# Patient Record
Sex: Male | Born: 1955 | Race: White | Hispanic: No | Marital: Married | Smoking: Never smoker
Health system: Southern US, Community
[De-identification: ages and names within clinical notes are randomized; demographics above are authoritative.]

## PROBLEM LIST (undated history)

## (undated) DIAGNOSIS — E785 Hyperlipidemia, unspecified: Secondary | ICD-10-CM

## (undated) DIAGNOSIS — M542 Cervicalgia: Secondary | ICD-10-CM

## (undated) DIAGNOSIS — K219 Gastro-esophageal reflux disease without esophagitis: Secondary | ICD-10-CM

## (undated) DIAGNOSIS — I200 Unstable angina: Secondary | ICD-10-CM

## (undated) DIAGNOSIS — M5136 Other intervertebral disc degeneration, lumbar region: Secondary | ICD-10-CM

## (undated) DIAGNOSIS — I2510 Atherosclerotic heart disease of native coronary artery without angina pectoris: Secondary | ICD-10-CM

## (undated) DIAGNOSIS — R079 Chest pain, unspecified: Secondary | ICD-10-CM

## (undated) DIAGNOSIS — R519 Headache, unspecified: Secondary | ICD-10-CM

## (undated) DIAGNOSIS — N182 Chronic kidney disease, stage 2 (mild): Secondary | ICD-10-CM

## (undated) DIAGNOSIS — I5031 Acute diastolic (congestive) heart failure: Secondary | ICD-10-CM

## (undated) DIAGNOSIS — I509 Heart failure, unspecified: Secondary | ICD-10-CM

## (undated) DIAGNOSIS — I5032 Chronic diastolic (congestive) heart failure: Secondary | ICD-10-CM

## (undated) DIAGNOSIS — K2970 Gastritis, unspecified, without bleeding: Secondary | ICD-10-CM

## (undated) DIAGNOSIS — M545 Low back pain, unspecified: Secondary | ICD-10-CM

## (undated) DIAGNOSIS — D510 Vitamin B12 deficiency anemia due to intrinsic factor deficiency: Secondary | ICD-10-CM

## (undated) DIAGNOSIS — M51369 Other intervertebral disc degeneration, lumbar region without mention of lumbar back pain or lower extremity pain: Secondary | ICD-10-CM

## (undated) DIAGNOSIS — R58 Hemorrhage, not elsewhere classified: Secondary | ICD-10-CM

## (undated) DIAGNOSIS — I480 Paroxysmal atrial fibrillation: Secondary | ICD-10-CM

## (undated) DIAGNOSIS — I10 Essential (primary) hypertension: Secondary | ICD-10-CM

## (undated) DIAGNOSIS — F411 Generalized anxiety disorder: Secondary | ICD-10-CM

## (undated) DIAGNOSIS — N189 Chronic kidney disease, unspecified: Secondary | ICD-10-CM

## (undated) DIAGNOSIS — E119 Type 2 diabetes mellitus without complications: Secondary | ICD-10-CM

## (undated) DIAGNOSIS — I219 Acute myocardial infarction, unspecified: Secondary | ICD-10-CM

## (undated) DIAGNOSIS — N200 Calculus of kidney: Secondary | ICD-10-CM

## (undated) DIAGNOSIS — Z6826 Body mass index (BMI) 26.0-26.9, adult: Secondary | ICD-10-CM

## (undated) DIAGNOSIS — Z87442 Personal history of urinary calculi: Secondary | ICD-10-CM

## (undated) HISTORY — DX: Hyperlipidemia, unspecified: E78.5

## (undated) HISTORY — DX: Acute diastolic (congestive) heart failure: I50.31

## (undated) HISTORY — PX: LUMBAR FUSION: SHX111

## (undated) HISTORY — DX: Atherosclerotic heart disease of native coronary artery without angina pectoris: I25.10

## (undated) HISTORY — DX: Body mass index (BMI) 26.0-26.9, adult: Z68.26

## (undated) HISTORY — PX: KNEE ARTHROSCOPY: SUR90

## (undated) HISTORY — DX: Calculus of kidney: N20.0

## (undated) HISTORY — DX: Paroxysmal atrial fibrillation: I48.0

## (undated) HISTORY — PX: CARDIAC CATHETERIZATION: SHX172

## (undated) HISTORY — DX: Vitamin B12 deficiency anemia due to intrinsic factor deficiency: D51.0

## (undated) HISTORY — DX: Chest pain, unspecified: R07.9

## (undated) HISTORY — DX: Gastritis, unspecified, without bleeding: K29.70

## (undated) HISTORY — DX: Type 2 diabetes mellitus without complications: E11.9

## (undated) HISTORY — DX: Chronic kidney disease, unspecified: N18.9

## (undated) HISTORY — DX: Unstable angina: I20.0

## (undated) HISTORY — DX: Other intervertebral disc degeneration, lumbar region: M51.36

## (undated) HISTORY — DX: Low back pain, unspecified: M54.50

## (undated) HISTORY — PX: CORONARY ARTERY BYPASS GRAFT: SHX141

## (undated) HISTORY — DX: Heart failure, unspecified: I50.9

## (undated) HISTORY — DX: Hemorrhage, not elsewhere classified: R58

## (undated) HISTORY — DX: Headache, unspecified: R51.9

## (undated) HISTORY — DX: Other intervertebral disc degeneration, lumbar region without mention of lumbar back pain or lower extremity pain: M51.369

## (undated) HISTORY — DX: Generalized anxiety disorder: F41.1

## (undated) HISTORY — PX: LUMBAR PERITONEAL SHUNT: SHX1984

## (undated) HISTORY — PX: SHOULDER ARTHROSCOPY: SHX128

## (undated) HISTORY — DX: Essential (primary) hypertension: I10

## (undated) HISTORY — DX: Cervicalgia: M54.2

---
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
## 1998-03-09 ENCOUNTER — Encounter: Payer: Self-pay | Admitting: Family Medicine

## 1998-03-09 ENCOUNTER — Ambulatory Visit (HOSPITAL_COMMUNITY): Admission: RE | Admit: 1998-03-09 | Discharge: 1998-03-09 | Payer: Self-pay | Admitting: Family Medicine

## 1998-06-07 ENCOUNTER — Encounter: Payer: Self-pay | Admitting: Emergency Medicine

## 1998-06-07 ENCOUNTER — Emergency Department (HOSPITAL_COMMUNITY): Admission: EM | Admit: 1998-06-07 | Discharge: 1998-06-07 | Payer: Self-pay | Admitting: Emergency Medicine

## 1998-06-12 ENCOUNTER — Emergency Department (HOSPITAL_COMMUNITY): Admission: EM | Admit: 1998-06-12 | Discharge: 1998-06-12 | Payer: Self-pay | Admitting: Emergency Medicine

## 1998-08-10 ENCOUNTER — Ambulatory Visit (HOSPITAL_COMMUNITY): Admission: RE | Admit: 1998-08-10 | Discharge: 1998-08-10 | Payer: Self-pay | Admitting: Neurosurgery

## 1998-08-10 ENCOUNTER — Encounter: Payer: Self-pay | Admitting: Neurosurgery

## 1998-08-19 ENCOUNTER — Encounter: Payer: Self-pay | Admitting: Emergency Medicine

## 1998-08-19 ENCOUNTER — Emergency Department (HOSPITAL_COMMUNITY): Admission: EM | Admit: 1998-08-19 | Discharge: 1998-08-20 | Payer: Self-pay | Admitting: Emergency Medicine

## 2000-03-20 ENCOUNTER — Encounter: Payer: Self-pay | Admitting: Emergency Medicine

## 2000-03-20 ENCOUNTER — Inpatient Hospital Stay (HOSPITAL_COMMUNITY): Admission: EM | Admit: 2000-03-20 | Discharge: 2000-03-22 | Payer: Self-pay | Admitting: Emergency Medicine

## 2000-03-28 ENCOUNTER — Encounter: Payer: Self-pay | Admitting: Emergency Medicine

## 2000-03-28 ENCOUNTER — Inpatient Hospital Stay (HOSPITAL_COMMUNITY): Admission: EM | Admit: 2000-03-28 | Discharge: 2000-03-30 | Payer: Self-pay | Admitting: Emergency Medicine

## 2000-04-10 ENCOUNTER — Encounter: Payer: Self-pay | Admitting: Emergency Medicine

## 2000-04-10 ENCOUNTER — Inpatient Hospital Stay (HOSPITAL_COMMUNITY): Admission: EM | Admit: 2000-04-10 | Discharge: 2000-04-13 | Payer: Self-pay | Admitting: Emergency Medicine

## 2000-04-11 ENCOUNTER — Encounter: Payer: Self-pay | Admitting: *Deleted

## 2000-04-12 ENCOUNTER — Encounter: Payer: Self-pay | Admitting: *Deleted

## 2000-05-24 ENCOUNTER — Inpatient Hospital Stay (HOSPITAL_COMMUNITY): Admission: EM | Admit: 2000-05-24 | Discharge: 2000-05-29 | Payer: Self-pay | Admitting: Emergency Medicine

## 2000-05-25 ENCOUNTER — Encounter: Payer: Self-pay | Admitting: Emergency Medicine

## 2000-05-26 ENCOUNTER — Encounter: Payer: Self-pay | Admitting: Cardiology

## 2000-05-29 ENCOUNTER — Encounter: Payer: Self-pay | Admitting: Interventional Cardiology

## 2000-07-07 ENCOUNTER — Encounter: Payer: Self-pay | Admitting: Emergency Medicine

## 2000-07-07 ENCOUNTER — Inpatient Hospital Stay (HOSPITAL_COMMUNITY): Admission: EM | Admit: 2000-07-07 | Discharge: 2000-07-14 | Payer: Self-pay | Admitting: Emergency Medicine

## 2000-07-08 ENCOUNTER — Encounter: Payer: Self-pay | Admitting: Thoracic Surgery (Cardiothoracic Vascular Surgery)

## 2000-07-09 ENCOUNTER — Encounter: Payer: Self-pay | Admitting: Thoracic Surgery (Cardiothoracic Vascular Surgery)

## 2000-07-10 ENCOUNTER — Encounter: Payer: Self-pay | Admitting: Thoracic Surgery (Cardiothoracic Vascular Surgery)

## 2000-07-11 ENCOUNTER — Encounter: Payer: Self-pay | Admitting: Thoracic Surgery (Cardiothoracic Vascular Surgery)

## 2000-11-28 ENCOUNTER — Encounter: Payer: Self-pay | Admitting: Emergency Medicine

## 2000-11-28 ENCOUNTER — Inpatient Hospital Stay (HOSPITAL_COMMUNITY): Admission: EM | Admit: 2000-11-28 | Discharge: 2000-12-04 | Payer: Self-pay | Admitting: Emergency Medicine

## 2001-01-12 ENCOUNTER — Encounter: Payer: Self-pay | Admitting: Emergency Medicine

## 2001-01-12 ENCOUNTER — Inpatient Hospital Stay (HOSPITAL_COMMUNITY): Admission: EM | Admit: 2001-01-12 | Discharge: 2001-01-14 | Payer: Self-pay | Admitting: Emergency Medicine

## 2001-03-05 ENCOUNTER — Emergency Department (HOSPITAL_COMMUNITY): Admission: EM | Admit: 2001-03-05 | Discharge: 2001-03-05 | Payer: Self-pay | Admitting: Emergency Medicine

## 2001-03-26 ENCOUNTER — Ambulatory Visit (HOSPITAL_COMMUNITY): Admission: RE | Admit: 2001-03-26 | Discharge: 2001-03-26 | Payer: Self-pay | Admitting: Family Medicine

## 2001-03-26 ENCOUNTER — Encounter: Payer: Self-pay | Admitting: Family Medicine

## 2001-04-25 ENCOUNTER — Encounter: Payer: Self-pay | Admitting: Emergency Medicine

## 2001-04-25 ENCOUNTER — Inpatient Hospital Stay (HOSPITAL_COMMUNITY): Admission: EM | Admit: 2001-04-25 | Discharge: 2001-04-29 | Payer: Self-pay

## 2001-04-27 ENCOUNTER — Encounter: Payer: Self-pay | Admitting: Cardiovascular Disease

## 2001-06-08 ENCOUNTER — Encounter: Payer: Self-pay | Admitting: Emergency Medicine

## 2001-06-09 ENCOUNTER — Inpatient Hospital Stay (HOSPITAL_COMMUNITY): Admission: EM | Admit: 2001-06-09 | Discharge: 2001-06-11 | Payer: Self-pay | Admitting: Emergency Medicine

## 2002-01-21 ENCOUNTER — Inpatient Hospital Stay (HOSPITAL_COMMUNITY): Admission: EM | Admit: 2002-01-21 | Discharge: 2002-01-23 | Payer: Self-pay | Admitting: Emergency Medicine

## 2002-01-21 ENCOUNTER — Encounter: Payer: Self-pay | Admitting: Interventional Cardiology

## 2002-02-10 ENCOUNTER — Encounter: Payer: Self-pay | Admitting: Interventional Cardiology

## 2002-02-10 ENCOUNTER — Inpatient Hospital Stay (HOSPITAL_COMMUNITY): Admission: EM | Admit: 2002-02-10 | Discharge: 2002-02-12 | Payer: Self-pay | Admitting: Emergency Medicine

## 2002-02-11 ENCOUNTER — Encounter: Payer: Self-pay | Admitting: Interventional Cardiology

## 2002-10-01 ENCOUNTER — Emergency Department (HOSPITAL_COMMUNITY): Admission: AD | Admit: 2002-10-01 | Discharge: 2002-10-01 | Payer: Self-pay | Admitting: Emergency Medicine

## 2002-10-01 ENCOUNTER — Encounter: Payer: Self-pay | Admitting: Emergency Medicine

## 2003-01-24 ENCOUNTER — Emergency Department (HOSPITAL_COMMUNITY): Admission: EM | Admit: 2003-01-24 | Discharge: 2003-01-24 | Payer: Self-pay | Admitting: Emergency Medicine

## 2003-01-27 ENCOUNTER — Emergency Department (HOSPITAL_COMMUNITY): Admission: EM | Admit: 2003-01-27 | Discharge: 2003-01-27 | Payer: Self-pay | Admitting: Emergency Medicine

## 2003-02-14 ENCOUNTER — Inpatient Hospital Stay (HOSPITAL_COMMUNITY): Admission: EM | Admit: 2003-02-14 | Discharge: 2003-02-15 | Payer: Self-pay | Admitting: Emergency Medicine

## 2003-04-09 ENCOUNTER — Inpatient Hospital Stay (HOSPITAL_COMMUNITY): Admission: EM | Admit: 2003-04-09 | Discharge: 2003-04-13 | Payer: Self-pay | Admitting: Emergency Medicine

## 2003-04-16 ENCOUNTER — Inpatient Hospital Stay (HOSPITAL_COMMUNITY): Admission: EM | Admit: 2003-04-16 | Discharge: 2003-04-20 | Payer: Self-pay | Admitting: *Deleted

## 2003-05-31 ENCOUNTER — Ambulatory Visit (HOSPITAL_COMMUNITY): Admission: RE | Admit: 2003-05-31 | Discharge: 2003-05-31 | Payer: Self-pay | Admitting: Endocrinology

## 2003-07-04 ENCOUNTER — Emergency Department (HOSPITAL_COMMUNITY): Admission: EM | Admit: 2003-07-04 | Discharge: 2003-07-04 | Payer: Self-pay | Admitting: Emergency Medicine

## 2005-01-21 IMAGING — CR DG CHEST 1V PORT
1 series · 1 of 1 positions shown · non-contrast
Comparison: 02/10/2002.

CLINICAL DATA: Chest pain.  
 CHEST, PORTABLE, ONE VIEW ? 02/14/2003

[view not recorded]
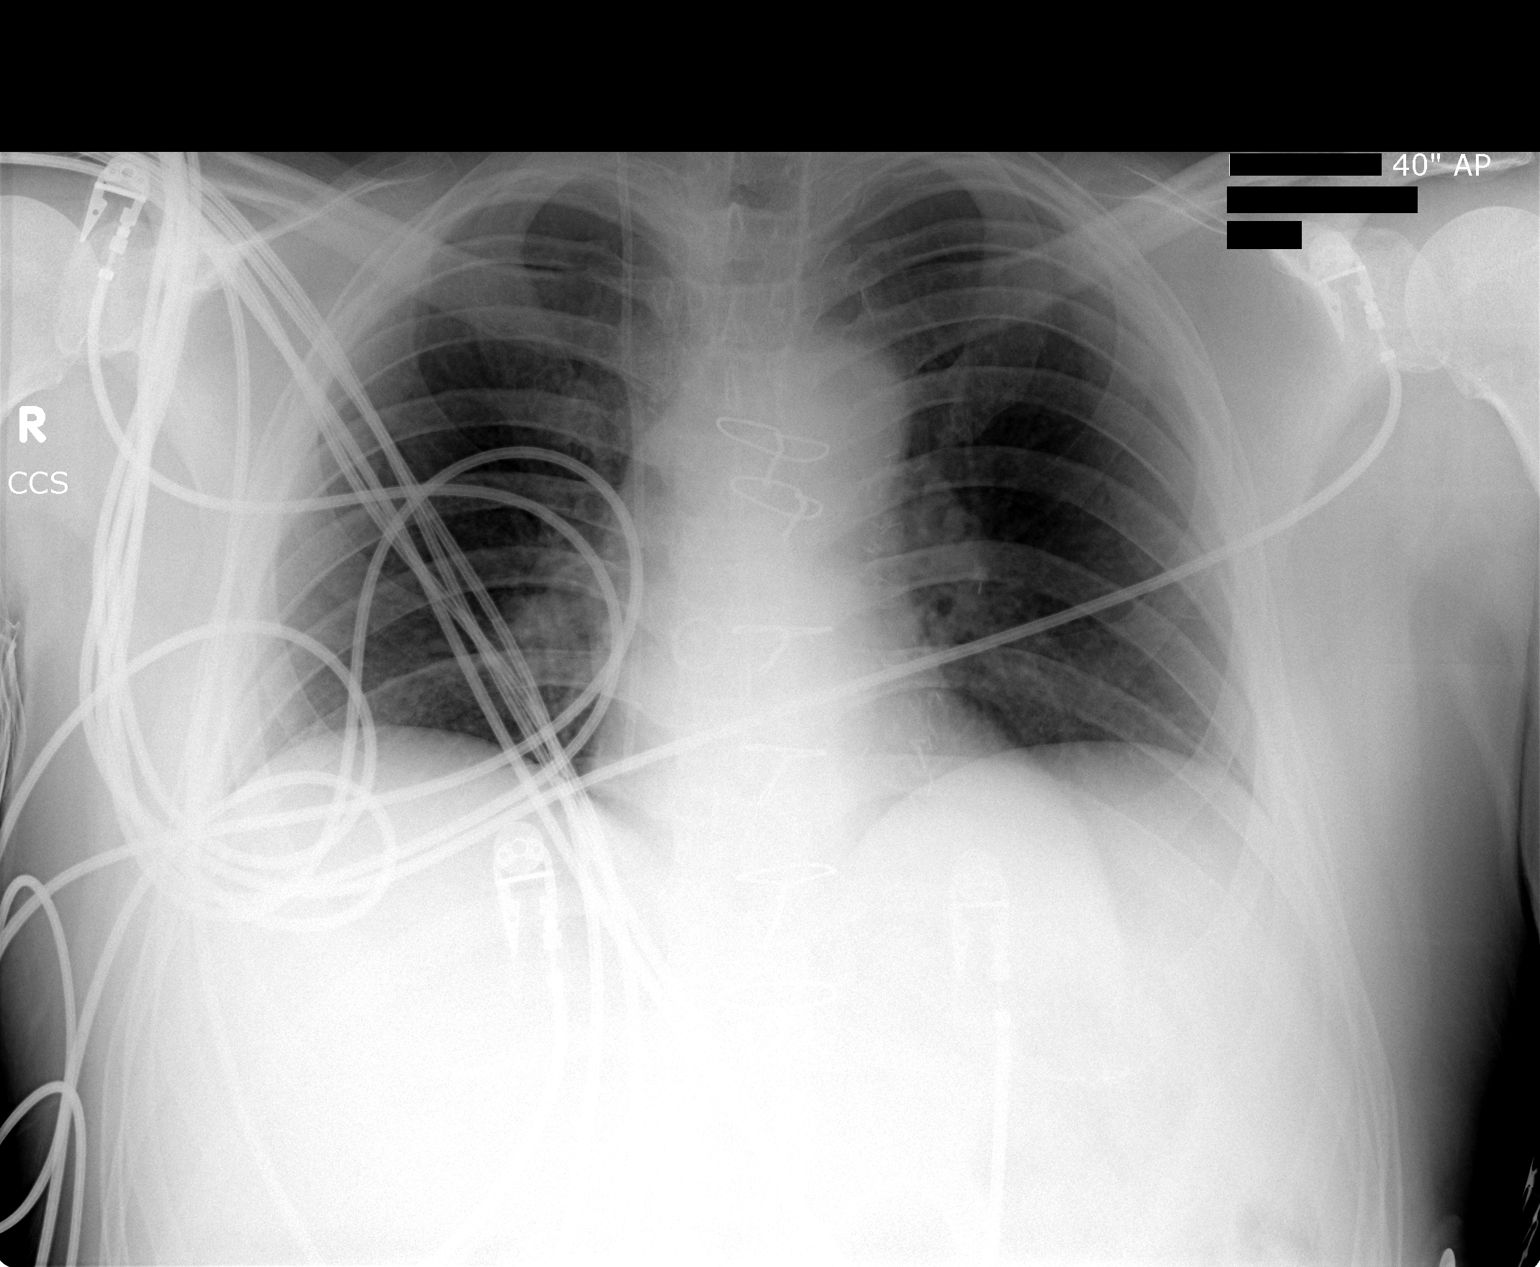

[1 of 1 positions shown; findings below may reference images not displayed]

Heart and mediastinal contours are within normal limits.  The patient is status post CABG.  Central line tip is in the upper right atrium.  Low lung volumes are noted without focal airspace opacity.
 IMPRESSION
 No acute disease.

## 2010-01-29 ENCOUNTER — Encounter: Payer: Self-pay | Admitting: Endocrinology

## 2012-03-12 DIAGNOSIS — I160 Hypertensive urgency: Secondary | ICD-10-CM

## 2012-03-12 HISTORY — DX: Hypertensive urgency: I16.0

## 2013-10-21 DIAGNOSIS — N201 Calculus of ureter: Secondary | ICD-10-CM | POA: Insufficient documentation

## 2015-01-09 HISTORY — PX: ABLATION: SHX5711

## 2015-01-22 DIAGNOSIS — Z79899 Other long term (current) drug therapy: Secondary | ICD-10-CM | POA: Insufficient documentation

## 2015-04-13 DIAGNOSIS — E861 Hypovolemia: Secondary | ICD-10-CM | POA: Insufficient documentation

## 2016-08-04 DIAGNOSIS — R509 Fever, unspecified: Secondary | ICD-10-CM | POA: Insufficient documentation

## 2016-08-04 DIAGNOSIS — Z8669 Personal history of other diseases of the nervous system and sense organs: Secondary | ICD-10-CM | POA: Insufficient documentation

## 2017-07-10 DIAGNOSIS — Z79899 Other long term (current) drug therapy: Secondary | ICD-10-CM | POA: Insufficient documentation

## 2017-07-10 DIAGNOSIS — M5126 Other intervertebral disc displacement, lumbar region: Secondary | ICD-10-CM | POA: Insufficient documentation

## 2018-01-14 DIAGNOSIS — M5116 Intervertebral disc disorders with radiculopathy, lumbar region: Secondary | ICD-10-CM | POA: Insufficient documentation

## 2018-03-31 ENCOUNTER — Telehealth: Payer: Self-pay

## 2018-03-31 NOTE — Telephone Encounter (Signed)
Notes on file.  

## 2018-06-24 ENCOUNTER — Telehealth: Payer: Self-pay | Admitting: *Deleted

## 2018-06-24 NOTE — Telephone Encounter (Signed)
REFERRAL SENT TO SCHEDULING AND NOTES ON FILE FROM [place] DR. MATTHEW JANIK [phone].

## 2018-09-09 NOTE — Progress Notes (Signed)
Cardiology Office Note:    Date:  09/10/2018   ID:  Terry Nelson, DOB 20-Jul-1955, MRN [id number]  PCP:  Jiles Harold, PA-C  Cardiologist:  No primary care provider on file.   Referring MD: Valentino Saxon, MD   Chief Complaint  Patient presents with  . Coronary Artery Disease    History of Present Illness:    Terry Nelson is a 63 y.o. male with a hx of CAD, CABG in 2002 by Dr. Dorris Fetch, PAF, Htn, HL, who is referred for cardiology evaluation and longitudinal management by Dr. Mosetta Anis.  Terry Nelson is here to establish with cardiology since he will be living both here and in [place].  I have seen him in the past prior to his move to [place].  He had coronary bypass surgery in [place] and subsequently has had multiple stents placed in [place].  His most recent heart catheterization performed in June did not lead to any intervention.  I do not have that data.  He is asymptomatic today.  He has not used nitroglycerin since June.  He denies orthopnea, PND, and claudication.  He has not had a stroke.  Terry Nelson had coronary bypass surgery in 2002 by Dr. Andrey Spearman and received a LIMA to the LAD, SVG to PDA, and SVG to OM.  Both grafts are occluded and the patient has had multiple stents to the LAD, most recently in 2017.  Other cardiac issues include paroxysmal atrial fibrillation (he is status post ablation in 2017), diastolic heart failure with most recent cardiac catheterization demonstrating LVEDP of 30, hyperlipidemia, and significant diabetes mellitus.  Past Medical History:  Diagnosis Date  . Anxiety state   . Arteriosclerosis of coronary artery   . Body mass index (bmi) 26.0-26.9, adult   . Chest pain   . CHF (congestive heart failure) (HCC)   . Chronic renal impairment   . DDD (degenerative disc disease), lumbar   . DM (diabetes mellitus) (HCC)   . Gastritis   . Headache   . Hemorrhage   . Hyperlipidemia   . Hypertension   . Kidney stones   . Lumbago   .  Neck pain   . Paroxysmal A-fib (HCC)   . Pernicious anemia   . Unstable angina [facility])     Past Surgical History:  Procedure Laterality Date  . ABLATION  2017  . CARDIAC CATHETERIZATION    . CORONARY ARTERY BYPASS GRAFT      Current Medications: Current Meds  Medication Sig  . AMLODIPINE BESYLATE PO Take 5 mg by mouth daily.  Marland Kitchen aspirin EC 81 MG tablet Take 81 mg by mouth daily.  Marland Kitchen atorvastatin (LIPITOR) 80 MG tablet Take 80 mg by mouth daily.  . furosemide (LASIX) 40 MG tablet 40 mg. 1/2 TABLET BY MOUTH DAILY  . gabapentin (NEURONTIN) 300 MG capsule Take 900 mg by mouth 2 (two) times daily.   Marland Kitchen glipiZIDE (GLUCOTROL XL) 10 MG 24 hr tablet Take 10 mg by mouth 2 (two) times daily.  . Insulin Glargine (BASAGLAR KWIKPEN [place]) Inject 24 Units/mL into the skin daily.   . isosorbide mononitrate (IMDUR) 60 MG 24 hr tablet Take 120 mg by mouth daily.  . magnesium oxide (MAG-OX) 400 MG tablet 400 mg. TAKE 2 TABLETS BY MOUTH THREE TIMES DAILY  . METOPROLOL TARTRATE PO Take 100 mg by mouth 3 (three) times daily.  . nitroGLYCERIN (NITROSTAT) 0.4 MG SL tablet Place 0.4 mg under the tongue every 5 (five) minutes as needed for chest pain.  Marland Kitchen  oxyCODONE-acetaminophen (PERCOCET) 10-325 MG tablet Take 1 tablet by mouth every 4 (four) hours as needed for pain.  . pantoprazole (PROTONIX) 40 MG tablet Take 40 mg by mouth daily.  . prasugrel (EFFIENT) 10 MG TABS tablet Take 10 mg by mouth daily.  . promethazine (PHENERGAN) 25 MG tablet Take 25 mg by mouth 2 (two) times daily as needed for nausea or vomiting.  Marland Kitchen spironolactone (ALDACTONE) 25 MG tablet Take 25 mg by mouth daily.  Marland Kitchen tiZANidine (ZANAFLEX) 4 MG tablet Take 4 mg by mouth every 6 (six) hours as needed for muscle spasms.     Allergies:   Metoclopramide and Penicillins   Social History   Socioeconomic History  . Marital status: Married    Spouse name: Not on file  . Number of children: Not on file  . Years of education: Not on file  . Highest  education level: Not on file  Occupational History  . Not on file  Social Needs  . Financial resource strain: Not on file  . Food insecurity    Worry: Not on file    Inability: Not on file  . Transportation needs    Medical: Not on file    Non-medical: Not on file  Tobacco Use  . Smoking status: Never Smoker  . Smokeless tobacco: Never Used  Substance and Sexual Activity  . Alcohol use: Not on file  . Drug use: Not on file  . Sexual activity: Not on file    Comment: MARRIED  Lifestyle  . Physical activity    Days per week: Not on file    Minutes per session: Not on file  . Stress: Not on file  Relationships  . Social Herbalist on phone: Not on file    Gets together: Not on file    Attends religious service: Not on file    Active member of club or organization: Not on file    Attends meetings of clubs or organizations: Not on file    Relationship status: Not on file  Other Topics Concern  . Not on file  Social History Narrative  . Not on file     Family History: The patient's family history includes Cancer - Other in his mother; Diabetes in his father and mother; Heart disease in an other family member.  ROS:   Please see the history of present illness.    Partially compliant with therapy.  Does not smoke.  Anxiety and depression.  All other systems reviewed and are negative.  EKGs/Labs/Other Studies Reviewed:    The following studies were reviewed today: Review of data from [facility] follows: We will review voluminous records and record pertinent data.  Internal loop recorder:  Coronary angiogram June 2020: Coronary Angiography: The left main coronary artery was large in caliber and trifurcated into the LAD and LCX and RI.<20% stenosis, diffuse plaque.  The LAD gave rise to 2 small diagonals across the anterolateral wall. Long stented segment (many stents) from proximal to apical LAD. Diffuse mild-moderate in-stent LAD re-stenosis. Apical segment  that underwent PCI with cutting balloon 04/2017 remains patent. Distal stent edge 70% focal stenosis, this is a <2.76mm vessel at the apex.UNCHANGED vs IMPROVED.  The LCX gave rise to 3 OM branches across the lateral wall. OM1 small vessel with diffuse up to 90% stenosis with TIMI 1 flow. OM2 and OM3 with proximal 70-80% lesions and competitive flow from SVG.  Very small caliber RI with diffuse moderate disease.  The RCA  was a dominant artery and supplied both the PDA and PLB. Diffuse up to 40-50% disease throughout with patent PDA stents.  SVG to OM1 to OM2: widely patent good runoff.   LIMA previously shown to be atretic    EKG:  EKG sinus rhythm, short PR interval, inferior Q waves.  Nonspecific T wave flattening.  Recent Labs: No results found for requested labs within last 8760 hours.  Recent Lipid Panel No results found for: CHOL, TRIG, HDL, CHOLHDL, VLDL, LDLCALC, LDLDIRECT  Physical Exam:    VS:  BP (!) 152/82   Pulse 68   Ht 5\' 7"  (1.702 m)   Wt 162 lb 12.8 oz (73.8 kg)   SpO2 96%   BMI 25.50 kg/m     Wt Readings from Last 3 Encounters:  09/10/18 162 lb 12.8 oz (73.8 kg)     GEN: Slender, mast.. No acute distress HEENT: Normal NECK: No JVD. LYMPHATICS: No lymphadenopathy CARDIAC:  RRR without murmur, gallop, or edema. VASCULAR:  Normal Pulses. No bruits. RESPIRATORY:  Clear to auscultation without rales, wheezing or rhonchi  ABDOMEN: Soft, non-tender, non-distended, No pulsatile mass, MUSCULOSKELETAL: No deformity  SKIN: Warm and dry NEUROLOGIC:  Alert and oriented x 3 PSYCHIATRIC:  Normal affect   ASSESSMENT:    1. Coronary artery disease involving native coronary artery of native heart without angina pectoris   2. PAF (paroxysmal atrial fibrillation) (HCC)   3. Essential hypertension   4. Hyperlipidemia LDL goal <70   5. Chest pain of uncertain etiology   6. Educated About Covid-19 Virus Infection    PLAN:    In order of problems listed above:   1. Complex cardiovascular history with multivessel bypass and subsequent multiple stent implantation.  Secondary risk prevention discussed in detail. 2. Prior history of A. fib.  No clinical recurrences 3. Blood pressure is elevated today.  He feels this is a combination of anxiety and stress related to coming into our office.  He does better than this at home.  Target blood pressure less than 130/80 mmHg. 4. Target LDL less than 70. 5. No recent chest pain episodes.  Most recent cath June 2020. 6. Social distancing, masking, and handwashing.   Overall education and awareness concerning primary/secondary risk prevention was discussed in detail: LDL less than 70, hemoglobin A1c less than 7, blood pressure target less than 130/80 mmHg, >150 minutes of moderate aerobic activity per week, avoidance of smoking, weight control (via diet and exercise), and continued surveillance/management of/for obstructive sleep apnea.    Medication Adjustments/Labs and Tests Ordered: Current medicines are reviewed at length with the patient today.  Concerns regarding medicines are outlined above.  Orders Placed This Encounter  Procedures  . EKG 12-Lead   No orders of the defined types were placed in this encounter.   There are no Patient Instructions on file for this visit.   Signed, Lesleigh NoeHenry W Emalie Mcwethy III, MD  09/10/2018 2:59 PM    [facility]

## 2018-09-10 ENCOUNTER — Encounter: Payer: Self-pay | Admitting: Interventional Cardiology

## 2018-09-10 ENCOUNTER — Ambulatory Visit (INDEPENDENT_AMBULATORY_CARE_PROVIDER_SITE_OTHER): Payer: 59 | Admitting: Interventional Cardiology

## 2018-09-10 ENCOUNTER — Encounter (INDEPENDENT_AMBULATORY_CARE_PROVIDER_SITE_OTHER): Payer: Self-pay

## 2018-09-10 ENCOUNTER — Other Ambulatory Visit: Payer: Self-pay

## 2018-09-10 VITALS — BP 152/82 | HR 68 | Ht 67.0 in | Wt 162.8 lb

## 2018-09-10 DIAGNOSIS — I480 Paroxysmal atrial fibrillation: Secondary | ICD-10-CM

## 2018-09-10 DIAGNOSIS — R079 Chest pain, unspecified: Secondary | ICD-10-CM

## 2018-09-10 DIAGNOSIS — Z7189 Other specified counseling: Secondary | ICD-10-CM

## 2018-09-10 DIAGNOSIS — R0789 Other chest pain: Secondary | ICD-10-CM

## 2018-09-10 DIAGNOSIS — I2510 Atherosclerotic heart disease of native coronary artery without angina pectoris: Secondary | ICD-10-CM

## 2018-09-10 DIAGNOSIS — I10 Essential (primary) hypertension: Secondary | ICD-10-CM

## 2018-09-10 DIAGNOSIS — E785 Hyperlipidemia, unspecified: Secondary | ICD-10-CM

## 2018-09-10 NOTE — Patient Instructions (Signed)
Medication Instructions:  Your physician recommends that you continue on your current medications as directed. Please refer to the Current Medication list given to you today.  If you need a refill on your cardiac medications before your next appointment, please call your pharmacy.   Lab work: None If you have labs (blood work) drawn today and your tests are completely normal, you will receive your results only by: . MyChart Message (if you have MyChart) OR . A paper copy in the mail If you have any lab test that is abnormal or we need to change your treatment, we will call you to review the results.  Testing/Procedures: None  Follow-Up: At [facility], you and your health needs are our priority.  As part of our continuing mission to provide you with exceptional heart care, we have created designated Provider Care Teams.  These Care Teams include your primary Cardiologist (physician) and Advanced Practice Providers (APPs -  Physician Assistants and Nurse Practitioners) who all work together to provide you with the care you need, when you need it. You will need a follow up appointment in 6 months.  Please call our office 2 months in advance to schedule this appointment.  You may see Dr. Smith or one of the following Advanced Practice Providers on your designated Care Team:   Lori Gerhardt, NP Laura Ingold, NP . Jill McDaniel, NP  Any Other Special Instructions Will Be Listed Below (If Applicable).    

## 2019-03-24 ENCOUNTER — Telehealth: Payer: Self-pay | Admitting: Interventional Cardiology

## 2019-03-24 NOTE — Telephone Encounter (Signed)
I spoke to the patient who called because he is experiencing CP, left arm pain and back pain.  He has apparently taken 6-8 Nitro pills with no relief.  I educated him on the proper protocol for Nitro and told him to go to the ED for further evaluation.  He verbalized understanding and has an appointment with Dr Katrinka Blazing on 03/30/19.

## 2019-03-24 NOTE — Telephone Encounter (Signed)
Pt c/o of Chest Pain: STAT if CP now or developed within 24 hours  1. Are you having CP right now? Yes but not terrible right this second.   2. Are you experiencing any other symptoms (ex. SOB, nausea, vomiting, sweating)? Nausea and states that BP a few nights ago was outrageous. Took an extra half of AMLODIPINE BESYLATE PO. States BP before daylight today was around 160-170/98. Pain in left arm and into his back.   3. How long have you been experiencing CP? Off and on for a couple of weeks.   4. Is your CP continuous or coming and going? Coming and going but when it comes it stays for a while.   5. Have you taken Nitroglycerin? Yes, states he has taken several. Between 6-8 ?

## 2019-03-30 ENCOUNTER — Ambulatory Visit (INDEPENDENT_AMBULATORY_CARE_PROVIDER_SITE_OTHER): Payer: PRIVATE HEALTH INSURANCE | Admitting: Interventional Cardiology

## 2019-03-30 ENCOUNTER — Encounter: Payer: Self-pay | Admitting: Interventional Cardiology

## 2019-03-30 ENCOUNTER — Other Ambulatory Visit: Payer: Self-pay

## 2019-03-30 VITALS — BP 102/64 | HR 53 | Ht 67.0 in | Wt 161.8 lb

## 2019-03-30 DIAGNOSIS — I480 Paroxysmal atrial fibrillation: Secondary | ICD-10-CM

## 2019-03-30 DIAGNOSIS — I10 Essential (primary) hypertension: Secondary | ICD-10-CM

## 2019-03-30 DIAGNOSIS — I25110 Atherosclerotic heart disease of native coronary artery with unstable angina pectoris: Secondary | ICD-10-CM

## 2019-03-30 DIAGNOSIS — E785 Hyperlipidemia, unspecified: Secondary | ICD-10-CM | POA: Diagnosis not present

## 2019-03-30 DIAGNOSIS — R937 Abnormal findings on diagnostic imaging of other parts of musculoskeletal system: Secondary | ICD-10-CM

## 2019-03-30 DIAGNOSIS — Z7189 Other specified counseling: Secondary | ICD-10-CM

## 2019-03-30 NOTE — H&P (View-Only) (Signed)
Cardiology Office Note:    Date:  03/30/2019   ID:  Terry Nelson, DOB [birth year], MRN [id number]  PCP:  Jiles Harold, PA-C  Cardiologist:  Lesleigh Noe, MD   Referring MD: Jiles Harold, PA-C   Chief Complaint  Patient presents with  . Chest Pain  . Coronary Artery Disease    History of Present Illness:    Terry Nelson is a 64 y.o. male with a hx of CAD, CABG in 2002 by Dr. Dorris Fetch, PAF, Htn, HL, who is referred for cardiology evaluation and here for 6 month f/u.  Michele is here to establish with cardiology since he will be living both here and in [place].  I have seen him in the past prior to his move to [place].  He had coronary bypass surgery in [place] and subsequently has had multiple stents placed in [place].  His most recent heart catheterization performed in June did not lead to any intervention.  I do not have that data.  He is asymptomatic today.  He has not used nitroglycerin since June.  He denies orthopnea, PND, and claudication.  He has not had a stroke.  Ollis had coronary bypass surgery in 2002 by Dr. Andrey Spearman and received a LIMA to the LAD, SVG to PDA, and SVG to OM.  Both grafts are occluded and the patient has had multiple stents to the LAD, most recently in 2017.  Other cardiac issues include paroxysmal atrial fibrillation (he is status post ablation in 2017), diastolic heart failure with most recent cardiac catheterization demonstrating LVEDP of 30, hyperlipidemia, and significant diabetes mellitus.  Since the last OV and particularly over past months having increasing episodes of pain at reswt. Requiring multiple SL NTG for relief.  Past Medical History:  Diagnosis Date  . Anxiety state   . Arteriosclerosis of coronary artery   . Body mass index (bmi) 26.0-26.9, adult   . Chest pain   . CHF (congestive heart failure) (HCC)   . Chronic renal impairment   . DDD (degenerative disc disease), lumbar   . DM (diabetes  mellitus) (HCC)   . Gastritis   . Headache   . Hemorrhage   . Hyperlipidemia   . Hypertension   . Kidney stones   . Lumbago   . Neck pain   . Paroxysmal A-fib (HCC)   . Pernicious anemia   . Unstable angina [facility])     Past Surgical History:  Procedure Laterality Date  . ABLATION  2017  . CARDIAC CATHETERIZATION    . CORONARY ARTERY BYPASS GRAFT      Current Medications: Current Meds  Medication Sig  . amLODipine (NORVASC) 5 MG tablet Take 5 mg by mouth daily.   Marland Kitchen aspirin EC 81 MG tablet Take 81 mg by mouth daily.  Marland Kitchen atorvastatin (LIPITOR) 80 MG tablet Take 80 mg by mouth daily.  . furosemide (LASIX) 40 MG tablet 40 mg. 1/2 TABLET BY MOUTH DAILY  . gabapentin (NEURONTIN) 300 MG capsule Take 900 mg by mouth 2 (two) times daily.   Marland Kitchen glipiZIDE (GLUCOTROL XL) 10 MG 24 hr tablet Take 10 mg by mouth 2 (two) times daily.  . isosorbide mononitrate (IMDUR) 60 MG 24 hr tablet Take 120 mg by mouth daily.  . magnesium oxide (MAG-OX) 400 MG tablet 400 mg. TAKE 2 TABLETS BY MOUTH THREE TIMES DAILY  . METOPROLOL TARTRATE PO Take 100 mg by mouth 3 (three) times daily.  . nitroGLYCERIN (NITROSTAT) 0.4 MG SL tablet Place  0.4 mg under the tongue every 5 (five) minutes as needed for chest pain.  Marland Kitchen oxyCODONE-acetaminophen (PERCOCET) 10-325 MG tablet Take 1 tablet by mouth every 4 (four) hours as needed for pain.  . pantoprazole (PROTONIX) 40 MG tablet Take 40 mg by mouth daily.  . prasugrel (EFFIENT) 10 MG TABS tablet Take 10 mg by mouth daily.  . promethazine (PHENERGAN) 25 MG tablet Take 25 mg by mouth 2 (two) times daily as needed for nausea or vomiting.  Marland Kitchen spironolactone (ALDACTONE) 25 MG tablet Take 25 mg by mouth daily.  Marland Kitchen tiZANidine (ZANAFLEX) 4 MG tablet Take 4 mg by mouth every 6 (six) hours as needed for muscle spasms.  Nelva Nay SOLOSTAR 300 UNIT/ML Solostar Pen Inject 26 Units into the skin daily.     Allergies:   Metoclopramide and Penicillins   Social History   Socioeconomic  History  . Marital status: Married    Spouse name: Not on file  . Number of children: Not on file  . Years of education: Not on file  . Highest education level: Not on file  Occupational History  . Not on file  Tobacco Use  . Smoking status: Never Smoker  . Smokeless tobacco: Never Used  Substance and Sexual Activity  . Alcohol use: Not on file  . Drug use: Not on file  . Sexual activity: Not on file    Comment: MARRIED  Other Topics Concern  . Not on file  Social History Narrative  . Not on file   Social Determinants of Health   Financial Resource Strain:   . Difficulty of Paying Living Expenses:   Food Insecurity:   . Worried About Charity fundraiser in the Last Year:   . Arboriculturist in the Last Year:   Transportation Needs:   . Film/video editor (Medical):   Marland Kitchen Lack of Transportation (Non-Medical):   Physical Activity:   . Days of Exercise per Week:   . Minutes of Exercise per Session:   Stress:   . Feeling of Stress :   Social Connections:   . Frequency of Communication with Friends and Family:   . Frequency of Social Gatherings with Friends and Family:   . Attends Religious Services:   . Active Member of Clubs or Organizations:   . Attends Archivist Meetings:   Marland Kitchen Marital Status:      Family History: The patient's family history includes Cancer - Other in his mother; Diabetes in his father and mother; Heart disease in an other family member.  ROS:   Please see the history of present illness.    Is able to ambulate without precipitating chest discomfort.  He denies orthopnea, PND, lower extremity swelling.  He has not had blood in his urine or stool.  He is not short of breath.  He has not had syncope or palpitations suggestive of atrial fibrillation.  No neurological complaints.  All other systems reviewed and are negative.  EKGs/Labs/Other Studies Reviewed:    The following studies were reviewed today: Cardiac catheter in 2000: Done in  [place] at [facility]:    Coronary angiogram June 2020: Coronary Angiography: The left main coronary artery was large in caliber and trifurcated into the LAD and LCX and RI.<20% stenosis, diffuse plaque.  The LAD gave rise to 2 small diagonals across the anterolateral wall. Long stented segment (many stents) from proximal to apical LAD. Diffuse mild-moderate in-stent LAD re-stenosis. Apical segment that underwent PCI with cutting  balloon 04/2017 remains patent. Distal stent edge 70% focal stenosis, this is a <2.0mm vessel at the apex.UNCHANGED vs IMPROVED.  The LCX gave rise to 3 OM branches across the lateral wall. OM1 small vessel with diffuse up to 90% stenosis with TIMI 1 flow. OM2 and OM3 with proximal 70-80% lesions and competitive flow from SVG.  Very small caliber RI with diffuse moderate disease.  The RCA was a dominant artery and supplied both the PDA and PLB. Diffuse up to 40-50% disease throughout with patent PDA stents.  SVG to OM1 to OM2: widely patent good runoff.   LIMA previously shown to be atretic  EKG:  EKG demonstrates sinus bradycardia, rate 54, nonspecific T wave abnormality, small inferior Q waves.  Lateral precordial T wave abnormality.  No acute change compared to prior tracing performed 09/11/2018.  Recent Labs: No results found for requested labs within last 8760 hours.  Recent Lipid Panel No results found for: CHOL, TRIG, HDL, CHOLHDL, VLDL, LDLCALC, LDLDIRECT  Physical Exam:    VS:  BP 102/64   Pulse (!) 53   Ht 5' 7" (1.702 m)   Wt 161 lb 12.8 oz (73.4 kg)   SpO2 98%   BMI 25.34 kg/m     Wt Readings from Last 3 Encounters:  03/30/19 161 lb 12.8 oz (73.4 kg)  09/10/18 162 lb 12.8 oz (73.8 kg)     GEN: Appears compensated and not having chest discomfort.. No acute distress HEENT: Normal NECK: No JVD. LYMPHATICS: No lymphadenopathy CARDIAC:  RRR without murmur, gallop, or edema. VASCULAR:  Normal Pulses. No bruits. RESPIRATORY:   Clear to auscultation without rales, wheezing or rhonchi  ABDOMEN: Soft, non-tender, non-distended, No pulsatile mass, MUSCULOSKELETAL: No deformity  SKIN: Warm and dry NEUROLOGIC:  Alert and oriented x 3 PSYCHIATRIC:  Normal affect   ASSESSMENT:    1. Coronary artery disease involving native coronary artery of native heart with unstable angina pectoris (HCC)   2. PAF (paroxysmal atrial fibrillation) (HCC)   3. Essential hypertension   4. Hyperlipidemia LDL goal <70   5. Educated about COVID-19 virus infection   6. Lipid accumulation in muscle determined by magnetic resonance imaging    PLAN:    In order of problems listed above:  1. With the patient now having episodes of discomfort at rest, needs repeat coronary angiography.  Last procedure was approximately 9 months ago.  He has had multiple stent implantations involving his grass and native vessel since his bypass surgery.  LV function is known to be normal.  We will try to obtain digital images from [place] from the 2020 catheterization.  We will have Jennifer attempt to get those by the time we do the heart catheterization. 2. He does not feel that he has had any episodes of atrial fibrillation. 3. Has noticed extremely high blood pressures during episodes of pain but states that the pain usually raises the blood pressure.  Today's blood pressure is excellent. 4. On maximal therapy for lipid lowering.  We have no recent laboratory data.  We will get laboratory data today. 5. COVID-19 vaccine is desired.  Has just not been able to get it scheduled.  Practicing social distancing.  The patient was counseled to undergo left heart catheterization, coronary angiography, and possible percutaneous coronary intervention with stent implantation. The procedural risks and benefits were discussed in detail. The risks discussed included death, stroke, myocardial infarction, life-threatening bleeding, limb ischemia, kidney injury, allergy, and  possible emergency cardiac surgery. The risk   of these significant complications were estimated to occur less than 1% of the time. After discussion, the patient has agreed to proceed.    Medication Adjustments/Labs and Tests Ordered: Current medicines are reviewed at length with the patient today.  Concerns regarding medicines are outlined above.  Orders Placed This Encounter  Procedures  . Basic metabolic panel  . CBC  . Lipid panel  . Hepatic function panel  . HgB A1c  . EKG 12-Lead   No orders of the defined types were placed in this encounter.   Patient Instructions  Medication Instructions:  Your physician recommends that you continue on your current medications as directed. Please refer to the Current Medication list given to you today.  *If you need a refill on your cardiac medications before your next appointment, please call your pharmacy*   Lab Work: BMET, Lipid, Liver, A1C and CBC today  If you have labs (blood work) drawn today and your tests are completely normal, you will receive your results only by: Marland Kitchen MyChart Message (if you have MyChart) OR . A paper copy in the mail If you have any lab test that is abnormal or we need to change your treatment, we will call you to review the results.   Testing/Procedures: Your physician has requested that you have a cardiac catheterization. Cardiac catheterization is used to diagnose and/or treat various heart conditions. Doctors may recommend this procedure for a number of different reasons. The most common reason is to evaluate chest pain. Chest pain can be a symptom of coronary artery disease (CAD), and cardiac catheterization can show whether plaque is narrowing or blocking your heart's arteries. This procedure is also used to evaluate the valves, as well as measure the blood flow and oxygen levels in different parts of your heart. For further information please visit [email or web address] Please follow instruction sheet, as  given.    Follow-Up: At [facility], you and your health needs are our priority.  As part of our continuing mission to provide you with exceptional heart care, we have created designated Provider Care Teams.  These Care Teams include your primary Cardiologist (physician) and Advanced Practice Providers (APPs -  Physician Assistants and Nurse Practitioners) who all work together to provide you with the care you need, when you need it.  We recommend signing up for the patient portal called "MyChart".  Sign up information is provided on this After Visit Summary.  MyChart is used to connect with patients for Virtual Visits (Telemedicine).  Patients are able to view lab/test results, encounter notes, upcoming appointments, etc.  Non-urgent messages can be sent to your provider as well.   To learn more about what you can do with MyChart, go to [email or web address].    Your next appointment:   2-3 week(s)  The format for your next appointment:   In Person  Provider:   You may see Lesleigh Noe, MD or one of the following Advanced Practice Providers on your designated Care Team:    Norma Fredrickson, NP  Nada Boozer, NP  Georgie Chard, NP    Other Instructions  COVID SCREENING INFORMATION: You are scheduled for your COVID screening on ______ at _______. [employer] (old [facility]) [address] Stay in the RIGHT lane and proceed under the brick awning (NOT the tent) and tell them you are there for pre-procedure testing Do NOT bring any pets with you to the testing site     [facility]  CARDIOVASCULAR DIVISION [facility] ST OFFICE [address] [place] [address] Dept: [phone] Loc: [phone]  LAJARVIS ITALIANO  03/30/2019  You are scheduled for a Cardiac Catheterization on Thursday, April 1 with Dr. Verdis Prime.  1. Please arrive at the [facility] (Main Entrance A) at [facility]: [address] [place], [place] [address] at 8:30 AM (This time is two hours before your procedure to ensure your preparation). Free valet parking service is available.   Special note: Every effort is made to have your procedure done on time. Please understand that emergencies sometimes delay scheduled procedures.  2. Diet: Do not eat solid foods after midnight.  The patient may have clear liquids until 5am upon the day of the procedure.  3. Labs: You will need have labs drawn today  4. Medication instructions in preparation for your procedure:   Contrast Allergy: No  You will need to hold your Furosemide, Glipizide, Spironolactone, and Toujeo the morning of your procedure.   On the morning of your procedure, take your Effient/Prasugrel and any morning medicines NOT listed above.  You may use sips of water.  5. Plan for one night stay--bring personal belongings. 6. Bring a current list of your medications and current insurance cards. 7. You MUST have a responsible person to drive you home. 8. Someone MUST be with you the first 24 hours after you arrive home or your discharge will be delayed. 9. Please wear clothes that are easy to get on and off and wear slip-on shoes.  Thank you for allowing [place] to care for you!   -- [facility]     Signed, Lesleigh Noe, MD  03/30/2019 1:00 PM    [facility]

## 2019-03-30 NOTE — Progress Notes (Addendum)
Cardiology Office Note:    Date:  03/30/2019   ID:  Terry Nelson, DOB [birth year], MRN [id number]  PCP:  Jiles Harold, PA-C  Cardiologist:  Lesleigh Noe, MD   Referring MD: Jiles Harold, PA-C   Chief Complaint  Patient presents with  . Chest Pain  . Coronary Artery Disease    History of Present Illness:    Terry Nelson is a 64 y.o. male with a hx of CAD, CABG in 2002 by Dr. Dorris Fetch, PAF, Htn, HL, who is referred for cardiology evaluation and here for 6 month f/u.  Michele is here to establish with cardiology since he will be living both here and in [place].  I have seen him in the past prior to his move to [place].  He had coronary bypass surgery in [place] and subsequently has had multiple stents placed in [place].  His most recent heart catheterization performed in June did not lead to any intervention.  I do not have that data.  He is asymptomatic today.  He has not used nitroglycerin since June.  He denies orthopnea, PND, and claudication.  He has not had a stroke.  Ollis had coronary bypass surgery in 2002 by Dr. Andrey Spearman and received a LIMA to the LAD, SVG to PDA, and SVG to OM.  Both grafts are occluded and the patient has had multiple stents to the LAD, most recently in 2017.  Other cardiac issues include paroxysmal atrial fibrillation (he is status post ablation in 2017), diastolic heart failure with most recent cardiac catheterization demonstrating LVEDP of 30, hyperlipidemia, and significant diabetes mellitus.  Since the last OV and particularly over past months having increasing episodes of pain at reswt. Requiring multiple SL NTG for relief.  Past Medical History:  Diagnosis Date  . Anxiety state   . Arteriosclerosis of coronary artery   . Body mass index (bmi) 26.0-26.9, adult   . Chest pain   . CHF (congestive heart failure) (HCC)   . Chronic renal impairment   . DDD (degenerative disc disease), lumbar   . DM (diabetes  mellitus) (HCC)   . Gastritis   . Headache   . Hemorrhage   . Hyperlipidemia   . Hypertension   . Kidney stones   . Lumbago   . Neck pain   . Paroxysmal A-fib (HCC)   . Pernicious anemia   . Unstable angina [facility])     Past Surgical History:  Procedure Laterality Date  . ABLATION  2017  . CARDIAC CATHETERIZATION    . CORONARY ARTERY BYPASS GRAFT      Current Medications: Current Meds  Medication Sig  . amLODipine (NORVASC) 5 MG tablet Take 5 mg by mouth daily.   Marland Kitchen aspirin EC 81 MG tablet Take 81 mg by mouth daily.  Marland Kitchen atorvastatin (LIPITOR) 80 MG tablet Take 80 mg by mouth daily.  . furosemide (LASIX) 40 MG tablet 40 mg. 1/2 TABLET BY MOUTH DAILY  . gabapentin (NEURONTIN) 300 MG capsule Take 900 mg by mouth 2 (two) times daily.   Marland Kitchen glipiZIDE (GLUCOTROL XL) 10 MG 24 hr tablet Take 10 mg by mouth 2 (two) times daily.  . isosorbide mononitrate (IMDUR) 60 MG 24 hr tablet Take 120 mg by mouth daily.  . magnesium oxide (MAG-OX) 400 MG tablet 400 mg. TAKE 2 TABLETS BY MOUTH THREE TIMES DAILY  . METOPROLOL TARTRATE PO Take 100 mg by mouth 3 (three) times daily.  . nitroGLYCERIN (NITROSTAT) 0.4 MG SL tablet Place  0.4 mg under the tongue every 5 (five) minutes as needed for chest pain.  Marland Kitchen oxyCODONE-acetaminophen (PERCOCET) 10-325 MG tablet Take 1 tablet by mouth every 4 (four) hours as needed for pain.  . pantoprazole (PROTONIX) 40 MG tablet Take 40 mg by mouth daily.  . prasugrel (EFFIENT) 10 MG TABS tablet Take 10 mg by mouth daily.  . promethazine (PHENERGAN) 25 MG tablet Take 25 mg by mouth 2 (two) times daily as needed for nausea or vomiting.  Marland Kitchen spironolactone (ALDACTONE) 25 MG tablet Take 25 mg by mouth daily.  Marland Kitchen tiZANidine (ZANAFLEX) 4 MG tablet Take 4 mg by mouth every 6 (six) hours as needed for muscle spasms.  Nelva Nay SOLOSTAR 300 UNIT/ML Solostar Pen Inject 26 Units into the skin daily.     Allergies:   Metoclopramide and Penicillins   Social History   Socioeconomic  History  . Marital status: Married    Spouse name: Not on file  . Number of children: Not on file  . Years of education: Not on file  . Highest education level: Not on file  Occupational History  . Not on file  Tobacco Use  . Smoking status: Never Smoker  . Smokeless tobacco: Never Used  Substance and Sexual Activity  . Alcohol use: Not on file  . Drug use: Not on file  . Sexual activity: Not on file    Comment: MARRIED  Other Topics Concern  . Not on file  Social History Narrative  . Not on file   Social Determinants of Health   Financial Resource Strain:   . Difficulty of Paying Living Expenses:   Food Insecurity:   . Worried About Charity fundraiser in the Last Year:   . Arboriculturist in the Last Year:   Transportation Needs:   . Film/video editor (Medical):   Marland Kitchen Lack of Transportation (Non-Medical):   Physical Activity:   . Days of Exercise per Week:   . Minutes of Exercise per Session:   Stress:   . Feeling of Stress :   Social Connections:   . Frequency of Communication with Friends and Family:   . Frequency of Social Gatherings with Friends and Family:   . Attends Religious Services:   . Active Member of Clubs or Organizations:   . Attends Archivist Meetings:   Marland Kitchen Marital Status:      Family History: The patient's family history includes Cancer - Other in his mother; Diabetes in his father and mother; Heart disease in an other family member.  ROS:   Please see the history of present illness.    Is able to ambulate without precipitating chest discomfort.  He denies orthopnea, PND, lower extremity swelling.  He has not had blood in his urine or stool.  He is not short of breath.  He has not had syncope or palpitations suggestive of atrial fibrillation.  No neurological complaints.  All other systems reviewed and are negative.  EKGs/Labs/Other Studies Reviewed:    The following studies were reviewed today: Cardiac catheter in 2000: Done in  [place] at [facility]:    Coronary angiogram June 2020: Coronary Angiography: The left main coronary artery was large in caliber and trifurcated into the LAD and LCX and RI.<20% stenosis, diffuse plaque.  The LAD gave rise to 2 small diagonals across the anterolateral wall. Long stented segment (many stents) from proximal to apical LAD. Diffuse mild-moderate in-stent LAD re-stenosis. Apical segment that underwent PCI with cutting  balloon 04/2017 remains patent. Distal stent edge 70% focal stenosis, this is a <2.150mm vessel at the apex.UNCHANGED vs IMPROVED.  The LCX gave rise to 3 OM branches across the lateral wall. OM1 small vessel with diffuse up to 90% stenosis with TIMI 1 flow. OM2 and OM3 with proximal 70-80% lesions and competitive flow from SVG.  Very small caliber RI with diffuse moderate disease.  The RCA was a dominant artery and supplied both the PDA and PLB. Diffuse up to 40-50% disease throughout with patent PDA stents.  SVG to OM1 to OM2: widely patent good runoff.   LIMA previously shown to be atretic  EKG:  EKG demonstrates sinus bradycardia, rate 54, nonspecific T wave abnormality, small inferior Q waves.  Lateral precordial T wave abnormality.  No acute change compared to prior tracing performed 09/11/2018.  Recent Labs: No results found for requested labs within last 8760 hours.  Recent Lipid Panel No results found for: CHOL, TRIG, HDL, CHOLHDL, VLDL, LDLCALC, LDLDIRECT  Physical Exam:    VS:  BP 102/64   Pulse (!) 53   Ht 5\' 7"  (1.702 m)   Wt 161 lb 12.8 oz (73.4 kg)   SpO2 98%   BMI 25.34 kg/m     Wt Readings from Last 3 Encounters:  03/30/19 161 lb 12.8 oz (73.4 kg)  09/10/18 162 lb 12.8 oz (73.8 kg)     GEN: Appears compensated and not having chest discomfort.. No acute distress HEENT: Normal NECK: No JVD. LYMPHATICS: No lymphadenopathy CARDIAC:  RRR without murmur, gallop, or edema. VASCULAR:  Normal Pulses. No bruits. RESPIRATORY:   Clear to auscultation without rales, wheezing or rhonchi  ABDOMEN: Soft, non-tender, non-distended, No pulsatile mass, MUSCULOSKELETAL: No deformity  SKIN: Warm and dry NEUROLOGIC:  Alert and oriented x 3 PSYCHIATRIC:  Normal affect   ASSESSMENT:    1. Coronary artery disease involving native coronary artery of native heart with unstable angina pectoris (HCC)   2. PAF (paroxysmal atrial fibrillation) (HCC)   3. Essential hypertension   4. Hyperlipidemia LDL goal <70   5. Educated about COVID-19 virus infection   6. Lipid accumulation in muscle determined by magnetic resonance imaging    PLAN:    In order of problems listed above:  1. With the patient now having episodes of discomfort at rest, needs repeat coronary angiography.  Last procedure was approximately 9 months ago.  He has had multiple stent implantations involving his grass and native vessel since his bypass surgery.  LV function is known to be normal.  We [facility].  We will have Victorino DikeJennifer attempt to get those by the time we do the heart catheterization. 2. He does not feel that he has had any episodes of atrial fibrillation. 3. Has noticed extremely high blood pressures during episodes of pain but states that the pain usually raises the blood pressure.  Today's blood pressure is excellent. 4. On maximal therapy for lipid lowering.  We have no recent laboratory data.  We will get laboratory data today. 5. COVID-19 vaccine is desired.  Has just not been able to get it scheduled.  Practicing social distancing.  The patient was counseled to undergo left heart catheterization, coronary angiography, and possible percutaneous coronary intervention with stent implantation. The procedural risks and benefits were discussed in detail. The risks discussed included death, stroke, myocardial infarction, life-threatening bleeding, limb ischemia, kidney injury, allergy, and  possible emergency cardiac surgery. The risk  of these significant complications were estimated to occur less than 1% of the time. After discussion, the patient has agreed to proceed.    Medication Adjustments/Labs and Tests Ordered: Current medicines are reviewed at length with the patient today.  Concerns regarding medicines are outlined above.  Orders Placed This Encounter  Procedures  . Basic metabolic panel  . CBC  . Lipid panel  . Hepatic function panel  . HgB A1c  . EKG 12-Lead   No orders of the defined types were placed in this encounter.   Patient Instructions  Medication Instructions:  Your physician recommends that you continue on your current medications as directed. Please refer to the Current Medication list given to you today.  *If you need a refill on your cardiac medications before your next appointment, please call your pharmacy*   Lab Work: BMET, Lipid, Liver, A1C and CBC today  If you have labs (blood work) drawn today and your tests are completely normal, you will receive your results only by: Marland Kitchen MyChart Message (if you have MyChart) OR . A paper copy in the mail If you have any lab test that is abnormal or we need to change your treatment, we will call you to review the results.   Testing/Procedures: Your physician has requested that you have a cardiac catheterization. Cardiac catheterization is used to diagnose and/or treat various heart conditions. Doctors may recommend this procedure for a number of different reasons. The most common reason is to evaluate chest pain. Chest pain can be a symptom of coronary artery disease (CAD), and cardiac catheterization can show whether plaque is narrowing or blocking your heart's arteries. This procedure is also used to evaluate the valves, as well as measure the blood flow and oxygen levels in different parts of your heart. For further information please visit [email or web address] Please follow instruction sheet, as  given.    Follow-Up: At [facility], you and your health needs are our priority.  As part of our continuing mission to provide you with exceptional heart care, we have created designated Provider Care Teams.  These Care Teams include your primary Cardiologist (physician) and Advanced Practice Providers (APPs -  Physician Assistants and Nurse Practitioners) who all work together to provide you with the care you need, when you need it.  We recommend signing up for the patient portal called "MyChart".  Sign up information is provided on this After Visit Summary.  MyChart is used to connect with patients for Virtual Visits (Telemedicine).  Patients are able to view lab/test results, encounter notes, upcoming appointments, etc.  Non-urgent messages can be sent to your provider as well.   To learn more about what you can do with MyChart, go to [email or web address].    Your next appointment:   2-3 week(s)  The format for your next appointment:   In Person  Provider:   You may see Lesleigh Noe, MD or one of the following Advanced Practice Providers on your designated Care Team:    Norma Fredrickson, NP  Nada Boozer, NP  Georgie Chard, NP    Other Instructions  COVID SCREENING INFORMATION: You are scheduled for your COVID screening on ______ at _______. [employer] (old [facility]) [address] Stay in the RIGHT lane and proceed under the brick awning (NOT the tent) and tell them you are there for pre-procedure testing Do NOT bring any pets with you to the testing site     [facility]  CARDIOVASCULAR DIVISION [facility] ST OFFICE [address] [place] [address] Dept: [phone] Loc: [phone]  LAJARVIS ITALIANO  03/30/2019  You are scheduled for a Cardiac Catheterization on Thursday, April 1 with Dr. Verdis Prime.  1. Please arrive at the [facility] (Main Entrance A) at [facility]: [address] [place], [place] [address] at 8:30 AM (This time is two hours before your procedure to ensure your preparation). Free valet parking service is available.   Special note: Every effort is made to have your procedure done on time. Please understand that emergencies sometimes delay scheduled procedures.  2. Diet: Do not eat solid foods after midnight.  The patient may have clear liquids until 5am upon the day of the procedure.  3. Labs: You will need have labs drawn today  4. Medication instructions in preparation for your procedure:   Contrast Allergy: No  You will need to hold your Furosemide, Glipizide, Spironolactone, and Toujeo the morning of your procedure.   On the morning of your procedure, take your Effient/Prasugrel and any morning medicines NOT listed above.  You may use sips of water.  5. Plan for one night stay--bring personal belongings. 6. Bring a current list of your medications and current insurance cards. 7. You MUST have a responsible person to drive you home. 8. Someone MUST be with you the first 24 hours after you arrive home or your discharge will be delayed. 9. Please wear clothes that are easy to get on and off and wear slip-on shoes.  Thank you for allowing [place] to care for you!   -- [place] [facility]     Signed, Lesleigh Noe, MD  03/30/2019 1:00 PM    [facility]

## 2019-03-30 NOTE — Patient Instructions (Addendum)
Medication Instructions:  Your physician recommends that you continue on your current medications as directed. Please refer to the Current Medication list given to you today.  *If you need a refill on your cardiac medications before your next appointment, please call your pharmacy*   Lab Work: BMET, Lipid, Liver, A1C and CBC today  If you have labs (blood work) drawn today and your tests are completely normal, you will receive your results only by: Marland Kitchen MyChart Message (if you have MyChart) OR . A paper copy in the mail If you have any lab test that is abnormal or we need to change your treatment, we will call you to review the results.   Testing/Procedures: Your physician has requested that you have a cardiac catheterization. Cardiac catheterization is used to diagnose and/or treat various heart conditions. Doctors may recommend this procedure for a number of different reasons. The most common reason is to evaluate chest pain. Chest pain can be a symptom of coronary artery disease (CAD), and cardiac catheterization can show whether plaque is narrowing or blocking your heart's arteries. This procedure is also used to evaluate the valves, as well as measure the blood flow and oxygen levels in different parts of your heart. For further information please visit [email or web address] Please follow instruction sheet, as given.    Follow-Up: At [facility], you and your health needs are our priority.  As part of our continuing mission to provide you with exceptional heart care, we have created designated Provider Care Teams.  These Care Teams include your primary Cardiologist (physician) and Advanced Practice Providers (APPs -  Physician Assistants and Nurse Practitioners) who all work together to provide you with the care you need, when you need it.  We recommend signing up for the patient portal called "MyChart".  Sign up information is provided on this After Visit Summary.  MyChart is used to  connect with patients for Virtual Visits (Telemedicine).  Patients are able to view lab/test results, encounter notes, upcoming appointments, etc.  Non-urgent messages can be sent to your provider as well.   To learn more about what you can do with MyChart, go to [email or web address].    Your next appointment:   2-3 week(s)  The format for your next appointment:   In Person  Provider:   You may see Lesleigh Noe, MD or one of the following Advanced Practice Providers on your designated Care Team:    Norma Fredrickson, NP  Nada Boozer, NP  Georgie Chard, NP    Other Instructions  COVID SCREENING INFORMATION: You are scheduled for your COVID screening on ______ at _______. [employer] (old [facility]) [address] Stay in the RIGHT lane and proceed under the brick awning (NOT the tent) and tell them you are there for pre-procedure testing Do NOT bring any pets with you to the testing site     [facility] CARDIOVASCULAR DIVISION [facility] ST OFFICE [address] [place] [address] Dept: [phone] Loc: [phone]  ERION WEIGHTMAN  03/30/2019  You are scheduled for a Cardiac Catheterization on Thursday, April 1 with Dr. Verdis Prime.  1. Please arrive at the [facility] (Main Entrance A) at [facility]: [address] [place], [place] [address] at 8:30 AM (This time is two hours before your procedure to ensure your preparation). Free valet parking service is available.   Special note: Every effort is made to have your procedure  done on time. Please understand that emergencies sometimes delay scheduled procedures.  2. Diet: Do not eat solid foods after midnight.  The patient may have clear liquids until 5am upon the day of the procedure.  3. Labs: You will need have labs drawn today  4. Medication instructions in preparation for your procedure:   Contrast Allergy: No  You  will need to hold your Furosemide, Glipizide, Spironolactone, and Toujeo the morning of your procedure.   On the morning of your procedure, take your Effient/Prasugrel and any morning medicines NOT listed above.  You may use sips of water.  5. Plan for one night stay--bring personal belongings. 6. Bring a current list of your medications and current insurance cards. 7. You MUST have a responsible person to drive you home. 8. Someone MUST be with you the first 24 hours after you arrive home or your discharge will be delayed. 9. Please wear clothes that are easy to get on and off and wear slip-on shoes.  Thank you for allowing [place] to care for you!   -- [place] [facility]

## 2019-03-31 LAB — BASIC METABOLIC PANEL
BUN/Creatinine Ratio: 16 (ref 10–24)
BUN: 21 mg/dL (ref 8–27)
CO2: 25 mmol/L (ref 20–29)
Calcium: 9.7 mg/dL (ref 8.6–10.2)
Chloride: 100 mmol/L (ref 96–106)
Creatinine, Ser: 1.28 mg/dL — ABNORMAL HIGH (ref 0.76–1.27)
GFR calc Af Amer: 69 mL/min/{1.73_m2} (ref >59)
GFR calc non Af Amer: 60 mL/min/{1.73_m2} (ref >59)
Glucose: 139 mg/dL — ABNORMAL HIGH (ref 65–99)
Potassium: 5 mmol/L (ref 3.5–5.2)
Sodium: 139 mmol/L (ref 134–144)

## 2019-03-31 LAB — CBC
Hematocrit: 43.5 % (ref 37.5–51.0)
Hemoglobin: 14.7 g/dL (ref 13.0–17.7)
MCH: 32.4 pg (ref 26.6–33.0)
MCHC: 33.8 g/dL (ref 31.5–35.7)
MCV: 96 fL (ref 79–97)
Platelets: 180 10*3/uL (ref 150–450)
RBC: 4.54 x10E6/uL (ref 4.14–5.80)
RDW: 12.7 % (ref 11.6–15.4)
WBC: 9.3 10*3/uL (ref 3.4–10.8)

## 2019-03-31 LAB — LIPID PANEL
Chol/HDL Ratio: 3.8 ratio (ref 0.0–5.0)
Cholesterol, Total: 136 mg/dL (ref 100–199)
HDL: 36 mg/dL — ABNORMAL LOW (ref >39)
LDL Chol Calc (NIH): 70 mg/dL (ref 0–99)
Triglycerides: 178 mg/dL — ABNORMAL HIGH (ref 0–149)
VLDL Cholesterol Cal: 30 mg/dL (ref 5–40)

## 2019-03-31 LAB — HEPATIC FUNCTION PANEL
ALT: 16 IU/L (ref 0–44)
AST: 18 IU/L (ref 0–40)
Albumin: 4.9 g/dL — ABNORMAL HIGH (ref 3.8–4.8)
Alkaline Phosphatase: 115 IU/L (ref 39–117)
Bilirubin Total: 0.4 mg/dL (ref 0.0–1.2)
Bilirubin, Direct: 0.14 mg/dL (ref 0.00–0.40)
Total Protein: 7.4 g/dL (ref 6.0–8.5)

## 2019-03-31 LAB — HEMOGLOBIN A1C
Est. average glucose Bld gHb Est-mCnc: 160 mg/dL
Hgb A1c MFr Bld: 7.2 % — ABNORMAL HIGH (ref 4.8–5.6)

## 2019-04-07 ENCOUNTER — Other Ambulatory Visit (HOSPITAL_COMMUNITY)
Admission: RE | Admit: 2019-04-07 | Discharge: 2019-04-07 | Disposition: A | Discharge status: Home / Self Care | Payer: PRIVATE HEALTH INSURANCE | Source: Ambulatory Visit | Attending: Interventional Cardiology | Admitting: Interventional Cardiology

## 2019-04-07 DIAGNOSIS — Z20822 Contact with and (suspected) exposure to covid-19: Secondary | ICD-10-CM | POA: Insufficient documentation

## 2019-04-07 DIAGNOSIS — Z01812 Encounter for preprocedural laboratory examination: Secondary | ICD-10-CM | POA: Insufficient documentation

## 2019-04-07 LAB — SARS CORONAVIRUS 2 (TAT 6-24 HRS): SARS Coronavirus 2: NEGATIVE

## 2019-04-08 ENCOUNTER — Telehealth: Payer: Self-pay | Admitting: *Deleted

## 2019-04-08 NOTE — Telephone Encounter (Signed)
Pt contacted pre-catheterization scheduled at [facility] for: Thursday April 09, 2019 10:30 AM Verified arrival time and place: [facility] Main Entrance A [facility]) at: 8:30 AM   No solid food after midnight prior to cath, clear liquids until 5 AM day of procedure. Contrast allergy: no  Hold: Lasix-AM of procedure. Spironolactone-AM of procedure. Glipizide-AM of procedure. Toujeo-AM of procedure.  Except hold medications AM meds can be  taken pre-cath with sip of water including: ASA 81 mg Effient 10 mg  Confirmed patient has responsible adult to drive home post procedure and observe 24 hours after arriving home: yes  Currently, due to Covid-19 pandemic, only one person will be allowed with patient. Must be the same person for patient's entire stay and will be required to wear a mask. They will be asked to wait in the waiting room for the duration of the patient's stay.  Patients are required to wear a mask when they enter the hospital.      COVID-19 Pre-Screening Questions:  . In the past 7 to 10 days have you had a cough,  shortness of breath, headache, congestion, fever (100 or greater) body aches, chills, sore throat, or sudden loss of taste or sense of smell? no . Have you been around anyone with known Covid 19 in the past 7-10 days? no . Have you been around anyone who is awaiting Covid 19 test results in the past 7 to 10 days? no . Have you been around anyone who has been exposed to Covid 19, or has mentioned symptoms of Covid 19 within the past 7 to 10 days? no   I reviewed procedure/mask/visitor instructions, COVID-19 screening questions with patient.

## 2019-04-09 ENCOUNTER — Ambulatory Visit (HOSPITAL_COMMUNITY)
Admission: RE | Admit: 2019-04-09 | Discharge: 2019-04-09 | Disposition: A | Discharge status: Home / Self Care | Payer: PRIVATE HEALTH INSURANCE | Attending: Interventional Cardiology | Admitting: Interventional Cardiology

## 2019-04-09 ENCOUNTER — Other Ambulatory Visit: Payer: Self-pay

## 2019-04-09 ENCOUNTER — Encounter (HOSPITAL_COMMUNITY)
Admission: RE | Disposition: A | Discharge status: Home / Self Care | Payer: Self-pay | Source: Home / Self Care | Attending: Interventional Cardiology

## 2019-04-09 DIAGNOSIS — Z880 Allergy status to penicillin: Secondary | ICD-10-CM | POA: Diagnosis not present

## 2019-04-09 DIAGNOSIS — I2510 Atherosclerotic heart disease of native coronary artery without angina pectoris: Secondary | ICD-10-CM | POA: Diagnosis not present

## 2019-04-09 DIAGNOSIS — Z79899 Other long term (current) drug therapy: Secondary | ICD-10-CM | POA: Diagnosis not present

## 2019-04-09 DIAGNOSIS — Z951 Presence of aortocoronary bypass graft: Secondary | ICD-10-CM | POA: Diagnosis not present

## 2019-04-09 DIAGNOSIS — I130 Hypertensive heart and chronic kidney disease with heart failure and stage 1 through stage 4 chronic kidney disease, or unspecified chronic kidney disease: Secondary | ICD-10-CM | POA: Diagnosis not present

## 2019-04-09 DIAGNOSIS — Z955 Presence of coronary angioplasty implant and graft: Secondary | ICD-10-CM | POA: Insufficient documentation

## 2019-04-09 DIAGNOSIS — I5032 Chronic diastolic (congestive) heart failure: Secondary | ICD-10-CM | POA: Insufficient documentation

## 2019-04-09 DIAGNOSIS — E1122 Type 2 diabetes mellitus with diabetic chronic kidney disease: Secondary | ICD-10-CM | POA: Insufficient documentation

## 2019-04-09 DIAGNOSIS — Z888 Allergy status to other drugs, medicaments and biological substances status: Secondary | ICD-10-CM | POA: Diagnosis not present

## 2019-04-09 DIAGNOSIS — I209 Angina pectoris, unspecified: Secondary | ICD-10-CM

## 2019-04-09 DIAGNOSIS — I480 Paroxysmal atrial fibrillation: Secondary | ICD-10-CM | POA: Diagnosis not present

## 2019-04-09 DIAGNOSIS — I25810 Atherosclerosis of coronary artery bypass graft(s) without angina pectoris: Secondary | ICD-10-CM

## 2019-04-09 DIAGNOSIS — Z7982 Long term (current) use of aspirin: Secondary | ICD-10-CM | POA: Diagnosis not present

## 2019-04-09 DIAGNOSIS — Z8249 Family history of ischemic heart disease and other diseases of the circulatory system: Secondary | ICD-10-CM | POA: Insufficient documentation

## 2019-04-09 DIAGNOSIS — E785 Hyperlipidemia, unspecified: Secondary | ICD-10-CM

## 2019-04-09 DIAGNOSIS — N189 Chronic kidney disease, unspecified: Secondary | ICD-10-CM | POA: Diagnosis not present

## 2019-04-09 DIAGNOSIS — Z833 Family history of diabetes mellitus: Secondary | ICD-10-CM | POA: Diagnosis not present

## 2019-04-09 DIAGNOSIS — I25119 Atherosclerotic heart disease of native coronary artery with unspecified angina pectoris: Secondary | ICD-10-CM | POA: Insufficient documentation

## 2019-04-09 DIAGNOSIS — Z794 Long term (current) use of insulin: Secondary | ICD-10-CM | POA: Insufficient documentation

## 2019-04-09 HISTORY — PX: LEFT HEART CATH AND CORS/GRAFTS ANGIOGRAPHY: CATH118250

## 2019-04-09 HISTORY — DX: Hyperlipidemia, unspecified: E78.5

## 2019-04-09 HISTORY — DX: Atherosclerosis of coronary artery bypass graft(s) without angina pectoris: I25.810

## 2019-04-09 HISTORY — DX: Angina pectoris, unspecified: I20.9

## 2019-04-09 LAB — GLUCOSE, CAPILLARY: Glucose-Capillary: 134 mg/dL — ABNORMAL HIGH (ref 70–99)

## 2019-04-09 SURGERY — LEFT HEART CATH AND CORS/GRAFTS ANGIOGRAPHY
Anesthesia: LOCAL

## 2019-04-09 MED ORDER — ACETAMINOPHEN 325 MG PO TABS: 650.0000 mg | ORAL_TABLET | ORAL | Status: DC | PRN

## 2019-04-09 MED ORDER — IOHEXOL 350 MG/ML SOLN
INTRAVENOUS | Status: AC
  Filled 2019-04-09: qty 1

## 2019-04-09 MED ORDER — ONDANSETRON HCL 4 MG/2ML IJ SOLN: 4.0000 mg | Freq: Four times a day (QID) | INTRAMUSCULAR | Status: DC | PRN

## 2019-04-09 MED ORDER — HEPARIN (PORCINE) IN NACL 1000-0.9 UT/500ML-% IV SOLN
INTRAVENOUS | Status: AC
  Filled 2019-04-09: qty 1000

## 2019-04-09 MED ORDER — OXYCODONE HCL 5 MG PO TABS: 5.0000 mg | ORAL_TABLET | ORAL | Status: DC | PRN

## 2019-04-09 MED ORDER — SODIUM CHLORIDE 0.9 % WEIGHT BASED INFUSION
3.0000 mL/kg/h | INTRAVENOUS | Status: AC
  Administered 2019-04-09: 3 mL/kg/h via INTRAVENOUS

## 2019-04-09 MED ORDER — IOHEXOL 350 MG/ML SOLN
INTRAVENOUS | Status: DC | PRN
  Administered 2019-04-09: 115 mL

## 2019-04-09 MED ORDER — VERAPAMIL HCL 2.5 MG/ML IV SOLN
INTRAVENOUS | Status: AC
  Filled 2019-04-09: qty 2

## 2019-04-09 MED ORDER — LABETALOL HCL 5 MG/ML IV SOLN: 10.0000 mg | INTRAVENOUS | Status: DC | PRN

## 2019-04-09 MED ORDER — MIDAZOLAM HCL 2 MG/2ML IJ SOLN
INTRAMUSCULAR | Status: AC
  Filled 2019-04-09: qty 2

## 2019-04-09 MED ORDER — SODIUM CHLORIDE 0.9% FLUSH: 3.0000 mL | INTRAVENOUS | Status: DC | PRN

## 2019-04-09 MED ORDER — LIDOCAINE HCL (PF) 1 % IJ SOLN
INTRAMUSCULAR | Status: AC
  Filled 2019-04-09: qty 30

## 2019-04-09 MED ORDER — SODIUM CHLORIDE 0.9 % WEIGHT BASED INFUSION
1.0000 mL/kg/h | INTRAVENOUS | Status: DC
  Administered 2019-04-09: 08:00:00 1 mL/kg/h via INTRAVENOUS

## 2019-04-09 MED ORDER — HEPARIN SODIUM (PORCINE) 1000 UNIT/ML IJ SOLN
INTRAMUSCULAR | Status: DC | PRN
  Administered 2019-04-09: 3500 [IU] via INTRAVENOUS

## 2019-04-09 MED ORDER — FENTANYL CITRATE (PF) 100 MCG/2ML IJ SOLN
INTRAMUSCULAR | Status: DC | PRN
  Administered 2019-04-09 (×2): 25 ug via INTRAVENOUS

## 2019-04-09 MED ORDER — VERAPAMIL HCL 2.5 MG/ML IV SOLN
INTRAVENOUS | Status: DC | PRN
  Administered 2019-04-09: 10:00:00 10 mL via INTRA_ARTERIAL

## 2019-04-09 MED ORDER — ASPIRIN 81 MG PO CHEW: 81.0000 mg | CHEWABLE_TABLET | Freq: Every day | ORAL | Status: DC

## 2019-04-09 MED ORDER — SODIUM CHLORIDE 0.9 % IV SOLN: INTRAVENOUS | Status: DC

## 2019-04-09 MED ORDER — ASPIRIN 81 MG PO CHEW: 81.0000 mg | CHEWABLE_TABLET | ORAL | Status: DC

## 2019-04-09 MED ORDER — FENTANYL CITRATE (PF) 100 MCG/2ML IJ SOLN
INTRAMUSCULAR | Status: AC
  Filled 2019-04-09: qty 2

## 2019-04-09 MED ORDER — MIDAZOLAM HCL 2 MG/2ML IJ SOLN
INTRAMUSCULAR | Status: DC | PRN
  Administered 2019-04-09 (×3): 1 mg via INTRAVENOUS

## 2019-04-09 MED ORDER — SODIUM CHLORIDE 0.9% FLUSH: 3.0000 mL | Freq: Two times a day (BID) | INTRAVENOUS | Status: DC

## 2019-04-09 MED ORDER — PRASUGREL HCL 10 MG PO TABS: 10.0000 mg | ORAL_TABLET | ORAL | Status: DC

## 2019-04-09 MED ORDER — HEPARIN SODIUM (PORCINE) 1000 UNIT/ML IJ SOLN
INTRAMUSCULAR | Status: AC
  Filled 2019-04-09: qty 1

## 2019-04-09 MED ORDER — SODIUM CHLORIDE 0.9 % IV SOLN: 250.0000 mL | INTRAVENOUS | Status: DC | PRN

## 2019-04-09 MED ORDER — HYDRALAZINE HCL 20 MG/ML IJ SOLN: 10.0000 mg | INTRAMUSCULAR | Status: DC | PRN

## 2019-04-09 MED ORDER — PRASUGREL HCL 10 MG PO TABS: 10.0000 mg | ORAL_TABLET | Freq: Every day | ORAL | Status: DC

## 2019-04-09 MED ORDER — HEPARIN (PORCINE) IN NACL 1000-0.9 UT/500ML-% IV SOLN
INTRAVENOUS | Status: DC | PRN
  Administered 2019-04-09 (×2): 500 mL

## 2019-04-09 MED ORDER — LIDOCAINE HCL (PF) 1 % IJ SOLN
INTRAMUSCULAR | Status: DC | PRN
  Administered 2019-04-09 (×2): 2 mL

## 2019-04-09 SURGICAL SUPPLY — 11 items
CATH INFINITI 5FR MPB2 (CATHETERS) ×1 IMPLANT
CATH INFINITI 5FR MULTPACK ANG (CATHETERS) ×1 IMPLANT
DEVICE RAD COMP TR BAND LRG (VASCULAR PRODUCTS) ×1 IMPLANT
GLIDESHEATH SLEND A-KIT 6F 22G (SHEATH) ×1 IMPLANT
GUIDEWIRE INQWIRE 1.5J.035X260 (WIRE) IMPLANT
INQWIRE 1.5J .035X260CM (WIRE) ×2
KIT HEART LEFT (KITS) ×2 IMPLANT
PACK CARDIAC CATHETERIZATION (CUSTOM PROCEDURE TRAY) ×2 IMPLANT
SHEATH PROBE COVER 6X72 (BAG) ×1 IMPLANT
TRANSDUCER W/STOPCOCK (MISCELLANEOUS) ×2 IMPLANT
TUBING CIL FLEX 10 FLL-RA (TUBING) ×2 IMPLANT

## 2019-04-09 NOTE — Discharge Instructions (Signed)
Drink plenty of fluids Keep left arm at or above heart level  Radial Site Care  This sheet gives you information about how to care for yourself after your procedure. Your health care provider may also give you more specific instructions. If you have problems or questions, contact your health care provider. What can I expect after the procedure? After the procedure, it is common to have:  Bruising and tenderness at the catheter insertion area. Follow these instructions at home: Medicines  Take over-the-counter and prescription medicines only as told by your health care provider. Insertion site care  Follow instructions from your health care provider about how to take care of your insertion site. Make sure you: ? Wash your hands with soap and water before you change your bandage (dressing). If soap and water are not available, use hand sanitizer. ? Change your dressing as told by your health care provider. ? Leave stitches (sutures), skin glue, or adhesive strips in place. These skin closures may need to stay in place for 2 weeks or longer. If adhesive strip edges start to loosen and curl up, you may trim the loose edges. Do not remove adhesive strips completely unless your health care provider tells you to do that.  Check your insertion site every day for signs of infection. Check for: ? Redness, swelling, or pain. ? Fluid or blood. ? Pus or a bad smell. ? Warmth.  Do not take baths, swim, or use a hot tub until your health care provider approves.  You may shower 24-48 hours after the procedure, or as directed by your health care provider. ? Remove the dressing and gently wash the site with plain soap and water. ? Pat the area dry with a clean towel. ? Do not rub the site. That could cause bleeding.  Do not apply powder or lotion to the site. Activity   For 24 hours after the procedure, or as directed by your health care provider: ? Do not flex or bend the affected arm. ? Do  not push or pull heavy objects with the affected arm. ? Do not drive yourself home from the hospital or clinic. You may drive 24 hours after the procedure unless your health care provider tells you not to. ? Do not operate machinery or power tools.  Do not lift anything that is heavier than 10 lb (4.5 kg), or the limit that you are told, until your health care provider says that it is safe.  Ask your health care provider when it is okay to: ? Return to work or school. ? Resume usual physical activities or sports. ? Resume sexual activity. General instructions  If the catheter site starts to bleed, raise your arm and put firm pressure on the site. If the bleeding does not stop, get help right away. This is a medical emergency.  If you went home on the same day as your procedure, a responsible adult should be with you for the first 24 hours after you arrive home.  Keep all follow-up visits as told by your health care provider. This is important. Contact a health care provider if:  You have a fever.  You have redness, swelling, or yellow drainage around your insertion site. Get help right away if:  You have unusual pain at the radial site.  The catheter insertion area swells very fast.  The insertion area is bleeding, and the bleeding does not stop when you hold steady pressure on the area.  Your arm or hand  becomes pale, cool, tingly, or numb. These symptoms may represent a serious problem that is an emergency. Do not wait to see if the symptoms will go away. Get medical help right away. Call your local emergency services (911 in the [place] Do not drive yourself to the hospital. Summary  After the procedure, it is common to have bruising and tenderness at the site.  Follow instructions from your health care provider about how to take care of your radial site wound. Check the wound every day for signs of infection.  Do not lift anything that is heavier than 10 lb (4.5 kg), or the  limit that you are told, until your health care provider says that it is safe. This information is not intended to replace advice given to you by your health care provider. Make sure you discuss any questions you have with your health care provider. Document Revised: 01/30/2017 Document Reviewed: 01/30/2017 Elsevier Patient Education  2020 [employer]  

## 2019-04-09 NOTE — Progress Notes (Signed)
Discharge instructions reviewed with pt and his wife (via telephone) both voice understanding.  

## 2019-04-09 NOTE — Interval H&P Note (Signed)
Cath Lab Visit (complete for each Cath Lab visit)  Clinical Evaluation Leading to the Procedure:   ACS: No.  Non-ACS:    Anginal Classification: CCS III  Anti-ischemic medical therapy: Maximal Therapy (2 or more classes of medications)  Non-Invasive Test Results: No non-invasive testing performed  Prior CABG: Previous CABG      History and Physical Interval Note:  04/09/2019 10:04 AM  Terry Nelson  has presented today for surgery, with the diagnosis of angina.  The various methods of treatment have been discussed with the patient and family. After consideration of risks, benefits and other options for treatment, the patient has consented to  Procedure(s): LEFT HEART CATH AND CORS/GRAFTS ANGIOGRAPHY (N/A) as a surgical intervention.  The patient's history has been reviewed, patient examined, no change in status, stable for surgery.  I have reviewed the patient's chart and labs.  Questions were answered to the patient's satisfaction.     Lyn Records III

## 2019-04-09 NOTE — CV Procedure (Signed)
   Left heart cath, coronary angio, bypass graft angiography, and left ventriculography via left radial.  Real-time vascular ultrasound used for guidance.  Despite 2-3 times more lidocaine then is easily applied, he complained of discomfort at the insertion site throughout the entire case.  This was somewhat odd.  Atretic LIMA.  Patent left main.  LAD contains overlapping stents from the proximal segment to the apex.  Multiple areas of moderate stenosis depending upon review.  There is diffuse disease in the mid to distal stented segment.  I am unable to convincingly see high-grade obstruction in orthogonal views at any site.  There is a clear area of stent under expansion and a region of stent overlap that can be seen without contrast injection.  Circumflex is patent.  There is total occlusion of the first obtuse marginal.  Right coronary is diffusely diseased.  The distal overlapping stent is widely patent.  Hypokinesis mid anterior wall possibly in the distribution of the diagonal.  EF approximately 50%.  EDP is normal.  Plan continue Effient and aspirin.  Despite having obtained digital images from the hospital in [place], the images would not play.  We will attempt to get an additional DVR but also determine if we can power share.  I will compare current to prior and if significant differences are noted, perhaps further angioplasty within the stent artery can occur.  Did not feel comfortable redilating without clear endpoint.

## 2019-04-21 ENCOUNTER — Emergency Department (HOSPITAL_COMMUNITY): Payer: PRIVATE HEALTH INSURANCE

## 2019-04-21 ENCOUNTER — Emergency Department (HOSPITAL_COMMUNITY)
Admission: EM | Admit: 2019-04-21 | Discharge: 2019-04-21 | Disposition: A | Discharge status: Home / Self Care | Payer: PRIVATE HEALTH INSURANCE | Attending: Emergency Medicine | Admitting: Emergency Medicine

## 2019-04-21 ENCOUNTER — Encounter (HOSPITAL_COMMUNITY): Payer: Self-pay | Admitting: Emergency Medicine

## 2019-04-21 DIAGNOSIS — R079 Chest pain, unspecified: Secondary | ICD-10-CM | POA: Diagnosis not present

## 2019-04-21 DIAGNOSIS — Z5321 Procedure and treatment not carried out due to patient leaving prior to being seen by health care provider: Secondary | ICD-10-CM | POA: Insufficient documentation

## 2019-04-21 LAB — BASIC METABOLIC PANEL
Anion gap: 10 (ref 5–15)
BUN: 18 mg/dL (ref 8–23)
CO2: 23 mmol/L (ref 22–32)
Calcium: 9.4 mg/dL (ref 8.9–10.3)
Chloride: 102 mmol/L (ref 98–111)
Creatinine, Ser: 1.28 mg/dL — ABNORMAL HIGH (ref 0.61–1.24)
GFR calc Af Amer: >60 mL/min (ref >60)
GFR calc non Af Amer: 60 mL/min — ABNORMAL LOW (ref >60)
Glucose, Bld: 109 mg/dL — ABNORMAL HIGH (ref 70–99)
Potassium: 4.7 mmol/L (ref 3.5–5.1)
Sodium: 135 mmol/L (ref 135–145)

## 2019-04-21 LAB — CBC
HCT: 40.5 % (ref 39.0–52.0)
Hemoglobin: 13.6 g/dL (ref 13.0–17.0)
MCH: 32.6 pg (ref 26.0–34.0)
MCHC: 33.6 g/dL (ref 30.0–36.0)
MCV: 97.1 fL (ref 80.0–100.0)
Platelets: 165 10*3/uL (ref 150–400)
RBC: 4.17 MIL/uL — ABNORMAL LOW (ref 4.22–5.81)
RDW: 12.8 % (ref 11.5–15.5)
WBC: 8.2 10*3/uL (ref 4.0–10.5)
nRBC: 0 % (ref 0.0–0.2)

## 2019-04-21 LAB — TROPONIN I (HIGH SENSITIVITY): Troponin I (High Sensitivity): 11 ng/L (ref <18)

## 2019-04-21 MED ORDER — SODIUM CHLORIDE 0.9% FLUSH: 3.0000 mL | Freq: Once | INTRAVENOUS | Status: DC

## 2019-04-21 NOTE — ED Triage Notes (Signed)
Pt arrives via [facility] from home with cp that started suddenly at 4pm. Pt had recent bypass surgery as well as stents. Ems gave 200 mcg fentanyl 1 nitro and 324 mg asa.

## 2019-04-23 NOTE — H&P (View-Only) (Signed)
Cardiology Office Note:    Date:  04/23/2019   ID:  Terry Nelson, DOB 09/25/1955, MRN [id number]  PCP:  Finnegan, Jill, PA-C  Cardiologist:  Terry Kalb Nelson Aubry Tucholski III, MD   Referring MD: Finnegan, Jill, PA-C   No chief complaint on file.   History of Present Illness:    Terry Nelson is a 64 y.o. male with a hx of CAD,CABG in 2002 by Dr. Hendrickson,PAF, Htn, HL, and CKD.  He has bypass graft failure.  Heavily stented native LAD.  Recurring episodes of angina.  Poorly responsive to nitroglycerin.  Gradual worsening since coronary angiography was performed on 04/09/2019.  Intervention was not done because it was not an obvious site to intervene upon unless it was within moderate in-stent restenosis in LAD.  Since the above-noted cath, anginal symptoms have gotten worse.  He feels his physicians in [place] what have been able to do some intervention to relieve his current easily provokable angina.  We had received a CD-ROM/TAVR of prior images from the previous hospital in [place], however the images were not loaded on the disc.  We have received the new disc, I have not yet had a chance to review it, and this will be done later today compared with angiogram from 2 weeks ago.  Have decided to go ahead and tentatively schedule him for repeat cath and PCI early this coming week.  He is on the schedule for Dr. Chris Nelson.  Perhaps the LAD needs to be diffusely ballooned because there was at least moderate in-stent restenosis noted.  Past Medical History:  Diagnosis Date  . Anxiety state   . Arteriosclerosis of coronary artery   . Body mass index (bmi) 26.0-26.9, adult   . Chest pain   . CHF (congestive heart failure) (HCC)   . Chronic renal impairment   . DDD (degenerative disc disease), lumbar   . DM (diabetes mellitus) (HCC)   . Gastritis   . Headache   . Hemorrhage   . Hyperlipidemia   . Hypertension   . Kidney stones   . Lumbago   . Neck pain   . Paroxysmal A-fib (HCC)   .  Pernicious anemia   . Unstable angina (HCC)     Past Surgical History:  Procedure Laterality Date  . ABLATION  2017  . CARDIAC CATHETERIZATION    . CORONARY ARTERY BYPASS GRAFT    . LEFT HEART CATH AND CORS/GRAFTS ANGIOGRAPHY N/A 04/09/2019   Procedure: LEFT HEART CATH AND CORS/GRAFTS ANGIOGRAPHY;  Surgeon: Terry Lehtinen W, MD;  Location: [facility];  Service: Cardiovascular;  Laterality: N/A;    Current Medications: No outpatient medications have been marked as taking for the 04/24/19 encounter (Appointment) with Terry Hitzeman W, MD.     Allergies:   Metoclopramide and Penicillins   Social History   Socioeconomic History  . Marital status: Married    Spouse name: Not on file  . Number of children: Not on file  . Years of education: Not on file  . Highest education level: Not on file  Occupational History  . Not on file  Tobacco Use  . Smoking status: Never Smoker  . Smokeless tobacco: Never Used  Substance and Sexual Activity  . Alcohol use: Not on file  . Drug use: Not on file  . Sexual activity: Not on file    Comment: MARRIED  Other Topics Concern  . Not on file  Social History Narrative  . Not on file     Social Determinants of Health   Financial Resource Strain:   . Difficulty of Paying Living Expenses:   Food Insecurity:   . Worried About Charity fundraiser in the Last Year:   . Arboriculturist in the Last Year:   Transportation Needs:   . Film/video editor (Medical):   Marland Kitchen Lack of Transportation (Non-Medical):   Physical Activity:   . Days of Exercise per Week:   . Minutes of Exercise per Session:   Stress:   . Feeling of Stress :   Social Connections:   . Frequency of Communication with Friends and Family:   . Frequency of Social Gatherings with Friends and Family:   . Attends Religious Services:   . Active Member of Clubs or Organizations:   . Attends Archivist Meetings:   Marland Kitchen Marital Status:      Family History: The patient's  family history includes Cancer - Other in his mother; Diabetes in his father and mother; Heart disease in an other family member.  ROS:   Please see the history of present illness.    He has some level of anxiety.  We did a heart catheterization from his left radial.  He refuses to ever have another cath down from the radial approach.  He prefers femoral catheterization.  He has had several emergency room visits since his cath in April.  All other systems reviewed and are negative.  EKGs/Labs/Other Studies Reviewed:    The following studies were reviewed today: Coronary angiography 04/09/2019: Diagnostic Dominance: Right    EKG:  EKG not repeated.  Recent EKGs have been reviewed.  Recent Labs: 03/30/2019: ALT 16 04/21/2019: BUN 18; Creatinine, Ser 1.28; Hemoglobin 13.6; Platelets 165; Potassium 4.7; Sodium 135  Recent Lipid Panel    Component Value Date/Time   CHOL 136 03/30/2019 1234   TRIG 178 (H) 03/30/2019 1234   HDL 36 (L) 03/30/2019 1234   CHOLHDL 3.8 03/30/2019 1234   LDLCALC 70 03/30/2019 1234    Physical Exam:    VS:  There were no vitals taken for this visit.    Wt Readings from Last 3 Encounters:  04/09/19 162 lb 12.8 oz (73.8 kg)  03/30/19 161 lb 12.8 oz (73.4 kg)  09/10/18 162 lb 12.8 oz (73.8 kg)     GEN: Appears miserable and depressed.. No acute distress HEENT: Normal NECK: No JVD. LYMPHATICS: No lymphadenopathy CARDIAC:  RRR without murmur, gallop, or edema. VASCULAR:  Normal Pulses. No bruits. RESPIRATORY:  Clear to auscultation without rales, wheezing or rhonchi  ABDOMEN: Soft, non-tender, non-distended, No pulsatile mass, MUSCULOSKELETAL: No deformity  SKIN: Warm and dry NEUROLOGIC:  Alert and oriented x 3 PSYCHIATRIC:  Normal affect   ASSESSMENT:    1. Coronary artery disease involving native coronary artery of native heart with unstable angina pectoris ([place])   2. PAF (paroxysmal atrial fibrillation) ([place])   3. Essential hypertension   4.  Hyperlipidemia LDL goal <70   5. Educated about COVID-19 virus infection    PLAN:    In order of problems listed above:  1. Diffuse coronary disease, "full metal jacket" in the LAD, atretic LIMA to LAD.  Plan is to review last angiogram from [facility] and compared to our April 1 images.  No real option to further increase antianginal therapy. 2. Not addressed 3. Blood pressure is under good control 4. LDL target less than 70 5. COVID-19 vaccine and social distancing have been complied with.  The patient  was counseled to undergo left heart catheterization and probable LAD percutaneous coronary intervention +/- stent implantation. The procedural risks and benefits were discussed in detail. The risks discussed included death, stroke, myocardial infarction, life-threatening bleeding, limb ischemia, kidney injury, allergy, and possible emergency cardiac surgery. The risk of these significant complications were estimated to occur less than 1% of the time. After discussion, the patient has agreed to proceed.  We will discuss with Dr. Okey Dupre   Medication Adjustments/Labs and Tests Ordered: Current medicines are reviewed at length with the patient today.  Concerns regarding medicines are outlined above.  No orders of the defined types were placed in this encounter.  No orders of the defined types were placed in this encounter.   There are no Patient Instructions on file for this visit.   Signed, Lesleigh Noe, MD  04/23/2019 9:19 AM    [facility]

## 2019-04-23 NOTE — Progress Notes (Signed)
Cardiology Office Note:    Date:  04/23/2019   ID:  ADRIN Nelson, DOB 15-Apr-1955, MRN [id number]  PCP:  Jiles Harold, PA-C  Cardiologist:  Lesleigh Noe, MD   Referring MD: Jiles Harold, PA-C   No chief complaint on file.   History of Present Illness:    Terry Nelson is a 64 y.o. male with a hx of CAD,CABG in 2002 by Dr. Raiford Noble, Htn, HL, and CKD.  He has bypass graft failure.  Heavily stented native LAD.  Recurring episodes of angina.  Poorly responsive to nitroglycerin.  Gradual worsening since coronary angiography was performed on 04/09/2019.  Intervention was not done because it was not an obvious site to intervene upon unless it was within moderate in-stent restenosis in LAD.  Since the above-noted cath, anginal symptoms have gotten worse.  He feels his physicians in [place] what have been able to do some intervention to relieve his current easily provokable angina.  We had received a CD-ROM/TAVR of prior images from the previous hospital in [place], however the images were not loaded on the disc.  We have received the new disc, I have not yet had a chance to review it, and this will be done later today compared with angiogram from 2 weeks ago.  Have decided to go ahead and tentatively schedule him for repeat cath and PCI early this coming week.  He is on the schedule for Dr. Thayer Oh[place] End.  Perhaps the LAD needs to be diffusely ballooned because there was at least moderate in-stent restenosis noted.  Past Medical History:  Diagnosis Date  . Anxiety state   . Arteriosclerosis of coronary artery   . Body mass index (bmi) 26.0-26.9, adult   . Chest pain   . CHF (congestive heart failure) (HCC)   . Chronic renal impairment   . DDD (degenerative disc disease), lumbar   . DM (diabetes mellitus) (HCC)   . Gastritis   . Headache   . Hemorrhage   . Hyperlipidemia   . Hypertension   . Kidney stones   . Lumbago   . Neck pain   . Paroxysmal A-fib (HCC)   .  Pernicious anemia   . Unstable angina [facility])     Past Surgical History:  Procedure Laterality Date  . ABLATION  2017  . CARDIAC CATHETERIZATION    . CORONARY ARTERY BYPASS GRAFT    . LEFT HEART CATH AND CORS/GRAFTS ANGIOGRAPHY N/A 04/09/2019   Procedure: LEFT HEART CATH AND CORS/GRAFTS ANGIOGRAPHY;  Surgeon: Lyn Records, MD;  Location: [facility];  Service: Cardiovascular;  Laterality: N/A;    Current Medications: No outpatient medications have been marked as taking for the 04/24/19 encounter (Appointment) with Lyn Records, MD.     Allergies:   Metoclopramide and Penicillins   Social History   Socioeconomic History  . Marital status: Married    Spouse name: Not on file  . Number of children: Not on file  . Years of education: Not on file  . Highest education level: Not on file  Occupational History  . Not on file  Tobacco Use  . Smoking status: Never Smoker  . Smokeless tobacco: Never Used  Substance and Sexual Activity  . Alcohol use: Not on file  . Drug use: Not on file  . Sexual activity: Not on file    Comment: MARRIED  Other Topics Concern  . Not on file  Social History Narrative  . Not on file  Social Determinants of Health   Financial Resource Strain:   . Difficulty of Paying Living Expenses:   Food Insecurity:   . Worried About Charity fundraiser in the Last Year:   . Arboriculturist in the Last Year:   Transportation Needs:   . Film/video editor (Medical):   Marland Kitchen Lack of Transportation (Non-Medical):   Physical Activity:   . Days of Exercise per Week:   . Minutes of Exercise per Session:   Stress:   . Feeling of Stress :   Social Connections:   . Frequency of Communication with Friends and Family:   . Frequency of Social Gatherings with Friends and Family:   . Attends Religious Services:   . Active Member of Clubs or Organizations:   . Attends Archivist Meetings:   Marland Kitchen Marital Status:      Family History: The patient's  family history includes Cancer - Other in his mother; Diabetes in his father and mother; Heart disease in an other family member.  ROS:   Please see the history of present illness.    He has some level of anxiety.  We did a heart catheterization from his left radial.  He refuses to ever have another cath down from the radial approach.  He prefers femoral catheterization.  He has had several emergency room visits since his cath in April.  All other systems reviewed and are negative.  EKGs/Labs/Other Studies Reviewed:    The following studies were reviewed today: Coronary angiography 04/09/2019: Diagnostic Dominance: Right    EKG:  EKG not repeated.  Recent EKGs have been reviewed.  Recent Labs: 03/30/2019: ALT 16 04/21/2019: BUN 18; Creatinine, Ser 1.28; Hemoglobin 13.6; Platelets 165; Potassium 4.7; Sodium 135  Recent Lipid Panel    Component Value Date/Time   CHOL 136 03/30/2019 1234   TRIG 178 (H) 03/30/2019 1234   HDL 36 (L) 03/30/2019 1234   CHOLHDL 3.8 03/30/2019 1234   LDLCALC 70 03/30/2019 1234    Physical Exam:    VS:  There were no vitals taken for this visit.    Wt Readings from Last 3 Encounters:  04/09/19 162 lb 12.8 oz (73.8 kg)  03/30/19 161 lb 12.8 oz (73.4 kg)  09/10/18 162 lb 12.8 oz (73.8 kg)     GEN: Appears miserable and depressed.. No acute distress HEENT: Normal NECK: No JVD. LYMPHATICS: No lymphadenopathy CARDIAC:  RRR without murmur, gallop, or edema. VASCULAR:  Normal Pulses. No bruits. RESPIRATORY:  Clear to auscultation without rales, wheezing or rhonchi  ABDOMEN: Soft, non-tender, non-distended, No pulsatile mass, MUSCULOSKELETAL: No deformity  SKIN: Warm and dry NEUROLOGIC:  Alert and oriented x 3 PSYCHIATRIC:  Normal affect   ASSESSMENT:    1. Coronary artery disease involving native coronary artery of native heart with unstable angina pectoris ([place])   2. PAF (paroxysmal atrial fibrillation) ([place])   3. Essential hypertension   4.  Hyperlipidemia LDL goal <70   5. Educated about COVID-19 virus infection    PLAN:    In order of problems listed above:  1. Diffuse coronary disease, "full metal jacket" in the LAD, atretic LIMA to LAD.  Plan is to review last angiogram from [facility] and compared to our April 1 images.  No real option to further increase antianginal therapy. 2. Not addressed 3. Blood pressure is under good control 4. LDL target less than 70 5. COVID-19 vaccine and social distancing have been complied with.  The patient  was counseled to undergo left heart catheterization and probable LAD percutaneous coronary intervention +/- stent implantation. The procedural risks and benefits were discussed in detail. The risks discussed included death, stroke, myocardial infarction, life-threatening bleeding, limb ischemia, kidney injury, allergy, and possible emergency cardiac surgery. The risk of these significant complications were estimated to occur less than 1% of the time. After discussion, the patient has agreed to proceed.  We will discuss with Dr. Okey Dupre   Medication Adjustments/Labs and Tests Ordered: Current medicines are reviewed at length with the patient today.  Concerns regarding medicines are outlined above.  No orders of the defined types were placed in this encounter.  No orders of the defined types were placed in this encounter.   There are no Patient Instructions on file for this visit.   Signed, Lesleigh Noe, MD  04/23/2019 9:19 AM    [facility]

## 2019-04-24 ENCOUNTER — Ambulatory Visit (INDEPENDENT_AMBULATORY_CARE_PROVIDER_SITE_OTHER): Payer: PRIVATE HEALTH INSURANCE | Admitting: Interventional Cardiology

## 2019-04-24 ENCOUNTER — Encounter: Payer: Self-pay | Admitting: Interventional Cardiology

## 2019-04-24 ENCOUNTER — Other Ambulatory Visit (HOSPITAL_COMMUNITY)
Admission: RE | Admit: 2019-04-24 | Discharge: 2019-04-24 | Disposition: A | Discharge status: Home / Self Care | Payer: PRIVATE HEALTH INSURANCE | Source: Ambulatory Visit | Attending: Internal Medicine | Admitting: Internal Medicine

## 2019-04-24 ENCOUNTER — Other Ambulatory Visit: Payer: Self-pay

## 2019-04-24 VITALS — BP 162/88 | HR 62 | Ht 67.0 in | Wt 161.2 lb

## 2019-04-24 DIAGNOSIS — I480 Paroxysmal atrial fibrillation: Secondary | ICD-10-CM | POA: Diagnosis not present

## 2019-04-24 DIAGNOSIS — E785 Hyperlipidemia, unspecified: Secondary | ICD-10-CM

## 2019-04-24 DIAGNOSIS — I10 Essential (primary) hypertension: Secondary | ICD-10-CM | POA: Diagnosis not present

## 2019-04-24 DIAGNOSIS — Z01812 Encounter for preprocedural laboratory examination: Secondary | ICD-10-CM | POA: Diagnosis not present

## 2019-04-24 DIAGNOSIS — I25110 Atherosclerotic heart disease of native coronary artery with unstable angina pectoris: Secondary | ICD-10-CM | POA: Diagnosis not present

## 2019-04-24 DIAGNOSIS — Z20822 Contact with and (suspected) exposure to covid-19: Secondary | ICD-10-CM | POA: Diagnosis not present

## 2019-04-24 DIAGNOSIS — Z7189 Other specified counseling: Secondary | ICD-10-CM

## 2019-04-24 LAB — BASIC METABOLIC PANEL
BUN/Creatinine Ratio: 16 (ref 10–24)
BUN: 18 mg/dL (ref 8–27)
CO2: 26 mmol/L (ref 20–29)
Calcium: 10 mg/dL (ref 8.6–10.2)
Chloride: 99 mmol/L (ref 96–106)
Creatinine, Ser: 1.1 mg/dL (ref 0.76–1.27)
GFR calc Af Amer: 83 mL/min/{1.73_m2} (ref >59)
GFR calc non Af Amer: 72 mL/min/{1.73_m2} (ref >59)
Glucose: 119 mg/dL — ABNORMAL HIGH (ref 65–99)
Potassium: 4.7 mmol/L (ref 3.5–5.2)
Sodium: 141 mmol/L (ref 134–144)

## 2019-04-24 LAB — SARS CORONAVIRUS 2 (TAT 6-24 HRS): SARS Coronavirus 2: NEGATIVE

## 2019-04-24 NOTE — Patient Instructions (Addendum)
Medication Instructions:  Your physician recommends that you continue on your current medications as directed. Please refer to the Current Medication list given to you today.  *If you need a refill on your cardiac medications before your next appointment, please call your pharmacy*   Lab Work: BMET today  If you have labs (blood work) drawn today and your tests are completely normal, you will receive your results only by: Marland Kitchen MyChart Message (if you have MyChart) OR . A paper copy in the mail If you have any lab test that is abnormal or we need to change your treatment, we will call you to review the results.   Testing/Procedures: Your physician has requested that you have a cardiac catheterization. Cardiac catheterization is used to diagnose and/or treat various heart conditions. Doctors may recommend this procedure for a number of different reasons. The most common reason is to evaluate chest pain. Chest pain can be a symptom of coronary artery disease (CAD), and cardiac catheterization can show whether plaque is narrowing or blocking your heart's arteries. This procedure is also used to evaluate the valves, as well as measure the blood flow and oxygen levels in different parts of your heart. For further information please visit [email or web address]. Please follow instruction sheet, as given.    Follow-Up: At [facility], you and your health needs are our priority.  As part of our continuing mission to provide you with exceptional heart care, we have created designated Provider Care Teams.  These Care Teams include your primary Cardiologist (physician) and Advanced Practice Providers (APPs -  Physician Assistants and Nurse Practitioners) who all work together to provide you with the care you need, when you need it.  We recommend signing up for the patient portal called "MyChart".  Sign up information is provided on this After Visit Summary.  MyChart is used to connect with patients for  Virtual Visits (Telemedicine).  Patients are able to view lab/test results, encounter notes, upcoming appointments, etc.  Non-urgent messages can be sent to your provider as well.   To learn more about what you can do with MyChart, go to [email or web address].    Your next appointment:   2-3 week(s)  The format for your next appointment:   In Person  Provider:   You may see Sinclair Grooms, MD or one of the following Advanced Practice Providers on your designated Care Team:    Truitt Merle, NP  Cecilie Kicks, NP  Kathyrn Drown, NP    Other Instructions     [place] OFFICE [place], [place] [address] Dept: [place]: [place]  04/24/2019  You are scheduled for a Cardiac Catheterization on Monday, April 19 with Dr. Harrell Gave End.  1. Please arrive at the [facility] (Main Entrance A) at [place]: [place], [place] [address] at 5:30 AM (This time is two hours before your procedure to ensure your preparation). Free valet parking service is available.   Special note: Every effort is made to have your procedure done on time. Please understand that emergencies sometimes delay scheduled procedures.  2. Diet: Do not eat solid foods after midnight.  The patient may have clear liquids until 5am upon the day of the procedure.  3. Labs: You will have labs drawn today  4. Medication instructions in preparation for your procedure:   Contrast Allergy: No  DO NOT take your Glipizide,  Furosemide, Spironolactone or Toujeo the morning of your procedure.  On the morning of your procedure, take your Aspirin and Effient/Prasugrel and any morning medicines NOT listed above.  You may use sips of water.  5. Plan for one night stay--bring personal belongings. 6. Bring a current list of your medications and current insurance cards. 7.  You MUST have a responsible person to drive you home. 8. Someone MUST be with you the first 24 hours after you arrive home or your discharge will be delayed. 9. Please wear clothes that are easy to get on and off and wear slip-on shoes.  Thank you for allowing [place] to care for you!   -- [facility]

## 2019-04-27 ENCOUNTER — Ambulatory Visit (HOSPITAL_COMMUNITY)
Admission: RE | Admit: 2019-04-27 | Discharge: 2019-04-28 | Disposition: A | Discharge status: Home / Self Care | Payer: PRIVATE HEALTH INSURANCE | Attending: Internal Medicine | Admitting: Internal Medicine

## 2019-04-27 ENCOUNTER — Encounter (HOSPITAL_COMMUNITY)
Admission: RE | Disposition: A | Discharge status: Home / Self Care | Payer: PRIVATE HEALTH INSURANCE | Source: Home / Self Care | Attending: Internal Medicine

## 2019-04-27 ENCOUNTER — Other Ambulatory Visit: Payer: Self-pay

## 2019-04-27 DIAGNOSIS — E785 Hyperlipidemia, unspecified: Secondary | ICD-10-CM | POA: Diagnosis not present

## 2019-04-27 DIAGNOSIS — Z79899 Other long term (current) drug therapy: Secondary | ICD-10-CM | POA: Diagnosis not present

## 2019-04-27 DIAGNOSIS — F419 Anxiety disorder, unspecified: Secondary | ICD-10-CM | POA: Diagnosis not present

## 2019-04-27 DIAGNOSIS — Z951 Presence of aortocoronary bypass graft: Secondary | ICD-10-CM | POA: Diagnosis not present

## 2019-04-27 DIAGNOSIS — Z794 Long term (current) use of insulin: Secondary | ICD-10-CM | POA: Insufficient documentation

## 2019-04-27 DIAGNOSIS — Z880 Allergy status to penicillin: Secondary | ICD-10-CM | POA: Diagnosis not present

## 2019-04-27 DIAGNOSIS — Z7982 Long term (current) use of aspirin: Secondary | ICD-10-CM | POA: Insufficient documentation

## 2019-04-27 DIAGNOSIS — I130 Hypertensive heart and chronic kidney disease with heart failure and stage 1 through stage 4 chronic kidney disease, or unspecified chronic kidney disease: Secondary | ICD-10-CM | POA: Diagnosis not present

## 2019-04-27 DIAGNOSIS — Z9861 Coronary angioplasty status: Secondary | ICD-10-CM

## 2019-04-27 DIAGNOSIS — I10 Essential (primary) hypertension: Secondary | ICD-10-CM

## 2019-04-27 DIAGNOSIS — N189 Chronic kidney disease, unspecified: Secondary | ICD-10-CM | POA: Insufficient documentation

## 2019-04-27 DIAGNOSIS — E1122 Type 2 diabetes mellitus with diabetic chronic kidney disease: Secondary | ICD-10-CM | POA: Insufficient documentation

## 2019-04-27 DIAGNOSIS — I25110 Atherosclerotic heart disease of native coronary artery with unstable angina pectoris: Secondary | ICD-10-CM

## 2019-04-27 DIAGNOSIS — I480 Paroxysmal atrial fibrillation: Secondary | ICD-10-CM | POA: Diagnosis not present

## 2019-04-27 HISTORY — PX: INTRAVASCULAR ULTRASOUND/IVUS: CATH118244

## 2019-04-27 HISTORY — PX: CORONARY BALLOON ANGIOPLASTY: CATH118233

## 2019-04-27 HISTORY — DX: Atherosclerotic heart disease of native coronary artery with unstable angina pectoris: I25.110

## 2019-04-27 LAB — BASIC METABOLIC PANEL
Anion gap: 12 (ref 5–15)
BUN: 14 mg/dL (ref 8–23)
CO2: 25 mmol/L (ref 22–32)
Calcium: 9.6 mg/dL (ref 8.9–10.3)
Chloride: 103 mmol/L (ref 98–111)
Creatinine, Ser: 0.91 mg/dL (ref 0.61–1.24)
GFR calc Af Amer: >60 mL/min (ref >60)
GFR calc non Af Amer: >60 mL/min (ref >60)
Glucose, Bld: 123 mg/dL — ABNORMAL HIGH (ref 70–99)
Potassium: 4.2 mmol/L (ref 3.5–5.1)
Sodium: 140 mmol/L (ref 135–145)

## 2019-04-27 LAB — GLUCOSE, CAPILLARY
Glucose-Capillary: 140 mg/dL — ABNORMAL HIGH (ref 70–99)
Glucose-Capillary: 74 mg/dL (ref 70–99)
Glucose-Capillary: 75 mg/dL (ref 70–99)
Glucose-Capillary: 110 mg/dL — ABNORMAL HIGH (ref 70–99)

## 2019-04-27 LAB — POCT ACTIVATED CLOTTING TIME
Activated Clotting Time: 356 seconds
Activated Clotting Time: 131 seconds

## 2019-04-27 LAB — TROPONIN I (HIGH SENSITIVITY): Troponin I (High Sensitivity): 175 ng/L (ref <18)

## 2019-04-27 LAB — CBC
HCT: 43.6 % (ref 39.0–52.0)
Hemoglobin: 15.2 g/dL (ref 13.0–17.0)
MCH: 33 pg (ref 26.0–34.0)
MCHC: 34.9 g/dL (ref 30.0–36.0)
MCV: 94.8 fL (ref 80.0–100.0)
Platelets: 143 10*3/uL — ABNORMAL LOW (ref 150–400)
RBC: 4.6 MIL/uL (ref 4.22–5.81)
RDW: 12.7 % (ref 11.5–15.5)
WBC: 10.5 10*3/uL (ref 4.0–10.5)
nRBC: 0 % (ref 0.0–0.2)

## 2019-04-27 SURGERY — INTRAVASCULAR ULTRASOUND/IVUS
Anesthesia: LOCAL

## 2019-04-27 MED ORDER — SODIUM CHLORIDE 0.9 % WEIGHT BASED INFUSION
3.0000 mL/kg/h | INTRAVENOUS | Status: DC
  Administered 2019-04-27: 3 mL/kg/h via INTRAVENOUS

## 2019-04-27 MED ORDER — METOPROLOL TARTRATE 100 MG PO TABS
100.0000 mg | ORAL_TABLET | Freq: Three times a day (TID) | ORAL | Status: DC
  Administered 2019-04-27 – 2019-04-28 (×2): 100 mg via ORAL
  Filled 2019-04-27 (×3): qty 1

## 2019-04-27 MED ORDER — ASPIRIN 81 MG PO CHEW: 81.0000 mg | CHEWABLE_TABLET | ORAL | Status: DC

## 2019-04-27 MED ORDER — BIVALIRUDIN TRIFLUOROACETATE 250 MG IV SOLR
INTRAVENOUS | Status: AC
  Filled 2019-04-27: qty 250

## 2019-04-27 MED ORDER — MAGNESIUM OXIDE 400 (241.3 MG) MG PO TABS
800.0000 mg | ORAL_TABLET | Freq: Three times a day (TID) | ORAL | Status: DC
  Administered 2019-04-27 – 2019-04-28 (×2): 800 mg via ORAL
  Filled 2019-04-27 (×2): qty 2

## 2019-04-27 MED ORDER — SODIUM CHLORIDE 0.9 % IV SOLN
INTRAVENOUS | Status: DC | PRN
  Administered 2019-04-27 (×2): 1.75 mg/kg/h via INTRAVENOUS

## 2019-04-27 MED ORDER — SODIUM CHLORIDE 0.9% FLUSH: 3.0000 mL | Freq: Two times a day (BID) | INTRAVENOUS | Status: DC

## 2019-04-27 MED ORDER — MORPHINE SULFATE (PF) 2 MG/ML IV SOLN
INTRAVENOUS | Status: AC
  Filled 2019-04-27: qty 1

## 2019-04-27 MED ORDER — NITROGLYCERIN 1 MG/10 ML FOR IR/CATH LAB
INTRA_ARTERIAL | Status: DC | PRN
  Administered 2019-04-27 (×6): 200 ug via INTRACORONARY

## 2019-04-27 MED ORDER — BIVALIRUDIN BOLUS VIA INFUSION - CUPID
INTRAVENOUS | Status: DC | PRN
  Administered 2019-04-27: 55.5 mg via INTRAVENOUS

## 2019-04-27 MED ORDER — MORPHINE SULFATE (PF) 2 MG/ML IV SOLN
2.0000 mg | INTRAVENOUS | Status: DC | PRN
  Administered 2019-04-27: 2 mg via INTRAVENOUS

## 2019-04-27 MED ORDER — OXYCODONE HCL 5 MG PO TABS: 5.0000 mg | ORAL_TABLET | ORAL | Status: DC | PRN

## 2019-04-27 MED ORDER — ATORVASTATIN CALCIUM 80 MG PO TABS
80.0000 mg | ORAL_TABLET | Freq: Every day | ORAL | Status: DC
  Administered 2019-04-27: 80 mg via ORAL
  Filled 2019-04-27: qty 1

## 2019-04-27 MED ORDER — SODIUM CHLORIDE 0.9 % IV SOLN: INTRAVENOUS | Status: AC

## 2019-04-27 MED ORDER — INSULIN ASPART 100 UNIT/ML ~~LOC~~ SOLN
0.0000 [IU] | Freq: Three times a day (TID) | SUBCUTANEOUS | Status: DC
  Administered 2019-04-28: 3 [IU] via SUBCUTANEOUS

## 2019-04-27 MED ORDER — SODIUM CHLORIDE 0.9 % WEIGHT BASED INFUSION: 1.0000 mL/kg/h | INTRAVENOUS | Status: DC

## 2019-04-27 MED ORDER — ONDANSETRON HCL 4 MG/2ML IJ SOLN: 4.0000 mg | Freq: Four times a day (QID) | INTRAMUSCULAR | Status: DC | PRN

## 2019-04-27 MED ORDER — MORPHINE SULFATE (PF) 2 MG/ML IV SOLN
2.0000 mg | Freq: Once | INTRAVENOUS | Status: AC
  Administered 2019-04-27: 2 mg via INTRAVENOUS

## 2019-04-27 MED ORDER — MIDAZOLAM HCL 2 MG/2ML IJ SOLN
INTRAMUSCULAR | Status: AC
  Filled 2019-04-27: qty 2

## 2019-04-27 MED ORDER — OXYCODONE-ACETAMINOPHEN 10-325 MG PO TABS: 1.0000 | ORAL_TABLET | ORAL | Status: DC | PRN

## 2019-04-27 MED ORDER — SODIUM CHLORIDE 0.9 % IV SOLN: 250.0000 mL | INTRAVENOUS | Status: DC | PRN

## 2019-04-27 MED ORDER — PRASUGREL HCL 10 MG PO TABS
10.0000 mg | ORAL_TABLET | Freq: Every day | ORAL | Status: DC
  Administered 2019-04-28: 10 mg via ORAL
  Filled 2019-04-27: qty 1

## 2019-04-27 MED ORDER — INSULIN GLARGINE 100 UNIT/ML ~~LOC~~ SOLN
26.0000 [IU] | Freq: Every day | SUBCUTANEOUS | Status: DC
  Administered 2019-04-28: 26 [IU] via SUBCUTANEOUS
  Filled 2019-04-27: qty 0.26

## 2019-04-27 MED ORDER — NITROGLYCERIN 0.4 MG SL SUBL
SUBLINGUAL_TABLET | SUBLINGUAL | Status: AC
  Administered 2019-04-27: 0.4 mg via SUBLINGUAL
  Filled 2019-04-27: qty 1

## 2019-04-27 MED ORDER — SODIUM CHLORIDE 0.9% FLUSH: 3.0000 mL | INTRAVENOUS | Status: DC | PRN

## 2019-04-27 MED ORDER — PROMETHAZINE HCL 25 MG PO TABS: 25.0000 mg | ORAL_TABLET | Freq: Two times a day (BID) | ORAL | Status: DC | PRN

## 2019-04-27 MED ORDER — MIDAZOLAM HCL 2 MG/2ML IJ SOLN
INTRAMUSCULAR | Status: DC | PRN
  Administered 2019-04-27 (×3): 1 mg via INTRAVENOUS

## 2019-04-27 MED ORDER — HEPARIN (PORCINE) IN NACL 1000-0.9 UT/500ML-% IV SOLN
INTRAVENOUS | Status: DC | PRN
  Administered 2019-04-27 (×3): 500 mL

## 2019-04-27 MED ORDER — IOHEXOL 350 MG/ML SOLN
INTRAVENOUS | Status: AC
  Filled 2019-04-27: qty 1

## 2019-04-27 MED ORDER — IOHEXOL 350 MG/ML SOLN
INTRAVENOUS | Status: DC | PRN
  Administered 2019-04-27: 120 mL via INTRA_ARTERIAL

## 2019-04-27 MED ORDER — FENTANYL CITRATE (PF) 100 MCG/2ML IJ SOLN
INTRAMUSCULAR | Status: DC | PRN
  Administered 2019-04-27: 25 ug via INTRAVENOUS
  Administered 2019-04-27: 50 ug via INTRAVENOUS
  Administered 2019-04-27 (×2): 25 ug via INTRAVENOUS

## 2019-04-27 MED ORDER — NITROGLYCERIN IN D5W 200-5 MCG/ML-% IV SOLN
0.0000 ug/min | INTRAVENOUS | Status: DC
  Administered 2019-04-27: 5 ug/min via INTRAVENOUS
  Filled 2019-04-27: qty 250

## 2019-04-27 MED ORDER — FENTANYL CITRATE (PF) 100 MCG/2ML IJ SOLN
INTRAMUSCULAR | Status: AC
  Filled 2019-04-27: qty 2

## 2019-04-27 MED ORDER — ASPIRIN EC 81 MG PO TBEC
81.0000 mg | DELAYED_RELEASE_TABLET | Freq: Every day | ORAL | Status: DC
  Administered 2019-04-28: 81 mg via ORAL
  Filled 2019-04-27: qty 1

## 2019-04-27 MED ORDER — LIDOCAINE HCL (PF) 1 % IJ SOLN
INTRAMUSCULAR | Status: AC
  Filled 2019-04-27: qty 30

## 2019-04-27 MED ORDER — TIZANIDINE HCL 4 MG PO TABS
8.0000 mg | ORAL_TABLET | Freq: Every day | ORAL | Status: DC
  Administered 2019-04-27: 8 mg via ORAL
  Filled 2019-04-27 (×2): qty 2

## 2019-04-27 MED ORDER — AMLODIPINE BESYLATE 5 MG PO TABS
5.0000 mg | ORAL_TABLET | Freq: Every day | ORAL | Status: DC
  Administered 2019-04-28: 5 mg via ORAL
  Filled 2019-04-27: qty 1

## 2019-04-27 MED ORDER — NITROGLYCERIN 1 MG/10 ML FOR IR/CATH LAB
INTRA_ARTERIAL | Status: AC
  Filled 2019-04-27: qty 10

## 2019-04-27 MED ORDER — LIDOCAINE HCL (PF) 1 % IJ SOLN
INTRAMUSCULAR | Status: DC | PRN
  Administered 2019-04-27: 5 mL via INTRADERMAL

## 2019-04-27 MED ORDER — SPIRONOLACTONE 25 MG PO TABS
25.0000 mg | ORAL_TABLET | Freq: Every day | ORAL | Status: DC
  Administered 2019-04-28: 25 mg via ORAL
  Filled 2019-04-27: qty 1

## 2019-04-27 MED ORDER — ENOXAPARIN SODIUM 40 MG/0.4ML ~~LOC~~ SOLN: 40.0000 mg | SUBCUTANEOUS | Status: DC

## 2019-04-27 MED ORDER — HEPARIN (PORCINE) IN NACL 1000-0.9 UT/500ML-% IV SOLN
INTRAVENOUS | Status: AC
  Filled 2019-04-27: qty 1000

## 2019-04-27 MED ORDER — ACETAMINOPHEN 325 MG PO TABS: 650.0000 mg | ORAL_TABLET | ORAL | Status: DC | PRN

## 2019-04-27 MED ORDER — GABAPENTIN 300 MG PO CAPS
900.0000 mg | ORAL_CAPSULE | Freq: Two times a day (BID) | ORAL | Status: DC
  Administered 2019-04-27 – 2019-04-28 (×2): 900 mg via ORAL
  Filled 2019-04-27 (×2): qty 3

## 2019-04-27 MED ORDER — SODIUM CHLORIDE 0.9% FLUSH
3.0000 mL | Freq: Two times a day (BID) | INTRAVENOUS | Status: DC
  Administered 2019-04-27: 3 mL via INTRAVENOUS

## 2019-04-27 MED ORDER — NITROGLYCERIN 0.4 MG SL SUBL
0.4000 mg | SUBLINGUAL_TABLET | SUBLINGUAL | Status: DC | PRN
  Administered 2019-04-27: 0.4 mg via SUBLINGUAL
  Filled 2019-04-27: qty 1

## 2019-04-27 MED ORDER — PANTOPRAZOLE SODIUM 40 MG PO TBEC
40.0000 mg | DELAYED_RELEASE_TABLET | Freq: Every day | ORAL | Status: DC
  Administered 2019-04-28: 40 mg via ORAL
  Filled 2019-04-27: qty 1

## 2019-04-27 MED ORDER — OXYCODONE-ACETAMINOPHEN 5-325 MG PO TABS: 1.0000 | ORAL_TABLET | ORAL | Status: DC | PRN

## 2019-04-27 MED ORDER — LABETALOL HCL 5 MG/ML IV SOLN: 10.0000 mg | INTRAVENOUS | Status: AC | PRN

## 2019-04-27 MED ORDER — NITROGLYCERIN 0.4 MG/SPRAY TL SOLN
Status: AC
  Filled 2019-04-27: qty 4.9

## 2019-04-27 MED ORDER — HYDRALAZINE HCL 20 MG/ML IJ SOLN: 10.0000 mg | INTRAMUSCULAR | Status: AC | PRN

## 2019-04-27 MED ORDER — TIZANIDINE HCL 4 MG PO TABS
4.0000 mg | ORAL_TABLET | Freq: Two times a day (BID) | ORAL | Status: DC
  Administered 2019-04-28: 4 mg via ORAL
  Filled 2019-04-27 (×2): qty 1

## 2019-04-27 MED ORDER — ISOSORBIDE MONONITRATE ER 60 MG PO TB24: 120.0000 mg | ORAL_TABLET | Freq: Every day | ORAL | Status: DC

## 2019-04-27 SURGICAL SUPPLY — 19 items
BALLN SAPPHIRE ~~LOC~~ 2.25X15 (BALLOONS) ×1 IMPLANT
BALLN SAPPHIRE ~~LOC~~ 2.75X15 (BALLOONS) ×1 IMPLANT
BALLN SAPPHIRE ~~LOC~~ 3.25X12 (BALLOONS) ×1 IMPLANT
CATH LAUNCHER 6FR EBU 3.75 (CATHETERS) ×1 IMPLANT
CATH OPTICROSS HD (CATHETERS) ×1 IMPLANT
CATH SHOCKWAVE 3.0X12 (CATHETERS) IMPLANT
CATHETER SHOCKWAVE 3.0X12 (CATHETERS) ×2
COVER SWIFTLINK CONNECTOR (BAG) ×1 IMPLANT
KIT ENCORE 26 ADVANTAGE (KITS) ×1 IMPLANT
KIT HEART LEFT (KITS) ×2 IMPLANT
KIT MICROPUNCTURE NIT STIFF (SHEATH) ×1 IMPLANT
PACK CARDIAC CATHETERIZATION (CUSTOM PROCEDURE TRAY) ×2 IMPLANT
SHEATH PINNACLE 6F 10CM (SHEATH) ×1 IMPLANT
SHEATH PROBE COVER 6X72 (BAG) ×1 IMPLANT
SLED PULL BACK IVUS (MISCELLANEOUS) ×1 IMPLANT
TRANSDUCER W/STOPCOCK (MISCELLANEOUS) ×2 IMPLANT
TUBING CIL FLEX 10 FLL-RA (TUBING) ×2 IMPLANT
WIRE EMERALD 3MM-J .035X150CM (WIRE) ×1 IMPLANT
WIRE RUNTHROUGH .014X180CM (WIRE) ×1 IMPLANT

## 2019-04-27 NOTE — Progress Notes (Addendum)
Site area: Right groin a 6 french arterial sheath was removed Blane Ohara RN-2H Site Prior to Removal:  Level 0  Pressure Applied For 45 MINUTES    Bedrest Beginning at 1350pm  Manual:   Yes.    Patient Status During Pull:  stable  Post Pull Groin Site:  Level 0  Post Pull Instructions Given:  Yes.    Post Pull Pulses Present:  Yes.    Dressing Applied:  Yes.    Comments:  Pt complained of chest pain a 8 out of 10. Pt begin to screen and was advised try not too because it makes holding his groin harder to hold, and we did not want to upset the other patient.  Pt became upset.  EKG done and Dr End was called.  A order was given for Morphine 2 mg IV for chest pain and groin discomfort.   Medication effective.

## 2019-04-27 NOTE — Interval H&P Note (Signed)
History and Physical Interval Note:  04/27/2019 7:19 AM  Terry Nelson  has presented today for surgery, with the diagnosis of coronary artery disease with unstable angina.  The various methods of treatment have been discussed with the patient and family. After consideration of risks, benefits and other options for treatment, the patient has consented to  Procedure(s): CORONARY STENT INTERVENTION (N/A) as a surgical intervention.  The patient's history has been reviewed, patient examined, no change in status, stable for surgery.  I have reviewed the patient's chart and labs.  Questions were answered to the patient's satisfaction.    Cath Lab Visit (complete for each Cath Lab visit)  Clinical Evaluation Leading to the Procedure:   ACS: No.  Non-ACS:    Anginal Classification: CCS IV  Anti-ischemic medical therapy: Maximal Therapy (2 or more classes of medications)  Non-Invasive Test Results: No non-invasive testing performed  Prior CABG: Previous CABG  Rosabelle Jupin

## 2019-04-27 NOTE — Progress Notes (Addendum)
Paged by nurse regarding 10/10 chest pain, will give morphine, stop Imdur 120 mg daily, start IV nitro. Obtain EKG to make sure no acute reocclusion.  First EKG showed TWI in anterior leads.  Repeat EKG 30 min later continue to show the same TWI in anterior leads, there are no evolutionary changes. Chest pain improved on IV nitro.   If still doing ok on IV nitro in AM, recommend stop IV nitro and switch back to Imdur 120mg  daily.

## 2019-04-27 NOTE — Progress Notes (Signed)
Patient reports that he has taken 2 tabs of NTG prior to admission this morning. Complaining of chest pressure this morning. 3rd NTG given in SSC. EKG done. 4L O2 via [place] strated per standing orders.

## 2019-04-28 DIAGNOSIS — E785 Hyperlipidemia, unspecified: Secondary | ICD-10-CM

## 2019-04-28 DIAGNOSIS — I25110 Atherosclerotic heart disease of native coronary artery with unstable angina pectoris: Secondary | ICD-10-CM | POA: Diagnosis not present

## 2019-04-28 DIAGNOSIS — I10 Essential (primary) hypertension: Secondary | ICD-10-CM

## 2019-04-28 LAB — GLUCOSE, CAPILLARY
Glucose-Capillary: 151 mg/dL — ABNORMAL HIGH (ref 70–99)
Glucose-Capillary: 242 mg/dL — ABNORMAL HIGH (ref 70–99)

## 2019-04-28 LAB — TROPONIN I (HIGH SENSITIVITY): Troponin I (High Sensitivity): 205 ng/L (ref <18)

## 2019-04-28 NOTE — Discharge Summary (Addendum)
The patient has been seen in conjunction with Laverda Page, NP. All aspects of care have been considered and discussed. The patient has been personally interviewed, examined, and all clinical data has been reviewed.   Patient had one episode of angina after extensive angioplasty and shockwave PCI on the restenosis lesions in the LAD "full metal jacket".  He has ambulated this a.m. without angina, the first time in greater than a month.  The right femoral access site is unremarkable.  Plan discharge today.  Aggressive risk factor modification.  Continue aspirin and Effient indefinitely.   Discharge Summary    Patient ID: Terry Nelson,  MRN: [id number], DOB/AGE: [birth year] 11 y.o.  Admit date: 04/27/2019 Discharge date: 04/28/2019  Primary Care Provider: Jiles Harold Primary Cardiologist: Lesleigh Noe, MD  Discharge Diagnoses    Principal Problem:   Coronary artery disease involving native coronary artery of native heart with unstable angina pectoris [facility]) Active Problems:   Hyperlipidemia LDL goal <70   Hypertension   Allergies Allergies  Allergen Reactions  . Metoclopramide Other (See Comments)    DYSKINESIAS  . Penicillins Swelling and Rash    Did it involve swelling of the face/tongue/throat, SOB, or low BP? Yes Did it involve sudden or severe rash/hives, skin peeling, or any reaction on the inside of your mouth or nose? Unknown Did you need to seek medical attention at a hospital or doctor's office? Yes When did it last happen?More than 40 years ago If all above answers are "NO", may proceed with cephalosporin use.     Diagnostic Studies/Procedures    Cath: 04/27/19  Conclusions: 1. Multifocal in-stent restenosis of long segment of previously treated LAD (proximal through apical vessel) with up to 70-80% narrowing, as detailed on diagnostic catheterization by Dr. Katrinka Blazing on 04/09/2019. 2. Successful IVUS guided PTCA of in-stent restenosis involving  the long segment of LAD stent using standard balloons and lithotripsy.  Final angiogram shows improvement in stenosis from 70-80% to 20-30% with TIMI 2 flow (improved from pre-PTCA angiogram).  Recommendations: 1. Remove right femoral artery sheath with manual compression 2 hours after discontinuation of bivalirudin at end of PTCA. 2. Indefinite dual antiplatelet therapy with aspirin and prasugrel. 3. Aggressive secondary prevention. 4. Overnight extended recovery.  Yvonne Kendall, MD [facility]  Diagnostic Dominance: Right  Intervention    _____________   History of Present Illness     Terry Nelson is a 64 y.o. male with a hx of CAD,CABG in 2002 by Dr. Raiford Noble, HTN, HL, and CKD. 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  Heavily stented native LAD.  Underwent cardiac cath 04/09/19 with known atretic LIMA- LAD, patent SVG-to 1st/2nd OM. Recurring episodes of angina.  Poorly responsive to nitroglycerin. Gradual worsening since coronary angiography was performed on 04/09/2019.  Intervention was not done because it was not an obvious site to intervene upon unless it was within moderate in-stent restenosis in LAD.  He was seen back in the office for follow up with Dr. Katrinka Blazing. Since the above-noted cath, anginal symptoms had gotten worse.  He felt his physicians in [place] what have been able to do some intervention to relieve his current easily provokable angina.  We had received a CD-ROM/TAVR of prior images from the previous hospital in [place], however the images were not loaded on the disc. We have received the new disc, and this was reviewed by Dr. Katrinka Blazing. Upon reviewing images he decided to go ahead and schedule  him for repeat cath and PCI the following week.   Hospital Course     He presented for outpatient cardiac cath noted above with Dr. Okey Dupre on 04/27/19 with known multifocal ISR of long previously treated LAD (proximal through the apical vessel). Had  successful IVUS guided PTCA of ISR of the LAD with balloon angioplasty and lithotripsy. Improvement in stenosis from 70-80% to 20-30% with TIMI 2 flow. Plan to continue on DAPT with ASA/Effient indefinitely give degree of stenting. Continue aggressive secondary prevention. Morning labs were stable. Worked with cardiac rehab without recurrent chest pain. He was continued on his home medications without significant change.   General: Well developed, well nourished, male appearing in no acute distress. Head: Normocephalic, atraumatic.  Neck: Supple without bruits, JVD. Lungs:  Resp regular and unlabored, CTA. Heart: RRR, S1, S2, no S3, S4, or murmur; no rub. Abdomen: Soft, non-tender, non-distended with normoactive bowel sounds. No hepatomegaly. No rebound/guarding. No obvious abdominal masses. Extremities: No clubbing, cyanosis, edema. Distal pedal pulses are 2+ bilaterally. Right femoral cath site stable without bruising or hematoma Neuro: Alert and oriented X 3. Moves all extremities spontaneously. Psych: Normal affect.  Terry Nelson was seen by Dr. Katrinka Blazing and determined stable for discharge home. Follow up in the office has been arranged. Medications are listed below.   _____________  Discharge Vitals Blood pressure 111/60, pulse 64, temperature 99.5 F (37.5 C), temperature source Oral, resp. rate 13, height 5\' 7"  (1.702 m), weight 69.2 kg, SpO2 98 %.  Filed Weights   04/27/19 0600 04/28/19 0506  Weight: 74 kg 69.2 kg    Labs & Radiologic Studies    CBC Recent Labs    04/27/19 2318  WBC 10.5  HGB 15.2  HCT 43.6  MCV 94.8  PLT 143*   Basic Metabolic Panel Recent Labs    04/29/19 2318  NA 140  K 4.2  CL 103  CO2 25  GLUCOSE 123*  BUN 14  CREATININE 0.91  CALCIUM 9.6   Liver Function Tests No results for input(s): AST, ALT, ALKPHOS, BILITOT, PROT, ALBUMIN in the last 72 hours. No results for input(s): LIPASE, AMYLASE in the last 72 hours. Cardiac Enzymes No results  for input(s): CKTOTAL, CKMB, CKMBINDEX, TROPONINI in the last 72 hours. BNP Invalid input(s): POCBNP D-Dimer No results for input(s): DDIMER in the last 72 hours. Hemoglobin A1C No results for input(s): HGBA1C in the last 72 hours. Fasting Lipid Panel No results for input(s): CHOL, HDL, LDLCALC, TRIG, CHOLHDL, LDLDIRECT in the last 72 hours. Thyroid Function Tests No results for input(s): TSH, T4TOTAL, T3FREE, THYROIDAB in the last 72 hours.  Invalid input(s): FREET3 _____________  DG Chest 2 View  Result Date: 04/21/2019 CLINICAL DATA:  Chest pain EXAM: CHEST - 2 VIEW COMPARISON:  11/07/2003 FINDINGS: Post sternotomy changes. Right-sided central venous catheter with tip projecting over proximal right atrium. Probable calcified coronary graft. Normal heart size. No pneumothorax. Small electronic device over the left lower chest. IMPRESSION: 1. Right IJ central venous catheter tip projects over the right atrium 2. No acute infiltrate or edema. Electronically Signed   By: 11/09/2003 M.D.   On: 04/21/2019 19:46   CARDIAC CATHETERIZATION  Result Date: 04/27/2019 Conclusions: 1. Multifocal in-stent restenosis of long segment of previously treated LAD (proximal through apical vessel) with up to 70-80% narrowing, as detailed on diagnostic catheterization by Dr. 04/29/2019 on 04/09/2019. 2. Successful IVUS guided PTCA of in-stent restenosis involving the long segment of LAD stent using standard balloons and  lithotripsy.  Final angiogram shows improvement in stenosis from 70-80% to 20-30% with TIMI 2 flow (improved from pre-PTCA angiogram). Recommendations: 1. Remove right femoral artery sheath with manual compression 2 hours after discontinuation of bivalirudin at end of PTCA. 2. Indefinite dual antiplatelet therapy with aspirin and prasugrel. 3. Aggressive secondary prevention. 4. Overnight extended recovery. Yvonne Kendall, MD [facility]   CARDIAC CATHETERIZATION  Result Date: 04/09/2019  Atretic  left internal mammary graft to LAD  Patent sequential saphenous vein graft to the first and second obtuse marginal.  Patent left main.  LAD has overlapping stents from the proximal vessel to the apex.  Cinefluoroscopy indicates regions of stent underexpansion and overlap.  Angiography demonstrates multiple areas of diffuse bulky plaque with obstruction 50 to 75% in different locations.  The native circumflex is patent.  The first obtuse marginal contains diffuse 90% stenosis proximal to the side-to-side anastomosis with the vein graft.  The right coronary is codominant, contains moderate to severe diffuse disease, and has an overlapping patent stent from distal RCA into the PDA.  Left ventriculography reveals mid anterior wall moderate to severe hypokinesis.  Estimated EF 50% with normal LVEDP. RECOMMENDATIONS:  We obtained a DVR from [facility] in [place].  We were unable to get the disc to play.  It is impossible to know specifically what to do until old images of the LAD can be reviewed.  LAD is clearly not a vessel that can be bypassed at this point.  It sounds like long-term patency has been achieved by repeat angioplasty.  Decided against diving in and doing angioplasty of the entire stented segment.  We will give another attempt at getting images from [facility], compared to these images, and have a more targeted approach.  He clearly has areas of stent underexpansion based upon fluoroscopy.  Perhaps there will be a role for shockwave lithotripsy to help modify plaque and allow stent expansion in these areas.  Continue lifelong aspirin and Effient.  Patient has significant pain and intolerance of procedures.  Injecting the LIMA led to excruciating pain.  The catheter and the left radial hurt the entire procedure despite having used more than 3 times the usual amount of lidocaine.  Disposition   Pt is being discharged home today in good condition.  Follow-up Plans & Appointments     Follow-up Information    Lyn Records, MD Follow up on 05/11/2019.   Specialty: Cardiology Why: at 11:40am for your follow up appt. Contact information: [address] [place] [address] [phone]          Discharge Instructions    AMB Referral to Cardiac Rehabilitation - Phase II   Complete by: As directed    Diagnosis: PTCA   After initial evaluation and assessments completed: Virtual Based Care may be provided alone or in conjunction with Phase 2 Cardiac Rehab based on patient barriers.: Yes     Discharge Medications     Medication List    TAKE these medications   amLODipine 5 MG tablet Commonly known as: NORVASC Take 5 mg by mouth daily.   aspirin EC 81 MG tablet Take 81 mg by mouth daily.   atorvastatin 80 MG tablet Commonly known as: LIPITOR Take 80 mg by mouth at bedtime.   Blink Tears 0.25 % Soln Generic drug: Polyethylene Glycol 400 Place 1 drop into both eyes 3 (three) times daily as needed (DRY/IRRITATED EYES).   furosemide 40 MG tablet Commonly known as: LASIX  Take 20 mg by mouth every other day.   gabapentin 300 MG capsule Commonly known as: NEURONTIN Take 900 mg by mouth 2 (two) times daily.   glipiZIDE 10 MG 24 hr tablet Commonly known as: GLUCOTROL XL Take 10 mg by mouth 2 (two) times daily.   isosorbide mononitrate 60 MG 24 hr tablet Commonly known as: IMDUR Take 120 mg by mouth daily.   magnesium oxide 400 MG tablet Commonly known as: MAG-OX Take 800 mg by mouth in the morning, at noon, and at bedtime. TAKE 2 TABLETS BY MOUTH THREE TIMES DAILY   metoprolol tartrate 100 MG tablet Commonly known as: LOPRESSOR Take 100 mg by mouth in the morning, at noon, and at bedtime.   nitroGLYCERIN 0.4 MG SL tablet Commonly known as: NITROSTAT Place 0.4 mg under the tongue every 5 (five) minutes as needed for chest pain.   oxyCODONE-acetaminophen 10-325 MG tablet Commonly known as: PERCOCET Take 1 tablet by mouth  every 4 (four) hours as needed for pain.   pantoprazole 40 MG tablet Commonly known as: PROTONIX Take 40 mg by mouth daily.   prasugrel 10 MG Tabs tablet Commonly known as: EFFIENT Take 10 mg by mouth daily.   promethazine 25 MG tablet Commonly known as: PHENERGAN Take 25 mg by mouth 2 (two) times daily as needed for nausea or vomiting.   spironolactone 25 MG tablet Commonly known as: ALDACTONE Take 25 mg by mouth daily.   tiZANidine 4 MG tablet Commonly known as: ZANAFLEX Take 4-8 mg by mouth See admin instructions. Take 1 tablet (4mg ) by mouth twice daily & Take 2 tablets (8 mg) by mouth at bedtime   Toujeo SoloStar 300 UNIT/ML Solostar Pen Generic drug: insulin glargine (1 Unit Dial) Inject 26 Units into the skin daily.        No                               Did the patient have a percutaneous coronary intervention (stent / angioplasty)?:  Yes.     Cath/PCI Registry Performance & Quality Measures: 5. Aspirin prescribed? - Yes 6. ADP Receptor Inhibitor (Plavix/Clopidogrel, Brilinta/Ticagrelor or Effient/Prasugrel) prescribed (includes medically managed patients)? - Yes 7. High Intensity Statin (Lipitor 40-80mg  or Crestor 20-40mg ) prescribed? - Yes 8. For EF <40%, was ACEI/ARB prescribed? - No 9. For EF <40%, Aldosterone Antagonist (Spironolactone or Eplerenone) prescribed? - Not Applicable (EF >/= 16%) 10. Cardiac Rehab Phase II ordered (Included Medically managed Patients)? - Yes    Outstanding Labs/Studies   N/a   Duration of Discharge Encounter   Greater than 30 minutes including physician time.  Signed, Reino Bellis NP-C 04/28/2019, 8:51 AM

## 2019-04-28 NOTE — Progress Notes (Signed)
4/19 @ 2110, pt requested IV nitroglycerin be turned off.  He stated he had no chest pain and the only thing nitroglycerin does is cause him to have a headache.  IV nitroglycerin discontinued.  Will continue to monitor pt closely.

## 2019-04-28 NOTE — Progress Notes (Signed)
CARDIAC REHAB PHASE I   PRE:  Rate/Rhythm: 66 SR  BP:  Sitting: 148/83      SaO2: 99 RA  MODE:  Ambulation: 370 ft   POST:  Rate/Rhythm: 76 SR  BP:  Sitting: 143/81    SaO2: 96 RA  Pt ambulated 356ft in hallway standby assist with front wheel walker. Pt took one short standing rest break 1/2 way through, denied any reason as to why. Pt states he feels improvement in the his breathing. Pt educated on importance of ASA and Effient. Pt given heart healthy and diabetic diets. Reviewed restrictions, site care, and exercise guidelines. Will refer to CRP II [place], pt not interested in attending.  [id number] Reynold Bowen, RN BSN 04/28/2019 8:55 AM

## 2019-04-29 ENCOUNTER — Telehealth (HOSPITAL_COMMUNITY): Payer: Self-pay

## 2019-04-29 NOTE — Telephone Encounter (Signed)
Faxed referral to [facility] for Cardiac Rehab.  

## 2019-05-10 NOTE — Progress Notes (Addendum)
Cardiology Office Note:    Date:  05/11/2019   ID:  PORTER MOES, DOB 07-Jun-1955, MRN [id number]  PCP:  Jiles Harold, PA-C  Cardiologist:  Lesleigh Noe, MD   Referring MD: Jiles Harold, PA-C   Chief Complaint  Patient presents with  . Coronary Artery Disease    History of Present Illness:    Terry Nelson is a 64 y.o. male with a hx of CAD,CABG in 2002 by Dr. Cherene Altes overlapping proximal to distal LAD stents with ISR, PAF, Htn, HL, and CKD.  Having less angina than prior to redilatation of full metal jacket in LAD.  Angina can occur at random.  Minimal nitroglycerin use.  No prolonged episodes of pain.  Worse episode of angina occurred on Tuesday April 27 after PTCA was done on Friday, April 23.  He has had no angina since April 27.  Discussed the possibility that drug-eluting balloon could be used in the future once it is FDA approved.  The most recent procedure was not associated with new stent implantation.  Past Medical History:  Diagnosis Date  . Anxiety state   . Arteriosclerosis of coronary artery   . Body mass index (bmi) 26.0-26.9, adult   . Chest pain   . CHF (congestive heart failure) (HCC)   . Chronic renal impairment   . DDD (degenerative disc disease), lumbar   . DM (diabetes mellitus) (HCC)   . Gastritis   . Headache   . Hemorrhage   . Hyperlipidemia   . Hypertension   . Kidney stones   . Lumbago   . Neck pain   . Paroxysmal A-fib (HCC)   . Pernicious anemia   . Unstable angina [facility])     Past Surgical History:  Procedure Laterality Date  . ABLATION  2017  . CARDIAC CATHETERIZATION    . CORONARY ARTERY BYPASS GRAFT    . CORONARY BALLOON ANGIOPLASTY N/A 04/27/2019   Procedure: CORONARY BALLOON ANGIOPLASTY;  Surgeon: Yvonne Kendall, MD;  [facility];  Service: Cardiovascular;  Laterality: N/A;  . INTRAVASCULAR ULTRASOUND/IVUS N/A 04/27/2019   Procedure: Intravascular Ultrasound/IVUS;  Surgeon: Yvonne Kendall,  MD;  Location: [facility] INV[facility] CV LAB;  Service: Cardiovascular;  Laterality: N/A;  . LEFT HEART CATH AND CORS/GRAFTS ANGIOGRAPHY N/A 04/09/2019   Procedure: LEFT HEART CATH AND CORS/GRAFTS ANGIOGRAPHY;  Surgeon: Lyn Records, MD;  Location: [facility];  Service: Cardiovascular;  Laterality: N/A;    Current Medications: Current Meds  Medication Sig  . amLODipine (NORVASC) 5 MG tablet Take 5 mg by mouth daily.   Marland Kitchen aspirin EC 81 MG tablet Take 81 mg by mouth daily.  Marland Kitchen atorvastatin (LIPITOR) 80 MG tablet Take 80 mg by mouth at bedtime.   . furosemide (LASIX) 40 MG tablet Take 20 mg by mouth every other day.   . gabapentin (NEURONTIN) 300 MG capsule Take 900 mg by mouth 2 (two) times daily.   Marland Kitchen glipiZIDE (GLUCOTROL XL) 10 MG 24 hr tablet Take 10 mg by mouth 2 (two) times daily.  . isosorbide mononitrate (IMDUR) 60 MG 24 hr tablet Take 120 mg by mouth daily.  . magnesium oxide (MAG-OX) 400 MG tablet Take 800 mg by mouth in the morning, at noon, and at bedtime. TAKE 2 TABLETS BY MOUTH THREE TIMES DAILY   . metoprolol tartrate (LOPRESSOR) 100 MG tablet Take 100 mg by mouth in the morning, at noon, and at bedtime.  . nitroGLYCERIN (NITROSTAT) 0.4 MG SL tablet Place  0.4 mg under the tongue every 5 (five) minutes as needed for chest pain.  Marland Kitchen oxyCODONE-acetaminophen (PERCOCET) 10-325 MG tablet Take 1 tablet by mouth every 4 (four) hours as needed for pain.  . pantoprazole (PROTONIX) 40 MG tablet Take 40 mg by mouth daily.  . Polyethylene Glycol 400 (BLINK TEARS) 0.25 % SOLN Place 1 drop into both eyes 3 (three) times daily as needed (DRY/IRRITATED EYES).  . prasugrel (EFFIENT) 10 MG TABS tablet Take 10 mg by mouth daily.  . promethazine (PHENERGAN) 25 MG tablet Take 25 mg by mouth 2 (two) times daily as needed for nausea or vomiting.  Marland Kitchen spironolactone (ALDACTONE) 25 MG tablet Take 25 mg by mouth daily.  Marland Kitchen tiZANidine (ZANAFLEX) 4 MG tablet Take 4-8 mg by mouth See admin instructions. Take 1 tablet  (4mg ) by mouth twice daily & Take 2 tablets (8 mg) by mouth at bedtime  . TOUJEO SOLOSTAR 300 UNIT/ML Solostar Pen Inject 26 Units into the skin daily.     Allergies:   Metoclopramide and Penicillins   Social History   Socioeconomic History  . Marital status: Married    Spouse name: Not on file  . Number of children: Not on file  . Years of education: Not on file  . Highest education level: Not on file  Occupational History  . Not on file  Tobacco Use  . Smoking status: Never Smoker  . Smokeless tobacco: Never Used  Substance and Sexual Activity  . Alcohol use: Never  . Drug use: Never  . Sexual activity: Not on file    Comment: MARRIED  Other Topics Concern  . Not on file  Social History Narrative  . Not on file   Social Determinants of Health   Financial Resource Strain:   . Difficulty of Paying Living Expenses:   Food Insecurity:   . Worried About in the Last Year:   . Programme researcher, broadcasting/film/video in the Last Year:   Transportation Needs:   . Barista (Medical):   Freight forwarder Lack of Transportation (Non-Medical):   Physical Activity:   . Days of Exercise per Week:   . Minutes of Exercise per Session:   Stress:   . Feeling of Stress :   Social Connections:   . Frequency of Communication with Friends and Family:   . Frequency of Social Gatherings with Friends and Family:   . Attends Religious Services:   . Active Member of Clubs or Organizations:   . Attends Marland Kitchen Meetings:   Banker Marital Status:      Family History: The patient's family history includes Cancer - Other in his mother; Diabetes in his father and mother; Heart disease in an other family member.  ROS:   Please see the history of present illness.    Terrible back discomfort.  Wonders if he can have general anesthesia for back surgery.  No reason to exclude the possibility of general anesthesia if it is doing a timeframe where angina is under control.  All other systems  reviewed and are negative.  EKGs/Labs/Other Studies Reviewed:    The following studies were reviewed today: Cardiac cath/PCI April 27 2019 Diagnostic Dominance: Right  Intervention    EKG:  EKG no new data  Recent Labs: 03/30/2019: ALT 16 04/27/2019: BUN 14; Creatinine, Ser 0.91; Hemoglobin 15.2; Platelets 143; Potassium 4.2; Sodium 140  Recent Lipid Panel    Component Value Date/Time   CHOL 136 03/30/2019 1234   TRIG  178 (H) 03/30/2019 1234   HDL 36 (L) 03/30/2019 1234   CHOLHDL 3.8 03/30/2019 1234   LDLCALC 70 03/30/2019 1234    Physical Exam:    VS:  BP (!) 142/80   Pulse 67   Ht 5\' 7"  (1.702 m)   Wt 162 lb 6.4 oz (73.7 kg)   SpO2 99%   BMI 25.44 kg/m     Wt Readings from Last 3 Encounters:  05/11/19 162 lb 6.4 oz (73.7 kg)  04/28/19 152 lb 9.6 oz (69.2 kg)  04/24/19 161 lb 3.2 oz (73.1 kg)     GEN: Sad appearing. No acute distress HEENT: Normal NECK: No JVD. LYMPHATICS: No lymphadenopathy CARDIAC:  RRR without murmur, gallop, or edema. VASCULAR: Right femoral artery access site is unremarkable normal Pulses. No bruits. RESPIRATORY:  Clear to auscultation without rales, wheezing or rhonchi  ABDOMEN: Soft, non-tender, non-distended, No pulsatile mass, MUSCULOSKELETAL: No deformity  SKIN: Warm and dry NEUROLOGIC:  Alert and oriented x 3 PSYCHIATRIC:  Normal affect   ASSESSMENT:    1. Coronary artery disease involving native coronary artery of native heart with unstable angina pectoris ([place])   2. PAF (paroxysmal atrial fibrillation) ([place])   3. Essential hypertension   4. Hyperlipidemia LDL goal <70   5. Educated about COVID-19 virus infection    PLAN:    In order of problems listed above:  1. Minimal angina since re-dilatation and adjunctive shockwave balloon angioplasty.  28-month follow-up.  Earlier if angina. 2. No recurrence clinically. 3. Blood pressure initially elevated at 142/80 but 130/80 today. 4. LDL target less than 70.  Most recent LDL  was less than 70. 5. COVID-19 vaccine and social distancing have been complied with and still being practiced.     Medication Adjustments/Labs and Tests Ordered: Current medicines are reviewed at length with the patient today.  Concerns regarding medicines are outlined above.  No orders of the defined types were placed in this encounter.  No orders of the defined types were placed in this encounter.   Patient Instructions  Medication Instructions:  The current medical regimen is effective;  continue present plan and medications.  *If you need a refill on your cardiac medications before your next appointment, please call your pharmacy*  Follow-Up: At [facility], you and your health needs are our priority.  As part of our continuing mission to provide you with exceptional heart care, we have created designated Provider Care Teams.  These Care Teams include your primary Cardiologist (physician) and Advanced Practice Providers (APPs -  Physician Assistants and Nurse Practitioners) who all work together to provide you with the care you need, when you need it.  We recommend signing up for the patient portal called "MyChart".  Sign up information is provided on this After Visit Summary.  MyChart is used to connect with patients for Virtual Visits (Telemedicine).  Patients are able to view lab/test results, encounter notes, upcoming appointments, etc.  Non-urgent messages can be sent to your provider as well.   To learn more about what you can do with MyChart, go to [email or web address].    Your next appointment:   4 month(s)  The format for your next appointment:   In Person  Provider:   Daneen Schick, MD   Thank you for choosing [facility]!!        Signed, Sinclair Grooms, MD  05/11/2019 12:07 PM    [place]

## 2019-05-11 ENCOUNTER — Ambulatory Visit (INDEPENDENT_AMBULATORY_CARE_PROVIDER_SITE_OTHER): Payer: PRIVATE HEALTH INSURANCE | Admitting: Interventional Cardiology

## 2019-05-11 ENCOUNTER — Encounter: Payer: Self-pay | Admitting: Interventional Cardiology

## 2019-05-11 ENCOUNTER — Other Ambulatory Visit: Payer: Self-pay

## 2019-05-11 VITALS — BP 142/80 | HR 67 | Ht 67.0 in | Wt 162.4 lb

## 2019-05-11 DIAGNOSIS — I25110 Atherosclerotic heart disease of native coronary artery with unstable angina pectoris: Secondary | ICD-10-CM | POA: Diagnosis not present

## 2019-05-11 DIAGNOSIS — I10 Essential (primary) hypertension: Secondary | ICD-10-CM

## 2019-05-11 DIAGNOSIS — E785 Hyperlipidemia, unspecified: Secondary | ICD-10-CM

## 2019-05-11 DIAGNOSIS — I480 Paroxysmal atrial fibrillation: Secondary | ICD-10-CM | POA: Diagnosis not present

## 2019-05-11 DIAGNOSIS — Z7189 Other specified counseling: Secondary | ICD-10-CM

## 2019-05-11 NOTE — Patient Instructions (Signed)
Medication Instructions:  The current medical regimen is effective;  continue present plan and medications.  *If you need a refill on your cardiac medications before your next appointment, please call your pharmacy*  Follow-Up: At [facility], you and your health needs are our priority.  As part of our continuing mission to provide you with exceptional heart care, we have created designated Provider Care Teams.  These Care Teams include your primary Cardiologist (physician) and Advanced Practice Providers (APPs -  Physician Assistants and Nurse Practitioners) who all work together to provide you with the care you need, when you need it.  We recommend signing up for the patient portal called "MyChart".  Sign up information is provided on this After Visit Summary.  MyChart is used to connect with patients for Virtual Visits (Telemedicine).  Patients are able to view lab/test results, encounter notes, upcoming appointments, etc.  Non-urgent messages can be sent to your provider as well.   To learn more about what you can do with MyChart, go to [email or web address].    Your next appointment:   4 month(s)  The format for your next appointment:   In Person  Provider:   Verdis Prime, MD   Thank you for choosing [facility]!!

## 2019-08-27 ENCOUNTER — Telehealth: Payer: Self-pay | Admitting: Interventional Cardiology

## 2019-08-27 NOTE — Telephone Encounter (Signed)
Pt states for awhile now, when he takes his noon dose of Metoprolol Tartrate 100mg , he gets very fatigued and can't hold his eyes open.  HR usually runs in the 70s but over the last week has been in the low 60s.  States he just has no energy.  BP usually right around 110/65.  Denies dizziness.  Advised I will send to Dr. for review.

## 2019-08-27 NOTE — Telephone Encounter (Signed)
Pt c/o medication issue:  1. Name of Medication: metoprolol tartrate (LOPRESSOR) 100 MG tablet  2. How are you currently taking this medication (dosage and times per day)? 100 mg by mouth in the morning, at noon, and at bedtime  3. Are you having a reaction (difficulty breathing--STAT)? No   4. What is your medication issue? Patient states when he takes second dose of medication in the afternoon he becomes extremely tired and drowsy. He states he has trouble staying awake for the remainder of the day. He is requesting to reduce intake of the medication. Please advise.

## 2019-08-28 MED ORDER — METOPROLOL TARTRATE 100 MG PO TABS: ORAL_TABLET | ORAL | 3 refills | Status: DC

## 2019-08-28 NOTE — Telephone Encounter (Signed)
Spoke with pt and made him aware of recommendations.  Pt verbalized understanding and was in agreement with this plan.  

## 2019-08-28 NOTE — Telephone Encounter (Signed)
Lyn Records, MD  Julio Sicks, RN  Recommend decreasing the Noon Metoprolol dose to 50 mg and keep the PM dose unchanged.

## 2019-09-16 ENCOUNTER — Ambulatory Visit: Payer: PRIVATE HEALTH INSURANCE | Admitting: Interventional Cardiology

## 2019-09-30 NOTE — Progress Notes (Signed)
Cardiology Office Note:    Date:  10/01/2019   ID:  TIVON LEMOINE, DOB March 08, 1955, MRN [id number]  PCP:  Jiles Harold, PA-C  Cardiologist:  Lesleigh Noe, MD   Referring MD: Jiles Harold, PA-C   Chief Complaint  Patient presents with  . Coronary Artery Disease    History of Present Illness:    Terry Nelson is a 64 y.o. male with a hx of CAD,CABG in 2002 by Dr. Cherene Altes overlapping proximal to distal LAD stents with ISR, PAF and has had prior ablation, Htn, HL,and CKD.  Doing well but having angina.  Angina occurs randomly but is very controllable and easily relieved by sublingual nitroglycerin.  He has had no prolonged episodes.  Angina is more exertional than it is rest pain.  He has no requests.  He is trying much harder to control risk factors.  He has had occasional palpitations and had ablation for paroxysmal atrial fibrillation.  Past Medical History:  Diagnosis Date  . Anxiety state   . Arteriosclerosis of coronary artery   . Body mass index (bmi) 26.0-26.9, adult   . Chest pain   . CHF (congestive heart failure) (HCC)   . Chronic renal impairment   . DDD (degenerative disc disease), lumbar   . DM (diabetes mellitus) (HCC)   . Gastritis   . Headache   . Hemorrhage   . Hyperlipidemia   . Hypertension   . Kidney stones   . Lumbago   . Neck pain   . Paroxysmal A-fib (HCC)   . Pernicious anemia   . Unstable angina [facility])     Past Surgical History:  Procedure Laterality Date  . ABLATION  2017  . CARDIAC CATHETERIZATION    . CORONARY ARTERY BYPASS GRAFT    . CORONARY BALLOON ANGIOPLASTY N/A 04/27/2019   Procedure: CORONARY BALLOON ANGIOPLASTY;  Surgeon: Yvonne Kendall, MD;  Location: [facility] INVASIVE CV LAB;  Service: Cardiovascular;  Laterality: N/A;  . INTRAVASCULAR ULTRASOUND/IVUS N/A 04/27/2019   Procedure: Intravascular Ultrasound/IVUS;  Surgeon: Yvonne Kendall, MD;  Location: [facility] INVASIVE CV LAB;  Service: Cardiovascular;  Laterality:  N/A;  . LEFT HEART CATH AND CORS/GRAFTS ANGIOGRAPHY N/A 04/09/2019   Procedure: LEFT HEART CATH AND CORS/GRAFTS ANGIOGRAPHY;  Surgeon: Lyn Records, MD;  Location: [facility] CV LAB;  Service: Cardiovascular;  Laterality: N/A;    Current Medications: Current Meds  Medication Sig  . amLODipine (NORVASC) 5 MG tablet Take 5 mg by mouth daily.   Marland Kitchen aspirin EC 81 MG tablet Take 81 mg by mouth daily.  Marland Kitchen atorvastatin (LIPITOR) 80 MG tablet Take 80 mg by mouth at bedtime.   . furosemide (LASIX) 40 MG tablet Take 20 mg by mouth every other day.   . gabapentin (NEURONTIN) 300 MG capsule Take 900 mg by mouth 2 (two) times daily.   Marland Kitchen glipiZIDE (GLUCOTROL XL) 10 MG 24 hr tablet Take 10 mg by mouth 2 (two) times daily.  . isosorbide mononitrate (IMDUR) 60 MG 24 hr tablet Take 120 mg by mouth daily.  . magnesium oxide (MAG-OX) 400 MG tablet Take 800 mg by mouth in the morning, at noon, and at bedtime. TAKE 2 TABLETS BY MOUTH THREE TIMES DAILY   . metoprolol tartrate (LOPRESSOR) 100 MG tablet Take 100 mg by mouth 2 (two) times daily.  . nitroGLYCERIN (NITROSTAT) 0.4 MG SL tablet Place 0.4 mg under the tongue every 5 (five) minutes as needed for chest pain.  Marland Kitchen oxyCODONE-acetaminophen (PERCOCET) 10-325 MG tablet Take  1 tablet by mouth every 4 (four) hours as needed for pain.  . pantoprazole (PROTONIX) 40 MG tablet Take 40 mg by mouth daily.  . Polyethylene Glycol 400 (BLINK TEARS) 0.25 % SOLN Place 1 drop into both eyes 3 (three) times daily as needed (DRY/IRRITATED EYES).  . prasugrel (EFFIENT) 10 MG TABS tablet Take 10 mg by mouth daily.  . promethazine (PHENERGAN) 25 MG tablet Take 25 mg by mouth 2 (two) times daily as needed for nausea or vomiting.  Marland Kitchen spironolactone (ALDACTONE) 25 MG tablet Take 25 mg by mouth daily.  Marland Kitchen tiZANidine (ZANAFLEX) 4 MG tablet Take 4-8 mg by mouth See admin instructions. Take 1 tablet (4mg ) by mouth twice daily & Take 2 tablets (8 mg) by mouth at bedtime  . TOUJEO SOLOSTAR 300  UNIT/ML Solostar Pen Inject 26 Units into the skin daily.     Allergies:   Metoclopramide and Penicillins   Social History   Socioeconomic History  . Marital status: Married    Spouse name: Not on file  . Number of children: Not on file  . Years of education: Not on file  . Highest education level: Not on file  Occupational History  . Not on file  Tobacco Use  . Smoking status: Never Smoker  . Smokeless tobacco: Never Used  Vaping Use  . Vaping Use: Never used  Substance and Sexual Activity  . Alcohol use: Never  . Drug use: Never  . Sexual activity: Not on file    Comment: MARRIED  Other Topics Concern  . Not on file  Social History Narrative  . Not on file   Social Determinants of Health   Financial Resource Strain:   . Difficulty of Paying Living Expenses: Not on file  Food Insecurity:   . Worried About in the Last Year: Not on file  . Ran Out of Food in the Last Year: Not on file  Transportation Needs:   . Lack of Transportation (Medical): Not on file  . Lack of Transportation (Non-Medical): Not on file  Physical Activity:   . Days of Exercise per Week: Not on file  . Minutes of Exercise per Session: Not on file  Stress:   . Feeling of Stress : Not on file  Social Connections:   . Frequency of Communication with Friends and Family: Not on file  . Frequency of Social Gatherings with Friends and Family: Not on file  . Attends Religious Services: Not on file  . Active Member of Clubs or Organizations: Not on file  . Attends Programme researcher, broadcasting/film/video Meetings: Not on file  . Marital Status: Not on file     Family History: The patient's family history includes Cancer - Other in his mother; Diabetes in his father and mother; Heart disease in an other family member.  ROS:   Please see the history of present illness.    Difficulty with ambulation is free of discomfort rest and physical.  Right leg pain is more controlled other symptoms.  All other  systems reviewed and are negative.  EKGs/Labs/Other Studies Reviewed:    The following studies were reviewed today: No new data all imaging or functional state  EKG:  EKG a new tracing is not repeated.  The EKG performed in April 2021 reveals nonspecific ST-T wave abnormality anterolateral.  Recent Labs: 03/30/2019: ALT 16 04/27/2019: BUN 14; Creatinine, Ser 0.91; Hemoglobin 15.2; Platelets 143; Potassium 4.2; Sodium 140  Recent Lipid Panel    Component  Value Date/Time   CHOL 136 03/30/2019 1234   TRIG 178 (H) 03/30/2019 1234   HDL 36 (L) 03/30/2019 1234   CHOLHDL 3.8 03/30/2019 1234   LDLCALC 70 03/30/2019 1234    Physical Exam:    VS:  BP 136/82   Pulse 67   Ht 5\' 7"  (1.702 m)   Wt 158 lb 12.8 oz (72 kg)   SpO2 98%   BMI 24.87 kg/m     Wt Readings from Last 3 Encounters:  10/01/19 158 lb 12.8 oz (72 kg)  05/11/19 162 lb 6.4 oz (73.7 kg)  04/28/19 152 lb 9.6 oz (69.2 kg)     GEN: Appears older than stated age. No acute distress HEENT: Normal NECK: No JVD. LYMPHATICS: No lymphadenopathy CARDIAC:  RRR without murmur, gallop, or edema. VASCULAR:  Normal Pulses. No bruits. RESPIRATORY:  Clear to auscultation without rales, wheezing or rhonchi  ABDOMEN: Soft, non-tender, non-distended, No pulsatile mass, MUSCULOSKELETAL: No deformity  SKIN: Warm and dry NEUROLOGIC:  Alert and oriented x 3 PSYCHIATRIC:  Normal affect   ASSESSMENT:    1. Coronary artery disease involving native coronary artery of native heart with unstable angina pectoris (HCC)   2. PAF (paroxysmal atrial fibrillation) (HCC)   3. Essential hypertension   4. Hyperlipidemia LDL goal <70   5. Educated about COVID-19 virus infection    PLAN:    In order of problems listed above:  1. Secondary prevention discussed.  Nitroglycerin use discussed.  Increase likelihood of angina in the winter discussed.  Encouraged aerobic activity.  Continue Imdur and sublingual nitroglycerin as needed for chest  pain. 2. Prior ablation for PAF.  Will monitor for worsening palpitations.  He has an old loop recorder that is no longer functional.  He has a [employer] greater than 2. 3. Good blood pressure control.  Continue Norvasc 5 mg/day, Lasix 40 mg/day, Imdur 60 mg/day, Lopressor 100 mg twice daily and Aldactone 25 mg/day. 4. LDL was 70 in March on current dose of Lipitor 80 mg/day. 5. He is vaccinated and practicing mitigation.  Overall education and awareness concerning primary/secondary risk prevention was discussed in detail: LDL less than 70, hemoglobin A1c less than 7, blood pressure target less than 130/80 mmHg, >150 minutes of moderate aerobic activity per week, avoidance of smoking, weight control (via diet and exercise), and continued surveillance/management of/for obstructive sleep apnea.    Medication Adjustments/Labs and Tests Ordered: Current medicines are reviewed at length with the patient today.  Concerns regarding medicines are outlined above.  No orders of the defined types were placed in this encounter.  No orders of the defined types were placed in this encounter.   There are no Patient Instructions on file for this visit.   Signed, April, MD  10/01/2019 10:45 AM    [facility]

## 2019-10-01 ENCOUNTER — Ambulatory Visit (INDEPENDENT_AMBULATORY_CARE_PROVIDER_SITE_OTHER): Payer: PRIVATE HEALTH INSURANCE | Admitting: Interventional Cardiology

## 2019-10-01 ENCOUNTER — Encounter: Payer: Self-pay | Admitting: Interventional Cardiology

## 2019-10-01 ENCOUNTER — Other Ambulatory Visit: Payer: Self-pay

## 2019-10-01 VITALS — BP 136/82 | HR 67 | Ht 67.0 in | Wt 158.8 lb

## 2019-10-01 DIAGNOSIS — I480 Paroxysmal atrial fibrillation: Secondary | ICD-10-CM

## 2019-10-01 DIAGNOSIS — E785 Hyperlipidemia, unspecified: Secondary | ICD-10-CM | POA: Diagnosis not present

## 2019-10-01 DIAGNOSIS — I25110 Atherosclerotic heart disease of native coronary artery with unstable angina pectoris: Secondary | ICD-10-CM

## 2019-10-01 DIAGNOSIS — I10 Essential (primary) hypertension: Secondary | ICD-10-CM

## 2019-10-01 DIAGNOSIS — Z7189 Other specified counseling: Secondary | ICD-10-CM

## 2019-10-01 NOTE — Patient Instructions (Signed)
Medication Instructions:  Your physician recommends that you continue on your current medications as directed. Please refer to the Current Medication list given to you today.  *If you need a refill on your cardiac medications before your next appointment, please call your pharmacy*   Lab Work: None If you have labs (blood work) drawn today and your tests are completely normal, you will receive your results only by: Marland Kitchen MyChart Message (if you have MyChart) OR . A paper copy in the mail If you have any lab test that is abnormal or we need to change your treatment, we will call you to review the results.   Testing/Procedures: None   Follow-Up: At [facility], you and your health needs are our priority.  As part of our continuing mission to provide you with exceptional heart care, we have created designated Provider Care Teams.  These Care Teams include your primary Cardiologist (physician) and Advanced Practice Providers (APPs -  Physician Assistants and Nurse Practitioners) who all work together to provide you with the care you need, when you need it.  We recommend signing up for the patient portal called "MyChart".  Sign up information is provided on this After Visit Summary.  MyChart is used to connect with patients for Virtual Visits (Telemedicine).  Patients are able to view lab/test results, encounter notes, upcoming appointments, etc.  Non-urgent messages can be sent to your provider as well.   To learn more about what you can do with MyChart, go to [email or web address].    Your next appointment:   6 month(s)  The format for your next appointment:   In Person  Provider:   You will see one of the following Advanced Practice Providers on your designated Care Team:    Norma Fredrickson, NP  Nada Boozer, NP  Georgie Chard, NP  Then, Lesleigh Noe, MD will plan to see you again in 12 month(s).   Other Instructions

## 2019-11-11 ENCOUNTER — Ambulatory Visit: Payer: PRIVATE HEALTH INSURANCE | Admitting: Interventional Cardiology

## 2019-12-13 ENCOUNTER — Telehealth: Payer: Self-pay | Admitting: Physician Assistant

## 2019-12-13 NOTE — Telephone Encounter (Signed)
   Mr Ridling has been having more angina than usual recently.   His chest pain is improved by nitro, but he has trouble getting it resolved.  The chest pain episodes have increased in frequency and severity recently.  He is concerned that he is getting ready to have a heart attack.   However, currently his symptoms are pretty well under control.   As the chest pain is not bad right now, he does not wish to come to the hospital.  I advised him that to come to the hospital is my recommendation.  I requested he call EMS.  If his symptoms are severe, I recommend that he come to the closest hospital, which is [place].  If his symptoms are not severe and EMS can bring him directly to [facility] hospital, that is okay.  He is reluctant to come to the hospital right now, but says he will come in if symptoms worsen.  I advised him that he can take an extra 60 mg of isosorbide daily, increasing his dose to 120 mg a.m. and 60 mg p.m.  I will route this to the schedulers and to Dr. Katrinka Blazing, and request an ASAP appointment for him, TOC and 72 hours slots okay.  [name], PA-C 12/13/2019 2:21 PM

## 2019-12-14 ENCOUNTER — Ambulatory Visit (INDEPENDENT_AMBULATORY_CARE_PROVIDER_SITE_OTHER): Payer: BC Managed Care – PPO | Admitting: Interventional Cardiology

## 2019-12-14 ENCOUNTER — Other Ambulatory Visit (HOSPITAL_COMMUNITY)
Admission: RE | Admit: 2019-12-14 | Discharge: 2019-12-14 | Disposition: A | Discharge status: Home / Self Care | Payer: BC Managed Care – PPO | Source: Ambulatory Visit | Attending: Interventional Cardiology | Admitting: Interventional Cardiology

## 2019-12-14 ENCOUNTER — Other Ambulatory Visit: Payer: Self-pay

## 2019-12-14 ENCOUNTER — Encounter: Payer: Self-pay | Admitting: Interventional Cardiology

## 2019-12-14 VITALS — BP 122/78 | HR 56 | Ht 67.0 in | Wt 160.8 lb

## 2019-12-14 DIAGNOSIS — E785 Hyperlipidemia, unspecified: Secondary | ICD-10-CM

## 2019-12-14 DIAGNOSIS — Z20822 Contact with and (suspected) exposure to covid-19: Secondary | ICD-10-CM | POA: Diagnosis not present

## 2019-12-14 DIAGNOSIS — I10 Essential (primary) hypertension: Secondary | ICD-10-CM | POA: Diagnosis not present

## 2019-12-14 DIAGNOSIS — R079 Chest pain, unspecified: Secondary | ICD-10-CM

## 2019-12-14 DIAGNOSIS — Z01812 Encounter for preprocedural laboratory examination: Secondary | ICD-10-CM | POA: Diagnosis not present

## 2019-12-14 DIAGNOSIS — I25110 Atherosclerotic heart disease of native coronary artery with unstable angina pectoris: Secondary | ICD-10-CM | POA: Diagnosis not present

## 2019-12-14 DIAGNOSIS — I480 Paroxysmal atrial fibrillation: Secondary | ICD-10-CM

## 2019-12-14 DIAGNOSIS — Z7189 Other specified counseling: Secondary | ICD-10-CM

## 2019-12-14 LAB — SARS CORONAVIRUS 2 (TAT 6-24 HRS): SARS Coronavirus 2: NEGATIVE

## 2019-12-14 MED ORDER — AMLODIPINE BESYLATE 10 MG PO TABS: 10.0000 mg | ORAL_TABLET | Freq: Every day | ORAL | 3 refills | Status: DC

## 2019-12-14 NOTE — Telephone Encounter (Signed)
Called pt and scheduled him to come in and see Dr. Katrinka Blazing today at 10:20am.

## 2019-12-14 NOTE — Progress Notes (Signed)
Cardiology Office Note:    Date:  12/14/2019   ID:  Terry BISCHOF, DOB [birth year], MRN [id number]  PCP:  Mila Palmer, PA-C  Cardiologist:  Sinclair Grooms, MD   Referring MD: Mila Palmer, PA-C   Chief Complaint  Patient presents with  . Coronary Artery Disease  . Chest Pain    History of Present Illness:    Terry Nelson is a 64 y.o. male with a hx of CAD,CABG in 2002 by Dr. Candy Sledge overlapping proximal to distal LAD stents with ISR,PAF and has had prior ablation, Htn, HL,and CKD.  This office visit is based upon a telephone encounter within the past 24 hours with Rosaria Ferries, PA-C:  "Mr Thibault has been having more angina than usual recently.   His chest pain is improved by nitro, but he has trouble getting it resolved.  The chest pain episodes have increased in frequency and severity recently.  He is concerned that he is getting ready to have a heart attack.   However, currently his symptoms are pretty well under control.   As the chest pain is not bad right now, he does not wish to come to the hospital.  I advised him that to come to the hospital is my recommendation.  I requested he call EMS.  If his symptoms are severe, I recommend that he come to the closest hospital, which is [place].  If his symptoms are not severe and EMS can bring him directly to [facility] hospital, that is okay.  He is reluctant to come to the hospital right now, but says he will come in if symptoms worsen.  I advised him that he can take an extra 60 mg of isosorbide daily, increasing his dose to 120 mg a.m. and 60 mg p.m."  Had recent intervention within the LAD which is a full metal jacket.  Required balloon assisted lithotripsy for in-stent restenosis.  There was still residual obstruction.  He is having increasingly frequent episodes of chest discomfort at rest and also with exertion.  Sublingual nitroglycerin as helping but there has been a clear acceleration in  angina intensity and occurrence.  There is not significant room to increase anti-ischemic therapy.  He is on max dose Imdur, on 200 mg of metoprolol tartrate daily, and amlodipine 5 mg/day.  Ranexa in the past has caused side effects-nausea, dizziness.  Past Medical History:  Diagnosis Date  . Anxiety state   . Arteriosclerosis of coronary artery   . Body mass index (bmi) 26.0-26.9, adult   . Chest pain   . CHF (congestive heart failure) ([place])   . Chronic renal impairment   . DDD (degenerative disc disease), lumbar   . DM (diabetes mellitus) ([place])   . Gastritis   . Headache   . Hemorrhage   . Hyperlipidemia   . Hypertension   . Kidney stones   . Lumbago   . Neck pain   . Paroxysmal A-fib ([place])   . Pernicious anemia   . Unstable angina [facility])     Past Surgical History:  Procedure Laterality Date  . ABLATION  2017  . CARDIAC CATHETERIZATION    . CORONARY ARTERY BYPASS GRAFT    . CORONARY BALLOON ANGIOPLASTY N/A 04/27/2019   Procedure: CORONARY BALLOON ANGIOPLASTY;  Surgeon: Nelva Bush, MD;  Location: [facility];  Service: Cardiovascular;  Laterality: N/A;  . INTRAVASCULAR ULTRASOUND/IVUS N/A 04/27/2019   Procedure: Intravascular Ultrasound/IVUS;  Surgeon: Nelva Bush, MD;  Location: [facility];  Service: Cardiovascular;  Laterality: N/A;  . LEFT HEART CATH AND CORS/GRAFTS ANGIOGRAPHY N/A 04/09/2019   Procedure: LEFT HEART CATH AND CORS/GRAFTS ANGIOGRAPHY;  Surgeon: Belva Crome, MD;  Location: [facility];  Service: Cardiovascular;  Laterality: N/A;    Current Medications: Current Meds  Medication Sig  . aspirin EC 81 MG tablet Take 81 mg by mouth daily.  Marland Kitchen atorvastatin (LIPITOR) 80 MG tablet Take 80 mg by mouth at bedtime.   . Blood Glucose Monitoring Suppl (CONTOUR NEXT MONITOR) w/Device KIT daily.  . furosemide (LASIX) 40 MG tablet Take 20 mg by mouth daily.   Marland Kitchen gabapentin (NEURONTIN) 300 MG capsule Take 1,200 mg by mouth 2 (two) times  daily.   Marland Kitchen glipiZIDE (GLUCOTROL XL) 10 MG 24 hr tablet Take 10 mg by mouth 2 (two) times daily.  Marland Kitchen glucose blood (CONTOUR NEXT TEST) test strip by Does not apply route.  . Insulin Pen Needle (BD PEN NEEDLE NANO U/F) 32G X 4 MM MISC USE ONCE AS DIRECTED  . isosorbide mononitrate (IMDUR) 60 MG 24 hr tablet Take 120 mg by mouth daily.  . magnesium oxide (MAG-OX) 400 MG tablet Take 800 mg by mouth in the morning, at noon, and at bedtime. TAKE 2 TABLETS BY MOUTH THREE TIMES DAILY   . metoprolol tartrate (LOPRESSOR) 100 MG tablet Take 100 mg by mouth 2 (two) times daily.  . nitroGLYCERIN (NITROSTAT) 0.4 MG SL tablet Place 0.4 mg under the tongue every 5 (five) minutes as needed for chest pain.  Marland Kitchen oxyCODONE-acetaminophen (PERCOCET) 10-325 MG tablet Take 1 tablet by mouth every 4 (four) hours as needed for pain.  . pantoprazole (PROTONIX) 40 MG tablet Take 40 mg by mouth daily.  . Polyethylene Glycol 400 (BLINK TEARS) 0.25 % SOLN Place 1 drop into both eyes 3 (three) times daily as needed (DRY/IRRITATED EYES).  . prasugrel (EFFIENT) 10 MG TABS tablet Take 10 mg by mouth daily.  . promethazine (PHENERGAN) 25 MG tablet Take 25 mg by mouth 2 (two) times daily as needed for nausea or vomiting.  Marland Kitchen spironolactone (ALDACTONE) 25 MG tablet Take 25 mg by mouth daily.  Marland Kitchen tiZANidine (ZANAFLEX) 4 MG tablet Take 4-8 mg by mouth See admin instructions. Take 1 tablet ($RemoveB'4mg'HNdVokOY$ ) by mouth twice daily & Take 2 tablets (8 mg) by mouth at bedtime  . TOUJEO SOLOSTAR 300 UNIT/ML Solostar Pen Inject 28 Units into the skin daily.   . [DISCONTINUED] amLODipine (NORVASC) 5 MG tablet Take 5 mg by mouth daily.      Allergies:   Metoclopramide and Penicillins   Social History   Socioeconomic History  . Marital status: Married    Spouse name: Not on file  . Number of children: Not on file  . Years of education: Not on file  . Highest education level: Not on file  Occupational History  . Not on file  Tobacco Use  . Smoking  status: Never Smoker  . Smokeless tobacco: Never Used  Vaping Use  . Vaping Use: Never used  Substance and Sexual Activity  . Alcohol use: Never  . Drug use: Never  . Sexual activity: Not on file    Comment: MARRIED  Other Topics Concern  . Not on file  Social History Narrative  . Not on file   Social Determinants of Health   Financial Resource Strain:   . Difficulty of Paying Living Expenses: Not on file  Food Insecurity:   . Worried About Charity fundraiser in  the Last Year: Not on file  . Ran Out of Food in the Last Year: Not on file  Transportation Needs:   . Lack of Transportation (Medical): Not on file  . Lack of Transportation (Non-Medical): Not on file  Physical Activity:   . Days of Exercise per Week: Not on file  . Minutes of Exercise per Session: Not on file  Stress:   . Feeling of Stress : Not on file  Social Connections:   . Frequency of Communication with Friends and Family: Not on file  . Frequency of Social Gatherings with Friends and Family: Not on file  . Attends Religious Services: Not on file  . Active Member of Clubs or Organizations: Not on file  . Attends Archivist Meetings: Not on file  . Marital Status: Not on file     Family History: The patient's family history includes Cancer - Other in his mother; Diabetes in his father and mother; Heart disease in an other family member.  ROS:   Please see the history of present illness.    Depressed.  Has again lost his quality of life because angina is so frequent.  All other systems reviewed and are negative.  EKGs/Labs/Other Studies Reviewed:    The following studies were reviewed today: Diagnostic catheterization April 2021: Diagnostic Dominance: Right  Intervention on LAD April 27, 2019: Diagnostic Dominance: Right  Intervention      EKG:  EKG sinus bradycardia with nonspecific ST-T abnormality lateral precordial and lateral limb leads.  When compared to prior tracing from  April 2021  Recent Labs: 03/30/2019: ALT 16 04/27/2019: BUN 14; Creatinine, Ser 0.91; Hemoglobin 15.2; Platelets 143; Potassium 4.2; Sodium 140  Recent Lipid Panel    Component Value Date/Time   CHOL 136 03/30/2019 1234   TRIG 178 (H) 03/30/2019 1234   HDL 36 (L) 03/30/2019 1234   CHOLHDL 3.8 03/30/2019 1234   LDLCALC 70 03/30/2019 1234    Physical Exam:    VS:  BP 122/78   Pulse (!) 56   Ht $R'5\' 7"'Po$  (1.702 m)   Wt 160 lb 12.8 oz (72.9 kg)   SpO2 97%   BMI 25.18 kg/m     Wt Readings from Last 3 Encounters:  12/14/19 160 lb 12.8 oz (72.9 kg)  10/01/19 158 lb 12.8 oz (72 kg)  05/11/19 162 lb 6.4 oz (73.7 kg)     GEN: Appears depressed.  Wearing a mask.. No acute distress HEENT: Normal NECK: No JVD. LYMPHATICS: No lymphadenopathy CARDIAC:  RRR without murmur, gallop, or edema. VASCULAR: Normal Pulses. No bruits. RESPIRATORY:  Clear to auscultation without rales, wheezing or rhonchi  ABDOMEN: Soft, non-tender, non-distended, No pulsatile mass, MUSCULOSKELETAL: No deformity  SKIN: Warm and dry NEUROLOGIC:  Alert and oriented x 3 PSYCHIATRIC:  Normal affect   ASSESSMENT:    1. Coronary artery disease involving native coronary artery of native heart with unstable angina pectoris ([place])   2. PAF (paroxysmal atrial fibrillation) ([place])   3. Essential hypertension   4. Hyperlipidemia LDL goal <70   5. Educated about COVID-19 virus infection   6. Chest pain of uncertain etiology    PLAN:    In order of problems listed above:  1. Pression no angina, likely related to recurrent restenosis in the LAD territory.  Symptoms are very similar to April when he underwent redilatation of a metal jacket and LAD territory guided by IVUS.  Under the circumstances and with near maximal medical therapy, we will restudy the  patient and perform PCI if indicated.  In the interval until the procedure in 48 hours, increase amlodipine to 10 mg/day. 2. Currently in sinus rhythm/sinus  bradycardia. 3. Continue high intensity statin therapy. 4. Continue atorvastatin 80 mg/day. 5. COVID-19 vaccinated and practicing mitigation. 6. Increase amlodipine to 10 mg/day.  The patient was counseled to undergo left heart catheterization, coronary angiography, and possible percutaneous coronary intervention with stent implantation. The procedural risks and benefits were discussed in detail. The risks discussed included death, stroke, myocardial infarction, life-threatening bleeding, limb ischemia, kidney injury, allergy, and possible emergency cardiac surgery. The risk of these significant complications were estimated to occur less than 1% of the time. After discussion, the patient has agreed to proceed.     Medication Adjustments/Labs and Tests Ordered: Current medicines are reviewed at length with the patient today.  Concerns regarding medicines are outlined above.  Orders Placed This Encounter  Procedures  . Basic metabolic panel  . CBC  . EKG 12-Lead   Meds ordered this encounter  Medications  . amLODipine (NORVASC) 10 MG tablet    Sig: Take 1 tablet (10 mg total) by mouth daily.    Dispense:  90 tablet    Refill:  3    Dose change    Patient Instructions  Medication Instructions:  1) INCREASE Amlodipine to $RemoveBefor'10mg'yiNNisFcssUq$  once daily  *If you need a refill on your cardiac medications before your next appointment, please call your pharmacy*   Lab Work: BMET and CBC today  If you have labs (blood work) drawn today and your tests are completely normal, you will receive your results only by: Marland Kitchen MyChart Message (if you have MyChart) OR . A paper copy in the mail If you have any lab test that is abnormal or we need to change your treatment, we will call you to review the results.   Testing/Procedures: Your physician has requested that you have a cardiac catheterization. Cardiac catheterization is used to diagnose and/or treat various heart conditions. Doctors may recommend this  procedure for a number of different reasons. The most common reason is to evaluate chest pain. Chest pain can be a symptom of coronary artery disease (CAD), and cardiac catheterization can show whether plaque is narrowing or blocking your heart's arteries. This procedure is also used to evaluate the valves, as well as measure the blood flow and oxygen levels in different parts of your heart. For further information please visit [email or web address]. Please follow instruction sheet, as given.   Follow-Up: At [facility], you and your health needs are our priority.  As part of our continuing mission to provide you with exceptional heart care, we have created designated Provider Care Teams.  These Care Teams include your primary Cardiologist (physician) and Advanced Practice Providers (APPs -  Physician Assistants and Nurse Practitioners) who all work together to provide you with the care you need, when you need it.  We recommend signing up for the patient portal called "MyChart".  Sign up information is provided on this After Visit Summary.  MyChart is used to connect with patients for Virtual Visits (Telemedicine).  Patients are able to view lab/test results, encounter notes, upcoming appointments, etc.  Non-urgent messages can be sent to your provider as well.   To learn more about what you can do with MyChart, go to [email or web address].    Your next appointment:   2-3 week(s) after cath  The format for your next appointment:   In Person  Provider:   You may see  Sinclair Grooms, MD or one of the following Advanced Practice Providers on your designated Care Team:    Truitt Merle, NP  Cecilie Kicks, NP  Kathyrn Drown, NP    Other Instructions  Due to recent COVID-19 restrictions implemented by our local and state authorities and in an effort to keep both patients and staff as safe as possible, our hospital system requires COVID-19 testing prior to certain scheduled hospital  procedures.  Please go to [place]. [place], [place] [address] on 12/14/19 at 12:50PM  .  This is a drive up testing site.  You will not need to exit your vehicle.  You must agree to self-quarantine from the time of your testing until the procedure date on 12/16/19.  This should included staying home with ONLY the people you live with.  Avoid take-out, grocery store shopping or leaving the house for any non-emergent reason.  Failure to have your COVID-19 test done on the date and time you have been scheduled will result in cancellation of your procedure.  Please call our office at [phone] if you have any questions.    [place] OFFICE [place], [place] [address] Dept: [phone] Loc: [place]  12/14/2019  You are scheduled for a Cardiac Catheterization on Wednesday, December 8 with Dr. Daneen Schick.  1. Please arrive at the [facility] (Main Entrance A) at [facility]: [address] [place], [place] [address] at 11:00 AM (This time is two hours before your procedure to ensure your preparation). Free valet parking service is available.   Special note: Every effort is made to have your procedure done on time. Please understand that emergencies sometimes delay scheduled procedures.  2. Diet: Do not eat solid foods after midnight.  The patient may have clear liquids until 5am upon the day of the procedure.  3. Labs: You will have labs drawn today.  4. Medication instructions in preparation for your procedure:   Contrast Allergy: No   Do not take your Furosemide, Glipizide, Toujeo, or Spironolactone the morning of the procedure.  On the morning of your procedure, take your Aspirin and any morning medicines NOT listed above.  You may use sips of water.  5. Plan for one night stay--bring personal belongings. 6. Bring a current list of your medications and  current insurance cards. 7. You MUST have a responsible person to drive you home. 8. Someone MUST be with you the first 24 hours after you arrive home or your discharge will be delayed. 9. Please wear clothes that are easy to get on and off and wear slip-on shoes.  Thank you for allowing [place] to care for you!   -- [facility]     Signed, Sinclair Grooms, MD  12/14/2019 11:13 AM    [place]

## 2019-12-14 NOTE — H&P (View-Only) (Signed)
Cardiology Office Note:    Date:  12/14/2019   ID:  Terry Nelson, DOB 17-May-1955, MRN [id number]  PCP:  Terry Harold, PA-C  Cardiologist:  Terry Noe, MD   Referring MD: Terry Harold, PA-C   Chief Complaint  Patient presents with  . Coronary Artery Disease  . Chest Pain    History of Present Illness:    Terry Nelson is a 64 y.o. male with a hx of CAD,CABG in 2002 by Dr. Cherene Nelson overlapping proximal to distal LAD stents with ISR,PAF and has had prior ablation, Htn, HL,and CKD.  This office visit is based upon a telephone encounter within the past 24 hours with Terry Demark, PA-C:  "Terry Nelson has been having more angina than usual recently.   His chest pain is improved by nitro, but he has trouble getting it resolved.  The chest pain episodes have increased in frequency and severity recently.  He is concerned that he is getting ready to have a heart attack.   However, currently his symptoms are pretty well under control.   As the chest pain is not bad right now, he does not wish to come to the hospital.  I advised him that to come to the hospital is my recommendation.  I requested he call EMS.  If his symptoms are severe, I recommend that he come to the closest hospital, which is [facility].  If his symptoms are not severe and EMS can bring him directly to [facility] hospital, that is okay.  He is reluctant to come to the hospital right now, but says he will come in if symptoms worsen.  I advised him that he can take an extra 60 mg of isosorbide daily, increasing his dose to 120 mg a.m. and 60 mg p.m."  Had recent intervention within the LAD which is a full metal jacket.  Required balloon assisted lithotripsy for in-stent restenosis.  There was still residual obstruction.  He is having increasingly frequent episodes of chest discomfort at rest and also with exertion.  Sublingual nitroglycerin as helping but there has been a clear acceleration in  angina intensity and occurrence.  There is not significant room to increase anti-ischemic therapy.  He is on max dose Imdur, on 200 mg of metoprolol tartrate daily, and amlodipine 5 mg/day.  Ranexa in the past has caused side effects-nausea, dizziness.  Past Medical History:  Diagnosis Date  . Anxiety state   . Arteriosclerosis of coronary artery   . Body mass index (bmi) 26.0-26.9, adult   . Chest pain   . CHF (congestive heart failure) (HCC)   . Chronic renal impairment   . DDD (degenerative disc disease), lumbar   . DM (diabetes mellitus) (HCC)   . Gastritis   . Headache   . Hemorrhage   . Hyperlipidemia   . Hypertension   . Kidney stones   . Lumbago   . Neck pain   . Paroxysmal A-fib (HCC)   . Pernicious anemia   . Unstable angina [facility])     Past Surgical History:  Procedure Laterality Date  . ABLATION  2017  . CARDIAC CATHETERIZATION    . CORONARY ARTERY BYPASS GRAFT    . CORONARY BALLOON ANGIOPLASTY N/A 04/27/2019   Procedure: CORONARY BALLOON ANGIOPLASTY;  Surgeon: Terry Kendall, MD;  Location: [facility] INVASIVE CV LAB;  Service: Cardiovascular;  Laterality: N/A;  . INTRAVASCULAR ULTRASOUND/IVUS N/A 04/27/2019   Procedure: Intravascular Ultrasound/IVUS;  Surgeon: Terry Kendall, MD;  Location: [facility] INVASIVE CV LAB;  Service: Cardiovascular;  Laterality: N/A;  . LEFT HEART CATH AND CORS/GRAFTS ANGIOGRAPHY N/A 04/09/2019   Procedure: LEFT HEART CATH AND CORS/GRAFTS ANGIOGRAPHY;  Surgeon: Terry Crome, MD;  Location: [place] CV LAB;  Service: Cardiovascular;  Laterality: N/A;    Current Medications: Current Meds  Medication Sig  . aspirin EC 81 MG tablet Take 81 mg by mouth daily.  Marland Kitchen atorvastatin (LIPITOR) 80 MG tablet Take 80 mg by mouth at bedtime.   . Blood Glucose Monitoring Suppl (CONTOUR NEXT MONITOR) w/Device KIT daily.  . furosemide (LASIX) 40 MG tablet Take 20 mg by mouth daily.   Marland Kitchen gabapentin (NEURONTIN) 300 MG capsule Take 1,200 mg by mouth 2 (two) times  daily.   Marland Kitchen glipiZIDE (GLUCOTROL XL) 10 MG 24 hr tablet Take 10 mg by mouth 2 (two) times daily.  Marland Kitchen glucose blood (CONTOUR NEXT TEST) test strip by Does not apply route.  . Insulin Pen Needle (BD PEN NEEDLE NANO U/F) 32G X 4 MM MISC USE ONCE AS DIRECTED  . isosorbide mononitrate (IMDUR) 60 MG 24 hr tablet Take 120 mg by mouth daily.  . magnesium oxide (MAG-OX) 400 MG tablet Take 800 mg by mouth in the morning, at noon, and at bedtime. TAKE 2 TABLETS BY MOUTH THREE TIMES DAILY   . metoprolol tartrate (LOPRESSOR) 100 MG tablet Take 100 mg by mouth 2 (two) times daily.  . nitroGLYCERIN (NITROSTAT) 0.4 MG SL tablet Place 0.4 mg under the tongue every 5 (five) minutes as needed for chest pain.  Marland Kitchen oxyCODONE-acetaminophen (PERCOCET) 10-325 MG tablet Take 1 tablet by mouth every 4 (four) hours as needed for pain.  . pantoprazole (PROTONIX) 40 MG tablet Take 40 mg by mouth daily.  . Polyethylene Glycol 400 (BLINK TEARS) 0.25 % SOLN Place 1 drop into both eyes 3 (three) times daily as needed (DRY/IRRITATED EYES).  . prasugrel (EFFIENT) 10 MG TABS tablet Take 10 mg by mouth daily.  . promethazine (PHENERGAN) 25 MG tablet Take 25 mg by mouth 2 (two) times daily as needed for nausea or vomiting.  Marland Kitchen spironolactone (ALDACTONE) 25 MG tablet Take 25 mg by mouth daily.  Marland Kitchen tiZANidine (ZANAFLEX) 4 MG tablet Take 4-8 mg by mouth See admin instructions. Take 1 tablet ($RemoveB'4mg'GOjJwXQi$ ) by mouth twice daily & Take 2 tablets (8 mg) by mouth at bedtime  . TOUJEO SOLOSTAR 300 UNIT/ML Solostar Pen Inject 28 Units into the skin daily.   . [DISCONTINUED] amLODipine (NORVASC) 5 MG tablet Take 5 mg by mouth daily.      Allergies:   Metoclopramide and Penicillins   Social History   Socioeconomic History  . Marital status: Married    Spouse name: Not on file  . Number of children: Not on file  . Years of education: Not on file  . Highest education level: Not on file  Occupational History  . Not on file  Tobacco Use  . Smoking  status: Never Smoker  . Smokeless tobacco: Never Used  Vaping Use  . Vaping Use: Never used  Substance and Sexual Activity  . Alcohol use: Never  . Drug use: Never  . Sexual activity: Not on file    Comment: MARRIED  Other Topics Concern  . Not on file  Social History Narrative  . Not on file   Social Determinants of Health   Financial Resource Strain:   . Difficulty of Paying Living Expenses: Not on file  Food Insecurity:   . Worried About Charity fundraiser in  the Last Year: Not on file  . Ran Out of Food in the Last Year: Not on file  Transportation Needs:   . Lack of Transportation (Medical): Not on file  . Lack of Transportation (Non-Medical): Not on file  Physical Activity:   . Days of Exercise per Week: Not on file  . Minutes of Exercise per Session: Not on file  Stress:   . Feeling of Stress : Not on file  Social Connections:   . Frequency of Communication with Friends and Family: Not on file  . Frequency of Social Gatherings with Friends and Family: Not on file  . Attends Religious Services: Not on file  . Active Member of Clubs or Organizations: Not on file  . Attends Archivist Meetings: Not on file  . Marital Status: Not on file     Family History: The patient's family history includes Cancer - Other in his mother; Diabetes in his father and mother; Heart disease in an other family member.  ROS:   Please see the history of present illness.    Depressed.  Has again lost his quality of life because angina is so frequent.  All other systems reviewed and are negative.  EKGs/Labs/Other Studies Reviewed:    The following studies were reviewed today: Diagnostic catheterization April 2021: Diagnostic Dominance: Right  Intervention on LAD April 27, 2019: Diagnostic Dominance: Right  Intervention      EKG:  EKG sinus bradycardia with nonspecific ST-T abnormality lateral precordial and lateral limb leads.  When compared to prior tracing from  April 2021  Recent Labs: 03/30/2019: ALT 16 04/27/2019: BUN 14; Creatinine, Ser 0.91; Hemoglobin 15.2; Platelets 143; Potassium 4.2; Sodium 140  Recent Lipid Panel    Component Value Date/Time   CHOL 136 03/30/2019 1234   TRIG 178 (H) 03/30/2019 1234   HDL 36 (L) 03/30/2019 1234   CHOLHDL 3.8 03/30/2019 1234   LDLCALC 70 03/30/2019 1234    Physical Exam:    VS:  BP 122/78   Pulse (!) 56   Ht $R'5\' 7"'rG$  (1.702 m)   Wt 160 lb 12.8 oz (72.9 kg)   SpO2 97%   BMI 25.18 kg/m     Wt Readings from Last 3 Encounters:  12/14/19 160 lb 12.8 oz (72.9 kg)  10/01/19 158 lb 12.8 oz (72 kg)  05/11/19 162 lb 6.4 oz (73.7 kg)     GEN: Appears depressed.  Wearing a mask.. No acute distress HEENT: Normal NECK: No JVD. LYMPHATICS: No lymphadenopathy CARDIAC:  RRR without murmur, gallop, or edema. VASCULAR: Normal Pulses. No bruits. RESPIRATORY:  Clear to auscultation without rales, wheezing or rhonchi  ABDOMEN: Soft, non-tender, non-distended, No pulsatile mass, MUSCULOSKELETAL: No deformity  SKIN: Warm and dry NEUROLOGIC:  Alert and oriented x 3 PSYCHIATRIC:  Normal affect   ASSESSMENT:    1. Coronary artery disease involving native coronary artery of native heart with unstable angina pectoris ([place])   2. PAF (paroxysmal atrial fibrillation) ([place])   3. Essential hypertension   4. Hyperlipidemia LDL goal <70   5. Educated about COVID-19 virus infection   6. Chest pain of uncertain etiology    PLAN:    In order of problems listed above:  1. Pression no angina, likely related to recurrent restenosis in the LAD territory.  Symptoms are very similar to April when he underwent redilatation of a metal jacket and LAD territory guided by IVUS.  Under the circumstances and with near maximal medical therapy, we will restudy the  patient and perform PCI if indicated.  In the interval until the procedure in 48 hours, increase amlodipine to 10 mg/day. 2. Currently in sinus rhythm/sinus  bradycardia. 3. Continue high intensity statin therapy. 4. Continue atorvastatin 80 mg/day. 5. COVID-19 vaccinated and practicing mitigation. 6. Increase amlodipine to 10 mg/day.  The patient was counseled to undergo left heart catheterization, coronary angiography, and possible percutaneous coronary intervention with stent implantation. The procedural risks and benefits were discussed in detail. The risks discussed included death, stroke, myocardial infarction, life-threatening bleeding, limb ischemia, kidney injury, allergy, and possible emergency cardiac surgery. The risk of these significant complications were estimated to occur less than 1% of the time. After discussion, the patient has agreed to proceed.     Medication Adjustments/Labs and Tests Ordered: Current medicines are reviewed at length with the patient today.  Concerns regarding medicines are outlined above.  Orders Placed This Encounter  Procedures  . Basic metabolic panel  . CBC  . EKG 12-Lead   Meds ordered this encounter  Medications  . amLODipine (NORVASC) 10 MG tablet    Sig: Take 1 tablet (10 mg total) by mouth daily.    Dispense:  90 tablet    Refill:  3    Dose change    Patient Instructions  Medication Instructions:  1) INCREASE Amlodipine to $RemoveBefor'10mg'hNNUPXYgRyoo$  once daily  *If you need a refill on your cardiac medications before your next appointment, please call your pharmacy*   Lab Work: BMET and CBC today  If you have labs (blood work) drawn today and your tests are completely normal, you will receive your results only by: Marland Kitchen MyChart Message (if you have MyChart) OR . A paper copy in the mail If you have any lab test that is abnormal or we need to change your treatment, we will call you to review the results.   Testing/Procedures: Your physician has requested that you have a cardiac catheterization. Cardiac catheterization is used to diagnose and/or treat various heart conditions. Doctors may recommend this  procedure for a number of different reasons. The most common reason is to evaluate chest pain. Chest pain can be a symptom of coronary artery disease (CAD), and cardiac catheterization can show whether plaque is narrowing or blocking your heart's arteries. This procedure is also used to evaluate the valves, as well as measure the blood flow and oxygen levels in different parts of your heart. For further information please visit [email or web address]. Please follow instruction sheet, as given.   Follow-Up: At [facility], you and your health needs are our priority.  As part of our continuing mission to provide you with exceptional heart care, we have created designated Provider Care Teams.  These Care Teams include your primary Cardiologist (physician) and Advanced Practice Providers (APPs -  Physician Assistants and Nurse Practitioners) who all work together to provide you with the care you need, when you need it.  We recommend signing up for the patient portal called "MyChart".  Sign up information is provided on this After Visit Summary.  MyChart is used to connect with patients for Virtual Visits (Telemedicine).  Patients are able to view lab/test results, encounter notes, upcoming appointments, etc.  Non-urgent messages can be sent to your provider as well.   To learn more about what you can do with MyChart, go to [email or web address].    Your next appointment:   2-3 week(s) after cath  The format for your next appointment:   In Person  Provider:   You may see  Sinclair Grooms, MD or one of the following Advanced Practice Providers on your designated Care Team:    Truitt Merle, NP  Cecilie Kicks, NP  Kathyrn Drown, NP    Other Instructions  Due to recent COVID-19 restrictions implemented by our local and state authorities and in an effort to keep both patients and staff as safe as possible, our hospital system requires COVID-19 testing prior to certain scheduled hospital  procedures.  Please go to [place]. [place], [place] [address] on 12/14/19 at 12:50PM  .  This is a drive up testing site.  You will not need to exit your vehicle.  You must agree to self-quarantine from the time of your testing until the procedure date on 12/16/19.  This should included staying home with ONLY the people you live with.  Avoid take-out, grocery store shopping or leaving the house for any non-emergent reason.  Failure to have your COVID-19 test done on the date and time you have been scheduled will result in cancellation of your procedure.  Please call our office at [phone] if you have any questions.    [place] OFFICE [place], [place] [address] Dept: [phone] Loc: [place]  12/14/2019  You are scheduled for a Cardiac Catheterization on Wednesday, December 8 with Dr. Daneen Schick.  1. Please arrive at the [facility] (Main Entrance A) at [facility]: [address] [place], [place] [address] at 11:00 AM (This time is two hours before your procedure to ensure your preparation). Free valet parking service is available.   Special note: Every effort is made to have your procedure done on time. Please understand that emergencies sometimes delay scheduled procedures.  2. Diet: Do not eat solid foods after midnight.  The patient may have clear liquids until 5am upon the day of the procedure.  3. Labs: You will have labs drawn today.  4. Medication instructions in preparation for your procedure:   Contrast Allergy: No   Do not take your Furosemide, Glipizide, Toujeo, or Spironolactone the morning of the procedure.  On the morning of your procedure, take your Aspirin and any morning medicines NOT listed above.  You may use sips of water.  5. Plan for one night stay--bring personal belongings. 6. Bring a current list of your medications and  current insurance cards. 7. You MUST have a responsible person to drive you home. 8. Someone MUST be with you the first 24 hours after you arrive home or your discharge will be delayed. 9. Please wear clothes that are easy to get on and off and wear slip-on shoes.  Thank you for allowing [place] to care for you!   -- [facility]     Signed, Sinclair Grooms, MD  12/14/2019 11:13 AM    [place]

## 2019-12-14 NOTE — Patient Instructions (Addendum)
Medication Instructions:  1) INCREASE Amlodipine to 10mg  once daily  *If you need a refill on your cardiac medications before your next appointment, please call your pharmacy*   Lab Work: BMET and CBC today  If you have labs (blood work) drawn today and your tests are completely normal, you will receive your results only by: MyChart Message (if you have MyChart) OR . A paper copy in the mail If you have any lab test that is abnormal or we need to change your treatment, we will call you to review the results.   Testing/Procedures: Your physician has requested that you have a cardiac catheterization. Cardiac catheterization is used to diagnose and/or treat various heart conditions. Doctors may recommend this procedure for a number of different reasons. The most common reason is to evaluate chest pain. Chest pain can be a symptom of coronary artery disease (CAD), and cardiac catheterization can show whether plaque is narrowing or blocking your heart's arteries. This procedure is also used to evaluate the valves, as well as measure the blood flow and oxygen levels in different parts of your heart. For further information please visit Marland Kitchen. Please follow instruction sheet, as given.   Follow-Up: At [facility], you and your health needs are our priority.  As part of our continuing mission to provide you with exceptional heart care, we have created designated Provider Care Teams.  These Care Teams include your primary Cardiologist (physician) and Advanced Practice Providers (APPs -  Physician Assistants and Nurse Practitioners) who all work together to provide you with the care you need, when you need it.  We recommend signing up for the patient portal called "MyChart".  Sign up information is provided on this After Visit Summary.  MyChart is used to connect with patients for Virtual Visits (Telemedicine).  Patients are able to view lab/test results, encounter notes, upcoming  appointments, etc.  Non-urgent messages can be sent to your provider as well.   To learn more about what you can do with MyChart, go to [facility].    Your next appointment:   2-3 week(s) after cath  The format for your next appointment:   In Person  Provider:   You may see [email or web address], MD or one of the following Advanced Practice Providers on your designated Care Team:    Lesleigh Noe, NP  Norma Fredrickson, NP  Nada Boozer, NP    Other Instructions  Due to recent COVID-19 restrictions implemented by our local and state authorities and in an effort to keep both patients and staff as safe as possible, our hospital system requires COVID-19 testing prior to certain scheduled hospital procedures.  Please go to [address] [place], [place] on 12/14/19 at 12:50PM  .  This is a drive up testing site.  You will not need to exit your vehicle.  You must agree to self-quarantine from the time of your testing until the procedure date on 12/16/19.  This should included staying home with ONLY the people you live with.  Avoid take-out, grocery store shopping or leaving the house for any non-emergent reason.  Failure to have your COVID-19 test done on the date and time you have been scheduled will result in cancellation of your procedure.  Please call our office at [phone] if you have any questions.    [facility] CARDIOVASCULAR DIVISION [facility] OFFICE [address] [place], [address] [place] Dept: [phone] Loc: [phone]  [phone]  [place]  12/14/2019  You are scheduled for a Cardiac Catheterization on Wednesday, December 8 with Dr. Verdis Prime.  1. Please arrive at the [facility] (Main Entrance A) at [facility]: [address] [place], [place] [address] at 11:00 AM (This time is two hours before your procedure to ensure your preparation). Free valet parking service is available.   Special  note: Every effort is made to have your procedure done on time. Please understand that emergencies sometimes delay scheduled procedures.  2. Diet: Do not eat solid foods after midnight.  The patient may have clear liquids until 5am upon the day of the procedure.  3. Labs: You will have labs drawn today.  4. Medication instructions in preparation for your procedure:   Contrast Allergy: No   Do not take your Furosemide, Glipizide, Toujeo, or Spironolactone the morning of the procedure.  On the morning of your procedure, take your Aspirin and any morning medicines NOT listed above.  You may use sips of water.  5. Plan for one night stay--bring personal belongings. 6. Bring a current list of your medications and current insurance cards. 7. You MUST have a responsible person to drive you home. 8. Someone MUST be with you the first 24 hours after you arrive home or your discharge will be delayed. 9. Please wear clothes that are easy to get on and off and wear slip-on shoes.  Thank you for allowing [place] to care for you!   --  Invasive Cardiovascular services

## 2019-12-15 ENCOUNTER — Telehealth: Payer: Self-pay | Admitting: *Deleted

## 2019-12-15 LAB — BASIC METABOLIC PANEL
BUN/Creatinine Ratio: 14 (ref 10–24)
BUN: 16 mg/dL (ref 8–27)
CO2: 23 mmol/L (ref 20–29)
Calcium: 9.7 mg/dL (ref 8.6–10.2)
Chloride: 103 mmol/L (ref 96–106)
Creatinine, Ser: 1.16 mg/dL (ref 0.76–1.27)
GFR calc Af Amer: 78 mL/min/{1.73_m2} (ref >59)
GFR calc non Af Amer: 67 mL/min/{1.73_m2} (ref >59)
Glucose: 129 mg/dL — ABNORMAL HIGH (ref 65–99)
Potassium: 5.2 mmol/L (ref 3.5–5.2)
Sodium: 141 mmol/L (ref 134–144)

## 2019-12-15 LAB — CBC
Hematocrit: 39.1 % (ref 37.5–51.0)
Hemoglobin: 13 g/dL (ref 13.0–17.7)
MCH: 32.2 pg (ref 26.6–33.0)
MCHC: 33.2 g/dL (ref 31.5–35.7)
MCV: 97 fL (ref 79–97)
Platelets: 162 10*3/uL (ref 150–450)
RBC: 4.04 x10E6/uL — ABNORMAL LOW (ref 4.14–5.80)
RDW: 12.5 % (ref 11.6–15.4)
WBC: 7.4 10*3/uL (ref 3.4–10.8)

## 2019-12-15 NOTE — Telephone Encounter (Signed)
Pt contacted pre-catheterization scheduled at [facility] for: Wednesday December 16, 2019 1 PM Verified arrival time and place: [facility] Main Entrance A [facility]) at: 11 AM   No solid food after midnight prior to cath, clear liquids until 5 AM day of procedure.  Hold: Toujeo-AM of procedure Lasix-AM of procedure Spironolactone-AM of procedure Glipizide-pt states he takes this HS not AM  Except hold medications AM meds can be  taken pre-cath with sips of water including: ASA 81 mg Effient 10 mg  Confirmed patient has responsible adult to drive home post procedure and be with patient first 24 hours after arriving home: yes  You are allowed ONE visitor in the waiting room during the time you are at the hospital for your procedure. Both you and your visitor must wear a mask once you enter the hospital.       COVID-19 Pre-Screening Questions:  . In the past 14 days have you had any symptoms concerning for COVID-19 infection (fever, chills, cough, or new shortness of breath)? no . In the past 14 days have you been around anyone with known Covid 19? No  Reviewed procedure/mask/visitor instructions, COVID-19 questions with patient.

## 2019-12-16 ENCOUNTER — Encounter (HOSPITAL_COMMUNITY): Payer: Self-pay | Admitting: Interventional Cardiology

## 2019-12-16 ENCOUNTER — Encounter (HOSPITAL_COMMUNITY)
Admission: RE | Disposition: A | Discharge status: Home / Self Care | Payer: Self-pay | Source: Home / Self Care | Attending: Interventional Cardiology

## 2019-12-16 ENCOUNTER — Ambulatory Visit (HOSPITAL_COMMUNITY)
Admission: RE | Admit: 2019-12-16 | Discharge: 2019-12-17 | Disposition: A | Discharge status: Home / Self Care | Payer: BC Managed Care – PPO | Attending: Interventional Cardiology | Admitting: Interventional Cardiology

## 2019-12-16 ENCOUNTER — Other Ambulatory Visit: Payer: Self-pay

## 2019-12-16 DIAGNOSIS — Z794 Long term (current) use of insulin: Secondary | ICD-10-CM | POA: Diagnosis not present

## 2019-12-16 DIAGNOSIS — I25810 Atherosclerosis of coronary artery bypass graft(s) without angina pectoris: Secondary | ICD-10-CM | POA: Diagnosis present

## 2019-12-16 DIAGNOSIS — Z9861 Coronary angioplasty status: Secondary | ICD-10-CM

## 2019-12-16 DIAGNOSIS — I209 Angina pectoris, unspecified: Secondary | ICD-10-CM | POA: Diagnosis not present

## 2019-12-16 DIAGNOSIS — Z7901 Long term (current) use of anticoagulants: Secondary | ICD-10-CM | POA: Diagnosis not present

## 2019-12-16 DIAGNOSIS — I25709 Atherosclerosis of coronary artery bypass graft(s), unspecified, with unspecified angina pectoris: Secondary | ICD-10-CM | POA: Diagnosis not present

## 2019-12-16 DIAGNOSIS — I10 Essential (primary) hypertension: Secondary | ICD-10-CM | POA: Insufficient documentation

## 2019-12-16 DIAGNOSIS — I480 Paroxysmal atrial fibrillation: Secondary | ICD-10-CM | POA: Diagnosis not present

## 2019-12-16 DIAGNOSIS — Z888 Allergy status to other drugs, medicaments and biological substances status: Secondary | ICD-10-CM | POA: Diagnosis not present

## 2019-12-16 DIAGNOSIS — E785 Hyperlipidemia, unspecified: Secondary | ICD-10-CM | POA: Insufficient documentation

## 2019-12-16 DIAGNOSIS — Z7982 Long term (current) use of aspirin: Secondary | ICD-10-CM | POA: Insufficient documentation

## 2019-12-16 DIAGNOSIS — I25110 Atherosclerotic heart disease of native coronary artery with unstable angina pectoris: Secondary | ICD-10-CM | POA: Diagnosis present

## 2019-12-16 DIAGNOSIS — Z880 Allergy status to penicillin: Secondary | ICD-10-CM | POA: Diagnosis not present

## 2019-12-16 DIAGNOSIS — I25119 Atherosclerotic heart disease of native coronary artery with unspecified angina pectoris: Secondary | ICD-10-CM

## 2019-12-16 DIAGNOSIS — Z951 Presence of aortocoronary bypass graft: Secondary | ICD-10-CM | POA: Insufficient documentation

## 2019-12-16 HISTORY — PX: CORONARY BALLOON ANGIOPLASTY: CATH118233

## 2019-12-16 HISTORY — PX: INTRAVASCULAR ULTRASOUND/IVUS: CATH118244

## 2019-12-16 HISTORY — PX: LEFT HEART CATH AND CORS/GRAFTS ANGIOGRAPHY: CATH118250

## 2019-12-16 LAB — GLUCOSE, CAPILLARY
Glucose-Capillary: 95 mg/dL (ref 70–99)
Glucose-Capillary: 78 mg/dL (ref 70–99)
Glucose-Capillary: 91 mg/dL (ref 70–99)

## 2019-12-16 LAB — CBC
HCT: 41.5 % (ref 39.0–52.0)
Hemoglobin: 14.4 g/dL (ref 13.0–17.0)
MCH: 32.9 pg (ref 26.0–34.0)
MCHC: 34.7 g/dL (ref 30.0–36.0)
MCV: 94.7 fL (ref 80.0–100.0)
Platelets: 176 10*3/uL (ref 150–400)
RBC: 4.38 MIL/uL (ref 4.22–5.81)
RDW: 12.7 % (ref 11.5–15.5)
WBC: 8.7 10*3/uL (ref 4.0–10.5)
nRBC: 0 % (ref 0.0–0.2)

## 2019-12-16 LAB — CREATININE, SERUM
Creatinine, Ser: 0.96 mg/dL (ref 0.61–1.24)
GFR, Estimated: >60 mL/min (ref >60)

## 2019-12-16 LAB — POCT ACTIVATED CLOTTING TIME: Activated Clotting Time: 297 seconds

## 2019-12-16 SURGERY — LEFT HEART CATH AND CORS/GRAFTS ANGIOGRAPHY
Anesthesia: LOCAL

## 2019-12-16 MED ORDER — NITROGLYCERIN 0.4 MG SL SUBL: 0.4000 mg | SUBLINGUAL_TABLET | SUBLINGUAL | Status: DC | PRN

## 2019-12-16 MED ORDER — VERAPAMIL HCL 2.5 MG/ML IV SOLN
INTRAVENOUS | Status: AC
  Filled 2019-12-16: qty 2

## 2019-12-16 MED ORDER — OXYCODONE HCL 5 MG PO TABS
5.0000 mg | ORAL_TABLET | ORAL | Status: DC | PRN
  Administered 2019-12-16 – 2019-12-17 (×2): 5 mg via ORAL

## 2019-12-16 MED ORDER — NITROGLYCERIN 1 MG/10 ML FOR IR/CATH LAB
INTRA_ARTERIAL | Status: AC
  Filled 2019-12-16: qty 10

## 2019-12-16 MED ORDER — SODIUM CHLORIDE 0.9% FLUSH: 3.0000 mL | INTRAVENOUS | Status: DC | PRN

## 2019-12-16 MED ORDER — SODIUM CHLORIDE 0.9 % IV SOLN: 250.0000 mL | INTRAVENOUS | Status: DC | PRN

## 2019-12-16 MED ORDER — ENSURE ENLIVE PO LIQD: 237.0000 mL | Freq: Two times a day (BID) | ORAL | Status: DC

## 2019-12-16 MED ORDER — HYDRALAZINE HCL 20 MG/ML IJ SOLN: 10.0000 mg | INTRAMUSCULAR | Status: AC | PRN

## 2019-12-16 MED ORDER — HEPARIN (PORCINE) IN NACL 1000-0.9 UT/500ML-% IV SOLN
INTRAVENOUS | Status: DC | PRN
  Administered 2019-12-16 (×2): 500 mL

## 2019-12-16 MED ORDER — OXYCODONE-ACETAMINOPHEN 5-325 MG PO TABS
ORAL_TABLET | ORAL | Status: AC
  Filled 2019-12-16: qty 1

## 2019-12-16 MED ORDER — SODIUM CHLORIDE 0.9 % IV SOLN: INTRAVENOUS | Status: AC

## 2019-12-16 MED ORDER — MIDAZOLAM HCL 2 MG/2ML IJ SOLN
INTRAMUSCULAR | Status: AC
  Filled 2019-12-16: qty 2

## 2019-12-16 MED ORDER — FENTANYL CITRATE (PF) 100 MCG/2ML IJ SOLN
INTRAMUSCULAR | Status: AC
  Filled 2019-12-16: qty 2

## 2019-12-16 MED ORDER — PANTOPRAZOLE SODIUM 40 MG PO TBEC
40.0000 mg | DELAYED_RELEASE_TABLET | Freq: Every day | ORAL | Status: DC
  Administered 2019-12-17: 40 mg via ORAL
  Filled 2019-12-16: qty 1

## 2019-12-16 MED ORDER — LIDOCAINE HCL (PF) 1 % IJ SOLN
INTRAMUSCULAR | Status: DC | PRN
  Administered 2019-12-16: 20 mL via INTRADERMAL

## 2019-12-16 MED ORDER — FENTANYL CITRATE (PF) 100 MCG/2ML IJ SOLN
INTRAMUSCULAR | Status: DC | PRN
  Administered 2019-12-16 (×4): 50 ug via INTRAVENOUS

## 2019-12-16 MED ORDER — ASPIRIN 81 MG PO CHEW: 81.0000 mg | CHEWABLE_TABLET | ORAL | Status: DC

## 2019-12-16 MED ORDER — PROMETHAZINE HCL 25 MG PO TABS: 25.0000 mg | ORAL_TABLET | Freq: Two times a day (BID) | ORAL | Status: DC | PRN

## 2019-12-16 MED ORDER — INSULIN GLARGINE 100 UNIT/ML ~~LOC~~ SOLN
28.0000 [IU] | Freq: Every day | SUBCUTANEOUS | Status: DC
  Administered 2019-12-16: 28 [IU] via SUBCUTANEOUS
  Filled 2019-12-16 (×2): qty 0.28

## 2019-12-16 MED ORDER — IOHEXOL 350 MG/ML SOLN
INTRAVENOUS | Status: DC | PRN
  Administered 2019-12-16: 185 mL via INTRA_ARTERIAL

## 2019-12-16 MED ORDER — SODIUM CHLORIDE 0.9% FLUSH
3.0000 mL | Freq: Two times a day (BID) | INTRAVENOUS | Status: DC
  Administered 2019-12-16 – 2019-12-17 (×2): 3 mL via INTRAVENOUS

## 2019-12-16 MED ORDER — PRASUGREL HCL 10 MG PO TABS: 10.0000 mg | ORAL_TABLET | ORAL | Status: DC

## 2019-12-16 MED ORDER — HEPARIN SODIUM (PORCINE) 5000 UNIT/ML IJ SOLN
5000.0000 [IU] | Freq: Three times a day (TID) | INTRAMUSCULAR | Status: DC
  Administered 2019-12-17: 5000 [IU] via SUBCUTANEOUS
  Filled 2019-12-16: qty 1

## 2019-12-16 MED ORDER — AMLODIPINE BESYLATE 5 MG PO TABS
5.0000 mg | ORAL_TABLET | Freq: Every day | ORAL | Status: DC
  Administered 2019-12-16 – 2019-12-17 (×2): 5 mg via ORAL
  Filled 2019-12-16: qty 1

## 2019-12-16 MED ORDER — NITROGLYCERIN 1 MG/10 ML FOR IR/CATH LAB
INTRA_ARTERIAL | Status: DC | PRN
  Administered 2019-12-16 (×4): 200 ug via INTRACORONARY

## 2019-12-16 MED ORDER — TIZANIDINE HCL 4 MG PO TABS
8.0000 mg | ORAL_TABLET | Freq: Every day | ORAL | Status: DC
  Administered 2019-12-16: 8 mg via ORAL
  Filled 2019-12-16: qty 2

## 2019-12-16 MED ORDER — PRASUGREL HCL 10 MG PO TABS
10.0000 mg | ORAL_TABLET | Freq: Every day | ORAL | Status: DC
  Administered 2019-12-17: 10 mg via ORAL
  Filled 2019-12-16: qty 1

## 2019-12-16 MED ORDER — SODIUM CHLORIDE 0.9 % WEIGHT BASED INFUSION: 1.0000 mL/kg/h | INTRAVENOUS | Status: DC

## 2019-12-16 MED ORDER — MIDAZOLAM HCL 2 MG/2ML IJ SOLN
INTRAMUSCULAR | Status: DC | PRN
  Administered 2019-12-16: 1 mg via INTRAVENOUS
  Administered 2019-12-16: 0.5 mg via INTRAVENOUS
  Administered 2019-12-16: 1 mg via INTRAVENOUS
  Administered 2019-12-16: 0.5 mg via INTRAVENOUS

## 2019-12-16 MED ORDER — LABETALOL HCL 5 MG/ML IV SOLN: 10.0000 mg | INTRAVENOUS | Status: AC | PRN

## 2019-12-16 MED ORDER — SPIRONOLACTONE 25 MG PO TABS
25.0000 mg | ORAL_TABLET | Freq: Every day | ORAL | Status: DC
  Administered 2019-12-16 – 2019-12-17 (×2): 25 mg via ORAL
  Filled 2019-12-16 (×2): qty 1

## 2019-12-16 MED ORDER — ASPIRIN 81 MG PO CHEW: 81.0000 mg | CHEWABLE_TABLET | Freq: Every day | ORAL | Status: DC

## 2019-12-16 MED ORDER — LIDOCAINE HCL (PF) 1 % IJ SOLN
INTRAMUSCULAR | Status: AC
  Filled 2019-12-16: qty 30

## 2019-12-16 MED ORDER — GLIPIZIDE ER 10 MG PO TB24
10.0000 mg | ORAL_TABLET | Freq: Every day | ORAL | Status: DC
  Administered 2019-12-17: 10 mg via ORAL
  Filled 2019-12-16: qty 1

## 2019-12-16 MED ORDER — ASPIRIN EC 81 MG PO TBEC
81.0000 mg | DELAYED_RELEASE_TABLET | Freq: Every day | ORAL | Status: DC
  Administered 2019-12-17: 81 mg via ORAL
  Filled 2019-12-16: qty 1

## 2019-12-16 MED ORDER — METOPROLOL TARTRATE 50 MG PO TABS
100.0000 mg | ORAL_TABLET | Freq: Two times a day (BID) | ORAL | Status: DC
  Administered 2019-12-16 – 2019-12-17 (×2): 100 mg via ORAL
  Filled 2019-12-16 (×3): qty 2

## 2019-12-16 MED ORDER — FUROSEMIDE 20 MG PO TABS
20.0000 mg | ORAL_TABLET | Freq: Every day | ORAL | Status: DC
  Administered 2019-12-16 – 2019-12-17 (×2): 20 mg via ORAL
  Filled 2019-12-16 (×2): qty 1

## 2019-12-16 MED ORDER — ONDANSETRON HCL 4 MG/2ML IJ SOLN: 4.0000 mg | Freq: Four times a day (QID) | INTRAMUSCULAR | Status: DC | PRN

## 2019-12-16 MED ORDER — SODIUM CHLORIDE 0.9 % IV SOLN
INTRAVENOUS | Status: DC | PRN
  Administered 2019-12-16: 1.75 mg/kg/h via INTRAVENOUS

## 2019-12-16 MED ORDER — OXYCODONE HCL 5 MG PO TABS
ORAL_TABLET | ORAL | Status: AC
  Filled 2019-12-16: qty 1

## 2019-12-16 MED ORDER — TIZANIDINE HCL 4 MG PO TABS
4.0000 mg | ORAL_TABLET | Freq: Two times a day (BID) | ORAL | Status: DC
  Filled 2019-12-16: qty 1

## 2019-12-16 MED ORDER — ISOSORBIDE MONONITRATE ER 60 MG PO TB24
120.0000 mg | ORAL_TABLET | Freq: Every day | ORAL | Status: DC
  Administered 2019-12-16 – 2019-12-17 (×2): 120 mg via ORAL
  Filled 2019-12-16 (×2): qty 2

## 2019-12-16 MED ORDER — OXYCODONE HCL 5 MG PO TABS
5.0000 mg | ORAL_TABLET | ORAL | Status: DC | PRN
  Filled 2019-12-16: qty 1

## 2019-12-16 MED ORDER — BIVALIRUDIN BOLUS VIA INFUSION - CUPID
INTRAVENOUS | Status: DC | PRN
  Administered 2019-12-16: 54 mg via INTRAVENOUS

## 2019-12-16 MED ORDER — BIVALIRUDIN TRIFLUOROACETATE 250 MG IV SOLR
INTRAVENOUS | Status: AC
  Filled 2019-12-16: qty 250

## 2019-12-16 MED ORDER — PRASUGREL HCL 10 MG PO TABS: 10.0000 mg | ORAL_TABLET | Freq: Every day | ORAL | Status: DC

## 2019-12-16 MED ORDER — SODIUM CHLORIDE 0.9 % WEIGHT BASED INFUSION
3.0000 mL/kg/h | INTRAVENOUS | Status: DC
  Administered 2019-12-16: 3 mL/kg/h via INTRAVENOUS

## 2019-12-16 MED ORDER — OXYCODONE-ACETAMINOPHEN 10-325 MG PO TABS
1.0000 | ORAL_TABLET | ORAL | Status: DC | PRN
  Administered 2019-12-16: 1 via ORAL

## 2019-12-16 MED ORDER — ACETAMINOPHEN 325 MG PO TABS: 650.0000 mg | ORAL_TABLET | ORAL | Status: DC | PRN

## 2019-12-16 MED ORDER — OXYCODONE-ACETAMINOPHEN 5-325 MG PO TABS
1.0000 | ORAL_TABLET | ORAL | Status: DC | PRN
  Administered 2019-12-17: 1 via ORAL
  Filled 2019-12-16: qty 1

## 2019-12-16 MED ORDER — AMLODIPINE BESYLATE 5 MG PO TABS
ORAL_TABLET | ORAL | Status: AC
  Filled 2019-12-16: qty 1

## 2019-12-16 MED ORDER — HEPARIN (PORCINE) IN NACL 1000-0.9 UT/500ML-% IV SOLN
INTRAVENOUS | Status: AC
  Filled 2019-12-16: qty 1000

## 2019-12-16 SURGICAL SUPPLY — 22 items
BALLN SAPPHIRE 2.25X15 (BALLOONS) ×2
BALLN SAPPHIRE 2.5X20 (BALLOONS) ×2
BALLN ~~LOC~~ EMERGE MR 2.25X20 (BALLOONS) ×2
BALLN ~~LOC~~ EUPHORA RX 2.75X15 (BALLOONS) ×2
BALLOON SAPPHIRE 2.25X15 (BALLOONS) IMPLANT
BALLOON SAPPHIRE 2.5X20 (BALLOONS) IMPLANT
BALLOON ~~LOC~~ EMERGE MR 2.25X20 (BALLOONS) IMPLANT
BALLOON ~~LOC~~ EUPHORA RX 2.75X15 (BALLOONS) IMPLANT
CATH INFINITI 5FR MPB2 (CATHETERS) ×1 IMPLANT
CATH INFINITI 5FR MULTPACK ANG (CATHETERS) ×1 IMPLANT
CATH LAUNCHER 6FR EBU 3.75 (CATHETERS) ×1 IMPLANT
CATH OPTICROSS HD (CATHETERS) ×1 IMPLANT
KIT ENCORE 26 ADVANTAGE (KITS) ×1 IMPLANT
KIT HEART LEFT (KITS) ×2 IMPLANT
PACK CARDIAC CATHETERIZATION (CUSTOM PROCEDURE TRAY) ×2 IMPLANT
SHEATH PINNACLE 5F 10CM (SHEATH) ×1 IMPLANT
SHEATH PINNACLE 6F 10CM (SHEATH) ×1 IMPLANT
SLED PULL BACK IVUS (MISCELLANEOUS) ×1 IMPLANT
TRANSDUCER W/STOPCOCK (MISCELLANEOUS) ×2 IMPLANT
TUBING CIL FLEX 10 FLL-RA (TUBING) ×2 IMPLANT
WIRE ASAHI PROWATER 180CM (WIRE) ×1 IMPLANT
WIRE EMERALD 3MM-J .035X150CM (WIRE) ×1 IMPLANT

## 2019-12-16 NOTE — Progress Notes (Signed)
CARDIOLOGY OFFICE NOTE  Date:  12/30/2019    [name] Date of Birth: 1955-01-18 Medical Record #[id number]  PCP:  Mila Palmer, PA-C  Cardiologist:  Tamala Julian   Chief Complaint  Patient presents with  . Follow-up    Seen for Dr. Tamala Julian    History of Present Illness: Terry Nelson is a 64 y.o. male who presents today for a follow up visit. Seen for Dr. Tamala Julian.   He has a history of known CAD with remote CABG in 2002 by Dr. Roxan Hockey. He has multiple overlapping proximal to distal LAD stents with ISR, PAF with prior ablation, HtN, HLD, and CKD.  Seen earlier this month by Dr. Tamala Julian after telephone call regarding chest pain and using more NTG. He refused to call EMS. Imdur was increased. He had had recent PCI within the LAD which is a full metal jacket - despite balloon PCI there was still residual obstruction. On max dose of Imdur and beta blocker. Does not tolerate Ranexa.    Comes in today. Here alone. He is feeling better since his cath and balloon PCI to the LAD in several spots. Reino does feel better. Says that it "took a little longer this time", but he is better. No problems with his groin.  He is intolerant to Plavix. He remains on Effient and aspirin. He sees his PCP in early January with labs and will send our way. He has no real concerns and feels like he is doing ok.   Past Medical History:  Diagnosis Date  . Anxiety state   . Arteriosclerosis of coronary artery   . Body mass index (bmi) 26.0-26.9, adult   . Chest pain   . CHF (congestive heart failure) ([place])   . Chronic renal impairment   . DDD (degenerative disc disease), lumbar   . DM (diabetes mellitus) ([place])   . Gastritis   . Headache   . Hemorrhage   . Hyperlipidemia   . Hypertension   . Kidney stones   . Lumbago   . Neck pain   . Paroxysmal A-fib ([place])   . Pernicious anemia   . Unstable angina [facility])     Past Surgical History:  Procedure Laterality Date  . ABLATION  2017  . CARDIAC  CATHETERIZATION    . CORONARY ARTERY BYPASS GRAFT    . CORONARY BALLOON ANGIOPLASTY N/A 04/27/2019   Procedure: CORONARY BALLOON ANGIOPLASTY;  Surgeon: Nelva Bush, MD;  Location: [place] [facility];  Service: Cardiovascular;  Laterality: N/A;  . CORONARY BALLOON ANGIOPLASTY N/A 12/16/2019   Procedure: CORONARY BALLOON ANGIOPLASTY;  Surgeon: Belva Crome, MD;  Location: [place] [facility];  Service: Cardiovascular;  Laterality: N/A;  . INTRAVASCULAR ULTRASOUND/IVUS N/A 04/27/2019   Procedure: Intravascular Ultrasound/IVUS;  Surgeon: Nelva Bush, MD;  Location: [place] [facility];  Service: Cardiovascular;  Laterality: N/A;  . INTRAVASCULAR ULTRASOUND/IVUS N/A 12/16/2019   Procedure: Intravascular Ultrasound/IVUS;  Surgeon: Belva Crome, MD;  Location: [place] [facility];  Service: Cardiovascular;  Laterality: N/A;  . LEFT HEART CATH AND CORS/GRAFTS ANGIOGRAPHY N/A 04/09/2019   Procedure: LEFT HEART CATH AND CORS/GRAFTS ANGIOGRAPHY;  Surgeon: Belva Crome, MD;  Location: [place] [facility];  Service: Cardiovascular;  Laterality: N/A;  . LEFT HEART CATH AND CORS/GRAFTS ANGIOGRAPHY N/A 12/16/2019   Procedure: LEFT HEART CATH AND CORS/GRAFTS ANGIOGRAPHY;  Surgeon: Belva Crome, MD;  Location: [place] [facility];  Service: Cardiovascular;  Laterality: N/A;     Medications:  Current Meds  Medication Sig  . amLODipine (NORVASC) 10 MG tablet Take 0.5 tablets (5 mg total) by mouth daily.  Marland Kitchen aspirin EC 81 MG tablet Take 81 mg by mouth daily.  Marland Kitchen atorvastatin (LIPITOR) 80 MG tablet Take 80 mg by mouth at bedtime.  . Blood Glucose Monitoring Suppl (CONTOUR NEXT MONITOR) w/Device KIT daily.  . furosemide (LASIX) 40 MG tablet Take 20 mg by mouth daily.   Marland Kitchen glipiZIDE (GLUCOTROL XL) 10 MG 24 hr tablet Take 10 mg by mouth daily with breakfast.   . glucose blood (CONTOUR NEXT TEST) test strip by Does not apply route.  . Insulin Pen Needle (BD PEN NEEDLE NANO U/F) 32G X 4 MM MISC USE ONCE AS  DIRECTED  . isosorbide mononitrate (IMDUR) 60 MG 24 hr tablet Take 120 mg by mouth daily.  . metoprolol tartrate (LOPRESSOR) 100 MG tablet Take 100 mg by mouth 2 (two) times daily.  . nitroGLYCERIN (NITROSTAT) 0.4 MG SL tablet Place 0.4 mg under the tongue every 5 (five) minutes as needed for chest pain.  Marland Kitchen oxyCODONE-acetaminophen (PERCOCET) 10-325 MG tablet Take 1 tablet by mouth every 4 (four) hours as needed for pain.  . pantoprazole (PROTONIX) 40 MG tablet Take 40 mg by mouth daily.  . prasugrel (EFFIENT) 10 MG TABS tablet Take 10 mg by mouth daily.  . promethazine (PHENERGAN) 25 MG tablet Take 25 mg by mouth 2 (two) times daily as needed for nausea or vomiting.  Marland Kitchen spironolactone (ALDACTONE) 25 MG tablet Take 25 mg by mouth daily.  Marland Kitchen tiZANidine (ZANAFLEX) 4 MG tablet Take 4-8 mg by mouth See admin instructions. Take 1 tablet (51m) by mouth twice daily & Take 2 tablets (8 mg) by mouth at bedtime  . TOUJEO SOLOSTAR 300 UNIT/ML Solostar Pen Inject 28 Units into the skin daily.      Allergies: Allergies  Allergen Reactions  . Metoclopramide Other (See Comments)    DYSKINESIAS  . Penicillins Swelling and Rash    Did it involve swelling of the face/tongue/throat, SOB, or low BP? Yes Did it involve sudden or severe rash/hives, skin peeling, or any reaction on the inside of your mouth or nose? Unknown Did you need to seek medical attention at a hospital or doctor's office? Yes When did it last happen?More than 40 years ago If all above answers are "NO", may proceed with cephalosporin use.     Social History: The patient  reports that he has never smoked. He has quit using smokeless tobacco.  His smokeless tobacco use included chew. He reports that he does not drink alcohol and does not use drugs.   Family History: The patient's family history includes Diabetes in his father; Heart disease in an other family member; High blood pressure in his mother; Stroke in his mother.   Review of  Systems: Please see the history of present illness.   All other systems are reviewed and negative.   Physical Exam: VS:  BP 132/78   Pulse 60   Ht 5' 7" (1.702 m)   Wt 156 lb 9.6 oz (71 kg)   SpO2 99%   BMI 24.53 kg/m  .  BMI Body mass index is 24.53 kg/m.  Wt Readings from Last 3 Encounters:  12/30/19 156 lb 9.6 oz (71 kg)  12/17/19 147 lb 11.3 oz (67 kg)  12/14/19 160 lb 12.8 oz (72.9 kg)    General: Pleasant. Alert and in no acute distress. Looks older than his stated age.   Cardiac:  Regular rate and rhythm. No murmurs, rubs, or gallops. No edema.  Respiratory:  Lungs are clear to auscultation bilaterally with normal work of breathing.  GI: Soft and nontender.  MS: No deformity or atrophy. Gait and ROM intact.  Skin: Warm and dry. Color is normal.  Neuro:  Strength and sensation are intact and no gross focal deficits noted.  Psych: Alert, appropriate and with normal affect.   LABORATORY DATA:  EKG:  EKG is not ordered today.    Lab Results  Component Value Date   WBC 10.9 (H) 12/17/2019   HGB 16.5 12/17/2019   HCT 46.6 12/17/2019   PLT 208 12/17/2019   GLUCOSE 102 (H) 12/17/2019   CHOL 136 03/30/2019   TRIG 178 (H) 03/30/2019   HDL 36 (L) 03/30/2019   LDLCALC 70 03/30/2019   ALT 16 03/30/2019   AST 18 03/30/2019   NA 142 12/17/2019   K 3.4 (L) 12/17/2019   CL 100 12/17/2019   CREATININE 1.12 12/17/2019   BUN 10 12/17/2019   CO2 28 12/17/2019   HGBA1C 7.2 (H) 03/30/2019     BNP (last 3 results) No results for input(s): BNP in the last 8760 hours.  ProBNP (last 3 results) No results for input(s): PROBNP in the last 8760 hours.   Other Studies Reviewed Today:  LEFT HEART CATH AND CORS/GRAFTS ANGIOGRAPHY 12/16/2019   Conclusion   Overall, anatomy is unchanged from April 2021 angiogram.  The sequential graft to the first and second obtuse marginal is patent.  The LIMA to the LAD was not selectively engaged because it has been proven  paretic.  Left main is widely patent  Overlapping LAD stents from proximal to apical revealed diffuse greater than 85 to 90% narrowing throughout the distal third of the stented segment.  Circumflex is widely patent and competes with flow from the sequential graft.  Right coronary has moderate diffuse disease and a patent distal RCA to PDA stent.  The mid stent is widely patent without evidence of restenosis.  Overlapping balloon angioplasty from the distal stent margin in the LAD to the mid LAD using various slightly oversized Troy balloons and high-pressure.  Balloon inflations reproduced index symptom.  RECOMMENDATIONS:   Continue aspirin and Brilinta  Continue risk factor modification  Consider balloon coated balloon angioplasty.  We will discuss futility of further procedures/intervention in this territory with the patient.     ASSESSMENT & PLAN:     1. CAD - remote CABG and prior multiple PCIs - now with recent cath and balloon PCI to the LAD in several spots - he has full metal jacket of the LAD. Options limited. Fortunately, symptoms have improved - he is doing well. No change with current regimen.   2. HTN - BP looks good - no changes made today.   3. HLD -  On therapy - having labs with PCP in just a few weeks.   Current medicines are reviewed with the patient today.  The patient does not have concerns regarding medicines other than what has been noted above.  The following changes have been made:  See above.  Labs/ tests ordered today include:   No orders of the defined types were placed in this encounter.    Disposition:   FU with Dr. Tamala Julian in about 4 months. No changes made today.     Patient is agreeable to this plan and will call if any problems develop in the interim.   SignedTruitt Merle, NP  12/30/2019  11:05 AM  [facility] [address] [place], [place]  [address] Phone: [phone] Fax: [phone]

## 2019-12-16 NOTE — Progress Notes (Signed)
Site area: rt groin 77F arterial sheath pulled by Karie Mainland Site Prior to Removal:  Level 0 Pressure Applied For: 30 minutes Manual:   yes Patient Status During Pull:  stable Post Pull Site:  Level 0 Post Pull Instructions Given:   yes Post Pull Pulses Present: rt dp palpable Dressing Applied:  Gauze and tegaderm Bedrest begins @ 1845 Comments:

## 2019-12-16 NOTE — CV Procedure (Signed)
·   Moderately diseased but stable right coronary with patent distal stent.  Widely patent left main  50% ostial LAD followed by full metal jacket beginning in the proximal vessel and extending to the apex.  Distal third of the stented segment is severely and diffusely restenosed.  Widely patent circumflex with patent sequential graft to the obtuse marginal #1 and #2.  Previously demonstrated atretic LIMA to LAD  Intravascular ultrasound demonstrated diffuse multifocal restenosis in the distal one third of LAD.  There is also a borderline stenosis in the ostium of the LAD.  Angioplasty only performed from the very distal margin of the LAD stents using a 2.25 x 20 balloon followed by a 2.5 x 15 mm Mildred balloon followed by a 2.75 x 15 mm Brewster balloon treating the entire distal third of the stented segment.  Balloon inflations reproduce pain that the patient has been having at home.

## 2019-12-16 NOTE — H&P (Signed)
Suspect repeat LAD restenosis. LIMA is atretic. Unable to use right/left radial due to small caliber Radial arteries. Femoral approach will be used.

## 2019-12-16 NOTE — Interval H&P Note (Signed)
Cath Lab Visit (complete for each Cath Lab visit)  Clinical Evaluation Leading to the Procedure:   ACS: Yes.    Non-ACS:    Anginal Classification: CCS IV  Anti-ischemic medical therapy: Maximal Therapy (2 or more classes of medications)  Non-Invasive Test Results: No non-invasive testing performed  Prior CABG: Previous CABG      History and Physical Interval Note:  12/16/2019 2:23 PM  Terry Nelson  has presented today for surgery, with the diagnosis of chest pain.  The various methods of treatment have been discussed with the patient and family. After consideration of risks, benefits and other options for treatment, the patient has consented to  Procedure(s): LEFT HEART CATH AND CORS/GRAFTS ANGIOGRAPHY (N/A) as a surgical intervention.  The patient's history has been reviewed, patient examined, no change in status, stable for surgery.  I have reviewed the patient's chart and labs.  Questions were answered to the patient's satisfaction.     Lyn Records III

## 2019-12-17 ENCOUNTER — Encounter (HOSPITAL_COMMUNITY): Payer: Self-pay | Admitting: Interventional Cardiology

## 2019-12-17 DIAGNOSIS — I25709 Atherosclerosis of coronary artery bypass graft(s), unspecified, with unspecified angina pectoris: Secondary | ICD-10-CM | POA: Diagnosis not present

## 2019-12-17 DIAGNOSIS — Z888 Allergy status to other drugs, medicaments and biological substances status: Secondary | ICD-10-CM | POA: Diagnosis not present

## 2019-12-17 DIAGNOSIS — I10 Essential (primary) hypertension: Secondary | ICD-10-CM | POA: Diagnosis not present

## 2019-12-17 DIAGNOSIS — Z7982 Long term (current) use of aspirin: Secondary | ICD-10-CM | POA: Diagnosis not present

## 2019-12-17 DIAGNOSIS — Z794 Long term (current) use of insulin: Secondary | ICD-10-CM | POA: Diagnosis not present

## 2019-12-17 DIAGNOSIS — Z7901 Long term (current) use of anticoagulants: Secondary | ICD-10-CM | POA: Diagnosis not present

## 2019-12-17 DIAGNOSIS — I480 Paroxysmal atrial fibrillation: Secondary | ICD-10-CM | POA: Diagnosis not present

## 2019-12-17 DIAGNOSIS — Z951 Presence of aortocoronary bypass graft: Secondary | ICD-10-CM | POA: Diagnosis not present

## 2019-12-17 DIAGNOSIS — I25110 Atherosclerotic heart disease of native coronary artery with unstable angina pectoris: Secondary | ICD-10-CM | POA: Diagnosis not present

## 2019-12-17 DIAGNOSIS — E785 Hyperlipidemia, unspecified: Secondary | ICD-10-CM | POA: Diagnosis not present

## 2019-12-17 DIAGNOSIS — I209 Angina pectoris, unspecified: Secondary | ICD-10-CM | POA: Diagnosis not present

## 2019-12-17 DIAGNOSIS — Z880 Allergy status to penicillin: Secondary | ICD-10-CM | POA: Diagnosis not present

## 2019-12-17 LAB — CBC
HCT: 46.6 % (ref 39.0–52.0)
Hemoglobin: 16.5 g/dL (ref 13.0–17.0)
MCH: 33.1 pg (ref 26.0–34.0)
MCHC: 35.4 g/dL (ref 30.0–36.0)
MCV: 93.4 fL (ref 80.0–100.0)
Platelets: 208 10*3/uL (ref 150–400)
RBC: 4.99 MIL/uL (ref 4.22–5.81)
RDW: 12.8 % (ref 11.5–15.5)
WBC: 10.9 10*3/uL — ABNORMAL HIGH (ref 4.0–10.5)
nRBC: 0 % (ref 0.0–0.2)

## 2019-12-17 LAB — BASIC METABOLIC PANEL
Anion gap: 14 (ref 5–15)
BUN: 10 mg/dL (ref 8–23)
CO2: 28 mmol/L (ref 22–32)
Calcium: 10.7 mg/dL — ABNORMAL HIGH (ref 8.9–10.3)
Chloride: 100 mmol/L (ref 98–111)
Creatinine, Ser: 1.12 mg/dL (ref 0.61–1.24)
GFR, Estimated: >60 mL/min (ref >60)
Glucose, Bld: 102 mg/dL — ABNORMAL HIGH (ref 70–99)
Potassium: 3.4 mmol/L — ABNORMAL LOW (ref 3.5–5.1)
Sodium: 142 mmol/L (ref 135–145)

## 2019-12-17 LAB — GLUCOSE, CAPILLARY: Glucose-Capillary: 202 mg/dL — ABNORMAL HIGH (ref 70–99)

## 2019-12-17 MED ORDER — POTASSIUM CHLORIDE CRYS ER 20 MEQ PO TBCR: 40.0000 meq | EXTENDED_RELEASE_TABLET | Freq: Once | ORAL | Status: DC

## 2019-12-17 MED ORDER — AMLODIPINE BESYLATE 10 MG PO TABS: 5.0000 mg | ORAL_TABLET | Freq: Every day | ORAL | Status: DC

## 2019-12-17 MED FILL — Oxycodone w/ Acetaminophen Tab 5-325 MG: ORAL | Qty: 1 | Status: AC

## 2019-12-17 NOTE — Plan of Care (Signed)
Problem: Education: Goal: Knowledge of General Education information will improve Description: Including pain rating scale, medication(s)/side effects and non-pharmacologic comfort measures 12/17/2019 1032 by Durward Fortes, RN Outcome: Adequate for Discharge 12/17/2019 0747 by Durward Fortes, RN Outcome: Progressing   Problem: Health Behavior/Discharge Planning: Goal: Ability to manage health-related needs will improve 12/17/2019 1032 by Durward Fortes, RN Outcome: Adequate for Discharge 12/17/2019 0747 by Durward Fortes, RN Outcome: Progressing   Problem: Clinical Measurements: Goal: Ability to maintain clinical measurements within normal limits will improve 12/17/2019 1032 by Durward Fortes, RN Outcome: Adequate for Discharge 12/17/2019 0747 by Durward Fortes, RN Outcome: Progressing Goal: Will remain free from infection 12/17/2019 1032 by Durward Fortes, RN Outcome: Adequate for Discharge 12/17/2019 0747 by Durward Fortes, RN Outcome: Progressing Goal: Diagnostic test results will improve 12/17/2019 1032 by Durward Fortes, RN Outcome: Adequate for Discharge 12/17/2019 0747 by Durward Fortes, RN Outcome: Progressing Goal: Respiratory complications will improve 12/17/2019 1032 by Durward Fortes, RN Outcome: Adequate for Discharge 12/17/2019 0747 by Durward Fortes, RN Outcome: Progressing Goal: Cardiovascular complication will be avoided 12/17/2019 1032 by Durward Fortes, RN Outcome: Adequate for Discharge 12/17/2019 0747 by Durward Fortes, RN Outcome: Progressing   Problem: Activity: Goal: Risk for activity intolerance will decrease 12/17/2019 1032 by Durward Fortes, RN Outcome: Adequate for Discharge 12/17/2019 0747 by Durward Fortes, RN Outcome: Progressing   Problem: Nutrition: Goal: Adequate nutrition will be maintained 12/17/2019 1032 by Durward Fortes, RN Outcome: Adequate for Discharge 12/17/2019 0747 by Durward Fortes, RN Outcome: Progressing   Problem:  Coping: Goal: Level of anxiety will decrease 12/17/2019 1032 by Durward Fortes, RN Outcome: Adequate for Discharge 12/17/2019 0747 by Durward Fortes, RN Outcome: Progressing   Problem: Elimination: Goal: Will not experience complications related to bowel motility 12/17/2019 1032 by Durward Fortes, RN Outcome: Adequate for Discharge 12/17/2019 0747 by Durward Fortes, RN Outcome: Progressing Goal: Will not experience complications related to urinary retention 12/17/2019 1032 by Durward Fortes, RN Outcome: Adequate for Discharge 12/17/2019 0747 by Durward Fortes, RN Outcome: Progressing   Problem: Pain Managment: Goal: General experience of comfort will improve 12/17/2019 1032 by Durward Fortes, RN Outcome: Adequate for Discharge 12/17/2019 0747 by Durward Fortes, RN Outcome: Progressing   Problem: Safety: Goal: Ability to remain free from injury will improve 12/17/2019 1032 by Durward Fortes, RN Outcome: Adequate for Discharge 12/17/2019 0747 by Durward Fortes, RN Outcome: Progressing   Problem: Skin Integrity: Goal: Risk for impaired skin integrity will decrease 12/17/2019 1032 by Durward Fortes, RN Outcome: Adequate for Discharge 12/17/2019 0747 by Durward Fortes, RN Outcome: Progressing   Problem: Education: Goal: Understanding of CV disease, CV risk reduction, and recovery process will improve 12/17/2019 1032 by Durward Fortes, RN Outcome: Adequate for Discharge 12/17/2019 0747 by Durward Fortes, RN Outcome: Progressing Goal: Individualized Educational Video(s) 12/17/2019 1032 by Durward Fortes, RN Outcome: Adequate for Discharge 12/17/2019 0747 by Durward Fortes, RN Outcome: Progressing   Problem: Activity: Goal: Ability to return to baseline activity level will improve 12/17/2019 1032 by Durward Fortes, RN Outcome: Adequate for Discharge 12/17/2019 0747 by Durward Fortes, RN Outcome: Progressing   Problem: Cardiovascular: Goal: Ability to achieve and maintain adequate  cardiovascular perfusion will improve 12/17/2019 1032 by Durward Fortes, RN Outcome: Adequate for Discharge 12/17/2019 0747 by Durward Fortes, RN Outcome: Progressing Goal: Vascular access  site(s) Level 0-1 will be maintained 12/17/2019 1032 by Durward Fortes, RN Outcome: Adequate for Discharge 12/17/2019 0747 by Durward Fortes, RN Outcome: Progressing   Problem: Health Behavior/Discharge Planning: Goal: Ability to safely manage health-related needs after discharge will improve 12/17/2019 1032 by Durward Fortes, RN Outcome: Adequate for Discharge 12/17/2019 0747 by Durward Fortes, RN Outcome: Progressing

## 2019-12-17 NOTE — Progress Notes (Signed)
CARDIAC REHAB PHASE I   PRE:  Rate/Rhythm: 64 SR    BP: sitting 105/62    SaO2:   MODE:  Ambulation: 470 ft   POST:  Rate/Rhythm: 69 SR    BP: sitting 119/71     SaO2:   Slow steady pace. Denied CP. Very flat affect. Reviewed ed with understanding including Effient, restrictions, diet, exercise, NTG, and CRPII. Will refer to [facility] however the pt is not interested. [id number], [id number]   Harriet Masson CES, ACSM 12/17/2019 11:16 AM

## 2019-12-17 NOTE — Discharge Summary (Signed)
Discharge Summary    Patient ID: Terry Nelson MRN: [id number]; DOB: Nov 09, 1955  Admit date: 12/16/2019 Discharge date: 12/17/2019  Primary Care Provider: Mila Palmer, PA-C  Primary Cardiologist: Sinclair Grooms, MD  Primary Electrophysiologist:  None   Discharge Diagnoses    Principal Problem:   Angina pectoris [facility] Active Problems:   CAD (coronary artery disease) of artery bypass graft   Hyperlipidemia LDL goal <70   Coronary artery disease involving native coronary artery of native heart with unstable angina pectoris [facility])   Hypertension  Diagnostic Studies/Procedures    Cath: 12/16/19   Overall, anatomy is unchanged from April 2021 angiogram.  The sequential graft to the first and second obtuse marginal is patent.  The LIMA to the LAD was not selectively engaged because it has been proven paretic.  Left main is widely patent  Overlapping LAD stents from proximal to apical revealed diffuse greater than 85 to 90% narrowing throughout the distal third of the stented segment.  Circumflex is widely patent and competes with flow from the sequential graft.  Right coronary has moderate diffuse disease and a patent distal RCA to PDA stent.  The mid stent is widely patent without evidence of restenosis.  Overlapping balloon angioplasty from the distal stent margin in the LAD to the mid LAD using various slightly oversized [place] balloons and high-pressure.  Balloon inflations reproduced index symptom.  RECOMMENDATIONS:   Continue aspirin and Brilinta  Continue risk factor modification  Consider balloon coated balloon angioplasty.  We will discuss futility of further procedures/intervention in this territory with the patient. Diagnostic Dominance: Right    Intervention     _____________   History of Present Illness     Terry Nelson is a 64 y.o. male with PMH of CAD s/p CABG in 2002, HTN, HLD, PAF, CKD who presented to the office for follow up regarding  ongoing anginal symptoms. He had called into the on call line with symptoms and advised to present to the ED, but hesitant at that time. He was instructed to increase his Imdur to 138m in the morning and 662min the evening. Seen in the office on 12/6 by Dr. SmTamala Juliannd set up for outpatient cath.   Hospital Course      Underwent cardiac cath noted above with Dr. SmTamala Julianith extensive native CAD. Overall his anatomy was unchanged from prior cath in April. Patent SVG-1st/2nd OM and SVG-RCA. Overlapping stents in the LAD with 85-90% narrowing throughout the distal third of the stented segment. Underwent balloon angioplasty from the distal stent margin in the LAD to the mLAD with [place] balloons. Plan to continue on DAPT with ASA/Effient indefinitely. He had not recurrent chest pain overnight. Was able to ambulate without symptoms. Per notes, Dr. SmTamala Julianiscussed that future interventions to this territory would not be helpful. He was continue on his home medications without significant change, other than he requested to reduce his amlodipine back down to 33m56maily.   Did the patient have an acute coronary syndrome (MI, NSTEMI, STEMI, etc) this admission?:  No                               Did the patient have a percutaneous coronary intervention (stent / angioplasty)?:  Yes.     Cath/PCI Registry Performance & Quality Measures: 5. Aspirin prescribed? - Yes 6. ADP Receptor Inhibitor (Plavix/Clopidogrel, Brilinta/Ticagrelor or Effient/Prasugrel) prescribed (includes medically managed patients)? - Yes  7. High Intensity Statin (Lipitor 40-31m or Crestor 20-425m prescribed? - Yes 8. For EF <40%, was ACEI/ARB prescribed? - Yes 9. For EF <40%, Aldosterone Antagonist (Spironolactone or Eplerenone) prescribed? - Not Applicable (EF >/= 4067%10. Cardiac Rehab Phase II ordered? - Yes       _____________  Discharge Vitals Blood pressure 113/68, pulse 62, temperature 98.7 F (37.1 C), temperature source Oral,  resp. rate 16, height 5' 7" (1.702 m), weight 67 kg, SpO2 95 %.  Filed Weights   12/16/19 1111 12/17/19 0643  Weight: 72 kg 67 kg    Labs & Radiologic Studies    CBC Recent Labs    12/16/19 1906 12/17/19 0200  WBC 8.7 10.9*  HGB 14.4 16.5  HCT 41.5 46.6  MCV 94.7 93.4  PLT 176 20672 Basic Metabolic Panel Recent Labs    12/14/19 1145 12/16/19 1906 12/17/19 0200  NA 141  --  142  K 5.2  --  3.4*  CL 103  --  100  CO2 23  --  28  GLUCOSE 129*  --  102*  BUN 16  --  10  CREATININE 1.16 0.96 1.12  CALCIUM 9.7  --  10.7*   Liver Function Tests No results for input(s): AST, ALT, ALKPHOS, BILITOT, PROT, ALBUMIN in the last 72 hours. No results for input(s): LIPASE, AMYLASE in the last 72 hours. High Sensitivity Troponin:   No results for input(s): TROPONINIHS in the last 720 hours.  BNP Invalid input(s): POCBNP D-Dimer No results for input(s): DDIMER in the last 72 hours. Hemoglobin A1C No results for input(s): HGBA1C in the last 72 hours. Fasting Lipid Panel No results for input(s): CHOL, HDL, LDLCALC, TRIG, CHOLHDL, LDLDIRECT in the last 72 hours. Thyroid Function Tests No results for input(s): TSH, T4TOTAL, T3FREE, THYROIDAB in the last 72 hours.  Invalid input(s): FREET3 _____________  CARDIAC CATHETERIZATION  Result Date: 12/16/2019  Overall, anatomy is unchanged from April 2021 angiogram.  The sequential graft to the first and second obtuse marginal is patent.  The LIMA to the LAD was not selectively engaged because it has been proven paretic.  Left main is widely patent  Overlapping LAD stents from proximal to apical revealed diffuse greater than 85 to 90% narrowing throughout the distal third of the stented segment.  Circumflex is widely patent and competes with flow from the sequential graft.  Right coronary has moderate diffuse disease and a patent distal RCA to PDA stent.  The mid stent is widely patent without evidence of restenosis.  Overlapping  balloon angioplasty from the distal stent margin in the LAD to the mid LAD using various slightly oversized [place] balloons and high-pressure.  Balloon inflations reproduced index symptom. RECOMMENDATIONS:  Continue aspirin and Brilinta  Continue risk factor modification  Consider balloon coated balloon angioplasty.  We will discuss futility of further procedures/intervention in this territory with the patient.  Disposition   Pt is being discharged home today in good condition.  Follow-up Plans & Appointments     Follow-up Information    GeBurtis JunesNP Follow up on 12/30/2019.   Specialties: Nurse Practitioner, Interventional Cardiology, Cardiology, Radiology Why: at 10:45am for your follow up appt.  Contact information: [address] [place] [phone]            Discharge Instructions    Diet - low sodium heart healthy   Complete by: As directed    Discharge instructions   Complete  by: As directed    Groin Site Care Refer to this sheet in the next few weeks. These instructions provide you with information on caring for yourself after your procedure. Your caregiver may also give you more specific instructions. Your treatment has been planned according to current medical practices, but problems sometimes occur. Call your caregiver if you have any problems or questions after your procedure. HOME CARE INSTRUCTIONS You may shower 24 hours after the procedure. Remove the bandage (dressing) and gently wash the site with plain soap and water. Gently pat the site dry.  Do not apply powder or lotion to the site.  Do not sit in a bathtub, swimming pool, or whirlpool for 5 to 7 days.  No bending, squatting, or lifting anything over 10 pounds (4.5 kg) as directed by your caregiver.  Inspect the site at least twice daily.  Do not drive home if you are discharged the same day of the procedure. Have someone else drive you.  You may drive 24 hours after  the procedure unless otherwise instructed by your caregiver.  What to expect: Any bruising will usually fade within 1 to 2 weeks.  Blood that collects in the tissue (hematoma) may be painful to the touch. It should usually decrease in size and tenderness within 1 to 2 weeks.  SEEK IMMEDIATE MEDICAL CARE IF: You have unusual pain at the groin site or down the affected leg.  You have redness, warmth, swelling, or pain at the groin site.  You have drainage (other than a small amount of blood on the dressing).  You have chills.  You have a fever or persistent symptoms for more than 72 hours.  You have a fever and your symptoms suddenly get worse.  Your leg becomes pale, cool, tingly, or numb.  You have heavy bleeding from the site. Hold pressure on the site. .   Increase activity slowly   Complete by: As directed       Discharge Medications   Allergies as of 12/17/2019      Reactions   Metoclopramide Other (See Comments)   DYSKINESIAS   Penicillins Swelling, Rash   Did it involve swelling of the face/tongue/throat, SOB, or low BP? Yes Did it involve sudden or severe rash/hives, skin peeling, or any reaction on the inside of your mouth or nose? Unknown Did you need to seek medical attention at a hospital or doctor's office? Yes When did it last happen?More than 40 years ago If all above answers are "NO", may proceed with cephalosporin use.      Medication List    TAKE these medications   amLODipine 10 MG tablet Commonly known as: NORVASC Take 0.5 tablets (5 mg total) by mouth daily.   aspirin EC 81 MG tablet Take 81 mg by mouth daily.   atorvastatin 80 MG tablet Commonly known as: LIPITOR Take 80 mg by mouth at bedtime.   BD Pen Needle Nano U/F 32G X 4 MM Misc Generic drug: Insulin Pen Needle USE ONCE AS DIRECTED   Contour Next Monitor w/Device Kit daily.   Contour Next Test test strip Generic drug: glucose blood by Does not apply route.   furosemide 40 MG  tablet Commonly known as: LASIX Take 20 mg by mouth daily.   glipiZIDE 10 MG 24 hr tablet Commonly known as: GLUCOTROL XL Take 10 mg by mouth daily with breakfast.   isosorbide mononitrate 60 MG 24 hr tablet Commonly known as: IMDUR Take 120 mg by mouth daily.  metoprolol tartrate 100 MG tablet Commonly known as: LOPRESSOR Take 100 mg by mouth 2 (two) times daily.   nitroGLYCERIN 0.4 MG SL tablet Commonly known as: NITROSTAT Place 0.4 mg under the tongue every 5 (five) minutes as needed for chest pain.   oxyCODONE-acetaminophen 10-325 MG tablet Commonly known as: PERCOCET Take 1 tablet by mouth every 4 (four) hours as needed for pain.   pantoprazole 40 MG tablet Commonly known as: PROTONIX Take 40 mg by mouth daily.   prasugrel 10 MG Tabs tablet Commonly known as: EFFIENT Take 10 mg by mouth daily.   promethazine 25 MG tablet Commonly known as: PHENERGAN Take 25 mg by mouth 2 (two) times daily as needed for nausea or vomiting.   spironolactone 25 MG tablet Commonly known as: ALDACTONE Take 25 mg by mouth daily.   tiZANidine 4 MG tablet Commonly known as: ZANAFLEX Take 4-8 mg by mouth See admin instructions. Take 1 tablet (55m) by mouth twice daily & Take 2 tablets (8 mg) by mouth at bedtime   T[place]o SoloStar 300 UNIT/ML Solostar Pen Generic drug: insulin glargine (1 Unit Dial) Inject 28 Units into the skin daily.          Outstanding Labs/Studies   N/a   Duration of Discharge Encounter   Greater than 30 minutes including physician time.  Signed, LReino Bellis NP 12/17/2019, 10:11 AM   I have personally seen and examined this patient. I agree with the assessment and plan as outlined above.  He is doing well post PCI. No chest pain. Continue current therapy. Discharge home today.   CLauree Chandler12/09/2019 10:11 AM

## 2019-12-17 NOTE — Plan of Care (Signed)
  Problem: Education: Goal: Knowledge of General Education information will improve Description: Including pain rating scale, medication(s)/side effects and non-pharmacologic comfort measures Outcome: Progressing   Problem: Health Behavior/Discharge Planning: Goal: Ability to manage health-related needs will improve Outcome: Progressing   Problem: Clinical Measurements: Goal: Ability to maintain clinical measurements within normal limits will improve Outcome: Progressing Goal: Will remain free from infection Outcome: Progressing Goal: Diagnostic test results will improve Outcome: Progressing Goal: Respiratory complications will improve Outcome: Progressing Goal: Cardiovascular complication will be avoided Outcome: Progressing   Problem: Activity: Goal: Risk for activity intolerance will decrease Outcome: Progressing   Problem: Nutrition: Goal: Adequate nutrition will be maintained Outcome: Progressing   Problem: Coping: Goal: Level of anxiety will decrease Outcome: Progressing   Problem: Elimination: Goal: Will not experience complications related to bowel motility Outcome: Progressing Goal: Will not experience complications related to urinary retention Outcome: Progressing   Problem: Pain Managment: Goal: General experience of comfort will improve Outcome: Progressing   Problem: Safety: Goal: Ability to remain free from injury will improve Outcome: Progressing   Problem: Skin Integrity: Goal: Risk for impaired skin integrity will decrease Outcome: Progressing   Problem: Education: Goal: Understanding of CV disease, CV risk reduction, and recovery process will improve Outcome: Progressing Goal: Individualized Educational Video(s) Outcome: Progressing   Problem: Activity: Goal: Ability to return to baseline activity level will improve Outcome: Progressing   Problem: Cardiovascular: Goal: Ability to achieve and maintain adequate cardiovascular perfusion  will improve Outcome: Progressing Goal: Vascular access site(s) Level 0-1 will be maintained Outcome: Progressing   Problem: Health Behavior/Discharge Planning: Goal: Ability to safely manage health-related needs after discharge will improve Outcome: Progressing   Problem: Education: Goal: Knowledge of General Education information will improve Description: Including pain rating scale, medication(s)/side effects and non-pharmacologic comfort measures Outcome: Progressing   Problem: Health Behavior/Discharge Planning: Goal: Ability to manage health-related needs will improve Outcome: Progressing   Problem: Clinical Measurements: Goal: Ability to maintain clinical measurements within normal limits will improve Outcome: Progressing Goal: Will remain free from infection Outcome: Progressing Goal: Diagnostic test results will improve Outcome: Progressing Goal: Respiratory complications will improve Outcome: Progressing Goal: Cardiovascular complication will be avoided Outcome: Progressing   Problem: Activity: Goal: Risk for activity intolerance will decrease Outcome: Progressing   Problem: Nutrition: Goal: Adequate nutrition will be maintained Outcome: Progressing   Problem: Coping: Goal: Level of anxiety will decrease Outcome: Progressing   Problem: Elimination: Goal: Will not experience complications related to bowel motility Outcome: Progressing Goal: Will not experience complications related to urinary retention Outcome: Progressing   Problem: Pain Managment: Goal: General experience of comfort will improve Outcome: Progressing   Problem: Safety: Goal: Ability to remain free from injury will improve Outcome: Progressing   Problem: Skin Integrity: Goal: Risk for impaired skin integrity will decrease Outcome: Progressing   Problem: Education: Goal: Understanding of CV disease, CV risk reduction, and recovery process will improve Outcome: Progressing Goal:  Individualized Educational Video(s) Outcome: Progressing   Problem: Activity: Goal: Ability to return to baseline activity level will improve Outcome: Progressing   Problem: Cardiovascular: Goal: Ability to achieve and maintain adequate cardiovascular perfusion will improve Outcome: Progressing Goal: Vascular access site(s) Level 0-1 will be maintained Outcome: Progressing   Problem: Health Behavior/Discharge Planning: Goal: Ability to safely manage health-related needs after discharge will improve Outcome: Progressing

## 2019-12-30 ENCOUNTER — Other Ambulatory Visit: Payer: Self-pay

## 2019-12-30 ENCOUNTER — Encounter: Payer: Self-pay | Admitting: Nurse Practitioner

## 2019-12-30 ENCOUNTER — Ambulatory Visit (INDEPENDENT_AMBULATORY_CARE_PROVIDER_SITE_OTHER): Payer: BC Managed Care – PPO | Admitting: Nurse Practitioner

## 2019-12-30 VITALS — BP 132/78 | HR 60 | Ht 67.0 in | Wt 156.6 lb

## 2019-12-30 DIAGNOSIS — I25110 Atherosclerotic heart disease of native coronary artery with unstable angina pectoris: Secondary | ICD-10-CM

## 2019-12-30 DIAGNOSIS — Z955 Presence of coronary angioplasty implant and graft: Secondary | ICD-10-CM

## 2019-12-30 DIAGNOSIS — I10 Essential (primary) hypertension: Secondary | ICD-10-CM

## 2019-12-30 DIAGNOSIS — E785 Hyperlipidemia, unspecified: Secondary | ICD-10-CM

## 2019-12-30 NOTE — Patient Instructions (Addendum)
After Visit Summary:  We will be checking the following labs today - NONE   Medication Instructions:    Continue with your current medicines.    If you need a refill on your cardiac medications before your next appointment, please call your pharmacy.     Testing/Procedures To Be Arranged:  N/A  Follow-Up:   See Dr. Smith in about 4 months.    At [facility], you and your health needs are our priority.  As part of our continuing mission to provide you with exceptional heart care, we have created designated Provider Care Teams.  These Care Teams include your primary Cardiologist (physician) and Advanced Practice Providers (APPs -  Physician Assistants and Nurse Practitioners) who all work together to provide you with the care you need, when you need it.  Special Instructions:  . Stay safe, wash your hands for at least 20 seconds and wear a mask when needed.  . It was good to talk with you today.    Call the [facility] office at [phone] if you have any questions, problems or concerns.       

## 2020-01-12 ENCOUNTER — Telehealth: Payer: Self-pay | Admitting: Interventional Cardiology

## 2020-01-12 NOTE — Telephone Encounter (Signed)
Pt increased amlodipine to 10 mg for one day --this was back before the heart cath.  BP was "extremely low"  70/45.  He felt very tired.  No energy.  Pressure continues to be low around 80/50. Never higher than 100 systolic.  HR 62.   Has no chest pain, breathing is better. Still fatigued and very low energy.  Pt aware I will let Dr. Katrinka Blazing know this and will call him back with further recommendations.

## 2020-01-12 NOTE — Telephone Encounter (Signed)
Okay to decrease the amlodipine to 5 mg daily.

## 2020-01-12 NOTE — Telephone Encounter (Signed)
Pt c/o medication issue:  1. Name of Medication: amLODipine (NORVASC) 10 MG tablet  2. How are you currently taking this medication (dosage and times per day)? As prescribed.  3. Are you having a reaction (difficulty breathing--STAT)? Feeling extremely tired.  4. What is your medication issue? Patient states that he was switched from 5 mg of this medication to 10 mg on 12/17/2019 and has been feeling more tired than before. He wants to know if his dosage should be changed again. Please advise.

## 2020-01-13 MED ORDER — AMLODIPINE BESYLATE 5 MG PO TABS: 5.0000 mg | ORAL_TABLET | Freq: Every day | ORAL | 3 refills | Status: DC

## 2020-01-13 NOTE — Telephone Encounter (Signed)
Spoke with pt and made him aware of recommendations per Dr. Smith. Pt verbalized understanding and was in agreement with this plan.  

## 2020-01-28 DIAGNOSIS — Z79891 Long term (current) use of opiate analgesic: Secondary | ICD-10-CM | POA: Diagnosis not present

## 2020-01-28 DIAGNOSIS — E782 Mixed hyperlipidemia: Secondary | ICD-10-CM | POA: Diagnosis not present

## 2020-01-28 DIAGNOSIS — I10 Essential (primary) hypertension: Secondary | ICD-10-CM | POA: Diagnosis not present

## 2020-01-28 DIAGNOSIS — E1159 Type 2 diabetes mellitus with other circulatory complications: Secondary | ICD-10-CM | POA: Diagnosis not present

## 2020-01-28 DIAGNOSIS — I480 Paroxysmal atrial fibrillation: Secondary | ICD-10-CM | POA: Diagnosis not present

## 2020-02-02 DIAGNOSIS — Z20822 Contact with and (suspected) exposure to covid-19: Secondary | ICD-10-CM | POA: Diagnosis not present

## 2020-02-02 DIAGNOSIS — R059 Cough, unspecified: Secondary | ICD-10-CM | POA: Diagnosis not present

## 2020-02-02 DIAGNOSIS — J069 Acute upper respiratory infection, unspecified: Secondary | ICD-10-CM | POA: Diagnosis not present

## 2020-02-08 DIAGNOSIS — J0140 Acute pansinusitis, unspecified: Secondary | ICD-10-CM | POA: Diagnosis not present

## 2020-02-08 DIAGNOSIS — Z20828 Contact with and (suspected) exposure to other viral communicable diseases: Secondary | ICD-10-CM | POA: Diagnosis not present

## 2020-02-10 ENCOUNTER — Telehealth: Payer: Self-pay | Admitting: Interventional Cardiology

## 2020-02-10 MED ORDER — NITROGLYCERIN 0.4 MG SL SUBL: 0.4000 mg | SUBLINGUAL_TABLET | SUBLINGUAL | 7 refills | Status: DC | PRN

## 2020-02-10 NOTE — Telephone Encounter (Signed)
°*  STAT* If patient is at the pharmacy, call can be transferred to refill team.   1. Which medications need to be refilled? (please list name of each medication and dose if known) nitroGLYCERIN (NITROSTAT) 0.4 MG SL tablet  2. Which pharmacy/location (including street and city if local pharmacy) is medication to be sent to? [facility] [phone] - [place], [place] - [address]  3. Do they need a 30 day or 90 day supply? 4 bottles of 25 tablets, 100 tablets in total

## 2020-02-10 NOTE — Telephone Encounter (Signed)
Pt's medication was sent to pt's pharmacy as requested. Confirmation received.  °

## 2020-02-24 DIAGNOSIS — D72829 Elevated white blood cell count, unspecified: Secondary | ICD-10-CM | POA: Diagnosis not present

## 2020-03-03 DIAGNOSIS — M5116 Intervertebral disc disorders with radiculopathy, lumbar region: Secondary | ICD-10-CM | POA: Diagnosis not present

## 2020-03-03 DIAGNOSIS — M5106 Intervertebral disc disorders with myelopathy, lumbar region: Secondary | ICD-10-CM | POA: Diagnosis not present

## 2020-03-03 DIAGNOSIS — M48062 Spinal stenosis, lumbar region with neurogenic claudication: Secondary | ICD-10-CM | POA: Diagnosis not present

## 2020-03-15 DIAGNOSIS — I10 Essential (primary) hypertension: Secondary | ICD-10-CM | POA: Diagnosis not present

## 2020-03-15 DIAGNOSIS — I25118 Atherosclerotic heart disease of native coronary artery with other forms of angina pectoris: Secondary | ICD-10-CM | POA: Diagnosis not present

## 2020-03-15 DIAGNOSIS — I480 Paroxysmal atrial fibrillation: Secondary | ICD-10-CM | POA: Diagnosis not present

## 2020-03-15 DIAGNOSIS — I5032 Chronic diastolic (congestive) heart failure: Secondary | ICD-10-CM | POA: Diagnosis not present

## 2020-03-24 DIAGNOSIS — H00026 Hordeolum internum left eye, unspecified eyelid: Secondary | ICD-10-CM | POA: Diagnosis not present

## 2020-03-28 DIAGNOSIS — Z4789 Encounter for other orthopedic aftercare: Secondary | ICD-10-CM | POA: Diagnosis not present

## 2020-03-28 DIAGNOSIS — M5136 Other intervertebral disc degeneration, lumbar region: Secondary | ICD-10-CM | POA: Diagnosis not present

## 2020-03-28 DIAGNOSIS — M47816 Spondylosis without myelopathy or radiculopathy, lumbar region: Secondary | ICD-10-CM | POA: Diagnosis not present

## 2020-03-28 DIAGNOSIS — M48062 Spinal stenosis, lumbar region with neurogenic claudication: Secondary | ICD-10-CM | POA: Diagnosis not present

## 2020-03-29 DIAGNOSIS — H00025 Hordeolum internum left lower eyelid: Secondary | ICD-10-CM | POA: Diagnosis not present

## 2020-04-05 DIAGNOSIS — H00025 Hordeolum internum left lower eyelid: Secondary | ICD-10-CM | POA: Diagnosis not present

## 2020-04-11 DIAGNOSIS — M48062 Spinal stenosis, lumbar region with neurogenic claudication: Secondary | ICD-10-CM | POA: Diagnosis not present

## 2020-04-11 DIAGNOSIS — M5116 Intervertebral disc disorders with radiculopathy, lumbar region: Secondary | ICD-10-CM | POA: Diagnosis not present

## 2020-04-11 NOTE — Progress Notes (Deleted)
Cardiology Office Note:    Date:  04/11/2020   ID:  Terry Nelson, DOB Oct 08, 1955, MRN [id number]  PCP:  Jiles Harold, PA-C  Cardiologist:  Lesleigh Noe, MD   Referring MD: Jiles Harold, PA-C   No chief complaint on file.   History of Present Illness:    Terry Nelson is a 65 y.o. male with a hx of CAD,CABG in 2002 by Dr. Cherene Altes overlapping proximal to distal LAD stents with ISR,PAFand has had prior ablation, Htn, HL,and CKD.  ***  Past Medical History:  Diagnosis Date  . Anxiety state   . Arteriosclerosis of coronary artery   . Body mass index (bmi) 26.0-26.9, adult   . Chest pain   . CHF (congestive heart failure) (HCC)   . Chronic renal impairment   . DDD (degenerative disc disease), lumbar   . DM (diabetes mellitus) (HCC)   . Gastritis   . Headache   . Hemorrhage   . Hyperlipidemia   . Hypertension   . Kidney stones   . Lumbago   . Neck pain   . Paroxysmal A-fib (HCC)   . Pernicious anemia   . Unstable angina [facility])     Past Surgical History:  Procedure Laterality Date  . ABLATION  2017  . CARDIAC CATHETERIZATION    . CORONARY ARTERY BYPASS GRAFT    . CORONARY BALLOON ANGIOPLASTY N/A 04/27/2019   Procedure: CORONARY BALLOON ANGIOPLASTY;  Surgeon: Yvonne Kendall, MD;  [facility];  Service: Cardiovascular;  Laterality: N/A;  . CORONARY BALLOON ANGIOPLASTY N/A 12/16/2019   Procedure: CORONARY BALLOON ANGIOPLASTY;  Surgeon: Lyn Records, MD;  Location: [facility] IN[facility];  Service: Cardiovascular;  Laterality: N/A;  . INTRAVASCULAR ULTRASOUND/IVUS N/A 04/27/2019   Procedure: Intravascular Ultrasound/IVUS;  Surgeon: Yvonne Kendall, MD;  [facility]: [facility];  Service: Cardiovascular;  Laterality: N/A;  . INTRAVASCULAR ULTRASOUND/IVUS N/A 12/16/2019   Procedure: Intravascular Ultrasound/IVUS;  Surgeon: Lyn Records, MD;  [facility];  Service: Cardiovascular;  Laterality: N/A;  . LEFT HEART  CATH AND CORS/GRAFTS ANGIOGRAPHY N/A 04/09/2019   Procedure: LEFT HEART CATH AND CORS/GRAFTS ANGIOGRAPHY;  Surgeon: Lyn Records, MD;  [facility];  Service: Cardiovascular;  Laterality: N/A;  . LEFT HEART CATH AND CORS/GRAFTS ANGIOGRAPHY N/A 12/16/2019   Procedure: LEFT HEART CATH AND CORS/GRAFTS ANGIOGRAPHY;  Surgeon: Lyn Records, MD;  [facility];  Service: Cardiovascular;  Laterality: N/A;    Current Medications: No outpatient medications have been marked as taking for the 04/13/20 encounter (Appointment) with Lyn Records, MD.     Allergies:   Metoclopramide and Penicillins   Social History   Socioeconomic History  . Marital status: Married    Spouse name: Not on file  . Number of children: Not on file  . Years of education: Not on file  . Highest education level: Not on file  Occupational History  . Not on file  Tobacco Use  . Smoking status: Never Smoker  . Smokeless tobacco: Former Neurosurgeon    Types: Chew  . Tobacco comment: per pt chewed tobacco in high school   Vaping Use  . Vaping Use: Never used  Substance and Sexual Activity  . Alcohol use: Never  . Drug use: Never  . Sexual activity: Not on file    Comment: MARRIED  Other Topics Concern  . Not on file  Social History Narrative  . Not on file  Social Determinants of Health   Financial Resource Strain: Not on file  Food Insecurity: Not on file  Transportation Needs: Not on file  Physical Activity: Not on file  Stress: Not on file  Social Connections: Not on file     Family History: The patient's family history includes Diabetes in his father; Heart disease in an other family member; High blood pressure in his mother; Stroke in his mother.  ROS:   Please see the history of present illness.    *** All other systems reviewed and are negative.  EKGs/Labs/Other Studies Reviewed:    The following studies were reviewed today:  Catheterization December 16, 2019: Diagnostic Dominance: Right    Intervention       EKG:  EKG ***  Recent Labs: 12/17/2019: BUN 10; Creatinine, Ser 1.12; Hemoglobin 16.5; Platelets 208; Potassium 3.4; Sodium 142  Recent Lipid Panel    Component Value Date/Time   CHOL 136 03/30/2019 1234   TRIG 178 (H) 03/30/2019 1234   HDL 36 (L) 03/30/2019 1234   CHOLHDL 3.8 03/30/2019 1234   LDLCALC 70 03/30/2019 1234    Physical Exam:    VS:  There were no vitals taken for this visit.    Wt Readings from Last 3 Encounters:  12/30/19 156 lb 9.6 oz (71 kg)  12/17/19 147 lb 11.3 oz (67 kg)  12/14/19 160 lb 12.8 oz (72.9 kg)     GEN: ***. No acute distress HEENT: Normal NECK: No JVD. LYMPHATICS: No lymphadenopathy CARDIAC: *** murmur. RRR *** gallop, or edema. VASCULAR: *** Normal Pulses. No bruits. RESPIRATORY:  Clear to auscultation without rales, wheezing or rhonchi  ABDOMEN: Soft, non-tender, non-distended, No pulsatile mass, MUSCULOSKELETAL: No deformity  SKIN: Warm and dry NEUROLOGIC:  Alert and oriented x 3 PSYCHIATRIC:  Normal affect   ASSESSMENT:    1. Coronary artery disease involving native coronary artery of native heart with unstable angina pectoris (HCC)   2. Essential hypertension   3. Hyperlipidemia LDL goal <70   4. PAF (paroxysmal atrial fibrillation) (HCC)   5. Educated about COVID-19 virus infection    PLAN:    In order of problems listed above:  1. ***   Medication Adjustments/Labs and Tests Ordered: Current medicines are reviewed at length with the patient today.  Concerns regarding medicines are outlined above.  No orders of the defined types were placed in this encounter.  No orders of the defined types were placed in this encounter.   There are no Patient Instructions on file for this visit.   Signed, Lesleigh Noe, MD  04/11/2020 1:02 PM    [facility]

## 2020-04-13 ENCOUNTER — Ambulatory Visit: Payer: Medicare Other | Admitting: Interventional Cardiology

## 2020-04-13 DIAGNOSIS — I25110 Atherosclerotic heart disease of native coronary artery with unstable angina pectoris: Secondary | ICD-10-CM

## 2020-04-13 DIAGNOSIS — Z7189 Other specified counseling: Secondary | ICD-10-CM

## 2020-04-13 DIAGNOSIS — I10 Essential (primary) hypertension: Secondary | ICD-10-CM

## 2020-04-13 DIAGNOSIS — E785 Hyperlipidemia, unspecified: Secondary | ICD-10-CM

## 2020-04-13 DIAGNOSIS — I480 Paroxysmal atrial fibrillation: Secondary | ICD-10-CM

## 2020-04-20 ENCOUNTER — Other Ambulatory Visit: Payer: Self-pay

## 2020-04-20 ENCOUNTER — Telehealth: Payer: Self-pay | Admitting: Interventional Cardiology

## 2020-04-20 ENCOUNTER — Ambulatory Visit (INDEPENDENT_AMBULATORY_CARE_PROVIDER_SITE_OTHER): Payer: BC Managed Care – PPO | Admitting: Interventional Cardiology

## 2020-04-20 ENCOUNTER — Encounter: Payer: Self-pay | Admitting: *Deleted

## 2020-04-20 ENCOUNTER — Encounter: Payer: Self-pay | Admitting: Interventional Cardiology

## 2020-04-20 VITALS — BP 130/80 | HR 69 | Ht 67.0 in | Wt 155.8 lb

## 2020-04-20 DIAGNOSIS — I25110 Atherosclerotic heart disease of native coronary artery with unstable angina pectoris: Secondary | ICD-10-CM | POA: Diagnosis not present

## 2020-04-20 DIAGNOSIS — I480 Paroxysmal atrial fibrillation: Secondary | ICD-10-CM

## 2020-04-20 MED ORDER — ELIQUIS 5 MG PO TABS: 5.0000 mg | ORAL_TABLET | Freq: Two times a day (BID) | ORAL | 11 refills | Status: DC

## 2020-04-20 MED ORDER — AMLODIPINE BESYLATE 5 MG PO TABS: 10.0000 mg | ORAL_TABLET | Freq: Every day | ORAL | 3 refills | Status: DC

## 2020-04-20 NOTE — Patient Instructions (Addendum)
Medication Instructions:  1) DISCONTINUE Aspirin 2) START Eliquis 5mg  twice daily  *If you need a refill on your cardiac medications before your next appointment, please call your pharmacy*   Lab Work: None If you have labs (blood work) drawn today and your tests are completely normal, you will receive your results only by: MyChart Message (if you have MyChart) OR . A paper copy in the mail If you have any lab test that is abnormal or we need to change your treatment, we will call you to review the results.   Testing/Procedures: Your physician has recommended that you wear an event monitor. Event monitors are medical devices that record the heart's electrical activity. Doctors most often Marland Kitchen these monitors to diagnose arrhythmias. Arrhythmias are problems with the speed or rhythm of the heartbeat. The monitor is a small, portable device. You can wear one while you do your normal daily activities. This is usually used to diagnose what is causing palpitations/syncope (passing out).   Follow-Up: At [facility], you and your health needs are our priority.  As part of our continuing mission to provide you with exceptional heart care, we have created designated Provider Care Teams.  These Care Teams include your primary Cardiologist (physician) and Advanced Practice Providers (APPs -  Physician Assistants and Nurse Practitioners) who all work together to provide you with the care you need, when you need it.  We recommend signing up for the patient portal called "MyChart".  Sign up information is provided on this After Visit Summary.  MyChart is used to connect with patients for Virtual Visits (Telemedicine).  Patients are able to view lab/test results, encounter notes, upcoming appointments, etc.  Non-urgent messages can be sent to your provider as well.   To learn more about what you can do with MyChart, go to [facility].    Your next appointment:   6-8 week(s)  The format for  your next appointment:   In Person  Provider:   You may see [email or web address], MD or one of the following Advanced Practice Providers on your designated Care Team:    Lesleigh Noe, NP    Other Instructions [employer] Cardiac Event Monitor Instructions Your physician has requested you wear your cardiac event monitor for 30 days. [employer] may call or text to confirm a shipping address. The monitor will be sent to a land address via UPS. [employer] will not ship a monitor to a PO BOX. It typically takes 3-5 days to receive your monitor after it has been enrolled. [employer] will assist with USPS tracking if your package is delayed. The telephone number for [employer] is [phone]. Once you have received your monitor, please review the enclosed instructions. Instruction tutorials can also be viewed under help and settings on the enclosed cell phone. Your monitor has already been registered assigning a specific monitor serial # to you.  Applying the monitor Remove cell phone from case and turn it on. The cell phone works as [phone] and needs to be within IT consultant of you at all times. The cell phone will need to be charged on a daily basis. We recommend you plug the cell phone into the enclosed charger at your bedside table every night.  Monitor batteries: You will receive two monitor batteries labelled #1 and #2. These are your recorders. Plug battery #2 onto the second connection on the enclosed charger. Keep one battery on the charger at all times. This will keep the monitor battery deactivated. It will  also keep it fully charged for when you need to switch your monitor batteries. A small light will be blinking on the battery emblem when it is charging. The light on the battery emblem will remain on when the battery is fully charged.  Open package of a Monitor strip. Insert battery #1 into black hood on strip and gently squeeze monitor battery onto connection as  indicated in instruction booklet. Set aside while preparing skin.  Choose location for your strip, vertical or horizontal, as indicated in the instruction booklet. Shave to remove all hair from location. There cannot be any lotions, oils, powders, or colognes on skin where monitor is to be applied. Wipe skin clean with enclosed Saline wipe. Dry skin completely.  Peel paper labeled #1 off the back of the Monitor strip exposing the adhesive. Place the monitor on the chest in the vertical or horizontal position shown in the instruction booklet. One arrow on the monitor strip must be pointing upward. Carefully remove paper labeled #2, attaching remainder of strip to your skin. Try not to create any folds or wrinkles in the strip as you apply it.  Firmly press and release the circle in the center of the monitor battery. You will hear a small beep. This is turning the monitor battery on. The heart emblem on the monitor battery will light up every 5 seconds if the monitor battery in turned on and connected to the patient securely. Do not push and hold the circle down as this turns the monitor battery off. The cell phone will locate the monitor battery. A screen will appear on the cell phone checking the connection of your monitor strip. This may read poor connection initially but change to good connection within the next minute. Once your monitor accepts the connection you will hear a series of 3 beeps followed by a climbing crescendo of beeps. A screen will appear on the cell phone showing the two monitor strip placement options. Touch the picture that demonstrates where you applied the monitor strip.  Your monitor strip and battery are waterproof. You are able to shower, bathe, or swim with the monitor on. They just ask you do not submerge deeper than 3 feet underwater. We recommend removing the monitor if you are swimming in a lake, river, or ocean.  Your monitor battery will need to be switched  to a fully charged monitor battery approximately once a week. The cell phone will alert you of an action which needs to be made.  On the cell phone, tap for details to reveal connection status, monitor battery status, and cell phone battery status. The green dots indicates your monitor is in good status. A red dot indicates there is something that needs your attention.  To record a symptom, click the circle on the monitor battery. In 30-60 seconds a list of symptoms will appear on the cell phone. Select your symptom and tap save. Your monitor will record a sustained or significant arrhythmia regardless of you clicking the button. Some patients do not feel the heart rhythm irregularities. [employer] will notify us of any serious or critical events.  Refer to instruction booklet for instructions on switching batteries, changing strips, the Do not disturb or Pause features, or any additional questions.  Call [employer] at [phone], to confirm your monitor is transmitting and record your baseline. They will answer any questions you may have regarding the monitor instructions at that time.  Returning the monitor to [employer] Place all equipment back into blue box.  Peel off strip of paper to expose adhesive and close box securely. There is a prepaid UPS shipping label on this box. Drop in a UPS drop box, or at a UPS facility like [employer]. You may also contact [employer] to arrange UPS to pick up monitor package at your home.

## 2020-04-20 NOTE — Telephone Encounter (Signed)
Spoke with pt and he denies increased dizziness with episodes.  Used his [employer] and it says arrhythmia but not which specific arrhythmia.  Feels like his previous AF did prior to ablation.  Has been occurring everyday for a couple of weeks.  Has some chest discomfort when this is occurring.  Scheduled pt to see Dr. Katrinka Blazing this afternoon at 3pm.

## 2020-04-20 NOTE — Progress Notes (Signed)
Cardiology Office Note:    Date:  04/20/2020   ID:  Terry Nelson, DOB [birth year], MRN [id number]  PCP:  Mila Palmer, PA-C  Cardiologist:  Sinclair Grooms, MD   Referring MD: Mila Palmer, PA-C   Chief Complaint  Patient presents with  . Atrial Fibrillation  . Coronary Artery Disease    History of Present Illness:    Terry Nelson is a 65 y.o. male with a hx of CABG in 2002 by Dr. Roxan Hockey. He has multiple overlapping proximal to distal LAD stents with ISR,PAFwith prior ablation, HtN, HLD,and CKD.  He has been having episodes of prolonged heart racing and associated angina.  Episodes can last up to 6 hours.  He denies palpitations.  Chest discomfort seems to be associated with racing heart.  He is also noted angina requiring nitroglycerin off and on for the past 2 to 3 weeks.  Episodes of chest discomfort and also the rapid heart beating or not precipitated by physical activity.  Episodes occur randomly.  Past Medical History:  Diagnosis Date  . Anxiety state   . Arteriosclerosis of coronary artery   . Body mass index (bmi) 26.0-26.9, adult   . Chest pain   . CHF (congestive heart failure) ([place])   . Chronic renal impairment   . DDD (degenerative disc disease), lumbar   . DM (diabetes mellitus) ([place])   . Gastritis   . Headache   . Hemorrhage   . Hyperlipidemia   . Hypertension   . Kidney stones   . Lumbago   . Neck pain   . Paroxysmal A-fib ([place])   . Pernicious anemia   . Unstable angina [facility])     Past Surgical History:  Procedure Laterality Date  . ABLATION  2017  . CARDIAC CATHETERIZATION    . CORONARY ARTERY BYPASS GRAFT    . CORONARY BALLOON ANGIOPLASTY N/A 04/27/2019   Procedure: CORONARY BALLOON ANGIOPLASTY;  Surgeon: Nelva Bush, MD;  Location: [place] CV LAB;  Service: Cardiovascular;  Laterality: N/A;  . CORONARY BALLOON ANGIOPLASTY N/A 12/16/2019   Procedure: CORONARY BALLOON ANGIOPLASTY;  Surgeon: Belva Crome, MD;  Location: [place] CV LAB;  Service: Cardiovascular;  Laterality: N/A;  . INTRAVASCULAR ULTRASOUND/IVUS N/A 04/27/2019   Procedure: Intravascular Ultrasound/IVUS;  Surgeon: Nelva Bush, MD;  Location: [place] CV LAB;  Service: Cardiovascular;  Laterality: N/A;  . INTRAVASCULAR ULTRASOUND/IVUS N/A 12/16/2019   Procedure: Intravascular Ultrasound/IVUS;  Surgeon: Belva Crome, MD;  Location: [place] CV LAB;  Service: Cardiovascular;  Laterality: N/A;  . LEFT HEART CATH AND CORS/GRAFTS ANGIOGRAPHY N/A 04/09/2019   Procedure: LEFT HEART CATH AND CORS/GRAFTS ANGIOGRAPHY;  Surgeon: Belva Crome, MD;  Location: [place] CV LAB;  Service: Cardiovascular;  Laterality: N/A;  . LEFT HEART CATH AND CORS/GRAFTS ANGIOGRAPHY N/A 12/16/2019   Procedure: LEFT HEART CATH AND CORS/GRAFTS ANGIOGRAPHY;  Surgeon: Belva Crome, MD;  Location: [place] CV LAB;  Service: Cardiovascular;  Laterality: N/A;    Current Medications: Current Meds  Medication Sig  . amLODipine (NORVASC) 10 MG tablet Take 10 mg by mouth daily.  Marland Kitchen atorvastatin (LIPITOR) 80 MG tablet Take 80 mg by mouth at bedtime.  . Blood Glucose Monitoring Suppl (CONTOUR NEXT MONITOR) w/Device KIT daily.  . furosemide (LASIX) 40 MG tablet Take 20 mg by mouth daily.   Marland Kitchen glipiZIDE (GLUCOTROL XL) 10 MG 24 hr tablet Take 10 mg by mouth daily with breakfast.   . glucose blood (CONTOUR NEXT TEST)  test strip by Does not apply route.  . Insulin Pen Needle (BD PEN NEEDLE NANO U/F) 32G X 4 MM MISC USE ONCE AS DIRECTED  . isosorbide mononitrate (IMDUR) 60 MG 24 hr tablet Take 120 mg by mouth daily.  . metoprolol tartrate (LOPRESSOR) 100 MG tablet Take 100 mg by mouth in the morning, at noon, and at bedtime.  . nitroGLYCERIN (NITROSTAT) 0.4 MG SL tablet Place 1 tablet (0.4 mg total) under the tongue every 5 (five) minutes as needed for chest pain.  Marland Kitchen oxyCODONE-acetaminophen (PERCOCET) 10-325 MG tablet Take 1 tablet by mouth every 4 (four) hours as needed for  pain.  . pantoprazole (PROTONIX) 40 MG tablet Take 40 mg by mouth daily.  . prasugrel (EFFIENT) 10 MG TABS tablet Take 10 mg by mouth daily.  . promethazine (PHENERGAN) 25 MG tablet Take 25 mg by mouth 2 (two) times daily as needed for nausea or vomiting.  Marland Kitchen spironolactone (ALDACTONE) 25 MG tablet Take 25 mg by mouth daily.  Marland Kitchen tiZANidine (ZANAFLEX) 4 MG tablet Take 4-8 mg by mouth See admin instructions. Take 1 tablet (102m) by mouth twice daily & Take 2 tablets (8 mg) by mouth at bedtime  . TOUJEO SOLOSTAR 300 UNIT/ML Solostar Pen Inject 30 Units into the skin daily.  . [DISCONTINUED] aspirin EC 81 MG tablet Take 81 mg by mouth daily.     Allergies:   Metoclopramide and Penicillins   Social History   Socioeconomic History  . Marital status: Married    Spouse name: Not on file  . Number of children: Not on file  . Years of education: Not on file  . Highest education level: Not on file  Occupational History  . Not on file  Tobacco Use  . Smoking status: Never Smoker  . Smokeless tobacco: Former USystems developer   Types: Chew  . Tobacco comment: per pt chewed tobacco in high school   Vaping Use  . Vaping Use: Never used  Substance and Sexual Activity  . Alcohol use: Never  . Drug use: Never  . Sexual activity: Not on file    Comment: MARRIED  Other Topics Concern  . Not on file  Social History Narrative  . Not on file   Social Determinants of Health   Financial Resource Strain: Not on file  Food Insecurity: Not on file  Transportation Needs: Not on file  Physical Activity: Not on file  Stress: Not on file  Social Connections: Not on file     Family History: The patient's family history includes Diabetes in his father; Heart disease in an other family member; High blood pressure in his mother; Stroke in his mother.  ROS:   Please see the history of present illness.    No new data all other systems reviewed and are negative.  EKGs/Labs/Other Studies Reviewed:    The  following studies were reviewed today: No new imaging  EKG:  EKG normal sinus rhythm, old inferior infarct, biatrial abnormality.  No acute change.  Recent Labs: 12/17/2019: BUN 10; Creatinine, Ser 1.12; Hemoglobin 16.5; Platelets 208; Potassium 3.4; Sodium 142  Recent Lipid Panel    Component Value Date/Time   CHOL 136 03/30/2019 1234   TRIG 178 (H) 03/30/2019 1234   HDL 36 (L) 03/30/2019 1234   CHOLHDL 3.8 03/30/2019 1234   LDLCALC 70 03/30/2019 1234    Physical Exam:    VS:  BP 130/80   Pulse 69   Ht 5' 7" (1.702 m)   Wt  155 lb 12.8 oz (70.7 kg)   SpO2 98%   BMI 24.40 kg/m     Wt Readings from Last 3 Encounters:  04/20/20 155 lb 12.8 oz (70.7 kg)  12/30/19 156 lb 9.6 oz (71 kg)  12/17/19 147 lb 11.3 oz (67 kg)     GEN: Slender and appears older than stated age.. No acute distress HEENT: Normal NECK: No JVD. LYMPHATICS: No lymphadenopathy CARDIAC: No murmur. RRR no gallop, or edema. VASCULAR:  Normal Pulses. No bruits. RESPIRATORY:  Clear to auscultation without rales, wheezing or rhonchi  ABDOMEN: Soft, non-tender, non-distended, No pulsatile mass, MUSCULOSKELETAL: No deformity  SKIN: Warm and dry NEUROLOGIC:  Alert and oriented x 3 PSYCHIATRIC:  Normal affect   ASSESSMENT:    1. Coronary artery disease involving native coronary artery of native heart with unstable angina pectoris ([place])   2. PAF (paroxysmal atrial fibrillation) (HCC)    PLAN:    In order of problems listed above:  1. Angina now associated with tachycardia.  Need to exclude DVT.  Need to exclude recurrent atrial fibrillation. 2. 30-day monitor.  He is status post ablation.  His chads vas score is greater than 4.  Plan to start Eliquis 5 mg twice per day.  Stop aspirin.  Continue Effient.  Plan clinical follow-up in 4 to 6 weeks.  Further action will be dependent upon findings from monitor.   Medication Adjustments/Labs and Tests Ordered: Current medicines are reviewed at length with the  patient today.  Concerns regarding medicines are outlined above.  Orders Placed This Encounter  Procedures  . EKG 12-Lead   No orders of the defined types were placed in this encounter.   There are no Patient Instructions on file for this visit.   Signed, Sinclair Grooms, MD  04/20/2020 3:50 PM    [place]

## 2020-04-20 NOTE — Progress Notes (Signed)
Patient ID: Terry Nelson, male   DOB: January 19, 1955, 65 y.o.   MRN: [id number] Patient enrolled for [employer] to ship a 30 day cardiac event monitor to his home.

## 2020-04-20 NOTE — Telephone Encounter (Signed)
STAT if HR is under 50 or over 120 (normal HR is 60-100 beats per minute)  1) What is your heart rate? 61  2) Do you have a log of your heart rate readings (document readings)?   3) Do you have any other symptoms? no   Pt c/o medication issue:  1. Name of Medication: metoprolol tartrate (LOPRESSOR) 100 MG tablet  2. How are you currently taking this medication (dosage and times per day)? As directed   3. Are you having a reaction (difficulty breathing--STAT)?   4. What is your medication issue? Patient wanted to know if his HR becomes elevated if he could take an additional 50 mg of metoprolol. He does not like to self medicate, but He also wants to know what to do if his HR gets elevated. He does not want to go to the ER

## 2020-04-20 NOTE — Telephone Encounter (Signed)
Spoke with pt and pt has noted elevated heart rate ranging 140-150 on a daily basis lasting anywhere from 1-5 hours also has chest pain as well Pt is currently taking Metoprolol 100 mg tid for the last 8-9 years Pt is wanting to know if can take an additional 50 mg of Metoprolol when episode occurs Per pt had ablation years ago . Will forward to Dr Katrinka Blazing for review and recommendations Also is due this month for a 4 month f/u Will forward to Constance Holster RN to get pt an appt./cy

## 2020-04-21 ENCOUNTER — Telehealth: Payer: Self-pay | Admitting: Interventional Cardiology

## 2020-04-21 NOTE — Telephone Encounter (Signed)
Pt c/o medication issue:  1. Name of Medication:amLODipine (NORVASC) 10 MG tablet  2. How are you currently taking this medication (dosage and times per day)? Patient states 1 10 MG tablet once daily  3. Are you having a reaction (difficulty breathing--STAT)? No   4. What is your medication issue?   Patient would like to clarify medication instructions to ensure he is taking it correctly.

## 2020-04-21 NOTE — Telephone Encounter (Signed)
Spoke with pt and he wanted to clarify that he is supposed to take Amlodipine 10mg  all at one time.  Picked up prescription from pharmacy and they had given him 5mg  tablets and said take two.  Advised pt to make sure it wasn't the Eliquis.  Pt checked and it truly was the Amlodipine.  Advised pt to take 10mg  all at once.  Pt agreeable to plan.

## 2020-04-26 ENCOUNTER — Telehealth: Payer: Self-pay | Admitting: Interventional Cardiology

## 2020-04-26 NOTE — Telephone Encounter (Signed)
Pt c/o medication issue:  1. Name of Medication: apixaban (ELIQUIS) 5 MG TABS tablet  2. How are you currently taking this medication (dosage and times per day)? 1 tablet twice a day  3. Are you having a reaction (difficulty breathing--STAT)? no  4. What is your medication issue? Patient states he has a kidney stone and would like to know if he can hold the eliquis until he passes it. He states he does have some blood in his urine. He states he would also like to know if he can hold off on the heart monitor. He states he received it yesterday, but has not put it on.

## 2020-04-26 NOTE — Telephone Encounter (Signed)
Pt currently has a kidney stone.  Having very minimal bleeding with urination. Inquired about holding Eliquis until it passes.  Advised pt not to stop Eliquis.  Also advised to go ahead and wear monitor.  Pt verbalized understanding.  He is going to reach out to a Urologist and see about getting an appt.  Advised to contact the office if bleeding increases.  Pt agreeable to plan. Pt did mention that he has not had anymore episodes like he was having when he was seen in the office and heart rate has been in 50s-60s.

## 2020-04-27 NOTE — Telephone Encounter (Signed)
Spoke with pt and made him aware of recommendations per Dr. Smith.  Pt agreeable to plan. 

## 2020-04-27 NOTE — Telephone Encounter (Signed)
If having hematuria, okay to hold Eliquis until hematuria stops.  If develops atrial fibrillation/rapid heart rate while off Eliquis, the medication should be resumed.  Off Eliquis, there will be some increased risk of stroke.  Hopefully the time off will be short.  Cannot stop Effient.

## 2020-04-28 ENCOUNTER — Ambulatory Visit (INDEPENDENT_AMBULATORY_CARE_PROVIDER_SITE_OTHER): Payer: BC Managed Care – PPO

## 2020-04-28 DIAGNOSIS — I480 Paroxysmal atrial fibrillation: Secondary | ICD-10-CM

## 2020-06-16 NOTE — Progress Notes (Signed)
Cardiology Office Note:    Date:  06/17/2020   ID:  Terry Nelson, DOB December 19, 1955, MRN [id number]  PCP:  Jiles Harold, PA-C  Cardiologist:  Lesleigh Noe, MD   Referring MD: Jiles Harold, PA-C   Chief Complaint  Patient presents with   Coronary Artery Disease   Atrial Fibrillation   Follow-up    Preoperative clearance    History of Present Illness:    Terry Nelson is a 65 y.o. male with a hx of  CABG in 2002 by Dr. Dorris Fetch. He has multiple overlapping proximal to distal LAD stents with ISR, PAF with prior ablation, HtN, HLD, and CKD.  Recent coronary interventions performed in December 2021, April 2021, resulted in multiple overlapping ballooning in-stent restenosis within the heavily stented for metal jacket in the LAD.  The most recent procedure resulted in improved angina since December 2021.  Minimal if any angina requiring sublingual nitroglycerin since last visit.  His problem now is left shoulder discomfort and marked reduction in mobility.  He has had previous surgery on the left shoulder.  He has essentially functioning with 1 arm currently.  He will be seeing Dr. Rennis Chris within the next 2 weeks for assessment.  He request preoperative clearance if surgery is needed.  The difficulty with [name] is that being off antiplatelet therapy places him at some risk of stent thrombosis.  His stents are very old and have in-stent restenosis, therefore the likelihood of acute thrombosis is relatively low.  Palpitations that he was having when last seen have resolved.  A continuous monitor did not demonstrate atrial fibrillation or any worrisome arrhythmias.  He did have salvos of self terminating SVT.  Past Medical History:  Diagnosis Date   Anxiety state    Arteriosclerosis of coronary artery    Body mass index (bmi) 26.0-26.9, adult    Chest pain    CHF (congestive heart failure) (HCC)    Chronic renal impairment    DDD (degenerative disc disease), lumbar    DM  (diabetes mellitus) (HCC)    Gastritis    Headache    Hemorrhage    Hyperlipidemia    Hypertension    Kidney stones    Lumbago    Neck pain    Paroxysmal A-fib (HCC)    Pernicious anemia    Unstable angina (HCC)     Past Surgical History:  Procedure Laterality Date   ABLATION  2017   CARDIAC CATHETERIZATION     CORONARY ARTERY BYPASS GRAFT     CORONARY BALLOON ANGIOPLASTY N/A 04/27/2019   Procedure: CORONARY BALLOON ANGIOPLASTY;  Surgeon: Yvonne Kendall, MD;  Location: [facility];  Service: Cardiovascular;  Laterality: N/A;   CORONARY BALLOON ANGIOPLASTY N/A 12/16/2019   Procedure: CORONARY BALLOON ANGIOPLASTY;  Surgeon: Lyn Records, MD;  Location: [facility];  Service: Cardiovascular;  Laterality: N/A;   INTRAVASCULAR ULTRASOUND/IVUS N/A 04/27/2019   Procedure: Intravascular Ultrasound/IVUS;  Surgeon: Yvonne Kendall, MD;  Location: [facility];  Service: Cardiovascular;  Laterality: N/A;   INTRAVASCULAR ULTRASOUND/IVUS N/A 12/16/2019   Procedure: Intravascular Ultrasound/IVUS;  Surgeon: Lyn Records, MD;  Location: [facility] INV[facility] LAB;  Service: Cardiovascular;  Laterality: N/A;   LEFT HEART CATH AND CORS/GRAFTS ANGIOGRAPHY N/A 04/09/2019   Procedure: LEFT HEART CATH AND CORS/GRAFTS ANGIOGRAPHY;  Surgeon: Lyn Records, MD;  Location: [facility];  Service: Cardiovascular;  Laterality: N/A;   LEFT HEART CATH AND CORS/GRAFTS ANGIOGRAPHY N/A 12/16/2019  Procedure: LEFT HEART CATH AND CORS/GRAFTS ANGIOGRAPHY;  Surgeon: Belva Crome, MD;  Location: [place] CV LAB;  Service: Cardiovascular;  Laterality: N/A;    Current Medications: Current Meds  Medication Sig   amLODipine (NORVASC) 10 MG tablet Take 10 mg by mouth daily.   apixaban (ELIQUIS) 5 MG TABS tablet Take 1 tablet (5 mg total) by mouth 2 (two) times daily.   atorvastatin (LIPITOR) 80 MG tablet Take 80 mg by mouth at bedtime.   Blood Glucose Monitoring Suppl (CONTOUR NEXT MONITOR)  w/Device KIT daily.   furosemide (LASIX) 40 MG tablet Take 20 mg by mouth daily.    glipiZIDE (GLUCOTROL XL) 10 MG 24 hr tablet Take 10 mg by mouth daily with breakfast.    glucose blood (CONTOUR NEXT TEST) test strip by Does not apply route.   Insulin Pen Needle (BD PEN NEEDLE NANO U/F) 32G X 4 MM MISC USE ONCE AS DIRECTED   isosorbide mononitrate (IMDUR) 60 MG 24 hr tablet Take 120 mg by mouth daily.   metoprolol tartrate (LOPRESSOR) 100 MG tablet Take 100 mg by mouth in the morning, at noon, and at bedtime.   nitroGLYCERIN (NITROSTAT) 0.4 MG SL tablet Place 1 tablet (0.4 mg total) under the tongue every 5 (five) minutes as needed for chest pain.   oxyCODONE-acetaminophen (PERCOCET) 10-325 MG tablet Take 1 tablet by mouth every 4 (four) hours as needed for pain.   pantoprazole (PROTONIX) 40 MG tablet Take 40 mg by mouth daily.   prasugrel (EFFIENT) 10 MG TABS tablet Take 10 mg by mouth daily.   promethazine (PHENERGAN) 25 MG tablet Take 25 mg by mouth 2 (two) times daily as needed for nausea or vomiting.   spironolactone (ALDACTONE) 25 MG tablet Take 25 mg by mouth daily.   tiZANidine (ZANAFLEX) 4 MG tablet Take 4-8 mg by mouth See admin instructions. Take 1 tablet ($RemoveB'4mg'yhOmDSeq$ ) by mouth twice daily & Take 2 tablets (8 mg) by mouth at bedtime   TOUJEO SOLOSTAR 300 UNIT/ML Solostar Pen Inject 30 Units into the skin daily.     Allergies:   Metoclopramide and Penicillins   Social History   Socioeconomic History   Marital status: Married    Spouse name: Not on file   Number of children: Not on file   Years of education: Not on file   Highest education level: Not on file  Occupational History   Not on file  Tobacco Use   Smoking status: Never   Smokeless tobacco: Former    Types: Chew   Tobacco comments:    per pt chewed tobacco in high school   Vaping Use   Vaping Use: Never used  Substance and Sexual Activity   Alcohol use: Never   Drug use: Never   Sexual activity: Not on file     Comment: MARRIED  Other Topics Concern   Not on file  Social History Narrative   Not on file   Social Determinants of Health   Financial Resource Strain: Not on file  Food Insecurity: Not on file  Transportation Needs: Not on file  Physical Activity: Not on file  Stress: Not on file  Social Connections: Not on file     Family History: The patient's family history includes Diabetes in his father; Heart disease in an other family member; High blood pressure in his mother; Stroke in his mother.  ROS:   Please see the history of present illness.    Other than shoulder discomfort he is  in good spirits.  He anticipates blood work being done in 2 weeks when he sees her.  This is Dr. Grayland Ormond, primary physician is in [place]. All other systems reviewed and are negative.  EKGs/Labs/Other Studies Reviewed:    The following studies were reviewed today:  CONTINUOUS MONITOR 04/28/2020: Study Highlights  Normal sinus rhythm Rare 4 beat VT salvo SVT salvos up to 20 beats No atrial fibrillation or sustained arrhythmia.    EKG:  EKG no new data  Recent Labs: 12/17/2019: BUN 10; Creatinine, Ser 1.12; Hemoglobin 16.5; Platelets 208; Potassium 3.4; Sodium 142  Recent Lipid Panel    Component Value Date/Time   CHOL 136 03/30/2019 1234   TRIG 178 (H) 03/30/2019 1234   HDL 36 (L) 03/30/2019 1234   CHOLHDL 3.8 03/30/2019 1234   LDLCALC 70 03/30/2019 1234    Physical Exam:    VS:  BP 122/64   Pulse 63   Ht $R'5\' 7"'gr$  (1.702 m)   Wt 156 lb (70.8 kg)   SpO2 99%   BMI 24.43 kg/m     Wt Readings from Last 3 Encounters:  06/17/20 156 lb (70.8 kg)  04/20/20 155 lb 12.8 oz (70.7 kg)  12/30/19 156 lb 9.6 oz (71 kg)     GEN: Slender. No acute distress HEENT: Normal NECK: No JVD. LYMPHATICS: No lymphadenopathy CARDIAC: No murmur. RRR no gallop, or edema. VASCULAR:  Normal Pulses. No bruits. RESPIRATORY:  Clear to auscultation without rales, wheezing or rhonchi  ABDOMEN: Soft,  non-tender, non-distended, No pulsatile mass, MUSCULOSKELETAL: No deformity  SKIN: Warm and dry NEUROLOGIC:  Alert and oriented x 3 PSYCHIATRIC:  Normal affect   ASSESSMENT:    1. 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 PAF (paroxysmal atrial fibrillation) ([place])   3. Pre-op evaluation   4. Chronic left shoulder pain   5. Essential hypertension   6. Hyperlipidemia LDL goal <70    PLAN:    In order of problems listed above:  Secondary prevention. No atrial fibrillation found on recent monitor If the patient needs left shoulder surgery, we will plan to clear him.  He will be at risk for stent thrombosis.  Stents are all in the likelihood of acute thrombosis as relatively low.  No new stents have been placed within the past year.  Therefore in preparation for surgery it would be reasonable to haul Effient for 5 days and apixaban for at least 48 hours prior to surgery.  These should be started as soon as possible after surgery. Will see Dr. Onnie Graham to determine if a surgical approach is needed. Excellent blood pressure control currently. Continue atorvastatin 80 mg/day.    Overall education and awareness concerning secondary risk prevention was discussed in detail: LDL less than 70, hemoglobin A1c less than 7, blood pressure target less than 130/80 mmHg, >150 minutes of moderate aerobic activity per week, avoidance of smoking, weight control (via diet and exercise), and continued surveillance/management of/for obstructive sleep apnea.    Medication Adjustments/Labs and Tests Ordered: Current medicines are reviewed at length with the patient today.  Concerns regarding medicines are outlined above.  No orders of the defined types were placed in this encounter.  No orders of the defined types were placed in this encounter.   Patient Instructions  Medication Instructions:  Your physician recommends that you  continue on your current medications as directed. Please refer to the Current Medication list given to you today.  *  If you need a refill on your cardiac medications before your next appointment, please call your pharmacy*   Lab Work: None If you have labs (blood work) drawn today and your tests are completely normal, you will receive your results only by: [place] (if you have MyChart) OR A paper copy in the mail If you have any lab test that is abnormal or we need to change your treatment, we will call you to review the results.   Testing/Procedures: None   Follow-Up: At [facility], you and your health needs are our priority.  As part of our continuing mission to provide you with exceptional heart care, we have created designated Provider Care Teams.  These Care Teams include your primary Cardiologist (physician) and Advanced Practice Providers (APPs -  Physician Assistants and Nurse Practitioners) who all work together to provide you with the care you need, when you need it.  We recommend signing up for the patient portal called "MyChart".  Sign up information is provided on this After Visit Summary.  MyChart is used to connect with patients for Virtual Visits (Telemedicine).  Patients are able to view lab/test results, encounter notes, upcoming appointments, etc.  Non-urgent messages can be sent to your provider as well.   To learn more about what you can do with MyChart, go to [email or web address].    Your next appointment:   5-6 month(s)  The format for your next appointment:   In Person  Provider:   You may see Sinclair Grooms, MD or one of the following Advanced Practice Providers on your designated Care Team:   Kathyrn Drown, NP   Other Instructions     Signed, Sinclair Grooms, MD  06/17/2020 2:07 PM    [place]

## 2020-06-17 ENCOUNTER — Ambulatory Visit (INDEPENDENT_AMBULATORY_CARE_PROVIDER_SITE_OTHER): Payer: BC Managed Care – PPO | Admitting: Interventional Cardiology

## 2020-06-17 ENCOUNTER — Encounter: Payer: Self-pay | Admitting: Interventional Cardiology

## 2020-06-17 ENCOUNTER — Other Ambulatory Visit: Payer: Self-pay

## 2020-06-17 VITALS — BP 122/64 | HR 63 | Ht 67.0 in | Wt 156.0 lb

## 2020-06-17 DIAGNOSIS — Z01810 Encounter for preprocedural cardiovascular examination: Secondary | ICD-10-CM

## 2020-06-17 DIAGNOSIS — I10 Essential (primary) hypertension: Secondary | ICD-10-CM

## 2020-06-17 DIAGNOSIS — M25512 Pain in left shoulder: Secondary | ICD-10-CM

## 2020-06-17 DIAGNOSIS — G8929 Other chronic pain: Secondary | ICD-10-CM

## 2020-06-17 DIAGNOSIS — I25110 Atherosclerotic heart disease of native coronary artery with unstable angina pectoris: Secondary | ICD-10-CM

## 2020-06-17 DIAGNOSIS — I480 Paroxysmal atrial fibrillation: Secondary | ICD-10-CM | POA: Diagnosis not present

## 2020-06-17 DIAGNOSIS — E785 Hyperlipidemia, unspecified: Secondary | ICD-10-CM

## 2020-06-17 DIAGNOSIS — Z01818 Encounter for other preprocedural examination: Secondary | ICD-10-CM

## 2020-06-17 NOTE — Patient Instructions (Signed)
Medication Instructions:  Your physician recommends that you continue on your current medications as directed. Please refer to the Current Medication list given to you today.  *If you need a refill on your cardiac medications before your next appointment, please call your pharmacy*   Lab Work: None If you have labs (blood work) drawn today and your tests are completely normal, you will receive your results only by: MyChart Message (if you have MyChart) OR A paper copy in the mail If you have any lab test that is abnormal or we need to change your treatment, we will call you to review the results.   Testing/Procedures: None   Follow-Up: At [facility], you and your health needs are our priority.  As part of our continuing mission to provide you with exceptional heart care, we have created designated Provider Care Teams.  These Care Teams include your primary Cardiologist (physician) and Advanced Practice Providers (APPs -  Physician Assistants and Nurse Practitioners) who all work together to provide you with the care you need, when you need it.  We recommend signing up for the patient portal called "MyChart".  Sign up information is provided on this After Visit Summary.  MyChart is used to connect with patients for Virtual Visits (Telemedicine).  Patients are able to view lab/test results, encounter notes, upcoming appointments, etc.  Non-urgent messages can be sent to your provider as well.   To learn more about what you can do with MyChart, go to [email or web address].    Your next appointment:   5-6 month(s)  The format for your next appointment:   In Person  Provider:   You may see Lesleigh Noe, MD or one of the following Advanced Practice Providers on your designated Care Team:   Georgie Chard, NP   Other Instructions

## 2020-06-29 DIAGNOSIS — M25512 Pain in left shoulder: Secondary | ICD-10-CM | POA: Diagnosis not present

## 2020-07-08 ENCOUNTER — Other Ambulatory Visit: Payer: Self-pay

## 2020-07-08 MED ORDER — NITROGLYCERIN 0.4 MG SL SUBL: 0.4000 mg | SUBLINGUAL_TABLET | SUBLINGUAL | 7 refills | Status: DC | PRN

## 2020-07-08 MED ORDER — APIXABAN 5 MG PO TABS: 5.0000 mg | ORAL_TABLET | Freq: Two times a day (BID) | ORAL | 3 refills | Status: DC

## 2020-07-18 DIAGNOSIS — Z5321 Procedure and treatment not carried out due to patient leaving prior to being seen by health care provider: Secondary | ICD-10-CM | POA: Diagnosis not present

## 2020-07-18 DIAGNOSIS — N200 Calculus of kidney: Secondary | ICD-10-CM | POA: Diagnosis not present

## 2020-07-26 DIAGNOSIS — M549 Dorsalgia, unspecified: Secondary | ICD-10-CM | POA: Diagnosis not present

## 2020-07-26 DIAGNOSIS — N50812 Left testicular pain: Secondary | ICD-10-CM | POA: Diagnosis not present

## 2020-07-29 DIAGNOSIS — E119 Type 2 diabetes mellitus without complications: Secondary | ICD-10-CM | POA: Diagnosis not present

## 2020-07-29 DIAGNOSIS — Z125 Encounter for screening for malignant neoplasm of prostate: Secondary | ICD-10-CM | POA: Diagnosis not present

## 2020-07-29 DIAGNOSIS — I10 Essential (primary) hypertension: Secondary | ICD-10-CM | POA: Diagnosis not present

## 2020-08-01 DIAGNOSIS — E1159 Type 2 diabetes mellitus with other circulatory complications: Secondary | ICD-10-CM | POA: Diagnosis not present

## 2020-08-01 DIAGNOSIS — I5032 Chronic diastolic (congestive) heart failure: Secondary | ICD-10-CM | POA: Diagnosis not present

## 2020-08-01 DIAGNOSIS — I10 Essential (primary) hypertension: Secondary | ICD-10-CM | POA: Diagnosis not present

## 2020-08-01 DIAGNOSIS — I25118 Atherosclerotic heart disease of native coronary artery with other forms of angina pectoris: Secondary | ICD-10-CM | POA: Diagnosis not present

## 2020-08-10 ENCOUNTER — Telehealth: Payer: Self-pay | Admitting: Interventional Cardiology

## 2020-08-10 NOTE — Telephone Encounter (Signed)
Patient c/o Palpitations:  High priority if patient c/o lightheadedness, shortness of breath, or chest pain  How long have you had palpitations/irregular HR/ Afib? Are you having the symptoms now? A few days now  Are you currently experiencing lightheadedness, SOB or CP? no  Do you have a history of afib (atrial fibrillation) or irregular heart rhythm? yes  Have you checked your BP or HR? (document readings if available): patient said it is stored in his Kardia mobile device. He hasn't downloaded   Are you experiencing any other symptoms? Tired all the time

## 2020-08-10 NOTE — Progress Notes (Signed)
Cardiology Office Note:    Date:  08/11/2020   ID:  Terry Nelson, DOB 12/01/1955, MRN [id number]  PCP:  Mila Palmer, PA-C   [place] [facility] Cardiologist:  Sinclair Grooms, MD     Referring MD: Mila Palmer, PA-C   CC:  Worsening Palpitations  History of Present Illness:    Terry Nelson is a 65 y.o. male with a hx of CAD s/p CABG and s/p dLAD, HTN, HLD, and PAF s/p Ablation (in [place]).  Seen 08/11/20.  Patient notes that he is doing the same things that he was in May (no change in activity, no alcohol, no interval changes from his heart monitor).  Since being seeing in May he has had worsening palpitations. Just sitting causes symptoms.    No clear trigger. Felt it with his ECG today.  Associated with a little bit of pain (different than his funny heart beats).  When he has more of these funny heart beats feels SOB.  With activity no change.   No near syncope or near syncope.    Notes that the reason his chart says CHF- many years ago he was dehydrated and had ED care and given that diagnosis.    Past Medical History:  Diagnosis Date   Anxiety state    Arteriosclerosis of coronary artery    Body mass index (bmi) 26.0-26.9, adult    Chest pain    CHF (congestive heart failure) (HCC)    Chronic renal impairment    DDD (degenerative disc disease), lumbar    DM (diabetes mellitus) ([place])    Gastritis    Headache    Hemorrhage    Hyperlipidemia    Hypertension    Kidney stones    Lumbago    Neck pain    Paroxysmal A-fib (HCC)    Pernicious anemia    Unstable angina ([place])     Past Surgical History:  Procedure Laterality Date   ABLATION  2017   CARDIAC CATHETERIZATION     CORONARY ARTERY BYPASS GRAFT     CORONARY BALLOON ANGIOPLASTY N/A 04/27/2019   Procedure: CORONARY BALLOON ANGIOPLASTY;  Surgeon: Nelva Bush, MD;  Location: [facility];  Service: Cardiovascular;  Laterality: N/A;   CORONARY BALLOON ANGIOPLASTY N/A 12/16/2019    Procedure: CORONARY BALLOON ANGIOPLASTY;  Surgeon: Belva Crome, MD;  Location: [facility];  Service: Cardiovascular;  Laterality: N/A;   INTRAVASCULAR ULTRASOUND/IVUS N/A 04/27/2019   Procedure: Intravascular Ultrasound/IVUS;  Surgeon: Nelva Bush, MD;  Location: [facility];  Service: Cardiovascular;  Laterality: N/A;   INTRAVASCULAR ULTRASOUND/IVUS N/A 12/16/2019   Procedure: Intravascular Ultrasound/IVUS;  Surgeon: Belva Crome, MD;  Location: [facility];  Service: Cardiovascular;  Laterality: N/A;   LEFT HEART CATH AND CORS/GRAFTS ANGIOGRAPHY N/A 04/09/2019   Procedure: LEFT HEART CATH AND CORS/GRAFTS ANGIOGRAPHY;  Surgeon: Belva Crome, MD;  Location: [facility];  Service: Cardiovascular;  Laterality: N/A;   LEFT HEART CATH AND CORS/GRAFTS ANGIOGRAPHY N/A 12/16/2019   Procedure: LEFT HEART CATH AND CORS/GRAFTS ANGIOGRAPHY;  Surgeon: Belva Crome, MD;  Location: [facility];  Service: Cardiovascular;  Laterality: N/A;    Current Medications: Current Meds  Medication Sig   amLODipine (NORVASC) 10 MG tablet Take 10 mg by mouth daily.   apixaban (ELIQUIS) 5 MG TABS tablet Take 1 tablet (5 mg total) by mouth 2 (two) times daily.   atorvastatin (LIPITOR) 80 MG tablet Take 80 mg  by mouth at bedtime.   Blood Glucose Monitoring Suppl (CONTOUR NEXT MONITOR) w/Device KIT daily.   furosemide (LASIX) 40 MG tablet Take 20 mg by mouth daily.    gabapentin (NEURONTIN) 300 MG capsule Take 2,100 mg by mouth in the morning and at bedtime. 3 tabs am, ($RemoveB'900mg'HKCZPyyY$ ),  4 tabs pm ($Remove'1200mg'RpzXuge$ )   glipiZIDE (GLUCOTROL XL) 10 MG 24 hr tablet Take 10 mg by mouth at bedtime.   glucose blood (CONTOUR NEXT TEST) test strip by Does not apply route.   Insulin Pen Needle (BD PEN NEEDLE NANO U/F) 32G X 4 MM MISC USE ONCE AS DIRECTED   isosorbide mononitrate (IMDUR) 60 MG 24 hr tablet Take 120 mg by mouth daily.   metoprolol (TOPROL XL) 200 MG 24 hr tablet Take 1 tablet (200 mg total) by  mouth daily.   nitroGLYCERIN (NITROSTAT) 0.4 MG SL tablet Place 1 tablet (0.4 mg total) under the tongue every 5 (five) minutes as needed for chest pain.   oxyCODONE-acetaminophen (PERCOCET) 10-325 MG tablet Take 1 tablet by mouth every 4 (four) hours as needed for pain.   pantoprazole (PROTONIX) 40 MG tablet Take 40 mg by mouth daily.   prasugrel (EFFIENT) 10 MG TABS tablet Take 10 mg by mouth daily.   promethazine (PHENERGAN) 25 MG tablet Take 25 mg by mouth 2 (two) times daily as needed for nausea or vomiting.   spironolactone (ALDACTONE) 25 MG tablet Take 25 mg by mouth daily.   tiZANidine (ZANAFLEX) 4 MG tablet Take 4-8 mg by mouth See admin instructions. Take 1 tablet ($RemoveB'4mg'oUaXgsaw$ ) by mouth twice daily & Take 2 tablets (8 mg) by mouth at bedtime   TOUJEO SOLOSTAR 300 UNIT/ML Solostar Pen Inject 30 Units into the skin daily.   [DISCONTINUED] metoprolol tartrate (LOPRESSOR) 100 MG tablet Take 100 mg by mouth in the morning, at noon, and at bedtime.     Allergies:   Metoclopramide and Penicillins   Social History   Socioeconomic History   Marital status: Married    Spouse name: Not on file   Number of children: Not on file   Years of education: Not on file   Highest education level: Not on file  Occupational History   Not on file  Tobacco Use   Smoking status: Never   Smokeless tobacco: Former    Types: Chew   Tobacco comments:    per pt chewed tobacco in high school   Vaping Use   Vaping Use: Never used  Substance and Sexual Activity   Alcohol use: Never   Drug use: Never   Sexual activity: Not on file    Comment: MARRIED  Other Topics Concern   Not on file  Social History Narrative   Not on file   Social Determinants of Health   Financial Resource Strain: Not on file  Food Insecurity: Not on file  Transportation Needs: Not on file  Physical Activity: Not on file  Stress: Not on file  Social Connections: Not on file     Family History: The patient's family history  includes Diabetes in his father; Heart disease in an other family member; High blood pressure in his mother; Stroke in his mother.  ROS:   Please see the history of present illness.     All other systems reviewed and are negative.  EKGs/Labs/Other Studies Reviewed:    The following studies were reviewed today: EKG:  EKG is  ordered today.  The ekg ordered today demonstrates  SR rate 70 with  occasional PACs  Cardiac Event Monitoring: Date: 06/01/20 Results: Normal sinus rhythm Rare 4 beat VT salvo SVT salvos up to 20 beats No atrial fibrillation or sustained arrhythmia.   Left/Right Heart Catheterizations: Date: 12/16/19 Results: Overall, anatomy is unchanged from April 2021 angiogram. The sequential graft to the first and second obtuse marginal is patent. The LIMA to the LAD was not selectively engaged because it has been proven paretic. Left main is widely patent Overlapping LAD stents from proximal to apical revealed diffuse greater than 85 to 90% narrowing throughout the distal third of the stented segment. Circumflex is widely patent and competes with flow from the sequential graft. Right coronary has moderate diffuse disease and a patent distal RCA to PDA stent.  The mid stent is widely patent without evidence of restenosis. Overlapping balloon angioplasty from the distal stent margin in the LAD to the mid LAD using various slightly oversized [place] balloons and high-pressure.  Balloon inflations reproduced index symptom.   RECOMMENDATIONS:   Continue aspirin and Brilinta Continue risk factor modification Consider balloon coated balloon angioplasty. We will discuss futility of further procedures/intervention in this territory with the patient.    Recent Labs: 12/17/2019: BUN 10; Creatinine, Ser 1.12; Hemoglobin 16.5; Platelets 208; Potassium 3.4; Sodium 142  Recent Lipid Panel    Component Value Date/Time   CHOL 136 03/30/2019 1234   TRIG 178 (H) 03/30/2019 1234   HDL 36  (L) 03/30/2019 1234   CHOLHDL 3.8 03/30/2019 1234   LDLCALC 70 03/30/2019 1234     Physical Exam:    VS:  BP 140/70   Pulse 70   Ht $R'5\' 7"'sO$  (1.702 m)   Wt 157 lb (71.2 kg)   SpO2 98%   BMI 24.59 kg/m     Wt Readings from Last 3 Encounters:  08/11/20 157 lb (71.2 kg)  06/17/20 156 lb (70.8 kg)  04/20/20 155 lb 12.8 oz (70.7 kg)    GEN:  Well nourished, well developed in no acute distress HEENT: Normal NECK: No JVD LYMPHATICS: No lymphadenopathy CARDIAC: Bouts of irregular rhythm with pause, irregular +2 radial pulses, no murmurs, rubs, gallops RESPIRATORY:  Clear to auscultation without rales, wheezing or rhonchi  ABDOMEN: Soft, non-tender, non-distended MUSCULOSKELETAL:  No edema; No deformity  SKIN: Warm and dry NEUROLOGIC:  Alert and oriented x 3 PSYCHIATRIC:  Normal affect   ASSESSMENT:    1. Coronary artery disease involving native coronary artery of native heart without angina pectoris   2. PAF (paroxysmal atrial fibrillation) ([place])   3. PAC (premature atrial contraction)   4. Coronary artery disease involving native coronary artery of native heart with unstable angina pectoris (HCC)    PLAN:    PACs  PAF s/p Ablation SVT CAD s/p CABG - given recent heart monitor and symptoms with PACs will defer additional monitor at this time - will increase metoprolol succinate 200 mg PO Daily; discussed heart rate and BP monitoring - will get echo; clarifying CHF diagnosis prior to possible diltiazem (if breakthrough) - heart monitors has shown no residual AF (has had several heart monitors) and is not currently on [facility]; will not add AC at this time - no other changes in medications  Has 11/23/20 Appt with Dr. Tamala Julian    Medication Adjustments/Labs and Tests Ordered: Current medicines are reviewed at length with the patient today.  Concerns regarding medicines are outlined above.  Orders Placed This Encounter  Procedures   EKG 12-Lead   ECHOCARDIOGRAM COMPLETE    Meds  ordered  this encounter  Medications   metoprolol (TOPROL XL) 200 MG 24 hr tablet    Sig: Take 1 tablet (200 mg total) by mouth daily.    Dispense:  90 tablet    Refill:  3     Patient Instructions  Medication Instructions:  Your physician has recommended you make the following change in your medication:  STOP: metoprolol tartrate  START:  metoprolol succinate 200 mg by mouth once daily  *If you need a refill on your cardiac medications before your next appointment, please call your pharmacy*   Lab Work: NONE If you have labs (blood work) drawn today and your tests are completely normal, you will receive your results only by: [place] (if you have MyChart) OR A paper copy in the mail If you have any lab test that is abnormal or we need to change your treatment, we will call you to review the results.   Testing/Procedures: Your physician has requested that you have an echocardiogram. Echocardiography is a painless test that uses sound waves to create images of your heart. It provides your doctor with information about the size and shape of your heart and how well your heart's chambers and valves are working. This procedure takes approximately one hour. There are no restrictions for this procedure.    Follow-Up: At [facility], you and your health needs are our priority.  As part of our continuing mission to provide you with exceptional heart care, we have created designated Provider Care Teams.  These Care Teams include your primary Cardiologist (physician) and Advanced Practice Providers (APPs -  Physician Assistants and Nurse Practitioners) who all work together to provide you with the care you need, when you need it.  We recommend signing up for the patient portal called "MyChart".  Sign up information is provided on this After Visit Summary.  MyChart is used to connect with patients for Virtual Visits (Telemedicine).  Patients are able to view lab/test results,  encounter notes, upcoming appointments, etc.  Non-urgent messages can be sent to your provider as well.   To learn more about what you can do with MyChart, go to [email or web address].    Your next appointment:   3 month(s)  The format for your next appointment:   In Person  Provider:   You will see one of the following Advanced Practice Providers on your designated Care Team:   Cecilie Kicks, NP           Signed, Werner Lean, MD  08/11/2020 12:21 PM    [place]

## 2020-08-10 NOTE — Telephone Encounter (Signed)
Spoke with pt and he states for a few days now he has noticed increased palps.  Feels very fatigued.  States rates are registering between 85-110 on [employer] device and it's saying he's in AF.  He has a history of palps/AF but states it has been occurring more often the last few days.  Had a little pain in his chest yesterday but no issues today.  Not feeling any palps right now.  Seems to occur more at night when he lays down.  Denies any missed doses of Eliquis or Metoprolol.  Scheduled pt to come in tomorrow to see Dr. Izora Ribas, DOD.  Reviewed ER precautions.  Pt appreciative for call.

## 2020-08-11 ENCOUNTER — Other Ambulatory Visit: Payer: Self-pay

## 2020-08-11 ENCOUNTER — Encounter: Payer: Self-pay | Admitting: Internal Medicine

## 2020-08-11 ENCOUNTER — Ambulatory Visit (INDEPENDENT_AMBULATORY_CARE_PROVIDER_SITE_OTHER): Payer: BC Managed Care – PPO | Admitting: Internal Medicine

## 2020-08-11 VITALS — BP 140/70 | HR 70 | Ht 67.0 in | Wt 157.0 lb

## 2020-08-11 DIAGNOSIS — I2510 Atherosclerotic heart disease of native coronary artery without angina pectoris: Secondary | ICD-10-CM | POA: Diagnosis not present

## 2020-08-11 DIAGNOSIS — I491 Atrial premature depolarization: Secondary | ICD-10-CM

## 2020-08-11 DIAGNOSIS — I25110 Atherosclerotic heart disease of native coronary artery with unstable angina pectoris: Secondary | ICD-10-CM | POA: Diagnosis not present

## 2020-08-11 DIAGNOSIS — I480 Paroxysmal atrial fibrillation: Secondary | ICD-10-CM

## 2020-08-11 HISTORY — DX: Paroxysmal atrial fibrillation: I48.0

## 2020-08-11 HISTORY — DX: Atrial premature depolarization: I49.1

## 2020-08-11 MED ORDER — METOPROLOL SUCCINATE ER 200 MG PO TB24: 200.0000 mg | ORAL_TABLET | Freq: Every day | ORAL | 3 refills | Status: DC

## 2020-08-11 NOTE — Patient Instructions (Signed)
Medication Instructions:  Your physician has recommended you make the following change in your medication:  STOP: metoprolol tartrate  START:  metoprolol succinate 200 mg by mouth once daily  *If you need a refill on your cardiac medications before your next appointment, please call your pharmacy*   Lab Work: NONE If you have labs (blood work) drawn today and your tests are completely normal, you will receive your results only by: MyChart Message (if you have MyChart) OR A paper copy in the mail If you have any lab test that is abnormal or we need to change your treatment, we will call you to review the results.   Testing/Procedures: Your physician has requested that you have an echocardiogram. Echocardiography is a painless test that uses sound waves to create images of your heart. It provides your doctor with information about the size and shape of your heart and how well your heart's chambers and valves are working. This procedure takes approximately one hour. There are no restrictions for this procedure.    Follow-Up: At [facility], you and your health needs are our priority.  As part of our continuing mission to provide you with exceptional heart care, we have created designated Provider Care Teams.  These Care Teams include your primary Cardiologist (physician) and Advanced Practice Providers (APPs -  Physician Assistants and Nurse Practitioners) who all work together to provide you with the care you need, when you need it.  We recommend signing up for the patient portal called "MyChart".  Sign up information is provided on this After Visit Summary.  MyChart is used to connect with patients for Virtual Visits (Telemedicine).  Patients are able to view lab/test results, encounter notes, upcoming appointments, etc.  Non-urgent messages can be sent to your provider as well.   To learn more about what you can do with MyChart, go to [email or web address].    Your next appointment:    3 month(s)  The format for your next appointment:   In Person  Provider:   You will see one of the following Advanced Practice Providers on your designated Care Team:   Nada Boozer, NP

## 2020-08-12 ENCOUNTER — Telehealth: Payer: Self-pay | Admitting: Interventional Cardiology

## 2020-08-12 NOTE — Telephone Encounter (Signed)
Spoke to pt. Pt will continue monitoring for now as he just took his first increased dose this morning, less than 30 min ago. Pt advised to call office if no improvement next week. Patient verbalized understanding and agreeable to plan.   (Discussed with Dr. Izora Ribas as well)

## 2020-08-12 NOTE — Telephone Encounter (Signed)
STAT if HR is under 50 or over 120 (normal HR is 60-100 beats per minute)  What is your heart rate?  109  Do you have a log of your heart rate readings (document readings)?  1:30am- 105 bpm  Do you have any other symptoms? Pt was seen yesterday and had a medicine change, pt was asked to call our office with his HR reading. Pt took his new medicine 10 minutes prior to calling

## 2020-08-26 ENCOUNTER — Other Ambulatory Visit (HOSPITAL_COMMUNITY): Payer: BC Managed Care – PPO

## 2020-09-01 ENCOUNTER — Telehealth: Payer: Self-pay | Admitting: Interventional Cardiology

## 2020-09-01 NOTE — Progress Notes (Signed)
Cardiology Office Note:    Date:  09/02/2020   ID:  CRISANTO NIED, DOB 1955/02/16, MRN [id number]  PCP:  Mila Palmer, PA-C  Cardiologist:  Sinclair Grooms, MD   Referring MD: Mila Palmer, PA-C   Chief Complaint  Patient presents with   Coronary Artery Disease     History of Present Illness:    SHERMAR FRIEDLAND is a 65 y.o. male with a hx of  CABG in 2002 by Dr. Roxan Hockey, overlapping proximal to distal LAD stents with ISR, multiple subsequent balloon angioplasty procedures, PAF with prior ablation, HtN, DM II, HLD, and CKD.   10-day history of recurring chest pain.  Was seen 08/11/2020 by Dr. Gasper Sells because of worsening palpitations and noted at that time some mild episodes of pain.  Discomfort has gotten worse since then.  He is not currently having pain.  Only 1 day over the past week he used 12 nitroglycerin tablets.  I tell him he needs to be admitted to the hospital and to undergo coronary angiography.  He will likely need repeat angioplasty +/- stenting depending upon what is found.  This is been a repetitive theme.  Unfortunately, his LAD contains a full metal jacket and he has not a candidate for repeat surgical intervention in the LAD territory.   He refuses admission to the hospital because his wife has multiple sclerosis and is totally dependent upon him.  He request that we make an outpatient appointment for catheterization as soon as possible.  On prior angiography, radial cath has been tried but is very uncomfortable for the patient.  He requests that catheterization be performed from the femoral approach.  Past Medical History:  Diagnosis Date   Anxiety state    Arteriosclerosis of coronary artery    Body mass index (bmi) 26.0-26.9, adult    Chest pain    CHF (congestive heart failure) (HCC)    Chronic renal impairment    DDD (degenerative disc disease), lumbar    DM (diabetes mellitus) ([place])    Gastritis    Headache    Hemorrhage     Hyperlipidemia    Hypertension    Kidney stones    Lumbago    Neck pain    Paroxysmal A-fib (HCC)    Pernicious anemia    Unstable angina ([place])     Past Surgical History:  Procedure Laterality Date   ABLATION  2017   CARDIAC CATHETERIZATION     CORONARY ARTERY BYPASS GRAFT     CORONARY BALLOON ANGIOPLASTY N/A 04/27/2019   Procedure: CORONARY BALLOON ANGIOPLASTY;  Surgeon: Nelva Bush, MD;  Location: [facility];  Service: Cardiovascular;  Laterality: N/A;   CORONARY BALLOON ANGIOPLASTY N/A 12/16/2019   Procedure: CORONARY BALLOON ANGIOPLASTY;  Surgeon: Belva Crome, MD;  Location: [facility];  Service: Cardiovascular;  Laterality: N/A;   INTRAVASCULAR ULTRASOUND/IVUS N/A 04/27/2019   Procedure: Intravascular Ultrasound/IVUS;  Surgeon: Nelva Bush, MD;  Location: [facility];  Service: Cardiovascular;  Laterality: N/A;   INTRAVASCULAR ULTRASOUND/IVUS N/A 12/16/2019   Procedure: Intravascular Ultrasound/IVUS;  Surgeon: Belva Crome, MD;  Location: [facility];  Service: Cardiovascular;  Laterality: N/A;   LEFT HEART CATH AND CORS/GRAFTS ANGIOGRAPHY N/A 04/09/2019   Procedure: LEFT HEART CATH AND CORS/GRAFTS ANGIOGRAPHY;  Surgeon: Belva Crome, MD;  Location: [facility];  Service: Cardiovascular;  Laterality: N/A;   LEFT HEART CATH AND CORS/GRAFTS ANGIOGRAPHY N/A 12/16/2019   Procedure: LEFT HEART CATH  AND CORS/GRAFTS ANGIOGRAPHY;  Surgeon: Belva Crome, MD;  Location: [facility];  Service: Cardiovascular;  Laterality: N/A;    Current Medications: Current Meds  Medication Sig   amLODipine (NORVASC) 10 MG tablet Take 10 mg by mouth daily.   apixaban (ELIQUIS) 5 MG TABS tablet Take 1 tablet (5 mg total) by mouth 2 (two) times daily.   atorvastatin (LIPITOR) 80 MG tablet Take 80 mg by mouth at bedtime.   Blood Glucose Monitoring Suppl (CONTOUR NEXT MONITOR) w/Device KIT daily.   furosemide (LASIX) 40 MG tablet Take 20 mg by mouth  daily.    gabapentin (NEURONTIN) 300 MG capsule Take 900-1,200 mg by mouth See admin instructions. Take 900 mg by mouth in the morning and 1200 mg at bedtime   glipiZIDE (GLUCOTROL XL) 10 MG 24 hr tablet Take 10 mg by mouth at bedtime.   glucose blood (CONTOUR NEXT TEST) test strip by Does not apply route.   Insulin Pen Needle (BD PEN NEEDLE NANO U/F) 32G X 4 MM MISC USE ONCE AS DIRECTED   isosorbide mononitrate (IMDUR) 60 MG 24 hr tablet Take 120 mg by mouth daily.   metoprolol (TOPROL XL) 200 MG 24 hr tablet Take 1 tablet (200 mg total) by mouth daily.   nitroGLYCERIN (NITROSTAT) 0.4 MG SL tablet Place 1 tablet (0.4 mg total) under the tongue every 5 (five) minutes as needed for chest pain.   oxyCODONE-acetaminophen (PERCOCET) 10-325 MG tablet Take 1 tablet by mouth every 4 (four) hours as needed for pain.   pantoprazole (PROTONIX) 40 MG tablet Take 40 mg by mouth daily.   prasugrel (EFFIENT) 10 MG TABS tablet Take 10 mg by mouth daily.   promethazine (PHENERGAN) 25 MG tablet Take 25 mg by mouth 2 (two) times daily as needed for nausea or vomiting.   spironolactone (ALDACTONE) 25 MG tablet Take 25 mg by mouth daily.   tiZANidine (ZANAFLEX) 4 MG tablet Take 4-8 mg by mouth See admin instructions. Take 4 mg by mouth in the morning, afternoon and dinner and 8 mg bedtime   TOUJEO SOLOSTAR 300 UNIT/ML Solostar Pen Inject 30 Units into the skin daily.   traZODone (DESYREL) 50 MG tablet Take 50 mg by mouth at bedtime as needed for sleep.   [DISCONTINUED] amLODipine (NORVASC) 10 MG tablet Take 10 mg by mouth daily. (Patient not taking: Reported on 09/02/2020)     Allergies:   Metoclopramide and Penicillins   Social History   Socioeconomic History   Marital status: Married    Spouse name: Not on file   Number of children: Not on file   Years of education: Not on file   Highest education level: Not on file  Occupational History   Not on file  Tobacco Use   Smoking status: Never   Smokeless  tobacco: Former    Types: Chew   Tobacco comments:    per pt chewed tobacco in high school   Vaping Use   Vaping Use: Never used  Substance and Sexual Activity   Alcohol use: Never   Drug use: Never   Sexual activity: Not on file    Comment: MARRIED  Other Topics Concern   Not on file  Social History Narrative   Not on file   Social Determinants of Health   Financial Resource Strain: Not on file  Food Insecurity: Not on file  Transportation Needs: Not on file  Physical Activity: Not on file  Stress: Not on file  Social Connections:  Not on file     Family History: The patient's family history includes Diabetes in his father; Heart disease in an other family member; High blood pressure in his mother; Stroke in his mother.  ROS:   Please see the history of present illness.    Not currently having chest discomfort.    All other systems reviewed and are negative.  EKGs/Labs/Other Studies Reviewed:    The following studies were reviewed today:  Continuous monitor completed 07/02/2020: Study Highlights  Normal sinus rhythm Rare 4 beat VT salvo SVT salvos up to 20 beats No atrial fibrillation or sustained arrhythmia.   EKG:  EKG Normal sinus rhythm, borderline short PR interval, nonspecific T wave flattening diffuse.  Biatrial abnormality.  No acute change when compared to prior.  Recent Labs: 12/17/2019: BUN 10; Creatinine, Ser 1.12; Hemoglobin 16.5; Platelets 208; Potassium 3.4; Sodium 142  Recent Lipid Panel    Component Value Date/Time   CHOL 136 03/30/2019 1234   TRIG 178 (H) 03/30/2019 1234   HDL 36 (L) 03/30/2019 1234   CHOLHDL 3.8 03/30/2019 1234   LDLCALC 70 03/30/2019 1234    Physical Exam:    VS:  BP 128/64   Pulse 67   Ht $R'5\' 7"'AA$  (1.702 m)   Wt 155 lb (70.3 kg)   SpO2 98%   BMI 24.28 kg/m     Wt Readings from Last 3 Encounters:  09/02/20 155 lb (70.3 kg)  08/11/20 157 lb (71.2 kg)  06/17/20 156 lb (70.8 kg)     GEN: No carotid bruit. No acute  distress HEENT: Normal NECK: No JVD. LYMPHATICS: No lymphadenopathy CARDIAC: No murmur. RRR positive S4 gallop without edema. VASCULAR:  Normal Pulses. No bruits. RESPIRATORY:  Clear to auscultation without rales, wheezing or rhonchi  ABDOMEN: Soft, non-tender, non-distended, No pulsatile mass, MUSCULOSKELETAL: No deformity  SKIN: Warm and dry NEUROLOGIC:  Alert and oriented x 3 PSYCHIATRIC:  Normal affect   ASSESSMENT:    1. Coronary artery disease involving native coronary artery of native heart without angina pectoris   2. PAF (paroxysmal atrial fibrillation) ([place])   3. Essential hypertension   4. Hyperlipidemia LDL goal <70   5. Controlled type 2 diabetes mellitus with other circulatory complication, without long-term current use of insulin ([place])   6. Chronic anticoagulation    PLAN:    In order of problems listed above:  Crescendo angina without acute changes on EKG, likely related to recurrent restenosis within the full metal jacket of stents in the LAD.  Will plan admission on Monday or Tuesday for cath from the right femoral approach followed by what ever intervention is necessary to help relieve anginal complaints.  The procedure and risks were discussed with the patient in detail.  He requests femoral approach rather than radial approach because of discomfort that is occurred each time he has undergone catheterization of the femoral.  He has advised to come to the emergency room for prolonged pain unrelieved by nitroglycerin.  He is advised to use nitroglycerin liberally to relieve the discomfort.  Antianginal therapy is maxed out with Imdur, Norvasc, and Toprol-XL. Does not feel the current increase in angina is associated with palpitations/A. fib.  Eliquis will be held for 48 hours prior to catheterization. Controlled. Controlled. On therapy.  Consider SGLT2 if possible. Continue Brilinta.  Hold Eliquis 48 hours prior to anticipated cardiac catheterization.  The patient  was counseled to undergo left heart catheterization, coronary angiography, and possible percutaneous coronary intervention with stent implantation. The  procedural risks and benefits were discussed in detail. The risks discussed included death, stroke, myocardial infarction, life-threatening bleeding, limb ischemia, kidney injury, allergy, and possible emergency cardiac surgery. The risk of these significant complications were estimated to occur less than 1% of the time. After discussion, the patient has agreed to proceed.    Medication Adjustments/Labs and Tests Ordered: Current medicines are reviewed at length with the patient today.  Concerns regarding medicines are outlined above.  Orders Placed This Encounter  Procedures   Basic metabolic panel   CBC   EKG 12-Lead    No orders of the defined types were placed in this encounter.   Patient Instructions  Medication Instructions:  Your physician recommends that you continue on your current medications as directed. Please refer to the Current Medication list given to you today.  *If you need a refill on your cardiac medications before your next appointment, please call your pharmacy*   Lab Work: BMET and CBC today  If you have labs (blood work) drawn today and your tests are completely normal, you will receive your results only by: [place] (if you have MyChart) OR A paper copy in the mail If you have any lab test that is abnormal or we need to change your treatment, we will call you to review the results.   Testing/Procedures: Your physician has requested that you have a cardiac catheterization. Cardiac catheterization is used to diagnose and/or treat various heart conditions. Doctors may recommend this procedure for a number of different reasons. The most common reason is to evaluate chest pain. Chest pain can be a symptom of coronary artery disease (CAD), and cardiac catheterization can show whether plaque is narrowing or  blocking your heart's arteries. This procedure is also used to evaluate the valves, as well as measure the blood flow and oxygen levels in different parts of your heart. For further information please visit [email or web address]. Please follow instruction sheet, as given.   Follow-Up: At [facility], you and your health needs are our priority.  As part of our continuing mission to provide you with exceptional heart care, we have created designated Provider Care Teams.  These Care Teams include your primary Cardiologist (physician) and Advanced Practice Providers (APPs -  Physician Assistants and Nurse Practitioners) who all work together to provide you with the care you need, when you need it.  We recommend signing up for the patient portal called "MyChart".  Sign up information is provided on this After Visit Summary.  MyChart is used to connect with patients for Virtual Visits (Telemedicine).  Patients are able to view lab/test results, encounter notes, upcoming appointments, etc.  Non-urgent messages can be sent to your provider as well.   To learn more about what you can do with MyChart, go to [email or web address].    Your next appointment:   2-3 week(s) after cath  The format for your next appointment:   In Person  Provider:   You may see Sinclair Grooms, MD or one of the following Advanced Practice Providers on your designated Care Team:   Cecilie Kicks, NP   Other [facility] OFFICE [place], [place] [address] Dept: [place]: [phone]  STCLAIR SZYMBORSKI  09/02/2020  You are scheduled for a Cardiac Catheterization on Tuesday, August 30 with Dr. Daneen Schick.  1. Please arrive at the [facility] (Main Entrance A) at [facility]: [address], [place] [address] at 8:30 AM (This time is two hours before your procedure to ensure your  preparation). Free valet parking service is available.   Special note: Every effort is made to have your procedure done on time. Please understand that emergencies sometimes delay scheduled procedures.  2. Diet: Do not eat solid foods after midnight.  The patient may have clear liquids until 5am upon the day of the procedure.  3. Labs: You will have labs drawn today  4. Medication instructions in preparation for your procedure:   Contrast Allergy: No  Stop taking Eliquis (Apixiban) on Sunday, August 28.  Do not take your Furosemide, Glipizide, Spironolactone or Toujeo the morning of your procedure.  On the morning of your procedure, take Aspirin and Effient/Prasugrel and any morning medicines NOT listed above.  You may use sips of water.  5. Plan for one night stay--bring personal belongings. 6. Bring a current list of your medications and current insurance cards. 7. You MUST have a responsible person to drive you home. 8. Someone MUST be with you the first 24 hours after you arrive home or your discharge will be delayed. 9. Please wear clothes that are easy to get on and off and wear slip-on shoes.  Thank you for allowing [place] to care for you!   -- [facility]     Signed, Sinclair Grooms, MD  09/02/2020 1:24 PM    [place]

## 2020-09-01 NOTE — H&P (View-Only) (Signed)
Cardiology Office Note:    Date:  09/02/2020   ID:  Terry Nelson, DOB 1955-02-21, MRN [id number]  PCP:  Mila Palmer, PA-C  Cardiologist:  Sinclair Grooms, MD   Referring MD: Mila Palmer, PA-C   Chief Complaint  Patient presents with   Coronary Artery Disease     History of Present Illness:    Terry Nelson is a 65 y.o. male with a hx of  CABG in 2002 by Dr. Roxan Hockey, overlapping proximal to distal LAD stents with ISR, multiple subsequent balloon angioplasty procedures, PAF with prior ablation, HtN, DM II, HLD, and CKD.   10-day history of recurring chest pain.  Was seen 08/11/2020 by Dr. Gasper Sells because of worsening palpitations and noted at that time some mild episodes of pain.  Discomfort has gotten worse since then.  He is not currently having pain.  Only 1 day over the past week he used 12 nitroglycerin tablets.  I tell him he needs to be admitted to the hospital and to undergo coronary angiography.  He will likely need repeat angioplasty +/- stenting depending upon what is found.  This is been a repetitive theme.  Unfortunately, his LAD contains a full metal jacket and he has not a candidate for repeat surgical intervention in the LAD territory.   He refuses admission to the hospital because his wife has multiple sclerosis and is totally dependent upon him.  He request that we make an outpatient appointment for catheterization as soon as possible.  On prior angiography, radial cath has been tried but is very uncomfortable for the patient.  He requests that catheterization be performed from the femoral approach.  Past Medical History:  Diagnosis Date   Anxiety state    Arteriosclerosis of coronary artery    Body mass index (bmi) 26.0-26.9, adult    Chest pain    CHF (congestive heart failure) (HCC)    Chronic renal impairment    DDD (degenerative disc disease), lumbar    DM (diabetes mellitus) ([place])    Gastritis    Headache    Hemorrhage     Hyperlipidemia    Hypertension    Kidney stones    Lumbago    Neck pain    Paroxysmal A-fib (HCC)    Pernicious anemia    Unstable angina ([place])     Past Surgical History:  Procedure Laterality Date   ABLATION  2017   CARDIAC CATHETERIZATION     CORONARY ARTERY BYPASS GRAFT     CORONARY BALLOON ANGIOPLASTY N/A 04/27/2019   Procedure: CORONARY BALLOON ANGIOPLASTY;  Surgeon: Nelva Bush, MD;  Location: [place] [facility];  Service: Cardiovascular;  Laterality: N/A;   CORONARY BALLOON ANGIOPLASTY N/A 12/16/2019   Procedure: CORONARY BALLOON ANGIOPLASTY;  Surgeon: Belva Crome, MD;  Location: [place] [facility];  Service: Cardiovascular;  Laterality: N/A;   INTRAVASCULAR ULTRASOUND/IVUS N/A 04/27/2019   Procedure: Intravascular Ultrasound/IVUS;  Surgeon: Nelva Bush, MD;  Location: [place] [facility];  Service: Cardiovascular;  Laterality: N/A;   INTRAVASCULAR ULTRASOUND/IVUS N/A 12/16/2019   Procedure: Intravascular Ultrasound/IVUS;  Surgeon: Belva Crome, MD;  Location: [place] [facility];  Service: Cardiovascular;  Laterality: N/A;   LEFT HEART CATH AND CORS/GRAFTS ANGIOGRAPHY N/A 04/09/2019   Procedure: LEFT HEART CATH AND CORS/GRAFTS ANGIOGRAPHY;  Surgeon: Belva Crome, MD;  Location: [place] [facility];  Service: Cardiovascular;  Laterality: N/A;   LEFT HEART CATH AND CORS/GRAFTS ANGIOGRAPHY N/A 12/16/2019   Procedure: LEFT HEART CATH  AND CORS/GRAFTS ANGIOGRAPHY;  Surgeon: Belva Crome, MD;  Location: [place] [facility];  Service: Cardiovascular;  Laterality: N/A;    Current Medications: Current Meds  Medication Sig   amLODipine (NORVASC) 10 MG tablet Take 10 mg by mouth daily.   apixaban (ELIQUIS) 5 MG TABS tablet Take 1 tablet (5 mg total) by mouth 2 (two) times daily.   atorvastatin (LIPITOR) 80 MG tablet Take 80 mg by mouth at bedtime.   Blood Glucose Monitoring Suppl (CONTOUR NEXT MONITOR) w/Device KIT daily.   furosemide (LASIX) 40 MG tablet Take 20 mg by mouth  daily.    gabapentin (NEURONTIN) 300 MG capsule Take 900-1,200 mg by mouth See admin instructions. Take 900 mg by mouth in the morning and 1200 mg at bedtime   glipiZIDE (GLUCOTROL XL) 10 MG 24 hr tablet Take 10 mg by mouth at bedtime.   glucose blood (CONTOUR NEXT TEST) test strip by Does not apply route.   Insulin Pen Needle (BD PEN NEEDLE NANO U/F) 32G X 4 MM MISC USE ONCE AS DIRECTED   isosorbide mononitrate (IMDUR) 60 MG 24 hr tablet Take 120 mg by mouth daily.   metoprolol (TOPROL XL) 200 MG 24 hr tablet Take 1 tablet (200 mg total) by mouth daily.   nitroGLYCERIN (NITROSTAT) 0.4 MG SL tablet Place 1 tablet (0.4 mg total) under the tongue every 5 (five) minutes as needed for chest pain.   oxyCODONE-acetaminophen (PERCOCET) 10-325 MG tablet Take 1 tablet by mouth every 4 (four) hours as needed for pain.   pantoprazole (PROTONIX) 40 MG tablet Take 40 mg by mouth daily.   prasugrel (EFFIENT) 10 MG TABS tablet Take 10 mg by mouth daily.   promethazine (PHENERGAN) 25 MG tablet Take 25 mg by mouth 2 (two) times daily as needed for nausea or vomiting.   spironolactone (ALDACTONE) 25 MG tablet Take 25 mg by mouth daily.   tiZANidine (ZANAFLEX) 4 MG tablet Take 4-8 mg by mouth See admin instructions. Take 4 mg by mouth in the morning, afternoon and dinner and 8 mg bedtime   TOUJEO SOLOSTAR 300 UNIT/ML Solostar Pen Inject 30 Units into the skin daily.   traZODone (DESYREL) 50 MG tablet Take 50 mg by mouth at bedtime as needed for sleep.   [DISCONTINUED] amLODipine (NORVASC) 10 MG tablet Take 10 mg by mouth daily. (Patient not taking: Reported on 09/02/2020)     Allergies:   Metoclopramide and Penicillins   Social History   Socioeconomic History   Marital status: Married    Spouse name: Not on file   Number of children: Not on file   Years of education: Not on file   Highest education level: Not on file  Occupational History   Not on file  Tobacco Use   Smoking status: Never   Smokeless  tobacco: Former    Types: Chew   Tobacco comments:    per pt chewed tobacco in high school   Vaping Use   Vaping Use: Never used  Substance and Sexual Activity   Alcohol use: Never   Drug use: Never   Sexual activity: Not on file    Comment: MARRIED  Other Topics Concern   Not on file  Social History Narrative   Not on file   Social Determinants of Health   Financial Resource Strain: Not on file  Food Insecurity: Not on file  Transportation Needs: Not on file  Physical Activity: Not on file  Stress: Not on file  Social Connections:  Not on file     Family History: The patient's family history includes Diabetes in his father; Heart disease in an other family member; High blood pressure in his mother; Stroke in his mother.  ROS:   Please see the history of present illness.    Not currently having chest discomfort.    All other systems reviewed and are negative.  EKGs/Labs/Other Studies Reviewed:    The following studies were reviewed today:  Continuous monitor completed 07/02/2020: Study Highlights  Normal sinus rhythm Rare 4 beat VT salvo SVT salvos up to 20 beats No atrial fibrillation or sustained arrhythmia.   EKG:  EKG Normal sinus rhythm, borderline short PR interval, nonspecific T wave flattening diffuse.  Biatrial abnormality.  No acute change when compared to prior.  Recent Labs: 12/17/2019: BUN 10; Creatinine, Ser 1.12; Hemoglobin 16.5; Platelets 208; Potassium 3.4; Sodium 142  Recent Lipid Panel    Component Value Date/Time   CHOL 136 03/30/2019 1234   TRIG 178 (H) 03/30/2019 1234   HDL 36 (L) 03/30/2019 1234   CHOLHDL 3.8 03/30/2019 1234   LDLCALC 70 03/30/2019 1234    Physical Exam:    VS:  BP 128/64   Pulse 67   Ht $R'5\' 7"'dU$  (1.702 m)   Wt 155 lb (70.3 kg)   SpO2 98%   BMI 24.28 kg/m     Wt Readings from Last 3 Encounters:  09/02/20 155 lb (70.3 kg)  08/11/20 157 lb (71.2 kg)  06/17/20 156 lb (70.8 kg)     GEN: No carotid bruit. No acute  distress HEENT: Normal NECK: No JVD. LYMPHATICS: No lymphadenopathy CARDIAC: No murmur. RRR positive S4 gallop without edema. VASCULAR:  Normal Pulses. No bruits. RESPIRATORY:  Clear to auscultation without rales, wheezing or rhonchi  ABDOMEN: Soft, non-tender, non-distended, No pulsatile mass, MUSCULOSKELETAL: No deformity  SKIN: Warm and dry NEUROLOGIC:  Alert and oriented x 3 PSYCHIATRIC:  Normal affect   ASSESSMENT:    1. Coronary artery disease involving native coronary artery of native heart without angina pectoris   2. PAF (paroxysmal atrial fibrillation) ([place])   3. Essential hypertension   4. Hyperlipidemia LDL goal <70   5. Controlled type 2 diabetes mellitus with other circulatory complication, without long-term current use of insulin ([place])   6. Chronic anticoagulation    PLAN:    In order of problems listed above:  Crescendo angina without acute changes on EKG, likely related to recurrent restenosis within the full metal jacket of stents in the LAD.  Will plan admission on Monday or Tuesday for cath from the right femoral approach followed by what ever intervention is necessary to help relieve anginal complaints.  The procedure and risks were discussed with the patient in detail.  He requests femoral approach rather than radial approach because of discomfort that is occurred each time he has undergone catheterization of the femoral.  He has advised to come to the emergency room for prolonged pain unrelieved by nitroglycerin.  He is advised to use nitroglycerin liberally to relieve the discomfort.  Antianginal therapy is maxed out with Imdur, Norvasc, and Toprol-XL. Does not feel the current increase in angina is associated with palpitations/A. fib.  Eliquis will be held for 48 hours prior to catheterization. Controlled. Controlled. On therapy.  Consider SGLT2 if possible. Continue Brilinta.  Hold Eliquis 48 hours prior to anticipated cardiac catheterization.  The patient  was counseled to undergo left heart catheterization, coronary angiography, and possible percutaneous coronary intervention with stent implantation. The  procedural risks and benefits were discussed in detail. The risks discussed included death, stroke, myocardial infarction, life-threatening bleeding, limb ischemia, kidney injury, allergy, and possible emergency cardiac surgery. The risk of these significant complications were estimated to occur less than 1% of the time. After discussion, the patient has agreed to proceed.    Medication Adjustments/Labs and Tests Ordered: Current medicines are reviewed at length with the patient today.  Concerns regarding medicines are outlined above.  Orders Placed This Encounter  Procedures   Basic metabolic panel   CBC   EKG 12-Lead    No orders of the defined types were placed in this encounter.   Patient Instructions  Medication Instructions:  Your physician recommends that you continue on your current medications as directed. Please refer to the Current Medication list given to you today.  *If you need a refill on your cardiac medications before your next appointment, please call your pharmacy*   Lab Work: BMET and CBC today  If you have labs (blood work) drawn today and your tests are completely normal, you will receive your results only by: Power (if you have MyChart) OR A paper copy in the mail If you have any lab test that is abnormal or we need to change your treatment, we will call you to review the results.   Testing/Procedures: Your physician has requested that you have a cardiac catheterization. Cardiac catheterization is used to diagnose and/or treat various heart conditions. Doctors may recommend this procedure for a number of different reasons. The most common reason is to evaluate chest pain. Chest pain can be a symptom of coronary artery disease (CAD), and cardiac catheterization can show whether plaque is narrowing or  blocking your heart's arteries. This procedure is also used to evaluate the valves, as well as measure the blood flow and oxygen levels in different parts of your heart. For further information please visit [email or web address]. Please follow instruction sheet, as given.   Follow-Up: At [facility], you and your health needs are our priority.  As part of our continuing mission to provide you with exceptional heart care, we have created designated Provider Care Teams.  These Care Teams include your primary Cardiologist (physician) and Advanced Practice Providers (APPs -  Physician Assistants and Nurse Practitioners) who all work together to provide you with the care you need, when you need it.  We recommend signing up for the patient portal called "MyChart".  Sign up information is provided on this After Visit Summary.  MyChart is used to connect with patients for Virtual Visits (Telemedicine).  Patients are able to view lab/test results, encounter notes, upcoming appointments, etc.  Non-urgent messages can be sent to your provider as well.   To learn more about what you can do with MyChart, go to [email or web address].    Your next appointment:   2-3 week(s) after cath  The format for your next appointment:   In Person  Provider:   You may see Sinclair Grooms, MD or one of the following Advanced Practice Providers on your designated Care Team:   Cecilie Kicks, NP   Other [facility] OFFICE [place], [place] [address] Dept: [place]: [phone]  ERICSON NAFZIGER  09/02/2020  You are scheduled for a Cardiac Catheterization on Tuesday, August 30 with Dr. Daneen Schick.  1. Please arrive at the [facility] (Main Entrance A) at [facility]: [address], [place] [address] at 8:30 AM (This time is two hours before your procedure to ensure your  preparation). Free valet parking service is available.   Special note: Every effort is made to have your procedure done on time. Please understand that emergencies sometimes delay scheduled procedures.  2. Diet: Do not eat solid foods after midnight.  The patient may have clear liquids until 5am upon the day of the procedure.  3. Labs: You will have labs drawn today  4. Medication instructions in preparation for your procedure:   Contrast Allergy: No  Stop taking Eliquis (Apixiban) on Sunday, August 28.  Do not take your Furosemide, Glipizide, Spironolactone or Toujeo the morning of your procedure.  On the morning of your procedure, take Aspirin and Effient/Prasugrel and any morning medicines NOT listed above.  You may use sips of water.  5. Plan for one night stay--bring personal belongings. 6. Bring a current list of your medications and current insurance cards. 7. You MUST have a responsible person to drive you home. 8. Someone MUST be with you the first 24 hours after you arrive home or your discharge will be delayed. 9. Please wear clothes that are easy to get on and off and wear slip-on shoes.  Thank you for allowing [place] to care for you!   -- [place] [facility]     Signed, Sinclair Grooms, MD  09/02/2020 1:24 PM    [place]

## 2020-09-01 NOTE — Telephone Encounter (Signed)
Patient was transferred from [facility] due to his c/o intermittent CP for the last 10 days.  He is pain free now but did have CP last night relieved with NTG.  He was see 8/4 by Dr. Izora Ribas for increased palpitations.  This is much better after medication change, however he has been having CP on and off for the last 10 days. Latest episode was last night and was relieved with NTG.  Will add on to see Dr. Katrinka Blazing for 8/26 at 12 pm.  ER precautions reviewed but he says he will only go to the ER as "last resort".   He was appreciative of Dr. Katrinka Blazing working him in.

## 2020-09-01 NOTE — Telephone Encounter (Signed)
Pt c/o of Chest Pain: STAT if CP now or developed within 24 hours  1. Are you having CP right now? no  2. Are you experiencing any other symptoms (ex. SOB, nausea, vomiting, sweating)? A little bit of everything when he is hurting   3. How long have you been experiencing CP? About a week  4. Is your CP continuous or coming and going? Comes in goes, but it has increased both in frequency and severity  5. Have you taken Nitroglycerin? Took 2 At 2:00 am today ?  The patient said that the pain is very similar to right before he had the heart cath done.  Patient considered going to the ER yesterday but he knows that he would sit there for 6-7 hours and not be seen. He does not want to go that route unless it is the ony way he knows something will get done

## 2020-09-02 ENCOUNTER — Ambulatory Visit (INDEPENDENT_AMBULATORY_CARE_PROVIDER_SITE_OTHER): Payer: BC Managed Care – PPO | Admitting: Interventional Cardiology

## 2020-09-02 ENCOUNTER — Other Ambulatory Visit: Payer: Self-pay

## 2020-09-02 ENCOUNTER — Encounter: Payer: Self-pay | Admitting: Interventional Cardiology

## 2020-09-02 VITALS — BP 128/64 | HR 67 | Ht 67.0 in | Wt 155.0 lb

## 2020-09-02 DIAGNOSIS — Z7901 Long term (current) use of anticoagulants: Secondary | ICD-10-CM

## 2020-09-02 DIAGNOSIS — E785 Hyperlipidemia, unspecified: Secondary | ICD-10-CM

## 2020-09-02 DIAGNOSIS — I10 Essential (primary) hypertension: Secondary | ICD-10-CM

## 2020-09-02 DIAGNOSIS — I480 Paroxysmal atrial fibrillation: Secondary | ICD-10-CM

## 2020-09-02 DIAGNOSIS — I2510 Atherosclerotic heart disease of native coronary artery without angina pectoris: Secondary | ICD-10-CM | POA: Diagnosis not present

## 2020-09-02 DIAGNOSIS — I25110 Atherosclerotic heart disease of native coronary artery with unstable angina pectoris: Secondary | ICD-10-CM

## 2020-09-02 DIAGNOSIS — E1159 Type 2 diabetes mellitus with other circulatory complications: Secondary | ICD-10-CM

## 2020-09-02 LAB — BASIC METABOLIC PANEL
BUN/Creatinine Ratio: 14 (ref 10–24)
BUN: 13 mg/dL (ref 8–27)
CO2: 26 mmol/L (ref 20–29)
Calcium: 9.9 mg/dL (ref 8.6–10.2)
Chloride: 96 mmol/L (ref 96–106)
Creatinine, Ser: 0.95 mg/dL (ref 0.76–1.27)
Glucose: 98 mg/dL (ref 65–99)
Potassium: 4.1 mmol/L (ref 3.5–5.2)
Sodium: 137 mmol/L (ref 134–144)
eGFR: 90 mL/min/{1.73_m2} (ref >59)

## 2020-09-02 LAB — CBC
Hematocrit: 39 % (ref 37.5–51.0)
Hemoglobin: 13.7 g/dL (ref 13.0–17.7)
MCH: 33.1 pg — ABNORMAL HIGH (ref 26.6–33.0)
MCHC: 35.1 g/dL (ref 31.5–35.7)
MCV: 94 fL (ref 79–97)
Platelets: 164 10*3/uL (ref 150–450)
RBC: 4.14 x10E6/uL (ref 4.14–5.80)
RDW: 12.7 % (ref 11.6–15.4)
WBC: 8.6 10*3/uL (ref 3.4–10.8)

## 2020-09-02 NOTE — Patient Instructions (Signed)
Medication Instructions:  Your physician recommends that you continue on your current medications as directed. Please refer to the Current Medication list given to you today.  *If you need a refill on your cardiac medications before your next appointment, please call your pharmacy*   Lab Work: BMET and CBC today  If you have labs (blood work) drawn today and your tests are completely normal, you will receive your results only by: MyChart Message (if you have MyChart) OR A paper copy in the mail If you have any lab test that is abnormal or we need to change your treatment, we will call you to review the results.   Testing/Procedures: Your physician has requested that you have a cardiac catheterization. Cardiac catheterization is used to diagnose and/or treat various heart conditions. Doctors may recommend this procedure for a number of different reasons. The most common reason is to evaluate chest pain. Chest pain can be a symptom of coronary artery disease (CAD), and cardiac catheterization can show whether plaque is narrowing or blocking your heart's arteries. This procedure is also used to evaluate the valves, as well as measure the blood flow and oxygen levels in different parts of your heart. For further information please visit [email or web address] Please follow instruction sheet, as given.   Follow-Up: At [facility], you and your health needs are our priority.  As part of our continuing mission to provide you with exceptional heart care, we have created designated Provider Care Teams.  These Care Teams include your primary Cardiologist (physician) and Advanced Practice Providers (APPs -  Physician Assistants and Nurse Practitioners) who all work together to provide you with the care you need, when you need it.  We recommend signing up for the patient portal called "MyChart".  Sign up information is provided on this After Visit Summary.  MyChart is used to connect with patients for  Virtual Visits (Telemedicine).  Patients are able to view lab/test results, encounter notes, upcoming appointments, etc.  Non-urgent messages can be sent to your provider as well.   To learn more about what you can do with MyChart, go to [email or web address].    Your next appointment:   2-3 week(s) after cath  The format for your next appointment:   In Person  Provider:   You may see Lesleigh Noe, MD or one of the following Advanced Practice Providers on your designated Care Team:   Nada Boozer, NP   Other Instructions   [facility] CARDIOVASCULAR DIVISION [facility] ST OFFICE [address] [place] [address] Dept: [phone] Loc: [phone]  NICHOLAI WILLETTE  09/02/2020  You are scheduled for a Cardiac Catheterization on Tuesday, August 30 with Dr. Verdis Prime.  1. Please arrive at the [facility] (Main Entrance A) at [facility]: [address] [place], [place] [address] at 8:30 AM (This time is two hours before your procedure to ensure your preparation). Free valet parking service is available.   Special note: Every effort is made to have your procedure done on time. Please understand that emergencies sometimes delay scheduled procedures.  2. Diet: Do not eat solid foods after midnight.  The patient may have clear liquids until 5am upon the day of the procedure.  3. Labs: You will have labs drawn today  4. Medication instructions in preparation for your procedure:   Contrast Allergy: No  Stop taking Eliquis (Apixiban) on Sunday, August 28.  Do not take your Furosemide, Glipizide, Spironolactone  or Toujeo the morning of your procedure.  On the morning of your procedure, take Aspirin and Effient/Prasugrel and any morning medicines NOT listed above.  You may use sips of water.  5. Plan for one night stay--bring personal belongings. 6. Bring a current list of your medications and current insurance  cards. 7. You MUST have a responsible person to drive you home. 8. Someone MUST be with you the first 24 hours after you arrive home or your discharge will be delayed. 9. Please wear clothes that are easy to get on and off and wear slip-on shoes.  Thank you for allowing [place] to care for you!   -- [place] [facility]

## 2020-09-05 ENCOUNTER — Telehealth: Payer: Self-pay | Admitting: *Deleted

## 2020-09-05 NOTE — Telephone Encounter (Signed)
Cardiac catheterization scheduled at [facility] for: Tuesday September 06, 2020 10:30 AM [facility] Main Entrance A [facility]) at: 8:30 AM   No solid food after midnight prior to cath, clear liquids until 5 AM day of procedure.  Medication instructions: Hold: -Eliquis-none 09/04/20 until post procedure -Toujeo-AM of procedure -Lasix-AM of procedure -Spironolactone-AM of procedure -Glipizide-pt reports he takes this in the evening/bedtime  Except hold medications morning medications can be taken pre-cath with sips of water including: - aspirin 81 mg -Effient 10 mg    Confirmed patient has responsible adult to drive home post procedure and be with patient first 24 hours after arriving home.  Patients are allowed one visitor in the waiting room during the time they are at the hospital for their procedure. Both patient and visitor must wear a mask once they enter the hospital.   Patient reports does not currently have any symptoms concerning for COVID-19 and no household members with COVID-19 like illness.      Reviewed procedure/mask/visitor instructions with patient.

## 2020-09-06 ENCOUNTER — Ambulatory Visit (HOSPITAL_COMMUNITY)
Admission: RE | Disposition: A | Discharge status: Home / Self Care | Payer: Self-pay | Source: Home / Self Care | Attending: Interventional Cardiology

## 2020-09-06 ENCOUNTER — Encounter (HOSPITAL_COMMUNITY): Payer: Self-pay | Admitting: Interventional Cardiology

## 2020-09-06 ENCOUNTER — Other Ambulatory Visit: Payer: Self-pay

## 2020-09-06 ENCOUNTER — Ambulatory Visit (HOSPITAL_COMMUNITY)
Admission: RE | Admit: 2020-09-06 | Discharge: 2020-09-07 | Disposition: A | Discharge status: Home / Self Care | Payer: BC Managed Care – PPO | Attending: Interventional Cardiology | Admitting: Interventional Cardiology

## 2020-09-06 DIAGNOSIS — I491 Atrial premature depolarization: Secondary | ICD-10-CM | POA: Insufficient documentation

## 2020-09-06 DIAGNOSIS — I130 Hypertensive heart and chronic kidney disease with heart failure and stage 1 through stage 4 chronic kidney disease, or unspecified chronic kidney disease: Secondary | ICD-10-CM | POA: Diagnosis not present

## 2020-09-06 DIAGNOSIS — I209 Angina pectoris, unspecified: Secondary | ICD-10-CM | POA: Diagnosis present

## 2020-09-06 DIAGNOSIS — Z880 Allergy status to penicillin: Secondary | ICD-10-CM | POA: Diagnosis not present

## 2020-09-06 DIAGNOSIS — E785 Hyperlipidemia, unspecified: Secondary | ICD-10-CM | POA: Insufficient documentation

## 2020-09-06 DIAGNOSIS — I10 Essential (primary) hypertension: Secondary | ICD-10-CM | POA: Diagnosis present

## 2020-09-06 DIAGNOSIS — Z79899 Other long term (current) drug therapy: Secondary | ICD-10-CM | POA: Insufficient documentation

## 2020-09-06 DIAGNOSIS — Z888 Allergy status to other drugs, medicaments and biological substances status: Secondary | ICD-10-CM | POA: Diagnosis not present

## 2020-09-06 DIAGNOSIS — I509 Heart failure, unspecified: Secondary | ICD-10-CM | POA: Diagnosis not present

## 2020-09-06 DIAGNOSIS — E1122 Type 2 diabetes mellitus with diabetic chronic kidney disease: Secondary | ICD-10-CM | POA: Diagnosis not present

## 2020-09-06 DIAGNOSIS — I2510 Atherosclerotic heart disease of native coronary artery without angina pectoris: Secondary | ICD-10-CM

## 2020-09-06 DIAGNOSIS — Z7901 Long term (current) use of anticoagulants: Secondary | ICD-10-CM | POA: Diagnosis not present

## 2020-09-06 DIAGNOSIS — Z951 Presence of aortocoronary bypass graft: Secondary | ICD-10-CM | POA: Insufficient documentation

## 2020-09-06 DIAGNOSIS — I480 Paroxysmal atrial fibrillation: Secondary | ICD-10-CM | POA: Diagnosis not present

## 2020-09-06 DIAGNOSIS — I25110 Atherosclerotic heart disease of native coronary artery with unstable angina pectoris: Secondary | ICD-10-CM | POA: Diagnosis not present

## 2020-09-06 DIAGNOSIS — Z20822 Contact with and (suspected) exposure to covid-19: Secondary | ICD-10-CM | POA: Insufficient documentation

## 2020-09-06 DIAGNOSIS — Z87891 Personal history of nicotine dependence: Secondary | ICD-10-CM | POA: Insufficient documentation

## 2020-09-06 DIAGNOSIS — T82855A Stenosis of coronary artery stent, initial encounter: Secondary | ICD-10-CM | POA: Diagnosis not present

## 2020-09-06 DIAGNOSIS — N189 Chronic kidney disease, unspecified: Secondary | ICD-10-CM | POA: Insufficient documentation

## 2020-09-06 DIAGNOSIS — Z9861 Coronary angioplasty status: Secondary | ICD-10-CM

## 2020-09-06 DIAGNOSIS — Z7984 Long term (current) use of oral hypoglycemic drugs: Secondary | ICD-10-CM | POA: Diagnosis not present

## 2020-09-06 DIAGNOSIS — Z7902 Long term (current) use of antithrombotics/antiplatelets: Secondary | ICD-10-CM | POA: Insufficient documentation

## 2020-09-06 HISTORY — PX: CORONARY BALLOON ANGIOPLASTY: CATH118233

## 2020-09-06 HISTORY — PX: LEFT HEART CATH AND CORS/GRAFTS ANGIOGRAPHY: CATH118250

## 2020-09-06 LAB — CBC
HCT: 41.5 % (ref 39.0–52.0)
Hemoglobin: 14.4 g/dL (ref 13.0–17.0)
MCH: 32.7 pg (ref 26.0–34.0)
MCHC: 34.7 g/dL (ref 30.0–36.0)
MCV: 94.3 fL (ref 80.0–100.0)
Platelets: 151 10*3/uL (ref 150–400)
RBC: 4.4 MIL/uL (ref 4.22–5.81)
RDW: 12.6 % (ref 11.5–15.5)
WBC: 9.9 10*3/uL (ref 4.0–10.5)
nRBC: 0 % (ref 0.0–0.2)

## 2020-09-06 LAB — GLUCOSE, CAPILLARY
Glucose-Capillary: 78 mg/dL (ref 70–99)
Glucose-Capillary: 70 mg/dL (ref 70–99)
Glucose-Capillary: 102 mg/dL — ABNORMAL HIGH (ref 70–99)
Glucose-Capillary: 123 mg/dL — ABNORMAL HIGH (ref 70–99)
Glucose-Capillary: 187 mg/dL — ABNORMAL HIGH (ref 70–99)

## 2020-09-06 LAB — CREATININE, SERUM
Creatinine, Ser: 0.88 mg/dL (ref 0.61–1.24)
GFR, Estimated: >60 mL/min (ref >60)

## 2020-09-06 LAB — SARS CORONAVIRUS 2 BY RT PCR (HOSPITAL ORDER, PERFORMED IN ~~LOC~~ HOSPITAL LAB): SARS Coronavirus 2: NEGATIVE

## 2020-09-06 SURGERY — LEFT HEART CATH AND CORS/GRAFTS ANGIOGRAPHY
Anesthesia: LOCAL

## 2020-09-06 MED ORDER — LIDOCAINE HCL (PF) 1 % IJ SOLN
INTRAMUSCULAR | Status: AC
  Filled 2020-09-06: qty 30

## 2020-09-06 MED ORDER — SODIUM CHLORIDE 0.9% FLUSH: 3.0000 mL | INTRAVENOUS | Status: DC | PRN

## 2020-09-06 MED ORDER — HEPARIN (PORCINE) IN NACL 1000-0.9 UT/500ML-% IV SOLN
INTRAVENOUS | Status: DC | PRN
  Administered 2020-09-06 (×2): 500 mL

## 2020-09-06 MED ORDER — FENTANYL CITRATE (PF) 100 MCG/2ML IJ SOLN
INTRAMUSCULAR | Status: AC
  Filled 2020-09-06: qty 2

## 2020-09-06 MED ORDER — VERAPAMIL HCL 2.5 MG/ML IV SOLN
INTRAVENOUS | Status: AC
  Filled 2020-09-06: qty 2

## 2020-09-06 MED ORDER — METOPROLOL SUCCINATE ER 100 MG PO TB24
200.0000 mg | ORAL_TABLET | Freq: Every day | ORAL | Status: DC
  Administered 2020-09-07: 200 mg via ORAL
  Filled 2020-09-06: qty 2

## 2020-09-06 MED ORDER — MIDAZOLAM HCL 2 MG/2ML IJ SOLN
INTRAMUSCULAR | Status: DC | PRN
  Administered 2020-09-06: 0.5 mg via INTRAVENOUS
  Administered 2020-09-06: 1.5 mg via INTRAVENOUS
  Administered 2020-09-06 (×2): 0.5 mg via INTRAVENOUS

## 2020-09-06 MED ORDER — LIDOCAINE HCL (PF) 1 % IJ SOLN
INTRAMUSCULAR | Status: DC | PRN
  Administered 2020-09-06: 12 mL via SUBCUTANEOUS

## 2020-09-06 MED ORDER — HEPARIN SODIUM (PORCINE) 5000 UNIT/ML IJ SOLN: 5000.0000 [IU] | Freq: Three times a day (TID) | INTRAMUSCULAR | Status: DC

## 2020-09-06 MED ORDER — NITROGLYCERIN 1 MG/10 ML FOR IR/CATH LAB
INTRA_ARTERIAL | Status: AC
  Filled 2020-09-06: qty 10

## 2020-09-06 MED ORDER — NITROGLYCERIN 1 MG/10 ML FOR IR/CATH LAB
INTRA_ARTERIAL | Status: DC | PRN
  Administered 2020-09-06: 200 ug via INTRACORONARY

## 2020-09-06 MED ORDER — INSULIN GLARGINE-YFGN 100 UNIT/ML ~~LOC~~ SOLN
30.0000 [IU] | Freq: Every day | SUBCUTANEOUS | Status: DC
  Filled 2020-09-06 (×3): qty 0.3

## 2020-09-06 MED ORDER — APIXABAN 5 MG PO TABS
5.0000 mg | ORAL_TABLET | Freq: Two times a day (BID) | ORAL | Status: DC
  Administered 2020-09-07: 5 mg via ORAL
  Filled 2020-09-06: qty 1

## 2020-09-06 MED ORDER — SODIUM CHLORIDE 0.9 % WEIGHT BASED INFUSION: 1.0000 mL/kg/h | INTRAVENOUS | Status: AC

## 2020-09-06 MED ORDER — ATORVASTATIN CALCIUM 80 MG PO TABS: 80.0000 mg | ORAL_TABLET | Freq: Every day | ORAL | Status: DC

## 2020-09-06 MED ORDER — MIDAZOLAM HCL 2 MG/2ML IJ SOLN
INTRAMUSCULAR | Status: AC
  Filled 2020-09-06: qty 2

## 2020-09-06 MED ORDER — MIDAZOLAM HCL 2 MG/2ML IJ SOLN
INTRAMUSCULAR | Status: DC | PRN
Start: 2020-09-06 — End: 2020-09-06
  Administered 2020-09-06: 0.5 mg via INTRAVENOUS

## 2020-09-06 MED ORDER — PROMETHAZINE HCL 25 MG PO TABS: 25.0000 mg | ORAL_TABLET | Freq: Two times a day (BID) | ORAL | Status: DC | PRN

## 2020-09-06 MED ORDER — GABAPENTIN 300 MG PO CAPS: 900.0000 mg | ORAL_CAPSULE | ORAL | Status: DC

## 2020-09-06 MED ORDER — PRASUGREL HCL 10 MG PO TABS
10.0000 mg | ORAL_TABLET | Freq: Every day | ORAL | Status: DC
  Administered 2020-09-07: 10 mg via ORAL
  Filled 2020-09-06: qty 1

## 2020-09-06 MED ORDER — LABETALOL HCL 5 MG/ML IV SOLN: 10.0000 mg | INTRAVENOUS | Status: AC | PRN

## 2020-09-06 MED ORDER — PRASUGREL HCL 10 MG PO TABS: 10.0000 mg | ORAL_TABLET | ORAL | Status: DC

## 2020-09-06 MED ORDER — HEPARIN SODIUM (PORCINE) 1000 UNIT/ML IJ SOLN
INTRAMUSCULAR | Status: AC
  Filled 2020-09-06: qty 1

## 2020-09-06 MED ORDER — ASPIRIN 81 MG PO CHEW
81.0000 mg | CHEWABLE_TABLET | Freq: Every day | ORAL | Status: DC
  Administered 2020-09-07: 81 mg via ORAL
  Filled 2020-09-06: qty 1

## 2020-09-06 MED ORDER — TIZANIDINE HCL 4 MG PO TABS: 4.0000 mg | ORAL_TABLET | ORAL | Status: DC

## 2020-09-06 MED ORDER — SODIUM CHLORIDE 0.9 % IV SOLN: 250.0000 mL | INTRAVENOUS | Status: DC | PRN

## 2020-09-06 MED ORDER — ASPIRIN 81 MG PO CHEW
81.0000 mg | CHEWABLE_TABLET | ORAL | Status: AC
  Administered 2020-09-06: 81 mg via ORAL
  Filled 2020-09-06: qty 1

## 2020-09-06 MED ORDER — SODIUM CHLORIDE 0.9 % WEIGHT BASED INFUSION: 1.0000 mL/kg/h | INTRAVENOUS | Status: DC

## 2020-09-06 MED ORDER — FUROSEMIDE 20 MG PO TABS
20.0000 mg | ORAL_TABLET | Freq: Every day | ORAL | Status: DC
  Administered 2020-09-06 – 2020-09-07 (×2): 20 mg via ORAL
  Filled 2020-09-06 (×2): qty 1

## 2020-09-06 MED ORDER — TRAZODONE HCL 50 MG PO TABS: 50.0000 mg | ORAL_TABLET | Freq: Every evening | ORAL | Status: DC | PRN

## 2020-09-06 MED ORDER — BIVALIRUDIN TRIFLUOROACETATE 250 MG IV SOLR
INTRAVENOUS | Status: AC
  Filled 2020-09-06: qty 250

## 2020-09-06 MED ORDER — ONDANSETRON HCL 4 MG/2ML IJ SOLN: 4.0000 mg | Freq: Four times a day (QID) | INTRAMUSCULAR | Status: DC | PRN

## 2020-09-06 MED ORDER — SODIUM CHLORIDE 0.9 % WEIGHT BASED INFUSION
3.0000 mL/kg/h | INTRAVENOUS | Status: DC
  Administered 2020-09-06: 3 mL/kg/h via INTRAVENOUS

## 2020-09-06 MED ORDER — FENTANYL CITRATE (PF) 100 MCG/2ML IJ SOLN
INTRAMUSCULAR | Status: DC | PRN
  Administered 2020-09-06: 25 ug via INTRAVENOUS
  Administered 2020-09-06: 50 ug via INTRAVENOUS
  Administered 2020-09-06 (×2): 25 ug via INTRAVENOUS

## 2020-09-06 MED ORDER — SODIUM CHLORIDE 0.9% FLUSH
3.0000 mL | Freq: Two times a day (BID) | INTRAVENOUS | Status: DC
  Administered 2020-09-06 – 2020-09-07 (×2): 3 mL via INTRAVENOUS

## 2020-09-06 MED ORDER — TIZANIDINE HCL 4 MG PO TABS
4.0000 mg | ORAL_TABLET | ORAL | Status: DC
  Filled 2020-09-06 (×2): qty 1

## 2020-09-06 MED ORDER — SODIUM CHLORIDE 0.9 % IV SOLN
INTRAVENOUS | Status: AC | PRN
  Administered 2020-09-06: 1.75 mg/kg/h via INTRAVENOUS

## 2020-09-06 MED ORDER — PANTOPRAZOLE SODIUM 40 MG PO TBEC
40.0000 mg | DELAYED_RELEASE_TABLET | Freq: Every day | ORAL | Status: DC
  Administered 2020-09-07: 40 mg via ORAL
  Filled 2020-09-06: qty 1

## 2020-09-06 MED ORDER — OXYCODONE HCL 5 MG PO TABS: 5.0000 mg | ORAL_TABLET | ORAL | Status: DC | PRN

## 2020-09-06 MED ORDER — PRASUGREL HCL 10 MG PO TABS: 10.0000 mg | ORAL_TABLET | Freq: Every day | ORAL | Status: DC

## 2020-09-06 MED ORDER — OXYCODONE-ACETAMINOPHEN 10-325 MG PO TABS: 1.0000 | ORAL_TABLET | ORAL | Status: DC | PRN

## 2020-09-06 MED ORDER — ATORVASTATIN CALCIUM 80 MG PO TABS
80.0000 mg | ORAL_TABLET | Freq: Every day | ORAL | Status: DC
  Administered 2020-09-06: 80 mg via ORAL
  Filled 2020-09-06: qty 1

## 2020-09-06 MED ORDER — GABAPENTIN 300 MG PO CAPS
900.0000 mg | ORAL_CAPSULE | Freq: Every morning | ORAL | Status: DC
  Administered 2020-09-07: 900 mg via ORAL
  Filled 2020-09-06: qty 3

## 2020-09-06 MED ORDER — ACETAMINOPHEN 325 MG PO TABS
650.0000 mg | ORAL_TABLET | ORAL | Status: DC | PRN
  Administered 2020-09-06: 650 mg via ORAL
  Filled 2020-09-06: qty 2

## 2020-09-06 MED ORDER — TIZANIDINE HCL 4 MG PO TABS
8.0000 mg | ORAL_TABLET | Freq: Every day | ORAL | Status: DC
  Administered 2020-09-06: 8 mg via ORAL
  Filled 2020-09-06 (×2): qty 2

## 2020-09-06 MED ORDER — BIVALIRUDIN BOLUS VIA INFUSION - CUPID
INTRAVENOUS | Status: DC | PRN
  Administered 2020-09-06: 52.725 mg via INTRAVENOUS

## 2020-09-06 MED ORDER — FENTANYL CITRATE (PF) 100 MCG/2ML IJ SOLN
INTRAMUSCULAR | Status: DC | PRN
  Administered 2020-09-06: 25 ug via INTRAVENOUS

## 2020-09-06 MED ORDER — SPIRONOLACTONE 25 MG PO TABS
25.0000 mg | ORAL_TABLET | Freq: Every day | ORAL | Status: DC
  Administered 2020-09-07: 25 mg via ORAL
  Filled 2020-09-06: qty 1

## 2020-09-06 MED ORDER — ISOSORBIDE MONONITRATE ER 60 MG PO TB24
120.0000 mg | ORAL_TABLET | Freq: Every day | ORAL | Status: DC
  Administered 2020-09-07: 120 mg via ORAL
  Filled 2020-09-06: qty 2

## 2020-09-06 MED ORDER — GLIPIZIDE ER 5 MG PO TB24
10.0000 mg | ORAL_TABLET | Freq: Every day | ORAL | Status: DC
  Administered 2020-09-06: 10 mg via ORAL
  Filled 2020-09-06: qty 2

## 2020-09-06 MED ORDER — GABAPENTIN 600 MG PO TABS
1200.0000 mg | ORAL_TABLET | Freq: Every day | ORAL | Status: DC
  Administered 2020-09-06: 1200 mg via ORAL
  Filled 2020-09-06: qty 2

## 2020-09-06 MED ORDER — POLYVINYL ALCOHOL 1.4 % OP SOLN: 1.0000 [drp] | Freq: Two times a day (BID) | OPHTHALMIC | Status: DC | PRN

## 2020-09-06 MED ORDER — OXYCODONE-ACETAMINOPHEN 5-325 MG PO TABS
1.0000 | ORAL_TABLET | ORAL | Status: DC | PRN
Start: 2020-09-06 — End: 2020-09-07

## 2020-09-06 MED ORDER — NITROGLYCERIN 0.4 MG SL SUBL: 0.4000 mg | SUBLINGUAL_TABLET | SUBLINGUAL | Status: DC | PRN

## 2020-09-06 MED ORDER — HYDRALAZINE HCL 20 MG/ML IJ SOLN: 10.0000 mg | INTRAMUSCULAR | Status: AC | PRN

## 2020-09-06 MED ORDER — AMLODIPINE BESYLATE 10 MG PO TABS
10.0000 mg | ORAL_TABLET | Freq: Every day | ORAL | Status: DC
  Administered 2020-09-07: 10 mg via ORAL
  Filled 2020-09-06: qty 1

## 2020-09-06 MED ORDER — SODIUM CHLORIDE 0.9% FLUSH: 3.0000 mL | Freq: Two times a day (BID) | INTRAVENOUS | Status: DC

## 2020-09-06 SURGICAL SUPPLY — 18 items
BALL SAPPHIRE NC24 2.0X22 (BALLOONS) ×2
BALLN SCOREFLEX 2.50X20 (BALLOONS) ×2
BALLN SCOREFLEX 3.50X20 (BALLOONS) ×2
BALLOON SAPPHIRE NC24 2.0X22 (BALLOONS) IMPLANT
BALLOON SCOREFLEX 2.50X20 (BALLOONS) IMPLANT
BALLOON SCOREFLEX 3.50X20 (BALLOONS) IMPLANT
CATH INFINITI 5FR MPB2 (CATHETERS) ×1 IMPLANT
CATH INFINITI 5FR MULTPACK ANG (CATHETERS) ×1 IMPLANT
CATH LAUNCHER 6FR EBU 3.75 (CATHETERS) ×1 IMPLANT
KIT ENCORE 26 ADVANTAGE (KITS) ×1 IMPLANT
KIT HEART LEFT (KITS) ×2 IMPLANT
PACK CARDIAC CATHETERIZATION (CUSTOM PROCEDURE TRAY) ×2 IMPLANT
SHEATH PINNACLE 6F 10CM (SHEATH) ×1 IMPLANT
SHEATH PROBE COVER 6X72 (BAG) ×1 IMPLANT
TRANSDUCER W/STOPCOCK (MISCELLANEOUS) ×2 IMPLANT
TUBING CIL FLEX 10 FLL-RA (TUBING) ×2 IMPLANT
WIRE ASAHI PROWATER 180CM (WIRE) ×1 IMPLANT
WIRE EMERALD 3MM-J .035X150CM (WIRE) ×1 IMPLANT

## 2020-09-06 NOTE — Progress Notes (Signed)
Site area: right groin  Site Prior to Removal:  Level 0  Pressure Applied For 22 MINUTES    Minutes Beginning at 1445  Manual:   Yes.    Patient Status During Pull:  stable  Post Pull Groin Site:  Level 0  Post Pull Instructions Given:  Yes.    Post Pull Pulses Present:  Yes.    Dressing Applied:  Yes.    Comments:  Bed rest started at 1520 X 4 hr.

## 2020-09-06 NOTE — Interval H&P Note (Signed)
Cath Lab Visit (complete for each Cath Lab visit)  Clinical Evaluation Leading to the Procedure:   ACS: No.  Non-ACS:    Anginal Classification: CCS IV  Anti-ischemic medical therapy: Maximal Therapy (2 or more classes of medications)  Non-Invasive Test Results: No non-invasive testing performed  Prior CABG: Previous CABG      History and Physical Interval Note:  09/06/2020 10:23 AM  Terry Nelson  has presented today for surgery, with the diagnosis of chest pain, angina.  The various methods of treatment have been discussed with the patient and family. After consideration of risks, benefits and other options for treatment, the patient has consented to  Procedure(s): LEFT HEART CATH AND CORS/GRAFTS ANGIOGRAPHY (N/A) as a surgical intervention.  The patient's history has been reviewed, patient examined, no change in status, stable for surgery.  I have reviewed the patient's chart and labs.  Questions were answered to the patient's satisfaction.     Lyn Records III

## 2020-09-06 NOTE — CV Procedure (Signed)
Diffuse in-stent restenosis from proximal to apical within the previously placed overlapping stents. Treated with overlapping scoring balloon maintaining TIMI grade III flow and reducing greater than 90% regions of stenosis to less than 50%.  A 2.5 x 20 and a 3.5 x 20 ScoreFlex were used in overlapping fashion.  The 2.5 was used in the apical segment but inflated to only 3 atm. Patent vein graft to the obtuse marginal Patent native right coronary Widely patent left main LAD contains ostial to proximal 50% narrowing unchanged from prior.  Proximal to apical diffuse in-stent restenosis noted as mentioned above. Angiomax used as anticoagulant.  ACT Greater than 300 seconds.  Patient already receiving Effient.  Resume Eliquis in a.m. Discharge in a.m.

## 2020-09-07 ENCOUNTER — Other Ambulatory Visit (HOSPITAL_COMMUNITY): Payer: Self-pay

## 2020-09-07 DIAGNOSIS — Z20822 Contact with and (suspected) exposure to covid-19: Secondary | ICD-10-CM | POA: Diagnosis not present

## 2020-09-07 DIAGNOSIS — N189 Chronic kidney disease, unspecified: Secondary | ICD-10-CM | POA: Diagnosis not present

## 2020-09-07 DIAGNOSIS — I480 Paroxysmal atrial fibrillation: Secondary | ICD-10-CM | POA: Diagnosis not present

## 2020-09-07 DIAGNOSIS — Z7901 Long term (current) use of anticoagulants: Secondary | ICD-10-CM | POA: Diagnosis not present

## 2020-09-07 DIAGNOSIS — Z9861 Coronary angioplasty status: Secondary | ICD-10-CM | POA: Diagnosis not present

## 2020-09-07 DIAGNOSIS — Z7902 Long term (current) use of antithrombotics/antiplatelets: Secondary | ICD-10-CM | POA: Diagnosis not present

## 2020-09-07 DIAGNOSIS — I209 Angina pectoris, unspecified: Secondary | ICD-10-CM | POA: Diagnosis not present

## 2020-09-07 DIAGNOSIS — Z951 Presence of aortocoronary bypass graft: Secondary | ICD-10-CM | POA: Diagnosis not present

## 2020-09-07 DIAGNOSIS — E785 Hyperlipidemia, unspecified: Secondary | ICD-10-CM

## 2020-09-07 DIAGNOSIS — Z880 Allergy status to penicillin: Secondary | ICD-10-CM | POA: Diagnosis not present

## 2020-09-07 DIAGNOSIS — I130 Hypertensive heart and chronic kidney disease with heart failure and stage 1 through stage 4 chronic kidney disease, or unspecified chronic kidney disease: Secondary | ICD-10-CM | POA: Diagnosis not present

## 2020-09-07 DIAGNOSIS — Z87891 Personal history of nicotine dependence: Secondary | ICD-10-CM | POA: Diagnosis not present

## 2020-09-07 DIAGNOSIS — I25110 Atherosclerotic heart disease of native coronary artery with unstable angina pectoris: Secondary | ICD-10-CM | POA: Diagnosis not present

## 2020-09-07 DIAGNOSIS — Z888 Allergy status to other drugs, medicaments and biological substances status: Secondary | ICD-10-CM | POA: Diagnosis not present

## 2020-09-07 DIAGNOSIS — I509 Heart failure, unspecified: Secondary | ICD-10-CM | POA: Diagnosis not present

## 2020-09-07 DIAGNOSIS — E1122 Type 2 diabetes mellitus with diabetic chronic kidney disease: Secondary | ICD-10-CM | POA: Diagnosis not present

## 2020-09-07 DIAGNOSIS — Z79899 Other long term (current) drug therapy: Secondary | ICD-10-CM | POA: Diagnosis not present

## 2020-09-07 DIAGNOSIS — Z7984 Long term (current) use of oral hypoglycemic drugs: Secondary | ICD-10-CM | POA: Diagnosis not present

## 2020-09-07 DIAGNOSIS — I10 Essential (primary) hypertension: Secondary | ICD-10-CM

## 2020-09-07 LAB — BASIC METABOLIC PANEL
Anion gap: 8 (ref 5–15)
BUN: 10 mg/dL (ref 8–23)
CO2: 27 mmol/L (ref 22–32)
Calcium: 9.4 mg/dL (ref 8.9–10.3)
Chloride: 102 mmol/L (ref 98–111)
Creatinine, Ser: 0.92 mg/dL (ref 0.61–1.24)
GFR, Estimated: >60 mL/min (ref >60)
Glucose, Bld: 160 mg/dL — ABNORMAL HIGH (ref 70–99)
Potassium: 3.5 mmol/L (ref 3.5–5.1)
Sodium: 137 mmol/L (ref 135–145)

## 2020-09-07 LAB — POCT ACTIVATED CLOTTING TIME: Activated Clotting Time: 300 seconds

## 2020-09-07 LAB — CBC
HCT: 36.7 % — ABNORMAL LOW (ref 39.0–52.0)
Hemoglobin: 12.8 g/dL — ABNORMAL LOW (ref 13.0–17.0)
MCH: 33 pg (ref 26.0–34.0)
MCHC: 34.9 g/dL (ref 30.0–36.0)
MCV: 94.6 fL (ref 80.0–100.0)
Platelets: 138 10*3/uL — ABNORMAL LOW (ref 150–400)
RBC: 3.88 MIL/uL — ABNORMAL LOW (ref 4.22–5.81)
RDW: 12.6 % (ref 11.5–15.5)
WBC: 8.2 10*3/uL (ref 4.0–10.5)
nRBC: 0 % (ref 0.0–0.2)

## 2020-09-07 MED ORDER — INSULIN GLARGINE-YFGN 100 UNIT/ML ~~LOC~~ SOLN
30.0000 [IU] | Freq: Every day | SUBCUTANEOUS | Status: DC
  Administered 2020-09-07: 30 [IU] via SUBCUTANEOUS
  Filled 2020-09-07: qty 0.3

## 2020-09-07 MED ORDER — ASPIRIN EC 81 MG PO TBEC: 81.0000 mg | DELAYED_RELEASE_TABLET | Freq: Every day | ORAL | 0 refills | Status: AC

## 2020-09-07 MED FILL — Heparin Sodium (Porcine) Inj 1000 Unit/ML: INTRAMUSCULAR | Qty: 10 | Status: AC

## 2020-09-07 MED FILL — Verapamil HCl IV Soln 2.5 MG/ML: INTRAVENOUS | Qty: 2 | Status: AC

## 2020-09-07 NOTE — TOC Benefit Eligibility Note (Signed)
Patient Product/process development scientist completed.    The patient is currently admitted and upon discharge could be taking Farxiga 10 mg.  The current 30 day co-pay is, $0.00.   The patient is currently admitted and upon discharge could be taking Jardiance 10 mg.  The current 30 day co-pay is, $0.00.   The patient is insured through [employer] of [facility]     Roland Earl, CPhT Pharmacy Patient Advocate Specialist [facility] Antimicrobial Stewardship Team Direct Number: [phone]  Fax: [phone]

## 2020-09-07 NOTE — Discharge Summary (Signed)
Discharge Summary    Patient ID: Terry Nelson MRN: [id number]; DOB: 1955-08-10  Admit date: 09/06/2020 Discharge date: 09/07/2020  PCP:  Mila Palmer, PA-C   [facility] Providers Cardiologist:  Sinclair Grooms, MD     Discharge Diagnoses    Principal Problem:   Angina pectoris [facility]) Active Problems:   Hyperlipidemia LDL goal <70   Coronary artery disease involving native coronary artery of native heart with unstable angina pectoris ([place])   Hypertension   PAC (premature atrial contraction)   PAF (paroxysmal atrial fibrillation) ([place])    Diagnostic Studies/Procedures    Cath: 09/06/20   Scoring balloon angioplasty was performed.   Recurrent diffuse in-stent restenosis in the proximal to distal overlapping "full metal jacket" in the LAD.  TIMI grade II-III flow noted.   Overlapping scoring balloon angioplasty from the apical segment to the proximal segment in overlapping fashion reducing ISR to less than 50% proximally and less than 70% distally with TIMI grade III flow.   Widely patent left main   Proximal LAD outside the stented segment and extending to the ostium contains 50 to 60% narrowing   Native circumflex is still widely patent and has competitive flow from vein graft attached to the obtuse marginal   Native RCA and distal stent is widely patent   Saphenous vein graft to the obtuse marginal is widely patent   LIMA is atretic and was not selectively engaged.   Normal LV function and LVEDP   RECOMMENDATIONS: Resume Eliquis in a.m. Aspirin, Eliquis, and Effient for 1 week then drop aspirin Sheath pull at least 2 hours after Angiomax discontinuation. May consider referral for consideration of laser.  The nonexpandable region of overlapping stent in the mid vessel has already been treated with shockwave without improvement.  Diagnostic Dominance: Right Intervention   _____________   History of Present Illness     Terry Nelson is a 64 y.o. male with a  hx of  CABG in 2002 by Dr. Roxan Hockey, overlapping proximal to distal LAD stents with ISR, multiple subsequent balloon angioplasty procedures, PAF with prior ablation, HTN, DM II, HLD, and CKD.     10-day history of recurring chest pain prior to most recent office visit.  Was seen 08/11/2020 by Dr. Gasper Sells because of worsening palpitations and noted at that time some mild episodes of pain.  Discomfort has gotten worse since then.   Did have a day over the past week before his office visit that he used 12 nitroglycerin tablets.  It was told to him by Dr. Tamala Julian he needed to be admitted to the hospital from the last office visit.  He will likely need repeat angioplasty +/- stenting depending upon what is found.  This had been a repetitive theme.  Unfortunately, his LAD contains a full metal jacket and he has not a candidate for repeat surgical intervention in the LAD territory.   He refused admission to the hospital because his wife has multiple sclerosis and was totally dependent upon him.  He request that we make an outpatient appointment for catheterization as soon as possible.  On prior angiography, radial cath has been tried but is very uncomfortable for the patient.  He requests that catheterization be performed from the femoral approach. This was set up.   Hospital Course     Underwent cardiac cath noted above with Dr. Tamala Julian with recurrent diffuse ISR in the p/d overlapping full metal jacket LAD. Successful scoring balloon angioplasty from the apical to proximal  segment in overlapping fashion reducing ISR to less than 50%. SVG to OM patent, LIMA known to be atretic and not engaged. Normal LV function and LVEDP. Received angiomax during and 2 hrs post procedure. Plan for triple therapy with ASA/Effient/Eliquis for total of one week, then stopping ASA with continuation of Eliquis/Effient. No complications noted overnight. Worked with CR without recurrent chest pain. Continued on home medications  without significant change. Of note, he reports being tried on Farxiga in the past but did not tolerate as it caused elevated blood sugars? Will defer DM management to his PCP.   General: Well developed, well nourished, male appearing in no acute distress. Head: Normocephalic, atraumatic.  Neck: Supple without bruits, JVD. Lungs:  Resp regular and unlabored, CTA. Heart: RRR, S1, S2, no S3, S4, or murmur; no rub. Abdomen: Soft, non-tender, non-distended with normoactive bowel sounds. No hepatomegaly. No rebound/guarding. No obvious abdominal masses. Extremities: No clubbing, cyanosis, edema. Distal pedal pulses are 2+ bilaterally. Right femoral cath site stable without bruising or hematoma Neuro: Alert and oriented X 3. Moves all extremities spontaneously. Psych: Normal affect.   Patient was seen by Dr. Ellyn Hack and deemed stable for discharge home. Follow up in the office has been arranged. Medications sent to pharmacy of choice. Educated by PharmD prior to discharge.   Did the patient have an acute coronary syndrome (MI, NSTEMI, STEMI, etc) this admission?:  No                               Did the patient have a percutaneous coronary intervention (stent / angioplasty)?:  Yes.     Cath/PCI Registry Performance & Quality Measures: Aspirin prescribed? - Yes ADP Receptor Inhibitor (Plavix/Clopidogrel, Brilinta/Ticagrelor or Effient/Prasugrel) prescribed (includes medically managed patients)? - Yes High Intensity Statin (Lipitor 40-80mg  or Crestor 20-40mg ) prescribed? - Yes For EF <40%, was ACEI/ARB prescribed? - Not Applicable (EF >/= 28%) For EF <40%, Aldosterone Antagonist (Spironolactone or Eplerenone) prescribed? - Not Applicable (EF >/= 41%) Cardiac Rehab Phase II ordered? - Yes      _____________  Discharge Vitals Blood pressure 125/69, pulse 83, temperature 98.6 F (37 C), temperature source Oral, resp. rate 19, height 5\' 7"  (1.702 m), weight 70.3 kg, SpO2 99 %.  Filed Weights    09/06/20 0908  Weight: 70.3 kg    Labs & Radiologic Studies    CBC Recent Labs    09/06/20 1855 09/07/20 0200  WBC 9.9 8.2  HGB 14.4 12.8*  HCT 41.5 36.7*  MCV 94.3 94.6  PLT 151 324*   Basic Metabolic Panel Recent Labs    09/06/20 1855 09/07/20 0200  NA  --  137  K  --  3.5  CL  --  102  CO2  --  27  GLUCOSE  --  160*  BUN  --  10  CREATININE 0.88 0.92  CALCIUM  --  9.4   Liver Function Tests No results for input(s): AST, ALT, ALKPHOS, BILITOT, PROT, ALBUMIN in the last 72 hours. No results for input(s): LIPASE, AMYLASE in the last 72 hours. High Sensitivity Troponin:   No results for input(s): TROPONINIHS in the last 720 hours.  BNP Invalid input(s): POCBNP D-Dimer No results for input(s): DDIMER in the last 72 hours. Hemoglobin A1C No results for input(s): HGBA1C in the last 72 hours. Fasting Lipid Panel No results for input(s): CHOL, HDL, LDLCALC, TRIG, CHOLHDL, LDLDIRECT in the last 72 hours. Thyroid Function Tests  No results for input(s): TSH, T4TOTAL, T3FREE, THYROIDAB in the last 72 hours.  Invalid input(s): FREET3 _____________  CARDIAC CATHETERIZATION  Result Date: 09/06/2020   Scoring balloon angioplasty was performed.   Recurrent diffuse in-stent restenosis in the proximal to distal overlapping "full metal jacket" in the LAD.  TIMI grade II-III flow noted.   Overlapping scoring balloon angioplasty from the apical segment to the proximal segment in overlapping fashion reducing ISR to less than 50% proximally and less than 70% distally with TIMI grade III flow.   Widely patent left main   Proximal LAD outside the stented segment and extending to the ostium contains 50 to 60% narrowing   Native circumflex is still widely patent and has competitive flow from vein graft attached to the obtuse marginal   Native RCA and distal stent is widely patent   Saphenous vein graft to the obtuse marginal is widely patent   LIMA is atretic and was not selectively engaged.    Normal LV function and LVEDP RECOMMENDATIONS: Resume Eliquis in a.m. Aspirin, Eliquis, and Effient for 1 week then drop aspirin Sheath pull at least 2 hours after Angiomax discontinuation. May consider referral for consideration of laser.  The nonexpandable region of overlapping stent in the mid vessel has already been treated with shockwave without improvement.  Disposition   Pt is being discharged home today in good condition.  Follow-up Plans & Appointments     Follow-up Information     Liliane Shi, PA-C Follow up on 10/03/2020.   Specialties: Cardiology, Physician Assistant Why: at 8:45am for your follow up appt with Dr. Thompson Caul PA Nicki Reaper Contact information: [address] [place] [address] [phone]                Discharge Instructions     Amb Referral to Cardiac Rehabilitation   Complete by: As directed    To [place]   Diagnosis: PTCA   After initial evaluation and assessments completed: Virtual Based Care may be provided alone or in conjunction with Phase 2 Cardiac Rehab based on patient barriers.: Yes   Call MD for:  difficulty breathing, headache or visual disturbances   Complete by: As directed    Call MD for:  persistant dizziness or light-headedness   Complete by: As directed    Call MD for:  redness, tenderness, or signs of infection (pain, swelling, redness, odor or green/yellow discharge around incision site)   Complete by: As directed    Diet - low sodium heart healthy   Complete by: As directed    Discharge instructions   Complete by: As directed    Groin Site Care Refer to this sheet in the next few weeks. These instructions provide you with information on caring for yourself after your procedure. Your caregiver may also give you more specific instructions. Your treatment has been planned according to current medical practices, but problems sometimes occur. Call your caregiver if you have any problems or questions after your  procedure. HOME CARE INSTRUCTIONS You may shower 24 hours after the procedure. Remove the bandage (dressing) and gently wash the site with plain soap and water. Gently pat the site dry.  Do not apply powder or lotion to the site.  Do not sit in a bathtub, swimming pool, or whirlpool for 5 to 7 days.  No bending, squatting, or lifting anything over 10 pounds (4.5 kg) as directed by your caregiver.  Inspect the site at least twice daily.  Do not drive  home if you are discharged the same day of the procedure. Have someone else drive you.  You may drive 24 hours after the procedure unless otherwise instructed by your caregiver.  What to expect: Any bruising will usually fade within 1 to 2 weeks.  Blood that collects in the tissue (hematoma) may be painful to the touch. It should usually decrease in size and tenderness within 1 to 2 weeks.  SEEK IMMEDIATE MEDICAL CARE IF: You have unusual pain at the groin site or down the affected leg.  You have redness, warmth, swelling, or pain at the groin site.  You have drainage (other than a small amount of blood on the dressing).  You have chills.  You have a fever or persistent symptoms for more than 72 hours.  You have a fever and your symptoms suddenly get worse.  Your leg becomes pale, cool, tingly, or numb.  You have heavy bleeding from the site. Hold pressure on the site. .   Increase activity slowly   Complete by: As directed        Discharge Medications   Allergies as of 09/07/2020       Reactions   Metoclopramide Other (See Comments)   DYSKINESIAS   Penicillins Swelling, Rash   Did it involve swelling of the face/tongue/throat, SOB, or low BP? Yes Did it involve sudden or severe rash/hives, skin peeling, or any reaction on the inside of your mouth or nose? Unknown Did you need to seek medical attention at a hospital or doctor's office? Yes When did it last happen?More than 40 years ago    If all above answers are "NO", may proceed  with cephalosporin use.        Medication List     TAKE these medications    amLODipine 10 MG tablet Commonly known as: NORVASC Take 10 mg by mouth daily.   apixaban 5 MG Tabs tablet Commonly known as: Eliquis Take 1 tablet (5 mg total) by mouth 2 (two) times daily.   aspirin EC 81 MG tablet Take 1 tablet (81 mg total) by mouth daily for 6 days. Swallow whole.   atorvastatin 80 MG tablet Commonly known as: LIPITOR Take 80 mg by mouth at bedtime.   BD Pen Needle Nano U/F 32G X 4 MM Misc Generic drug: Insulin Pen Needle USE ONCE AS DIRECTED   BLINK TEARS OP Place 1 drop into both eyes 2 (two) times daily as needed (dry eyes).   Contour Next Monitor w/Device Kit daily.   Contour Next Test test strip Generic drug: glucose blood by Does not apply route.   furosemide 40 MG tablet Commonly known as: LASIX Take 20 mg by mouth daily.   gabapentin 300 MG capsule Commonly known as: NEURONTIN Take 900-1,200 mg by mouth See admin instructions. Take 900 mg by mouth in the morning and 1200 mg at bedtime   glipiZIDE 10 MG 24 hr tablet Commonly known as: GLUCOTROL XL Take 10 mg by mouth at bedtime.   isosorbide mononitrate 60 MG 24 hr tablet Commonly known as: IMDUR Take 120 mg by mouth daily.   metoprolol 200 MG 24 hr tablet Commonly known as: Toprol XL Take 1 tablet (200 mg total) by mouth daily.   nitroGLYCERIN 0.4 MG SL tablet Commonly known as: NITROSTAT Place 1 tablet (0.4 mg total) under the tongue every 5 (five) minutes as needed for chest pain.   oxyCODONE-acetaminophen 10-325 MG tablet Commonly known as: PERCOCET Take 1 tablet by mouth every 4 (  four) hours as needed for pain.   pantoprazole 40 MG tablet Commonly known as: PROTONIX Take 40 mg by mouth daily.   prasugrel 10 MG Tabs tablet Commonly known as: EFFIENT Take 10 mg by mouth daily.   promethazine 25 MG tablet Commonly known as: PHENERGAN Take 25 mg by mouth 2 (two) times daily as needed  for nausea or vomiting.   spironolactone 25 MG tablet Commonly known as: ALDACTONE Take 25 mg by mouth daily.   tiZANidine 4 MG tablet Commonly known as: ZANAFLEX Take 4-8 mg by mouth See admin instructions. Take 4 mg by mouth in the morning, afternoon and dinner and 8 mg bedtime   Toujeo SoloStar 300 UNIT/ML Solostar Pen Generic drug: insulin glargine (1 Unit Dial) Inject 30 Units into the skin daily.   traZODone 50 MG tablet Commonly known as: DESYREL Take 50 mg by mouth at bedtime as needed for sleep.           Outstanding Labs/Studies   N/a   Duration of Discharge Encounter   Greater than 30 minutes including physician time.  Signed, Reino Bellis, NP 09/07/2020, 11:59 AM

## 2020-09-07 NOTE — Progress Notes (Signed)
CARDIAC REHAB PHASE I   PRE:  Rate/Rhythm: 68 SR    BP: sitting 121/61    SaO2:   MODE:  Ambulation: 470 ft   POST:  Rate/Rhythm: 82 SR    BP: sitting 136/76     SaO2:   Tolerated well at slow pace, no major c/o. Denied CP. A little sore in groin. Reviewed education. Pt denied diet. Encouraged exercise. Will place CRPII referral for [place] however pt is not interested given he cares for his wife.  [id number]   Harriet Masson CES, ACSM 09/07/2020 9:03 AM

## 2020-09-09 ENCOUNTER — Encounter (HOSPITAL_COMMUNITY): Payer: Self-pay

## 2020-09-09 ENCOUNTER — Other Ambulatory Visit (HOSPITAL_COMMUNITY): Payer: BC Managed Care – PPO

## 2020-09-13 ENCOUNTER — Telehealth: Payer: Self-pay | Admitting: Interventional Cardiology

## 2020-09-13 ENCOUNTER — Encounter: Payer: Self-pay | Admitting: Cardiology

## 2020-09-13 ENCOUNTER — Encounter (HOSPITAL_COMMUNITY): Payer: Self-pay | Admitting: Internal Medicine

## 2020-09-13 ENCOUNTER — Ambulatory Visit (INDEPENDENT_AMBULATORY_CARE_PROVIDER_SITE_OTHER): Payer: BC Managed Care – PPO | Admitting: Cardiology

## 2020-09-13 ENCOUNTER — Other Ambulatory Visit: Payer: Self-pay

## 2020-09-13 VITALS — BP 110/65 | HR 57 | Ht 67.0 in | Wt 161.0 lb

## 2020-09-13 DIAGNOSIS — I25110 Atherosclerotic heart disease of native coronary artery with unstable angina pectoris: Secondary | ICD-10-CM

## 2020-09-13 DIAGNOSIS — E785 Hyperlipidemia, unspecified: Secondary | ICD-10-CM

## 2020-09-13 DIAGNOSIS — I480 Paroxysmal atrial fibrillation: Secondary | ICD-10-CM

## 2020-09-13 DIAGNOSIS — I10 Essential (primary) hypertension: Secondary | ICD-10-CM

## 2020-09-13 MED ORDER — FUROSEMIDE 40 MG PO TABS: 40.0000 mg | ORAL_TABLET | Freq: Two times a day (BID) | ORAL | 3 refills | Status: DC

## 2020-09-13 NOTE — Patient Instructions (Signed)
Medication Instructions:  Your physician has recommended you make the following change in your medication:  1) INCREASE Lasix (furosemide) to 40 mg twice daily  *If you need a refill on your cardiac medications before your next appointment, please call your pharmacy*   Follow-Up: At [facility], you and your health needs are our priority.  As part of our continuing mission to provide you with exceptional heart care, we have created designated Provider Care Teams.  These Care Teams include your primary Cardiologist (physician) and Advanced Practice Providers (APPs -  Physician Assistants and Nurse Practitioners) who all work together to provide you with the care you need, when you need it.

## 2020-09-13 NOTE — Telephone Encounter (Signed)
Pt c/o swelling: STAT is pt has developed SOB within 24 hours  If swelling, where is the swelling located? Both feet and legs; right is swollen more than left  How much weight have you gained and in what time span? 12 lbs; 148 lbs to 160 lbs in 3 1/2 days  Have you gained 3 pounds in a day or 5 pounds in a week? yes  Do you have a log of your daily weights (if so, list)? Yes   Are you currently taking a fluid pill? Yes   Are you currently SOB? Yes   Have you traveled recently? No

## 2020-09-13 NOTE — Progress Notes (Signed)
Cardiology Office Note:    Date:  09/13/2020   ID:  ISAAH FURRY, DOB 01/04/1956, MRN [id number]  PCP:  Mila Palmer, PA-C  Cardiologist:  Sinclair Grooms, MD   Referring MD: Mila Palmer, PA-C   Chief Complaint  Patient presents with   Follow-up    SOB, LE edema and chest pain s/p recent PCI    History of Present Illness:    Terry Nelson is a 65 y.o. male with a hx of  CABG in 2002 by Dr. Roxan Hockey, overlapping proximal to distal LAD stents with ISR, multiple subsequent balloon angioplasty procedures, PAF with prior ablation, HtN, DM II, HLD, and CKD.  Was seen 08/11/2020 by Dr. Gasper Sells because of worsening palpitations and noted at that time some mild episodes of chest pain.  Was seen by Dr. Tamala Julian after that had his discomfort has gotten worse since then.  Cardiac cath was recommended but patient refused admission because his wife has MS and is dependent on him.  He subsequently underwent outpt cath 09/06/2020 showing chronically atretic LIMA to LAD, patent seq SVG to OM1 and OM2, severe instent restenosis of the full jacket stent throughout the LAD up to 90%, moderate dz of the RCA with 20% ISR of the distal RCA PDA stent.  He underwent overlapping scoring balloon angioplasty from the apical to proximal segment of LAD reducing ISR to < 50% proximally and 70% distally.  LVEDP and LVF were normal at that time.    Today he presents for evaluation of LE edema.  He has had some LE edema in the past but not recently.  He tells me that his feet start swelling Friday evening.  He takes Lasix $RemoveBef'20mg'YGDHDVYUYT$  daily and called his PCP and increased to $RemoveBefo'40mg'vSAGGXqcxUq$  daily for the past 3 days but no improvement in UOP until today.  His LE edema has not improved any despite increased diuresis.  He did not eat any salty food over the weekend.  He does get takeout some but cannot remember what he ate this weekend.  He has had improvement in his chronic angina but since his PCI but today had severe 7/10 CP and had  to take 4-5 SL NTG before it stopped.  Since then no further CP.  He has some chronic DOE but has not had any recently until today and got bad walking the dogs.     Past Medical History:  Diagnosis Date   Anxiety state    Arteriosclerosis of coronary artery    Body mass index (bmi) 26.0-26.9, adult    Chest pain    CHF (congestive heart failure) (HCC)    Chronic renal impairment    DDD (degenerative disc disease), lumbar    DM (diabetes mellitus) ([place])    Gastritis    Headache    Hemorrhage    Hyperlipidemia    Hypertension    Kidney stones    Lumbago    Neck pain    Paroxysmal A-fib (HCC)    Pernicious anemia    Unstable angina ([place])     Past Surgical History:  Procedure Laterality Date   ABLATION  2017   CARDIAC CATHETERIZATION     CORONARY ARTERY BYPASS GRAFT     CORONARY BALLOON ANGIOPLASTY N/A 04/27/2019   Procedure: CORONARY BALLOON ANGIOPLASTY;  Surgeon: Nelva Bush, MD;  Location: [facility];  Service: Cardiovascular;  Laterality: N/A;   CORONARY BALLOON ANGIOPLASTY N/A 12/16/2019   Procedure: CORONARY BALLOON ANGIOPLASTY;  Surgeon: Daneen Schick  W, MD;  Location: [facility];  Service: Cardiovascular;  Laterality: N/A;   CORONARY BALLOON ANGIOPLASTY N/A 09/06/2020   Procedure: CORONARY BALLOON ANGIOPLASTY;  Surgeon: Belva Crome, MD;  Location: [facility];  Service: Cardiovascular;  Laterality: N/A;   INTRAVASCULAR ULTRASOUND/IVUS N/A 04/27/2019   Procedure: Intravascular Ultrasound/IVUS;  Surgeon: Nelva Bush, MD;  Location: [facility];  Service: Cardiovascular;  Laterality: N/A;   INTRAVASCULAR ULTRASOUND/IVUS N/A 12/16/2019   Procedure: Intravascular Ultrasound/IVUS;  Surgeon: Belva Crome, MD;  Location: [facility];  Service: Cardiovascular;  Laterality: N/A;   LEFT HEART CATH AND CORS/GRAFTS ANGIOGRAPHY N/A 04/09/2019   Procedure: LEFT HEART CATH AND CORS/GRAFTS ANGIOGRAPHY;  Surgeon: Belva Crome, MD;  Location:  [facility];  Service: Cardiovascular;  Laterality: N/A;   LEFT HEART CATH AND CORS/GRAFTS ANGIOGRAPHY N/A 12/16/2019   Procedure: LEFT HEART CATH AND CORS/GRAFTS ANGIOGRAPHY;  Surgeon: Belva Crome, MD;  Location: [facility];  Service: Cardiovascular;  Laterality: N/A;   LEFT HEART CATH AND CORS/GRAFTS ANGIOGRAPHY N/A 09/06/2020   Procedure: LEFT HEART CATH AND CORS/GRAFTS ANGIOGRAPHY;  Surgeon: Belva Crome, MD;  Location: [facility];  Service: Cardiovascular;  Laterality: N/A;    Current Medications: Current Meds  Medication Sig   amLODipine (NORVASC) 10 MG tablet Take 10 mg by mouth daily.   apixaban (ELIQUIS) 5 MG TABS tablet Take 1 tablet (5 mg total) by mouth 2 (two) times daily.   aspirin EC 81 MG tablet Take 1 tablet (81 mg total) by mouth daily for 6 days. Swallow whole.   atorvastatin (LIPITOR) 80 MG tablet Take 80 mg by mouth at bedtime.   Blood Glucose Monitoring Suppl (CONTOUR NEXT MONITOR) w/Device KIT daily.   furosemide (LASIX) 40 MG tablet Take 1 tablet (40 mg total) by mouth 2 (two) times daily.   gabapentin (NEURONTIN) 300 MG capsule Take 900-1,200 mg by mouth See admin instructions. Take 900 mg by mouth in the morning and 1200 mg at bedtime   glipiZIDE (GLUCOTROL XL) 10 MG 24 hr tablet Take 10 mg by mouth at bedtime.   glucose blood (CONTOUR NEXT TEST) test strip by Does not apply route.   Insulin Pen Needle (BD PEN NEEDLE NANO U/F) 32G X 4 MM MISC USE ONCE AS DIRECTED   isosorbide mononitrate (IMDUR) 60 MG 24 hr tablet Take 120 mg by mouth daily.   metoprolol (TOPROL XL) 200 MG 24 hr tablet Take 1 tablet (200 mg total) by mouth daily.   nitroGLYCERIN (NITROSTAT) 0.4 MG SL tablet Place 1 tablet (0.4 mg total) under the tongue every 5 (five) minutes as needed for chest pain.   oxyCODONE-acetaminophen (PERCOCET) 10-325 MG tablet Take 1 tablet by mouth every 4 (four) hours as needed for pain.   pantoprazole (PROTONIX) 40 MG tablet Take 40 mg by mouth  daily.   Polyethylene Glycol 400 (BLINK TEARS OP) Place 1 drop into both eyes 2 (two) times daily as needed (dry eyes).   prasugrel (EFFIENT) 10 MG TABS tablet Take 10 mg by mouth daily.   promethazine (PHENERGAN) 25 MG tablet Take 25 mg by mouth 2 (two) times daily as needed for nausea or vomiting.   spironolactone (ALDACTONE) 25 MG tablet Take 25 mg by mouth daily.   tiZANidine (ZANAFLEX) 4 MG tablet Take 4-8 mg by mouth See admin instructions. Take 4 mg by mouth in the morning, afternoon and dinner and 8 mg bedtime   TOUJEO  SOLOSTAR 300 UNIT/ML Solostar Pen Inject 30 Units into the skin daily.   traZODone (DESYREL) 50 MG tablet Take 50 mg by mouth at bedtime as needed for sleep.   [DISCONTINUED] furosemide (LASIX) 40 MG tablet Take 20 mg by mouth daily.      Allergies:   Metoclopramide and Penicillins   Social History   Socioeconomic History   Marital status: Married    Spouse name: Not on file   Number of children: Not on file   Years of education: Not on file   Highest education level: Not on file  Occupational History   Not on file  Tobacco Use   Smoking status: Never   Smokeless tobacco: Former    Types: Chew   Tobacco comments:    per pt chewed tobacco in high school   Vaping Use   Vaping Use: Never used  Substance and Sexual Activity   Alcohol use: Never   Drug use: Never   Sexual activity: Not on file    Comment: MARRIED  Other Topics Concern   Not on file  Social History Narrative   Not on file   Social Determinants of Health   Financial Resource Strain: Not on file  Food Insecurity: Not on file  Transportation Needs: Not on file  Physical Activity: Not on file  Stress: Not on file  Social Connections: Not on file     Family History: The patient's family history includes Diabetes in his father; Heart disease in an other family member; High blood pressure in his mother; Stroke in his mother.  ROS:   Please see the history of present illness.    Not  currently having chest discomfort.    All other systems reviewed and are negative.  EKGs/Labs/Other Studies Reviewed:    The following studies were reviewed today:  Continuous monitor completed 07/02/2020: Study Highlights  Normal sinus rhythm Rare 4 beat VT salvo SVT salvos up to 20 beats No atrial fibrillation or sustained arrhythmia.   EKG:  EKG Normal sinus rhythm, borderline short PR interval, nonspecific T wave flattening diffuse.  Biatrial abnormality.  No acute change when compared to prior.  Recent Labs: 09/07/2020: BUN 10; Creatinine, Ser 0.92; Hemoglobin 12.8; Platelets 138; Potassium 3.5; Sodium 137  Recent Lipid Panel    Component Value Date/Time   CHOL 136 03/30/2019 1234   TRIG 178 (H) 03/30/2019 1234   HDL 36 (L) 03/30/2019 1234   CHOLHDL 3.8 03/30/2019 1234   LDLCALC 70 03/30/2019 1234    Physical Exam:    VS:  BP 110/65   Pulse (!) 57   Ht $R'5\' 7"'Qi$  (1.702 m)   Wt 161 lb (73 kg)   SpO2 96%   BMI 25.22 kg/m     Wt Readings from Last 3 Encounters:  09/13/20 161 lb (73 kg)  09/06/20 155 lb (70.3 kg)  09/02/20 155 lb (70.3 kg)     GEN: Well nourished, well developed in no acute distress HEENT: Normal NECK: No JVD; No carotid bruits LYMPHATICS: No lymphadenopathy CARDIAC:RRR, no murmurs, rubs, gallops RESPIRATORY:  Clear to auscultation without rales, wheezing or rhonchi  ABDOMEN: Soft, non-tender, non-distended MUSCULOSKELETAL:  2+ ankle and pedal  edema; No deformity  SKIN: Warm and dry NEUROLOGIC:  Alert and oriented x 3 PSYCHIATRIC:  Normal affect    ASSESSMENT:    1. Hyperlipidemia LDL goal <70   2. Primary hypertension   3. PAF (paroxysmal atrial fibrillation) ([place])   4. Coronary artery disease involving native coronary  artery of native heart with unstable angina pectoris (HCC)     PLAN:    In order of problems listed above:  Unstable angina -patient is s/p overlapping scoring balloon angioplasty from the apical to proximal segment of  LAD reducing ISR to < 50% proximally and 70% distally. -he did not have any SOB or CP post PCI until today when he had a severe episode of CP requiring 5 SL NTG for relief -he has been having SOB over the weekend with minimal exertion and now new LE edema unresponsive to increased doses of diuretics -EKG shows nonspecific T wave changes actually improved from prior EKG on 09/07/20 -doubt that angioplasty closed down with no new EKG changes -I have recommended that we admit him to tele bed for further assessment with serial EKG and trop but he refuses admission today and tells me he needs to make arrangements for the care of his wife before coming I and wants to come in tomorrow -I have discussed the risks of waiting until tomorrow including risk of acute MI or death and he understands and accepts the risk -I will arrange for a tele bed in am and get serial Trops -check 2D echo in am to assess LVF -will let Dr. Tamala Julian know of his admission  2.  Acute diastolic CHF -he is now having severe DOE with minimal exertion and LE edema resistant to increased diuretics -his lungs are clear but he has new LE edema -LVF was normal at cath last week but he has not had any echo recently -will increase Lasix to 40mg  BID for now -check BNP, CMET in am -reassess in am> may need several doses of IV lasix (apparently cannot take demedex longterm due to issue with his kidneys) -check 2D echo  3.  ASCAD -he has an extensive hx of CAD with CABG in 2002 by Dr. Roxan Hockey, overlapping proximal to distal LAD stents with ISR, multiple subsequent balloon angioplasty procedures, the last being a week ago when he had balloon sculpting angioplasty to the entire 'LAD metal jacket" for ISR with proximal portion reduced to < 50% and distal < 70% -continue amlodipine 10mg  daily, ASA 81mg  daily, Imdur 120mg  daily, Toprol XL 200mg  daily, Effient 10mg  daily and high dose statin -not sure he will need repeat cath so will not hold  Eliquis until we seen results of labs and Echo  4.  HTN -BP controlled -continue prescription drug management with Amlodipine 10mg  daily, Toprol XL 200mg  daily, Imdur 120mg  daily, spiro 25mg  daily  5.  PAF -maintaining NSR on exam and EKG -continue Eliquis 5mg  BID and Toprol  I have spent a total of 60 minutes with patient reviewing cardiac cath from 09/06/2020, EKGs, labs and examining patient as well as establishing an assessment and plan that was discussed with the patient.  > 50% of time was spent in direct patient care.      Medication Adjustments/Labs and Tests Ordered: Current medicines are reviewed at length with the patient today.  Concerns regarding medicines are outlined above.  Orders Placed This Encounter  Procedures   EKG 12-Lead     Meds ordered this encounter  Medications   furosemide (LASIX) 40 MG tablet    Sig: Take 1 tablet (40 mg total) by mouth 2 (two) times daily.    Dispense:  90 tablet    Refill:  3     Patient Instructions  Medication Instructions:  Your physician has recommended you make the following change in your medication:  1) INCREASE Lasix (furosemide) to 40 mg twice daily  *If you need a refill on your cardiac medications before your next appointment, please call your pharmacy*   Follow-Up: At [facility], you and your health needs are our priority.  As part of our continuing mission to provide you with exceptional heart care, we have created designated Provider Care Teams.  These Care Teams include your primary Cardiologist (physician) and Advanced Practice Providers (APPs -  Physician Assistants and Nurse Practitioners) who all work together to provide you with the care you need, when you need it.    Signed, Fransico Him, MD  09/13/2020 4:22 PM    [place]

## 2020-09-13 NOTE — Telephone Encounter (Signed)
Called and spoke to the patient. He states that since Friday he has had increased swelling in both of his legs with right being greater than left. He denies pain in his legs but said that they feel like they are going to pop. He states that he has gained 12 lbs since Friday. He states that he was 148 lbs and that he has been 160 lbs for the last 2 days. He states that he started to feel SOB this morning. He was taking lasix 20 mg QD and his PCP advised that he increase to 40 mg QD. He has done this for the last 2 days with no improvement in his swelling or weight gain. He is still taking spironolactone 25 mg QD and amlodipine 10 mg QD. BP 117/71 HR 55. He states that he has been elevating his legs and denies increased salt intake. Patient with recent cath on 8/30. He states groin site looks good. Given his recent weight gain, swelling, and SOB and the fact that his recent increase of lasix has not helped I have offered him an appointment today with Dr. Mayford Knife for further evaluation and recommendation. Patient accepts appointment today at 3:30 PM.

## 2020-09-13 NOTE — Telephone Encounter (Signed)
Called triage for STAT call; Per Pam, will call patient back

## 2020-09-14 ENCOUNTER — Inpatient Hospital Stay (HOSPITAL_COMMUNITY): Admission: RE | Admit: 2020-09-14 | Payer: BC Managed Care – PPO | Source: Ambulatory Visit | Admitting: Cardiology

## 2020-09-14 ENCOUNTER — Telehealth: Payer: Self-pay | Admitting: Cardiology

## 2020-09-14 DIAGNOSIS — I10 Essential (primary) hypertension: Secondary | ICD-10-CM

## 2020-09-14 NOTE — Telephone Encounter (Signed)
Okay to continue furosemide. BMET on Friday or Monday. Call if gets worse again.

## 2020-09-14 NOTE — Telephone Encounter (Signed)
Spoke with the patient and he will come in on Friday for BMET. He is aware to call us if symptoms worsen.

## 2020-09-14 NOTE — Telephone Encounter (Signed)
Spoke with the patient who saw Dr. Mayford Knife yesterday as an add on for DOD. His lasix was increased to 40 mg BID. Plan was for him to be admitted to the hospital this morning once a bed became available.  Patient says that he woke up this morning feeling much better. He reports that has lost about 5 lbs since yesterday. He had a lot of urine output overnight. He reports he is not having any shortness of breath. He denies any chest pain. He states swelling in his legs and feet has decreased. He is currently outside walking with his dogs and feels good.  Patient does not want to be admitted to the hospital today. He does not feel that he needs to be inpatient.  Spoke with Dr. Mayford Knife who would like to defer this decision to Dr. Katrinka Blazing.

## 2020-09-14 NOTE — Telephone Encounter (Signed)
Pt is returning call from earlier today! 

## 2020-09-14 NOTE — Addendum Note (Signed)
Addended by: Theresia Majors on: 09/14/2020 12:34 PM   Modules accepted: Orders

## 2020-09-14 NOTE — Telephone Encounter (Signed)
Pt c/o medication issue:  1. Name of Medication: furosemide (LASIX) 40 MG tablet  2. How are you currently taking this medication (dosage and times per day)? As directed   3. Are you having a reaction (difficulty breathing--STAT)? no  4. What is your medication issue? Patient states that taking the extra lasix made a tremendous difference. He doesn't feel the need to go to the hospital today. He has lost 5 pounds and his ankles are not swelling anymore.

## 2020-09-16 ENCOUNTER — Other Ambulatory Visit: Payer: BC Managed Care – PPO | Admitting: *Deleted

## 2020-09-16 ENCOUNTER — Other Ambulatory Visit: Payer: Self-pay

## 2020-09-16 DIAGNOSIS — I10 Essential (primary) hypertension: Secondary | ICD-10-CM

## 2020-09-17 LAB — BASIC METABOLIC PANEL
BUN/Creatinine Ratio: 17 (ref 10–24)
BUN: 19 mg/dL (ref 8–27)
CO2: 28 mmol/L (ref 20–29)
Calcium: 10.3 mg/dL — ABNORMAL HIGH (ref 8.6–10.2)
Chloride: 94 mmol/L — ABNORMAL LOW (ref 96–106)
Creatinine, Ser: 1.15 mg/dL (ref 0.76–1.27)
Glucose: 101 mg/dL — ABNORMAL HIGH (ref 65–99)
Potassium: 4 mmol/L (ref 3.5–5.2)
Sodium: 139 mmol/L (ref 134–144)
eGFR: 72 mL/min/{1.73_m2} (ref >59)

## 2020-09-21 ENCOUNTER — Telehealth: Payer: Self-pay | Admitting: Interventional Cardiology

## 2020-09-21 NOTE — Telephone Encounter (Signed)
While speaking with pt about lab results he mentioned that he has had to take several Nitro since leaving the hospital.  Terry Nelson had to do a lot of moving around and had to take 10-12 Nitro throughout the day which relieved the pain.  Pain has been much better today.  Pt currently on Imdur 120mg  QD.  Spoke with Dr. and he said to have pt continue taking Nitro as needed and keep upcoming appt with Katrinka Blazing, PA-C. Spoke with pt and made him aware.  He is aware when it is appropriate to return to the ED.  Advised to call sooner if any changes.  Pt agreeable to plan.

## 2020-09-27 ENCOUNTER — Telehealth (HOSPITAL_COMMUNITY): Payer: Self-pay | Admitting: Internal Medicine

## 2020-09-27 ENCOUNTER — Telehealth: Payer: Self-pay | Admitting: Interventional Cardiology

## 2020-09-27 NOTE — Telephone Encounter (Signed)
He needs to be back in hospital ASAP to get repeat cath. He has ISR and there is probably not much more we can do.

## 2020-09-27 NOTE — Telephone Encounter (Signed)
Just an FYI. We have made several attempts to contact this patient including sending a letter to schedule or reschedule their echocardiogram. We will be removing the patient from the echo WQ.  09/09/20 NO SHOWED-MAILED LETTER [facility]     Thank you

## 2020-09-27 NOTE — Telephone Encounter (Signed)
Pt states he has been having intermittent CP since about 4 days after leaving the hospital on 8/31.  Seen Dr. Mayford Knife on 9/6 and was set up for admission but called back and cancelled because he felt better.  States he has taken 10 Nitro today to help alleviate the pain but it continues to come back.  BP was 170s/98 when he checked it earlier.  Only other symptom is nausea.  States the pain doesn't feel like it did when he had his previous MI.  Advised pt he really needed to call EMS and go to ER for eval as there is concern stent possibly clotted.  Pt states he can't go because he has to take care of his wife and there is no one to come and help him.  Spoke about concerns for his symptoms and what could be going on and pt understood but still declined going to the hospital right now.  Asked pt to at minimum call EMS and have them come out and evaluate him.  Pt stated if things worsened he would do that.  Scheduled pt to come in Thursday and see Dr. Katrinka Blazing.  Begged pt to call EMS so they could at least hook him up to an EKG and he promised that he would.  Will route to Dr. Katrinka Blazing to make him aware.

## 2020-09-27 NOTE — Telephone Encounter (Signed)
Pt c/o of Chest Pain: STAT if CP now or developed within 24 hours  1. Are you having CP right now? yes  2. Are you experiencing any other symptoms (ex. SOB, nausea, vomiting, sweating)? nausea  3. How long have you been experiencing CP? About 4 days after he came home from Cath  4. Is your CP continuous or coming and going? Continuous but stops with nitroglycerin  5. Have you taken Nitroglycerin? Yes, take    Patient states he is having active chest pain. He says he does have stents in and is not sure if his medications need to be adjusted. He states he tries to be as still as possible and takes nitroglycerin, but it does come back.   ?

## 2020-09-28 NOTE — Progress Notes (Deleted)
Cardiology Office Note:    Date:  09/28/2020   ID:  Terry Nelson, DOB 1955/03/21, MRN [id number]  PCP:  Jiles Harold, PA-C  Cardiologist:  Lesleigh Noe, MD   Referring MD: Jiles Harold, PA-C   No chief complaint on file.   History of Present Illness:    Terry Nelson is a 65 y.o. male with a hx of CABG in 2002 by Dr. Dorris Fetch, overlapping proximal to distal LAD stents with ISR, multiple subsequent balloon angioplasty procedures (most recent 04/09/19->04/27/2019->12/16/2019-> 09/06/2020), PAF with prior ablation, HtN, DM II, HLD, and CKD. Acute CHF after most recent PCI 09/06/2020.   ***  Past Medical History:  Diagnosis Date   Anxiety state    Arteriosclerosis of coronary artery    Body mass index (bmi) 26.0-26.9, adult    Chest pain    CHF (congestive heart failure) (HCC)    Chronic renal impairment    DDD (degenerative disc disease), lumbar    DM (diabetes mellitus) (HCC)    Gastritis    Headache    Hemorrhage    Hyperlipidemia    Hypertension    Kidney stones    Lumbago    Neck pain    Paroxysmal A-fib (HCC)    Pernicious anemia    Unstable angina (HCC)     Past Surgical History:  Procedure Laterality Date   ABLATION  2017   CARDIAC CATHETERIZATION     CORONARY ARTERY BYPASS GRAFT     CORONARY BALLOON ANGIOPLASTY N/A 04/27/2019   Procedure: CORONARY BALLOON ANGIOPLASTY;  Surgeon: Yvonne Kendall, MD;  [facility];  Service: Cardiovascular;  Laterality: N/A;   CORONARY BALLOON ANGIOPLASTY N/A 12/16/2019   Procedure: CORONARY BALLOON ANGIOPLASTY;  Surgeon: Lyn Records, MD;  Location: [facility];  Service: Cardiovascular;  Laterality: N/A;   CORONARY BALLOON ANGIOPLASTY N/A 09/06/2020   Procedure: CORONARY BALLOON ANGIOPLASTY;  Surgeon: Lyn Records, MD;  Location: [facility];  Service: Cardiovascular;  Laterality: N/A;   INTRAVASCULAR ULTRASOUND/IVUS N/A 04/27/2019   Procedure: Intravascular Ultrasound/IVUS;  Surgeon:  Yvonne Kendall, MD;  [facility];  Service: Cardiovascular;  Laterality: N/A;   INTRAVASCULAR ULTRASOUND/IVUS N/A 12/16/2019   Procedure: Intravascular Ultrasound/IVUS;  Surgeon: Lyn Records, MD;  Location: [facility];  Service: Cardiovascular;  Laterality: N/A;   LEFT HEART CATH AND CORS/GRAFTS ANGIOGRAPHY N/A 04/09/2019   Procedure: LEFT HEART CATH AND CORS/GRAFTS ANGIOGRAPHY;  Surgeon: Lyn Records, MD;  [facility]: [facility];  Service: Cardiovascular;  Laterality: N/A;   LEFT HEART CATH AND CORS/GRAFTS ANGIOGRAPHY N/A 12/16/2019   Procedure: LEFT HEART CATH AND CORS/GRAFTS ANGIOGRAPHY;  Surgeon: Lyn Records, MD;  [facility];  Service: Cardiovascular;  Laterality: N/A;   LEFT HEART CATH AND CORS/GRAFTS ANGIOGRAPHY N/A 09/06/2020   Procedure: LEFT HEART CATH AND CORS/GRAFTS ANGIOGRAPHY;  Surgeon: Lyn Records, MD;  [facility]: [facility];  Service: Cardiovascular;  Laterality: N/A;    Current Medications: No outpatient medications have been marked as taking for the 09/29/20 encounter (Appointment) with Lyn Records, MD.     Allergies:   Metoclopramide and Penicillins   Social History   Socioeconomic History   Marital status: Married    Spouse name: Not on file   Number of children: Not on file   Years of education: Not on file   Highest education level: Not on file  Occupational History   Not on  file  Tobacco Use   Smoking status: Never   Smokeless tobacco: Former    Types: Chew   Tobacco comments:    per pt chewed tobacco in high school   Vaping Use   Vaping Use: Never used  Substance and Sexual Activity   Alcohol use: Never   Drug use: Never   Sexual activity: Not on file    Comment: MARRIED  Other Topics Concern   Not on file  Social History Narrative   Not on file   Social Determinants of Health   Financial Resource Strain: Not on file  Food Insecurity: Not on file  Transportation Needs: Not on file   Physical Activity: Not on file  Stress: Not on file  Social Connections: Not on file     Family History: The patient's family history includes Diabetes in his father; Heart disease in an other family member; High blood pressure in his mother; Stroke in his mother.  ROS:   Please see the history of present illness.    *** All other systems reviewed and are negative.  EKGs/Labs/Other Studies Reviewed:    The following studies were reviewed today: ***  EKG:  EKG ***  Recent Labs: 09/07/2020: Hemoglobin 12.8; Platelets 138 09/16/2020: BUN 19; Creatinine, Ser 1.15; Potassium 4.0; Sodium 139  Recent Lipid Panel    Component Value Date/Time   CHOL 136 03/30/2019 1234   TRIG 178 (H) 03/30/2019 1234   HDL 36 (L) 03/30/2019 1234   CHOLHDL 3.8 03/30/2019 1234   LDLCALC 70 03/30/2019 1234    Physical Exam:    VS:  There were no vitals taken for this visit.    Wt Readings from Last 3 Encounters:  09/13/20 161 lb (73 kg)  09/06/20 155 lb (70.3 kg)  09/02/20 155 lb (70.3 kg)     GEN: ***. No acute distress HEENT: Normal NECK: No JVD. LYMPHATICS: No lymphadenopathy CARDIAC: *** murmur. RRR *** gallop, or edema. VASCULAR: *** Normal Pulses. No bruits. RESPIRATORY:  Clear to auscultation without rales, wheezing or rhonchi  ABDOMEN: Soft, non-tender, non-distended, No pulsatile mass, MUSCULOSKELETAL: No deformity  SKIN: Warm and dry NEUROLOGIC:  Alert and oriented x 3 PSYCHIATRIC:  Normal affect   ASSESSMENT:    1. Coronary artery disease involving native coronary artery of native heart with unstable angina pectoris (HCC)   2. Hyperlipidemia LDL goal <70   3. PAF (paroxysmal atrial fibrillation) (HCC)   4. Primary hypertension   5. Controlled type 2 diabetes mellitus with other circulatory complication, without long-term current use of insulin (HCC)   6. Essential hypertension   7. Chronic anticoagulation    PLAN:    In order of problems listed  above:  ***   Medication Adjustments/Labs and Tests Ordered: Current medicines are reviewed at length with the patient today.  Concerns regarding medicines are outlined above.  No orders of the defined types were placed in this encounter.  No orders of the defined types were placed in this encounter.   There are no Patient Instructions on file for this visit.   Signed, Lesleigh Noe, MD  09/28/2020 5:53 PM    [facility]

## 2020-09-29 ENCOUNTER — Ambulatory Visit: Payer: BC Managed Care – PPO | Admitting: Interventional Cardiology

## 2020-09-29 ENCOUNTER — Observation Stay (HOSPITAL_COMMUNITY): Payer: BC Managed Care – PPO

## 2020-09-29 ENCOUNTER — Observation Stay (HOSPITAL_COMMUNITY)
Admission: EM | Admit: 2020-09-29 | Discharge: 2020-10-01 | Disposition: A | Discharge status: Home / Self Care | Payer: BC Managed Care – PPO | Attending: Internal Medicine | Admitting: Internal Medicine

## 2020-09-29 ENCOUNTER — Emergency Department (HOSPITAL_COMMUNITY): Payer: BC Managed Care – PPO

## 2020-09-29 ENCOUNTER — Other Ambulatory Visit: Payer: Self-pay

## 2020-09-29 ENCOUNTER — Encounter (HOSPITAL_COMMUNITY): Payer: Self-pay | Admitting: Emergency Medicine

## 2020-09-29 ENCOUNTER — Encounter: Payer: Self-pay | Admitting: Interventional Cardiology

## 2020-09-29 DIAGNOSIS — Z79899 Other long term (current) drug therapy: Secondary | ICD-10-CM | POA: Diagnosis not present

## 2020-09-29 DIAGNOSIS — R0789 Other chest pain: Secondary | ICD-10-CM | POA: Diagnosis not present

## 2020-09-29 DIAGNOSIS — I110 Hypertensive heart disease with heart failure: Secondary | ICD-10-CM | POA: Insufficient documentation

## 2020-09-29 DIAGNOSIS — I509 Heart failure, unspecified: Secondary | ICD-10-CM | POA: Insufficient documentation

## 2020-09-29 DIAGNOSIS — I480 Paroxysmal atrial fibrillation: Secondary | ICD-10-CM | POA: Diagnosis present

## 2020-09-29 DIAGNOSIS — I25110 Atherosclerotic heart disease of native coronary artery with unstable angina pectoris: Secondary | ICD-10-CM | POA: Diagnosis not present

## 2020-09-29 DIAGNOSIS — I200 Unstable angina: Secondary | ICD-10-CM | POA: Diagnosis not present

## 2020-09-29 DIAGNOSIS — E119 Type 2 diabetes mellitus without complications: Secondary | ICD-10-CM | POA: Diagnosis not present

## 2020-09-29 DIAGNOSIS — Z951 Presence of aortocoronary bypass graft: Secondary | ICD-10-CM | POA: Insufficient documentation

## 2020-09-29 DIAGNOSIS — E1159 Type 2 diabetes mellitus with other circulatory complications: Secondary | ICD-10-CM

## 2020-09-29 DIAGNOSIS — I10 Essential (primary) hypertension: Secondary | ICD-10-CM

## 2020-09-29 DIAGNOSIS — R079 Chest pain, unspecified: Secondary | ICD-10-CM | POA: Diagnosis not present

## 2020-09-29 DIAGNOSIS — Z794 Long term (current) use of insulin: Secondary | ICD-10-CM | POA: Insufficient documentation

## 2020-09-29 DIAGNOSIS — Z20822 Contact with and (suspected) exposure to covid-19: Secondary | ICD-10-CM | POA: Diagnosis not present

## 2020-09-29 DIAGNOSIS — Z7901 Long term (current) use of anticoagulants: Secondary | ICD-10-CM

## 2020-09-29 DIAGNOSIS — Z7984 Long term (current) use of oral hypoglycemic drugs: Secondary | ICD-10-CM | POA: Insufficient documentation

## 2020-09-29 DIAGNOSIS — E785 Hyperlipidemia, unspecified: Secondary | ICD-10-CM

## 2020-09-29 DIAGNOSIS — I491 Atrial premature depolarization: Secondary | ICD-10-CM | POA: Diagnosis present

## 2020-09-29 LAB — RESP PANEL BY RT-PCR (FLU A&B, COVID) ARPGX2
Influenza A by PCR: NEGATIVE
Influenza B by PCR: NEGATIVE
SARS Coronavirus 2 by RT PCR: NEGATIVE

## 2020-09-29 LAB — CBC
HCT: 38.1 % — ABNORMAL LOW (ref 39.0–52.0)
Hemoglobin: 13.3 g/dL (ref 13.0–17.0)
MCH: 32.8 pg (ref 26.0–34.0)
MCHC: 34.9 g/dL (ref 30.0–36.0)
MCV: 94.1 fL (ref 80.0–100.0)
Platelets: 169 10*3/uL (ref 150–400)
RBC: 4.05 MIL/uL — ABNORMAL LOW (ref 4.22–5.81)
RDW: 12.2 % (ref 11.5–15.5)
WBC: 7.6 10*3/uL (ref 4.0–10.5)
nRBC: 0 % (ref 0.0–0.2)

## 2020-09-29 LAB — BASIC METABOLIC PANEL
Anion gap: 12 (ref 5–15)
BUN: 20 mg/dL (ref 8–23)
CO2: 24 mmol/L (ref 22–32)
Calcium: 9.6 mg/dL (ref 8.9–10.3)
Chloride: 97 mmol/L — ABNORMAL LOW (ref 98–111)
Creatinine, Ser: 1.14 mg/dL (ref 0.61–1.24)
GFR, Estimated: >60 mL/min (ref >60)
Glucose, Bld: 250 mg/dL — ABNORMAL HIGH (ref 70–99)
Potassium: 3.7 mmol/L (ref 3.5–5.1)
Sodium: 133 mmol/L — ABNORMAL LOW (ref 135–145)

## 2020-09-29 LAB — GLUCOSE, CAPILLARY
Glucose-Capillary: 112 mg/dL — ABNORMAL HIGH (ref 70–99)
Glucose-Capillary: 118 mg/dL — ABNORMAL HIGH (ref 70–99)

## 2020-09-29 LAB — TROPONIN I (HIGH SENSITIVITY)
Troponin I (High Sensitivity): 6 ng/L (ref <18)
Troponin I (High Sensitivity): 7 ng/L (ref <18)

## 2020-09-29 LAB — PROTIME-INR
INR: 1.2 (ref 0.8–1.2)
Prothrombin Time: 15.5 seconds — ABNORMAL HIGH (ref 11.4–15.2)

## 2020-09-29 LAB — APTT: aPTT: 30 seconds (ref 24–36)

## 2020-09-29 MED ORDER — ATORVASTATIN CALCIUM 80 MG PO TABS
80.0000 mg | ORAL_TABLET | Freq: Every day | ORAL | Status: DC
  Administered 2020-09-29: 80 mg via ORAL
  Filled 2020-09-29: qty 1

## 2020-09-29 MED ORDER — ASPIRIN 81 MG PO CHEW: 324.0000 mg | CHEWABLE_TABLET | ORAL | Status: AC

## 2020-09-29 MED ORDER — SODIUM CHLORIDE 0.9% FLUSH
3.0000 mL | Freq: Two times a day (BID) | INTRAVENOUS | Status: DC
  Administered 2020-09-30: 3 mL via INTRAVENOUS

## 2020-09-29 MED ORDER — INFLUENZA VAC SPLIT QUAD 0.5 ML IM SUSY
0.5000 mL | PREFILLED_SYRINGE | INTRAMUSCULAR | Status: DC
  Filled 2020-09-29: qty 0.5

## 2020-09-29 MED ORDER — ASPIRIN EC 81 MG PO TBEC
81.0000 mg | DELAYED_RELEASE_TABLET | Freq: Every day | ORAL | Status: DC
  Administered 2020-09-30 – 2020-10-01 (×2): 81 mg via ORAL
  Filled 2020-09-29 (×2): qty 1

## 2020-09-29 MED ORDER — GABAPENTIN 300 MG PO CAPS: 900.0000 mg | ORAL_CAPSULE | ORAL | Status: DC

## 2020-09-29 MED ORDER — PANTOPRAZOLE SODIUM 40 MG PO TBEC
40.0000 mg | DELAYED_RELEASE_TABLET | Freq: Every day | ORAL | Status: DC
  Filled 2020-09-29: qty 1

## 2020-09-29 MED ORDER — ISOSORBIDE MONONITRATE ER 30 MG PO TB24
120.0000 mg | ORAL_TABLET | Freq: Every day | ORAL | Status: DC
  Filled 2020-09-29: qty 4

## 2020-09-29 MED ORDER — NITROGLYCERIN 0.4 MG SL SUBL
0.4000 mg | SUBLINGUAL_TABLET | SUBLINGUAL | Status: DC | PRN
  Administered 2020-09-29: 0.4 mg via SUBLINGUAL
  Filled 2020-09-29: qty 1

## 2020-09-29 MED ORDER — HEPARIN (PORCINE) 25000 UT/250ML-% IV SOLN
850.0000 [IU]/h | INTRAVENOUS | Status: DC
  Administered 2020-09-29: 900 [IU]/h via INTRAVENOUS
  Filled 2020-09-29: qty 250

## 2020-09-29 MED ORDER — ACETAMINOPHEN 325 MG PO TABS: 650.0000 mg | ORAL_TABLET | ORAL | Status: DC | PRN

## 2020-09-29 MED ORDER — INSULIN ASPART 100 UNIT/ML IJ SOLN
0.0000 [IU] | Freq: Three times a day (TID) | INTRAMUSCULAR | Status: DC
  Administered 2020-09-30 – 2020-10-01 (×2): 2 [IU] via SUBCUTANEOUS

## 2020-09-29 MED ORDER — PRASUGREL HCL 10 MG PO TABS
10.0000 mg | ORAL_TABLET | Freq: Every day | ORAL | Status: DC
  Administered 2020-09-30 – 2020-10-01 (×2): 10 mg via ORAL
  Filled 2020-09-29 (×3): qty 1

## 2020-09-29 MED ORDER — SODIUM CHLORIDE 0.9% FLUSH: 3.0000 mL | INTRAVENOUS | Status: DC | PRN

## 2020-09-29 MED ORDER — METOPROLOL SUCCINATE ER 25 MG PO TB24
200.0000 mg | ORAL_TABLET | Freq: Every day | ORAL | Status: DC
  Filled 2020-09-29: qty 8

## 2020-09-29 MED ORDER — ISOSORBIDE MONONITRATE ER 60 MG PO TB24
120.0000 mg | ORAL_TABLET | Freq: Every day | ORAL | Status: DC
  Filled 2020-09-29 (×2): qty 2

## 2020-09-29 MED ORDER — SODIUM CHLORIDE 0.9 % WEIGHT BASED INFUSION
3.0000 mL/kg/h | INTRAVENOUS | Status: DC
  Administered 2020-09-30: 3 mL/kg/h via INTRAVENOUS

## 2020-09-29 MED ORDER — SPIRONOLACTONE 25 MG PO TABS
25.0000 mg | ORAL_TABLET | Freq: Every day | ORAL | Status: DC
  Filled 2020-09-29 (×3): qty 1

## 2020-09-29 MED ORDER — TIZANIDINE HCL 4 MG PO TABS
8.0000 mg | ORAL_TABLET | Freq: Three times a day (TID) | ORAL | Status: DC
  Administered 2020-09-29 – 2020-09-30 (×2): 8 mg via ORAL
  Filled 2020-09-29 (×2): qty 2

## 2020-09-29 MED ORDER — METOPROLOL SUCCINATE ER 100 MG PO TB24
200.0000 mg | ORAL_TABLET | Freq: Every day | ORAL | Status: DC
  Filled 2020-09-29 (×2): qty 2

## 2020-09-29 MED ORDER — PANTOPRAZOLE SODIUM 40 MG PO TBEC
40.0000 mg | DELAYED_RELEASE_TABLET | Freq: Every day | ORAL | Status: DC
  Administered 2020-09-30 – 2020-10-01 (×2): 40 mg via ORAL
  Filled 2020-09-29 (×2): qty 1

## 2020-09-29 MED ORDER — ONDANSETRON HCL 4 MG/2ML IJ SOLN
4.0000 mg | Freq: Four times a day (QID) | INTRAMUSCULAR | Status: DC | PRN
  Administered 2020-09-30 – 2020-10-01 (×2): 4 mg via INTRAVENOUS
  Filled 2020-09-29 (×3): qty 2

## 2020-09-29 MED ORDER — SODIUM CHLORIDE 0.9 % IV SOLN: 250.0000 mL | INTRAVENOUS | Status: DC | PRN

## 2020-09-29 MED ORDER — MORPHINE SULFATE (PF) 2 MG/ML IV SOLN
2.0000 mg | Freq: Once | INTRAVENOUS | Status: AC
Start: 2020-09-29 — End: 2020-09-29
  Administered 2020-09-29: 2 mg via INTRAVENOUS
  Filled 2020-09-29: qty 1

## 2020-09-29 MED ORDER — GABAPENTIN 400 MG PO CAPS
1200.0000 mg | ORAL_CAPSULE | Freq: Every day | ORAL | Status: DC
  Administered 2020-09-29: 1200 mg via ORAL
  Filled 2020-09-29: qty 3

## 2020-09-29 MED ORDER — SPIRONOLACTONE 25 MG PO TABS
25.0000 mg | ORAL_TABLET | Freq: Every day | ORAL | Status: DC
  Filled 2020-09-29: qty 1

## 2020-09-29 MED ORDER — GABAPENTIN 400 MG PO CAPS: 1200.0000 mg | ORAL_CAPSULE | Freq: Every day | ORAL | Status: DC

## 2020-09-29 MED ORDER — TIZANIDINE HCL 4 MG PO TABS
4.0000 mg | ORAL_TABLET | Freq: Three times a day (TID) | ORAL | Status: DC
  Filled 2020-09-29: qty 1

## 2020-09-29 MED ORDER — SODIUM CHLORIDE 0.9 % WEIGHT BASED INFUSION
1.0000 mL/kg/h | INTRAVENOUS | Status: DC
  Administered 2020-09-30 (×2): 250 mL via INTRAVENOUS

## 2020-09-29 MED ORDER — AMLODIPINE BESYLATE 5 MG PO TABS
10.0000 mg | ORAL_TABLET | Freq: Every day | ORAL | Status: DC
  Filled 2020-09-29: qty 2

## 2020-09-29 MED ORDER — ASPIRIN 300 MG RE SUPP: 300.0000 mg | RECTAL | Status: AC

## 2020-09-29 MED ORDER — AMLODIPINE BESYLATE 10 MG PO TABS
10.0000 mg | ORAL_TABLET | Freq: Every day | ORAL | Status: DC
  Filled 2020-09-29 (×2): qty 1

## 2020-09-29 NOTE — ED Provider Notes (Signed)
[place] EMERGENCY DEPARTMENT Provider Note   CSN: [id number] Arrival date & time: 09/29/20  1342     History Chief Complaint  Patient presents with   Chest Pain    Terry Nelson is a 65 y.o. male with PMHx HTN, HLD, T2DM, CHF, Atrial Fibrillation, CAD who presents for evaluation of chest pain.  Of note, patient underwent LHC on 8/30, which demonstrated recurrent diffuse in-stent restenosis of his "full metal jacket" LAD.   Today, he presents for evaluation of chest pain and shortness of breath.  He states that he has been having recurrent episodes of exertional chest pain and shortness of breath since his left heart cath.  He states these episodes happen numerous times throughout the day, both with exertion and at rest.  He describes the chest pain as a pressure, radiating to his left arm and back.  He reports associated nausea and diaphoresis.  He has been taking nitro 10-12 times a day with transient improvement.  After discussion with his cardiologist, he was advised to present to our emergency department for further evaluation.   HPI: A 65 year old patient with a history of treated diabetes, hypertension and hypercholesterolemia presents for evaluation of chest pain. Initial onset of pain was less than one hour ago. The patient's chest pain is not worse with exertion. The patient's chest pain is middle- or left-sided, is not well-localized, is not described as heaviness/pressure/tightness, is not sharp and does not radiate to the arms/jaw/neck. The patient does not complain of nausea and denies diaphoresis. The patient has no history of stroke, has no history of peripheral artery disease, has not smoked in the past 90 days, has no relevant family history of coronary artery disease (first degree relative at less than age 85) and does not have an elevated BMI (>=30).   Past Medical History:  Diagnosis Date   Anxiety state    Arteriosclerosis of coronary artery     Body mass index (bmi) 26.0-26.9, adult    Chest pain    CHF (congestive heart failure) (HCC)    Chronic renal impairment    DDD (degenerative disc disease), lumbar    DM (diabetes mellitus) ([place])    Gastritis    Headache    Hemorrhage    Hyperlipidemia    Hypertension    Kidney stones    Lumbago    Neck pain    Paroxysmal A-fib (HCC)    Pernicious anemia    Unstable angina [facility])     Patient Active Problem List   Diagnosis Date Noted   Unstable angina ([place]) 09/29/2020   PAC (premature atrial contraction) 08/11/2020   PAF (paroxysmal atrial fibrillation) ([place]) 08/11/2020   Hypertension 04/28/2019   Coronary artery disease involving native coronary artery of native heart with unstable angina pectoris ([place]) 04/27/2019   CAD (coronary artery disease) of artery bypass graft 04/09/2019   Angina pectoris ([place]) 04/09/2019   Hyperlipidemia LDL goal <70 04/09/2019    Past Surgical History:  Procedure Laterality Date   ABLATION  2017   CARDIAC CATHETERIZATION     CORONARY ARTERY BYPASS GRAFT     CORONARY BALLOON ANGIOPLASTY N/A 04/27/2019   Procedure: CORONARY BALLOON ANGIOPLASTY;  Surgeon: Nelva Bush, MD;  Location: [place] CV LAB;  Service: Cardiovascular;  Laterality: N/A;   CORONARY BALLOON ANGIOPLASTY N/A 12/16/2019   Procedure: CORONARY BALLOON ANGIOPLASTY;  Surgeon: Belva Crome, MD;  Location: [place] CV LAB;  Service: Cardiovascular;  Laterality: N/A;   CORONARY BALLOON  ANGIOPLASTY N/A 09/06/2020   Procedure: CORONARY BALLOON ANGIOPLASTY;  Surgeon: Belva Crome, MD;  Location: [place] CV LAB;  Service: Cardiovascular;  Laterality: N/A;   INTRAVASCULAR ULTRASOUND/IVUS N/A 04/27/2019   Procedure: Intravascular Ultrasound/IVUS;  Surgeon: Nelva Bush, MD;  Location: [place] CV LAB;  Service: Cardiovascular;  Laterality: N/A;   INTRAVASCULAR ULTRASOUND/IVUS N/A 12/16/2019   Procedure: Intravascular Ultrasound/IVUS;  Surgeon: Belva Crome, MD;   Location: [place] CV LAB;  Service: Cardiovascular;  Laterality: N/A;   LEFT HEART CATH AND CORS/GRAFTS ANGIOGRAPHY N/A 04/09/2019   Procedure: LEFT HEART CATH AND CORS/GRAFTS ANGIOGRAPHY;  Surgeon: Belva Crome, MD;  Location: [place] CV LAB;  Service: Cardiovascular;  Laterality: N/A;   LEFT HEART CATH AND CORS/GRAFTS ANGIOGRAPHY N/A 12/16/2019   Procedure: LEFT HEART CATH AND CORS/GRAFTS ANGIOGRAPHY;  Surgeon: Belva Crome, MD;  Location: [place] CV LAB;  Service: Cardiovascular;  Laterality: N/A;   LEFT HEART CATH AND CORS/GRAFTS ANGIOGRAPHY N/A 09/06/2020   Procedure: LEFT HEART CATH AND CORS/GRAFTS ANGIOGRAPHY;  Surgeon: Belva Crome, MD;  Location: [place] CV LAB;  Service: Cardiovascular;  Laterality: N/A;       Family History  Problem Relation Age of Onset   High blood pressure Mother    Stroke Mother    Diabetes Father    Heart disease Other     Social History   Tobacco Use   Smoking status: Never   Smokeless tobacco: Former    Types: Chew   Tobacco comments:    per pt chewed tobacco in high school   Vaping Use   Vaping Use: Never used  Substance Use Topics   Alcohol use: Never   Drug use: Never    Home Medications Prior to Admission medications   Medication Sig Start Date End Date Taking? Authorizing Provider  amLODipine (NORVASC) 10 MG tablet Take 10 mg by mouth daily. 08/31/20   [provider]  apixaban (ELIQUIS) 5 MG TABS tablet Take 1 tablet (5 mg total) by mouth 2 (two) times daily. 07/08/20   Belva Crome, MD  atorvastatin (LIPITOR) 80 MG tablet Take 80 mg by mouth at bedtime.    [provider]  Blood Glucose Monitoring Suppl (CONTOUR NEXT MONITOR) w/Device KIT daily. 11/02/19   [provider]  furosemide (LASIX) 40 MG tablet Take 1 tablet (40 mg total) by mouth 2 (two) times daily. 09/13/20   Sueanne Margarita, MD  gabapentin (NEURONTIN) 300 MG capsule Take 900-1,200 mg by mouth See admin instructions. Take 900 mg  by mouth in the morning and 1200 mg at bedtime 07/19/20   [provider]  glipiZIDE (GLUCOTROL XL) 10 MG 24 hr tablet Take 10 mg by mouth at bedtime.    [provider]  glucose blood (CONTOUR NEXT TEST) test strip by Does not apply route. 11/02/19   [provider]  Insulin Pen Needle (BD PEN NEEDLE NANO U/F) 32G X 4 MM MISC USE ONCE AS DIRECTED 08/09/19   [provider]  isosorbide mononitrate (IMDUR) 60 MG 24 hr tablet Take 120 mg by mouth daily.    [provider]  metoprolol (TOPROL XL) 200 MG 24 hr tablet Take 1 tablet (200 mg total) by mouth daily. 08/11/20   Chandrasekhar, Terisa Starr, MD  nitroGLYCERIN (NITROSTAT) 0.4 MG SL tablet Place 1 tablet (0.4 mg total) under the tongue every 5 (five) minutes as needed for chest pain. 07/08/20   Belva Crome, MD  oxyCODONE-acetaminophen [facility])  10-325 MG tablet Take 1 tablet by mouth every 4 (four) hours as needed for pain.    [provider]  pantoprazole (PROTONIX) 40 MG tablet Take 40 mg by mouth daily.    [provider]  Polyethylene Glycol 400 (BLINK TEARS OP) Place 1 drop into both eyes 2 (two) times daily as needed (dry eyes).    [provider]  prasugrel (EFFIENT) 10 MG TABS tablet Take 10 mg by mouth daily.    [provider]  promethazine (PHENERGAN) 25 MG tablet Take 25 mg by mouth 2 (two) times daily as needed for nausea or vomiting.    [provider]  spironolactone (ALDACTONE) 25 MG tablet Take 25 mg by mouth daily. 08/26/18   [provider]  tiZANidine (ZANAFLEX) 4 MG tablet Take 4-8 mg by mouth See admin instructions. Take 4 mg by mouth in the morning, afternoon and dinner and 8 mg bedtime    [provider]  TOUJEO SOLOSTAR 300 UNIT/ML Solostar Pen Inject 30 Units into the skin daily. 11/07/18   [provider]  traZODone (DESYREL) 50 MG tablet Take 50 mg by mouth at bedtime as needed for sleep. 08/26/20   [provider]    Allergies    Metoclopramide and Penicillins  Review of Systems   Review of Systems  Constitutional:  Positive for diaphoresis and fatigue. Negative for chills and fever.  HENT:  Negative for ear pain and sore throat.   Eyes:  Negative for pain and visual disturbance.  Respiratory:  Positive for shortness of breath. Negative for cough.   Cardiovascular:  Positive for chest pain. Negative for palpitations.  Gastrointestinal:  Positive for nausea. Negative for abdominal pain and vomiting.  Genitourinary:  Negative for dysuria and hematuria.  Musculoskeletal:  Negative for arthralgias and back pain.  Skin:  Negative for color change and rash.  Neurological:  Negative for seizures and syncope.  All other systems reviewed and are negative.  Physical Exam Updated Vital Signs BP (!) 166/74 (BP Location: Right Arm)   Pulse 63   Temp 98.7 F (37.1 C)   Resp 18   Ht 5' 7" (1.702 m)   Wt 73 kg   SpO2 100%   BMI 25.22 kg/m   Physical Exam Vitals and nursing note reviewed.  Constitutional:      Appearance: He is well-developed.  HENT:     Head: Normocephalic and atraumatic.  Eyes:     Conjunctiva/sclera: Conjunctivae normal.  Cardiovascular:     Rate and Rhythm: Normal rate and regular rhythm.     Heart sounds: No murmur heard. Pulmonary:     Effort: Pulmonary effort is normal. No respiratory distress.     Breath sounds: Normal breath sounds.  Abdominal:     Palpations: Abdomen is soft.     Tenderness: There is no abdominal tenderness.  Musculoskeletal:     Cervical back: Neck supple.  Skin:    General: Skin is warm and dry.     Capillary Refill: Capillary refill takes less than 2 seconds.  Neurological:     General: No focal deficit present.     Mental Status: He is alert and oriented to person, place, and time.    ED Results / Procedures / Treatments   Labs (all labs ordered are listed, but only abnormal results are displayed) Labs Reviewed  BASIC  METABOLIC PANEL - Abnormal; Notable for the following components:      Result Value   Sodium 133 (*)  Chloride 97 (*)    Glucose, Bld 250 (*)    All other components within normal limits  CBC - Abnormal; Notable for the following components:   RBC 4.05 (*)    HCT 38.1 (*)    All other components within normal limits  APTT  PROTIME-INR  APTT  BASIC METABOLIC PANEL  CBC  APTT  TROPONIN I (HIGH SENSITIVITY)  TROPONIN I (HIGH SENSITIVITY)   EKG EKG Interpretation  Date/Time:  Thursday September 29 2020 13:47:48 EDT Ventricular Rate:  69 PR Interval:  124 QRS Duration: 84 QT Interval:  408 QTC Calculation: 437 R Axis:   46 Text Interpretation: Normal sinus rhythm Confirmed by Lennice Sites ([id number]) on 09/29/2020 3:51:50 PM  Radiology DG Chest 2 View  Result Date: 09/29/2020 CLINICAL DATA:  chest pain EXAM: CHEST - 2 VIEW COMPARISON:  None. FINDINGS: Right-sided transvenous catheter with tip overlying the right atrium. Coronary artery stents/graft. Implantable loop recorder. Cardiomediastinal silhouette is within normal limits. No pleural effusion no pneumothorax. No focal consolidative process. Median sternotomy wires without acute osseous abnormality. IMPRESSION: No acute radiographic abnormality in the chest. Other stable findings as described. Electronically Signed   By: Albin Felling M.D.   On: 09/29/2020 15:14    Procedures Procedures   Medications Ordered in ED Medications  atorvastatin (LIPITOR) tablet 80 mg (has no administration in time range)  prasugrel (EFFIENT) tablet 10 mg (10 mg Oral Not Given 09/29/20 1722)  aspirin chewable tablet 324 mg (324 mg Oral Not Given 09/29/20 1722)    Or  aspirin suppository 300 mg ( Rectal See Alternative 09/29/20 1722)  aspirin EC tablet 81 mg (has no administration in time range)  nitroGLYCERIN (NITROSTAT) SL tablet 0.4 mg (has no administration in time range)  acetaminophen (TYLENOL) tablet 650 mg (has no administration in time  range)  ondansetron (ZOFRAN) injection 4 mg (has no administration in time range)  sodium chloride flush (NS) 0.9 % injection 3 mL (has no administration in time range)  sodium chloride flush (NS) 0.9 % injection 3 mL (has no administration in time range)  0.9 %  sodium chloride infusion (has no administration in time range)  0.9% sodium chloride infusion (has no administration in time range)    Followed by  0.9% sodium chloride infusion (has no administration in time range)  heparin ADULT infusion 100 units/mL (25000 units/268m) (has no administration in time range)  amLODipine (NORVASC) tablet 10 mg (10 mg Oral Not Given 09/29/20 1722)  isosorbide mononitrate (IMDUR) 24 hr tablet 120 mg (has no administration in time range)  metoprolol succinate (TOPROL-XL) 24 hr tablet 200 mg (200 mg Oral Not Given 09/29/20 1721)  pantoprazole (PROTONIX) EC tablet 40 mg (has no administration in time range)  spironolactone (ALDACTONE) tablet 25 mg (has no administration in time range)    ED Course  I have reviewed the triage vital signs and the nursing notes.  Pertinent labs & imaging results that were available during my care of the patient were reviewed by me and considered in my medical decision making (see chart for details).    MDM Rules/Calculators/A&P HEAR Score: 344                        65y.o. male with past medical history as above who presents for evaluation of exertional chest pain and shortness of breath, in the context of known CAD. Afebrile and hemodynamically stable.  Exam as detailed above.  Initial differential includes (  but is not limited to): ACS, PNA, PE, Aortic Dissection, Pancreatitis, Arrhythmia, Pneumothorax, Endo/Myo/Pericarditis, Shingles, Emergent complications of an Ulcer, Esophageal pathology.  The CXR assists in ruling out pneumonia, Pneumothorax, and Esophageal Tears.  No focal lung findings suggestive of pneumonia or pneumothorax. The patient does not appear to have a  Pulmonary Embolism based no apparent asymmetric upper extremity or lower extremity edema/swelling suggestive of DVT, absence of tachycardia and hypoxia, absence of pleuritic chest pain, and low overall clinical suspicion.  Patient denies any fevers, positional change in chest pain with leaning forward or lying down and has no suggestive EKG changes, making both pericarditis and myocarditis less likely. There does not appear to be an Aortic Dissection either, based on physical exam, historically pain not abrupt in onset, tearing or ripping, pulses symmetric, and absence of acute focal neurologic deficit. The patient's risk factors for ACS were reviewed as well as the EKG. As reviewed above, EKG without evidence of ischemia or arrhythmia.  HEAR score ineligible due to history of CAD.  CBC unremarkable.  BMP with mild hyponatremia with sodium 133.  Initial troponin was normal.  Presentation is most consistent with unstable angina.  Patient was discussed with cardiology, who agreed to admit the patient for further evaluation and management.  They will plan for LHC in the morning.  Patient was placed on heparin drip.  Final Clinical Impression(s) / ED Diagnoses Final diagnoses:  Unstable angina [facility])    Rx / DC Orders ED Discharge Orders     None        Violet Baldy, MD 09/29/20 [place], [place], DO 09/29/20 1757

## 2020-09-29 NOTE — Progress Notes (Signed)
2345-Pt called nurse to room stated chest pain increasing 5/10 pain sharp. Pt given one NTG tab 0.4 mg sublingual as ordered. Pt BP and HR stable see chart for details.   2350- Pt stated pain easing off somewhat. Pain 5/10 sharp unchanged and not increasing at this time. Pt is refusing to take any additional medications at this time. Will closely monitor pt for further complications.

## 2020-09-29 NOTE — Telephone Encounter (Signed)
Error

## 2020-09-29 NOTE — ED Provider Notes (Signed)
I have personally seen and examined the patient. I have reviewed the documentation on PMH/FH/Soc Hx. I have discussed the plan of care with the resident and patient.  I have reviewed and agree with the resident's documentation. Please see associated encounter note.  Briefly, the patient is a 65 y.o. male here with chest pain.  History of CAD with CABG.  Had heart cath several weeks ago and still having chest pain.  Had already reached out to cardiology today who wanted him to come back to the hospital for admission and likely repeat heart catheterization.  He has been using nitroglycerin very frequently with minimal relief.  Lab work is overall unremarkable.  Troponin within normal limits.  EKG shows sinus rhythm.  Overall concern for ongoing unstable angina and cardiology to admit.  Overall complicated heart history.  He had two-vessel CABG back in 2002.  He has had multiple PCI's with consist of essentially stent down the entire LAD complicated by in-stent restenosis about a month ago with balloon angioplasty per cardiology note.  Overall suspect may be a restenosis again and will cath tomorrow.  Patient to have IV heparin continuously and will have morphine and nitro to help with ongoing pain.  This chart was dictated using voice recognition software.  Despite best efforts to proofread,  errors can occur which can change the documentation meaning.    EKG Interpretation  Date/Time:  Thursday September 29 2020 13:47:48 EDT Ventricular Rate:  69 PR Interval:  124 QRS Duration: 84 QT Interval:  408 QTC Calculation: 437 R Axis:   46 Text Interpretation: Normal sinus rhythm Confirmed by Virgina Norfolk ([id number]) on 09/29/2020 3:51:50 PM        .Critical Care Performed by: Virgina Norfolk, DO Authorized by: Virgina Norfolk, DO   Critical care provider statement:    Critical care time (minutes):  35   Critical care was time spent personally by me on the following activities:  Blood draw for specimens,  development of treatment plan with patient or surrogate, discussions with primary provider, evaluation of patient's response to treatment, examination of patient, obtaining history from patient or surrogate, ordering and performing treatments and interventions, ordering and review of laboratory studies, ordering and review of radiographic studies, pulse oximetry, re-evaluation of patient's condition and review of old charts   I assumed direction of critical care for this patient from another provider in my specialty: no     Care discussed with: admitting provider       Virgina Norfolk, DO 09/29/20 1714

## 2020-09-29 NOTE — Telephone Encounter (Signed)
Patient states he is on the way to the hospital for chest pain and has taken nitroglycerin He says he will be there in 15-20 minutes and does not need a call back.

## 2020-09-29 NOTE — Progress Notes (Signed)
ANTICOAGULATION CONSULT NOTE - Initial Consult  Pharmacy Consult for heparin Indication: chest pain/ACS  Allergies  Allergen Reactions   Metoclopramide Other (See Comments)    DYSKINESIAS   Penicillins Swelling and Rash    Did it involve swelling of the face/tongue/throat, SOB, or low BP? Yes Did it involve sudden or severe rash/hives, skin peeling, or any reaction on the inside of your mouth or nose? Unknown Did you need to seek medical attention at a hospital or doctor's office? Yes When did it last happen?More than 40 years ago    If all above answers are "NO", may proceed with cephalosporin use.     Patient Measurements:   Heparin Dosing Weight: 73 kg  Vital Signs: Temp: 98.7 F (37.1 C) (09/22 1350) BP: 166/74 (09/22 1548) Pulse Rate: 63 (09/22 1548)  Labs: Recent Labs    09/29/20 1421  HGB 13.3  HCT 38.1*  PLT 169  CREATININE 1.14  TROPONINIHS 6    CrCl cannot be calculated (Unknown ideal weight.).  Medical History: Past Medical History:  Diagnosis Date   Anxiety state    Arteriosclerosis of coronary artery    Body mass index (bmi) 26.0-26.9, adult    Chest pain    CHF (congestive heart failure) (HCC)    Chronic renal impairment    DDD (degenerative disc disease), lumbar    DM (diabetes mellitus) (HCC)    Gastritis    Headache    Hemorrhage    Hyperlipidemia    Hypertension    Kidney stones    Lumbago    Neck pain    Paroxysmal A-fib (HCC)    Pernicious anemia    Unstable angina (HCC)     Medications:  Infusions:   sodium chloride     [START ON 09/30/2020] sodium chloride     Followed by   Melene Muller ON 09/30/2020] sodium chloride      Assessment: 65 yo male presents with chest pain since his last [facility] 09/06/20. Patient takes Eliquis for paroxysmal atrial fibrillation, last dose 9/22 AM. Pharmacy consulted for heparin dosing.  CBC stable - Hgb 123.3, Plt 169. LHC scheduled for tomorrow, 9/23.  Goal of Therapy:  aPTT 66-102 seconds Monitor  platelets by anticoagulation protocol: Yes   Plan:  No bolus Start heparin infusion 900 units/hr 6 hour aPTT Daily CBC, heparin level, aPTT until correlating, then heparin level Monitor for s/sx of bleeding F/u plans post cath  Filbert Schilder, PharmD PGY1 Pharmacy Resident 09/29/2020  4:36 PM  Please check [email or web address] for unit-specific pharmacy phone numbers.

## 2020-09-29 NOTE — ED Provider Notes (Signed)
Emergency Medicine Provider Triage Evaluation Note  Terry Nelson , a 65 y.o. male  was evaluated in triage.  Pt complains of chest pain.  She states that this has been going on intermittently since he received his last heart cath.  Has been taking approximately 10-12 nitroglycerin a day which relieves pain.  Patient has extensive cardiac history with many stent placement.  Endorses pain central to the chest that radiates to the left arm and back.  Endorses associated diaphoresis.  Review of Systems  Positive: Chest pain Negative: Nausea, vomiting, diarrhea, fevers, chills  Physical Exam  BP (!) 148/79 (BP Location: Right Arm)   Pulse 67   Temp 98.7 F (37.1 C)   Resp 16   SpO2 100%  Gen:   Awake, no distress  Resp:  Normal effort  MSK:   Moves extremities without difficulty    Medical Decision Making  Medically screening exam initiated at 2:25 PM.  Appropriate orders placed.  Terry Nelson was informed that the remainder of the evaluation will be completed by another provider, this initial triage assessment does not replace that evaluation, and the importance of remaining in the ED until their evaluation is complete.     Terry Nelson 09/29/20 1426    Terry Dibbles, MD 10/02/20 [phone]

## 2020-09-29 NOTE — Telephone Encounter (Signed)
Spoke with pt and made him aware of Dr. Michaelle Copas recommendations.  Pt states he will go but he has to find someone that can come in and take care of his wife before he leaves, so it will likely be after lunch.  Before I could document, pt called back and said he didn't think he could go today because his nephew's funeral is in the morning and he needs to be at the house to help get his wife ready.  Spoke with pt about the urgency of what is going on and the need to get to the hospital.  Pt states he will try to go today but doesn't know for sure if he will make it.  Advised pt again of the importance that he get to the hospital ASAP.  Pt verbalized understanding.

## 2020-09-29 NOTE — ED Triage Notes (Signed)
Pt coming from home complaint of cp getting progressively worse for 3-4 weeks. Endorses taking 10-12 nitro per day.

## 2020-09-29 NOTE — H&P (Signed)
Cardiology Admission History and Physical:   Patient ID: Terry Nelson MRN: [id number]; DOB: 1955-08-27   Admission date: 09/29/2020  PCP:  Mila Palmer, [place] [facility] Cardiologist:  Sinclair Grooms, MD     Chief Complaint:  Chest pain  Patient Profile:   Terry Nelson is a 65 y.o. male with CAD status post CABG ' 2, multiple PCI's, hypertension, hyperlipidemia, diabetes, CKD, PAF status post prior ablation on Eliquis who is being seen 09/29/2020 for the evaluation of chest pain.  History of Present Illness:   Terry Nelson is a 65 year old male with past medical history noted above.  He has been followed by Dr. Tamala Julian as an outpatient.  He underwent CABG in 2002 with Dr. Roxan Hockey.  Since that time has had multiple overlapping stents from the proximal to distal LAD with recurrent in-stent restenosis requiring subsequent balloon angioplasty procedures.  Most recently he was admitted to the hospital for a 10-day history of chest pain.  Reported he was taking multiple nitroglycerin tablets a day.  Underwent cardiac catheterization with Dr. Tamala Julian with diffuse in-stent restenosis in the proximal to distal overlapping full metal jacket LAD.  Had successful scoring balloon angioplasty from the apical to proximal segment in overlapping fashion which reduced the in-stent restenosis to less than 50%.  Noted to have patent SVG to OM, with atretic LIMA to LAD which was not engaged.  Normal LV function and LVEDP on cath.  He did receive Angiomax 2 hours post cath.  He was placed on triple therapy with aspirin/Effient/Eliquis for a total of 1 week with plans to stop aspirin and continue his Eliquis and Effient.  Suggested adding SGLT2 during that admission but reported he had had intolerances with elevated blood sugars.  Management was deferred to his PCP.  Seen in the office on 09/13/2020 as an add-on visit with Dr. Radford Pax.  Stated had increased lower extremity edema.  He had increased his  Lasix from 20 mg daily to 40 mg daily for 3 days without improvement.  Did note improvement in his chronic angina since his PCI but had chest pain the day he was seen in the office and took 4-5 sublingual nitroglycerin before it stopped.  Actions were to admit the patient to a telemetry bed for serial EKG and troponins along with IV diuresis.  The patient refused admission stating he was concerned for his wife at home who has MS and he is her primary caregiver.  He would need to make arrangements for someone to stay with her.  Requested to increase his Lasix to 40 mg twice daily and call back in the morning.  Called back the following day stating he felt much better, and shortness of breath had improved.  He did not wish to be admitted.  Noted patient call on 9/14 stating he had been taking nitroglycerin on a regular basis again.  He was instructed to continue on his Imdur 120 mg daily and keep his follow-up in the office.  Advised to present to the ED if symptoms worsened or changed.  He called in to the office 9/20 stating he had taken multiple sublingual nitro but continued to have chest pain.  Advised to call EMS and present to the ED.  He again stated he could not go as he is his wife's caregiver.  Strongly advised that he be evaluated in the ED but patient declined.  Phone note from today indicating that patient is advised to present to the  ED with symptoms.  He had a funeral for his nephew this morning and had to assist his wife with things but he would present to the ED later today.  In the ED his labs showed Na+ 133, K+ 3.7, Cr 1.14, WBC 7.6, hemoglobin 13.3.  EKG showed this rhythm, 69 bpm, no acute ST/T wave abnormalities.  Chest x-ray negative.  Reports his last dose of Eliquis was this morning.  Past Medical History:  Diagnosis Date   Anxiety state    Arteriosclerosis of coronary artery    Body mass index (bmi) 26.0-26.9, adult    Chest pain    CHF (congestive heart failure) (HCC)     Chronic renal impairment    DDD (degenerative disc disease), lumbar    DM (diabetes mellitus) ([place])    Gastritis    Headache    Hemorrhage    Hyperlipidemia    Hypertension    Kidney stones    Lumbago    Neck pain    Paroxysmal A-fib (HCC)    Pernicious anemia    Unstable angina ([place])     Past Surgical History:  Procedure Laterality Date   ABLATION  2017   CARDIAC CATHETERIZATION     CORONARY ARTERY BYPASS GRAFT     CORONARY BALLOON ANGIOPLASTY N/A 04/27/2019   Procedure: CORONARY BALLOON ANGIOPLASTY;  Surgeon: Nelva Bush, MD;  Location: [place] [facility];  Service: Cardiovascular;  Laterality: N/A;   CORONARY BALLOON ANGIOPLASTY N/A 12/16/2019   Procedure: CORONARY BALLOON ANGIOPLASTY;  Surgeon: Belva Crome, MD;  Location: [place] [facility];  Service: Cardiovascular;  Laterality: N/A;   CORONARY BALLOON ANGIOPLASTY N/A 09/06/2020   Procedure: CORONARY BALLOON ANGIOPLASTY;  Surgeon: Belva Crome, MD;  Location: [place] [facility];  Service: Cardiovascular;  Laterality: N/A;   INTRAVASCULAR ULTRASOUND/IVUS N/A 04/27/2019   Procedure: Intravascular Ultrasound/IVUS;  Surgeon: Nelva Bush, MD;  Location: [place] [facility];  Service: Cardiovascular;  Laterality: N/A;   INTRAVASCULAR ULTRASOUND/IVUS N/A 12/16/2019   Procedure: Intravascular Ultrasound/IVUS;  Surgeon: Belva Crome, MD;  Location: [place] [facility];  Service: Cardiovascular;  Laterality: N/A;   LEFT HEART CATH AND CORS/GRAFTS ANGIOGRAPHY N/A 04/09/2019   Procedure: LEFT HEART CATH AND CORS/GRAFTS ANGIOGRAPHY;  Surgeon: Belva Crome, MD;  Location: [place] [facility];  Service: Cardiovascular;  Laterality: N/A;   LEFT HEART CATH AND CORS/GRAFTS ANGIOGRAPHY N/A 12/16/2019   Procedure: LEFT HEART CATH AND CORS/GRAFTS ANGIOGRAPHY;  Surgeon: Belva Crome, MD;  Location: [place] [facility];  Service: Cardiovascular;  Laterality: N/A;   LEFT HEART CATH AND CORS/GRAFTS ANGIOGRAPHY N/A 09/06/2020   Procedure: LEFT  HEART CATH AND CORS/GRAFTS ANGIOGRAPHY;  Surgeon: Belva Crome, MD;  Location: [place] [facility];  Service: Cardiovascular;  Laterality: N/A;     Medications Prior to Admission: Prior to Admission medications   Medication Sig Start Date End Date Taking? Authorizing Provider  amLODipine (NORVASC) 10 MG tablet Take 10 mg by mouth daily. 08/31/20   [provider]  apixaban (ELIQUIS) 5 MG TABS tablet Take 1 tablet (5 mg total) by mouth 2 (two) times daily. 07/08/20   Belva Crome, MD  atorvastatin (LIPITOR) 80 MG tablet Take 80 mg by mouth at bedtime.    [provider]  Blood Glucose Monitoring Suppl (CONTOUR NEXT MONITOR) w/Device KIT daily. 11/02/19   [provider]  furosemide (LASIX) 40 MG tablet Take 1 tablet (40 mg total) by mouth 2 (two) times daily. 09/13/20  Sueanne Margarita, MD  gabapentin (NEURONTIN) 300 MG capsule Take 900-1,200 mg by mouth See admin instructions. Take 900 mg by mouth in the morning and 1200 mg at bedtime 07/19/20   [provider]  glipiZIDE (GLUCOTROL XL) 10 MG 24 hr tablet Take 10 mg by mouth at bedtime.    [provider]  glucose blood (CONTOUR NEXT TEST) test strip by Does not apply route. 11/02/19   [provider]  Insulin Pen Needle (BD PEN NEEDLE NANO U/F) 32G X 4 MM MISC USE ONCE AS DIRECTED 08/09/19   [provider]  isosorbide mononitrate (IMDUR) 60 MG 24 hr tablet Take 120 mg by mouth daily.    [provider]  metoprolol (TOPROL XL) 200 MG 24 hr tablet Take 1 tablet (200 mg total) by mouth daily. 08/11/20   Chandrasekhar, Terisa Starr, MD  nitroGLYCERIN (NITROSTAT) 0.4 MG SL tablet Place 1 tablet (0.4 mg total) under the tongue every 5 (five) minutes as needed for chest pain. 07/08/20   Belva Crome, MD  oxyCODONE-acetaminophen (PERCOCET) 10-325 MG tablet Take 1 tablet by mouth every 4 (four) hours as needed for pain.    [provider]  pantoprazole (PROTONIX) 40 MG tablet Take 40  mg by mouth daily.    [provider]  Polyethylene Glycol 400 (BLINK TEARS OP) Place 1 drop into both eyes 2 (two) times daily as needed (dry eyes).    [provider]  prasugrel (EFFIENT) 10 MG TABS tablet Take 10 mg by mouth daily.    [provider]  promethazine (PHENERGAN) 25 MG tablet Take 25 mg by mouth 2 (two) times daily as needed for nausea or vomiting.    [provider]  spironolactone (ALDACTONE) 25 MG tablet Take 25 mg by mouth daily. 08/26/18   [provider]  tiZANidine (ZANAFLEX) 4 MG tablet Take 4-8 mg by mouth See admin instructions. Take 4 mg by mouth in the morning, afternoon and dinner and 8 mg bedtime    [provider]  TOUJEO SOLOSTAR 300 UNIT/ML Solostar Pen Inject 30 Units into the skin daily. 11/07/18   [provider]  traZODone (DESYREL) 50 MG tablet Take 50 mg by mouth at bedtime as needed for sleep. 08/26/20   [provider]     Allergies:    Allergies  Allergen Reactions   Metoclopramide Other (See Comments)    DYSKINESIAS   Penicillins Swelling and Rash    Did it involve swelling of the face/tongue/throat, SOB, or low BP? Yes Did it involve sudden or severe rash/hives, skin peeling, or any reaction on the inside of your mouth or nose? Unknown Did you need to seek medical attention at a hospital or doctor's office? Yes When did it last happen?More than 40 years ago    If all above answers are "NO", may proceed with cephalosporin use.     Social History:   Social History   Socioeconomic History   Marital status: Married    Spouse name: Not on file   Number of children: Not on file   Years of education: Not on file   Highest education level: Not on file  Occupational History   Not on file  Tobacco Use   Smoking status: Never   Smokeless tobacco: Former    Types: Chew   Tobacco comments:    per pt chewed tobacco in high school   Vaping Use   Vaping Use: Never used   Substance and Sexual  Activity   Alcohol use: Never   Drug use: Never   Sexual activity: Not on file    Comment: MARRIED  Other Topics Concern   Not on file  Social History Narrative   Not on file   Social Determinants of Health   Financial Resource Strain: Not on file  Food Insecurity: Not on file  Transportation Needs: Not on file  Physical Activity: Not on file  Stress: Not on file  Social Connections: Not on file  Intimate Partner Violence: Not on file    Family History:   The patient's family history includes Diabetes in his father; Heart disease in an other family member; High blood pressure in his mother; Stroke in his mother.    ROS:  Please see the history of present illness.  All other ROS reviewed and negative.     Physical Exam/Data:   Vitals:   09/29/20 1350 09/29/20 1548 09/29/20 1600  BP: (!) 148/79 (!) 166/74   Pulse: 67 63   Resp: 16 18   Temp: 98.7 F (37.1 C)    SpO2: 100% 100%   Weight:   73 kg  Height:   5\' 7"  (1.702 m)   No intake or output data in the 24 hours ending 09/29/20 1642 Last 3 Weights 09/29/2020 09/13/2020 09/06/2020  Weight (lbs) 161 lb 161 lb 155 lb  Weight (kg) 73.029 kg 73.029 kg 70.308 kg     Body mass index is 25.22 kg/m.   Physical exam per MD.   EKG:  The ECG that was done 9/22 was personally reviewed and demonstrates sinus rhythm, 69 bpm, nonspecific T wave abnormality  Relevant CV Studies:  Cath: 09/06/20  Scoring balloon angioplasty was performed.   Recurrent diffuse in-stent restenosis in the proximal to distal overlapping "full metal jacket" in the LAD.  TIMI grade II-III flow noted.   Overlapping scoring balloon angioplasty from the apical segment to the proximal segment in overlapping fashion reducing ISR to less than 50% proximally and less than 70% distally with TIMI grade III flow.   Widely patent left main   Proximal LAD outside the stented segment and extending to the ostium contains 50 to 60% narrowing    Native circumflex is still widely patent and has competitive flow from vein graft attached to the obtuse marginal   Native RCA and distal stent is widely patent   Saphenous vein graft to the obtuse marginal is widely patent   LIMA is atretic and was not selectively engaged.   Normal LV function and LVEDP   RECOMMENDATIONS: Resume Eliquis in a.m. Aspirin, Eliquis, and Effient for 1 week then drop aspirin Sheath pull at least 2 hours after Angiomax discontinuation. May consider referral for consideration of laser.  The nonexpandable region of overlapping stent in the mid vessel has already been treated with shockwave without improvement.  Diagnostic Dominance: Right Intervention    Laboratory Data:  High Sensitivity Troponin:   Recent Labs  Lab 09/29/20 1421  TROPONINIHS 6      Chemistry Recent Labs  Lab 09/29/20 1421  NA 133*  K 3.7  CL 97*  CO2 24  GLUCOSE 250*  BUN 20  CREATININE 1.14  CALCIUM 9.6  GFRNONAA >60  ANIONGAP 12    No results for input(s): PROT, ALBUMIN, AST, ALT, ALKPHOS, BILITOT in the last 168 hours. Lipids No results for input(s): CHOL, TRIG, HDL, LABVLDL, LDLCALC, CHOLHDL in the last 168 hours. Hematology Recent Labs  Lab 09/29/20 1421  WBC 7.6  RBC 4.05*  HGB 13.3  HCT 38.1*  MCV 94.1  MCH 32.8  MCHC 34.9  RDW 12.2  PLT 169   Thyroid No results for input(s): TSH, FREET4 in the last 168 hours. BNPNo results for input(s): BNP, PROBNP in the last 168 hours.  DDimer No results for input(s): DDIMER in the last 168 hours.   Radiology/Studies:  DG Chest 2 View  Result Date: 09/29/2020 CLINICAL DATA:  chest pain EXAM: CHEST - 2 VIEW COMPARISON:  None. FINDINGS: Right-sided transvenous catheter with tip overlying the right atrium. Coronary artery stents/graft. Implantable loop recorder. Cardiomediastinal silhouette is within normal limits. No pleural effusion no pneumothorax. No focal consolidative process. Median sternotomy wires without  acute osseous abnormality. IMPRESSION: No acute radiographic abnormality in the chest. Other stable findings as described. Electronically Signed   By: Albin Felling M.D.   On: 09/29/2020 15:14     Assessment and Plan:   Terry Nelson is a 65 y.o. male with CAD status post CABG ' 02, multiple PCI's, hypertension, hyperlipidemia, diabetes, CKD, PAF status post prior ablation on Eliquis who is being seen 09/29/2020 for the evaluation of chest pain.  Unstable Angina with complex hx of CAD: CABG '02 with multiple PCIs since that time. As above recently 8/30 with balloon angioplasty down entire metal jacket of LAD. Treated with triple therapy ASA/Eliquis/Effient for one week, then continued on Eliquis and Effient. Now with recurrent chest pain, taking multiple SL nitro/day.  -- admit -- hold Eliquis, plan for cardiac cath tomorrow afternoon. NPO at midnight -- cycle Troponins -- IV heparin per pharmD -- continue Effient, add ASA 81mg  while holding Eliquis  Shared Decision Making/Informed Consent{ The risks [stroke (1 in 1000), death (1 in 1000), kidney failure [usually temporary] (1 in 500), bleeding (1 in 200), allergic reaction [possibly serious] (1 in 200)], benefits (diagnostic support and management of coronary artery disease) and alternatives of a cardiac catheterization were discussed in detail with Terry Nelson and he is willing to proceed.  Chronic diastolic HF: does not appear volume loaded while in the ED. Will hold oral lasix with plans for cardiac cath  Paroxsymal Afib s/p ablation: Eliquis held on admission -- IV heparin per pharmD -- continue Toprol 200mg  daily  HTN: blood pressures are elevated in the ED -- continue home Toprol 200mg  daily, amlodipine 10mg  daily, Imdur 120mg  daily  DM:  -- SSI  HLD: continue atorvastatin 80mg  daily   Risk Assessment/Risk Scores:   HEAR Score (for undifferentiated chest pain):  HEAR Score: 3{  CHA2DS2-VASc Score = 3   This indicates a 3.2%  annual risk of stroke. The patient's score is based upon: CHF History: 1 HTN History: 1 Diabetes History: 1 Stroke History: 0 Vascular Disease History: 0 Age Score: 0 Gender Score: 0   Severity of Illness: The appropriate patient status for this patient is OBSERVATION. Observation status is judged to be reasonable and necessary in order to provide the required intensity of service to ensure the patient's safety. The patient's presenting symptoms, physical exam findings, and initial radiographic and laboratory data in the context of their medical condition is felt to place them at decreased risk for further clinical deterioration. Furthermore, it is anticipated that the patient will be medically stable for discharge from the hospital within 2 midnights of admission. The following factors support the patient status of observation.   " The patient's presenting symptoms include chest pain. " The physical exam findings include stable exam. " The initial radiographic and  laboratory data are stable labs.   For questions or updates, please contact [place] Please consult [email or web address] for contact info under     Signed, Reino Bellis, NP  09/29/2020 4:42 PM

## 2020-09-30 ENCOUNTER — Ambulatory Visit (HOSPITAL_COMMUNITY)
Admission: EM | Disposition: A | Discharge status: Home / Self Care | Payer: Self-pay | Source: Home / Self Care | Attending: Emergency Medicine

## 2020-09-30 ENCOUNTER — Observation Stay (HOSPITAL_BASED_OUTPATIENT_CLINIC_OR_DEPARTMENT_OTHER): Payer: BC Managed Care – PPO

## 2020-09-30 DIAGNOSIS — Z794 Long term (current) use of insulin: Secondary | ICD-10-CM | POA: Diagnosis not present

## 2020-09-30 DIAGNOSIS — I25710 Atherosclerosis of autologous vein coronary artery bypass graft(s) with unstable angina pectoris: Secondary | ICD-10-CM

## 2020-09-30 DIAGNOSIS — Z951 Presence of aortocoronary bypass graft: Secondary | ICD-10-CM | POA: Diagnosis not present

## 2020-09-30 DIAGNOSIS — Z7984 Long term (current) use of oral hypoglycemic drugs: Secondary | ICD-10-CM | POA: Diagnosis not present

## 2020-09-30 DIAGNOSIS — Z20822 Contact with and (suspected) exposure to covid-19: Secondary | ICD-10-CM | POA: Diagnosis not present

## 2020-09-30 DIAGNOSIS — Z955 Presence of coronary angioplasty implant and graft: Secondary | ICD-10-CM | POA: Diagnosis not present

## 2020-09-30 DIAGNOSIS — I25110 Atherosclerotic heart disease of native coronary artery with unstable angina pectoris: Secondary | ICD-10-CM

## 2020-09-30 DIAGNOSIS — I110 Hypertensive heart disease with heart failure: Secondary | ICD-10-CM | POA: Diagnosis not present

## 2020-09-30 DIAGNOSIS — Z79899 Other long term (current) drug therapy: Secondary | ICD-10-CM | POA: Diagnosis not present

## 2020-09-30 DIAGNOSIS — R079 Chest pain, unspecified: Secondary | ICD-10-CM | POA: Diagnosis not present

## 2020-09-30 DIAGNOSIS — E119 Type 2 diabetes mellitus without complications: Secondary | ICD-10-CM | POA: Diagnosis not present

## 2020-09-30 DIAGNOSIS — R0789 Other chest pain: Secondary | ICD-10-CM | POA: Diagnosis not present

## 2020-09-30 DIAGNOSIS — I200 Unstable angina: Secondary | ICD-10-CM | POA: Diagnosis not present

## 2020-09-30 DIAGNOSIS — I509 Heart failure, unspecified: Secondary | ICD-10-CM | POA: Diagnosis not present

## 2020-09-30 HISTORY — PX: LEFT HEART CATH AND CORS/GRAFTS ANGIOGRAPHY: CATH118250

## 2020-09-30 HISTORY — PX: INTRAVASCULAR PRESSURE WIRE/FFR STUDY: CATH118243

## 2020-09-30 LAB — GLUCOSE, CAPILLARY
Glucose-Capillary: 122 mg/dL — ABNORMAL HIGH (ref 70–99)
Glucose-Capillary: 97 mg/dL (ref 70–99)
Glucose-Capillary: 83 mg/dL (ref 70–99)
Glucose-Capillary: 97 mg/dL (ref 70–99)

## 2020-09-30 LAB — BASIC METABOLIC PANEL
Anion gap: 8 (ref 5–15)
BUN: 13 mg/dL (ref 8–23)
CO2: 27 mmol/L (ref 22–32)
Calcium: 9.7 mg/dL (ref 8.9–10.3)
Chloride: 101 mmol/L (ref 98–111)
Creatinine, Ser: 0.97 mg/dL (ref 0.61–1.24)
GFR, Estimated: >60 mL/min (ref >60)
Glucose, Bld: 155 mg/dL — ABNORMAL HIGH (ref 70–99)
Potassium: 3.6 mmol/L (ref 3.5–5.1)
Sodium: 136 mmol/L (ref 135–145)

## 2020-09-30 LAB — HEMOGLOBIN A1C
Hgb A1c MFr Bld: 6.9 % — ABNORMAL HIGH (ref 4.8–5.6)
Mean Plasma Glucose: 151.33 mg/dL

## 2020-09-30 LAB — CBC
HCT: 35.8 % — ABNORMAL LOW (ref 39.0–52.0)
Hemoglobin: 12.4 g/dL — ABNORMAL LOW (ref 13.0–17.0)
MCH: 32.5 pg (ref 26.0–34.0)
MCHC: 34.6 g/dL (ref 30.0–36.0)
MCV: 94 fL (ref 80.0–100.0)
Platelets: 165 10*3/uL (ref 150–400)
RBC: 3.81 MIL/uL — ABNORMAL LOW (ref 4.22–5.81)
RDW: 12.3 % (ref 11.5–15.5)
WBC: 9.3 10*3/uL (ref 4.0–10.5)
nRBC: 0 % (ref 0.0–0.2)

## 2020-09-30 LAB — BRAIN NATRIURETIC PEPTIDE: B Natriuretic Peptide: 98.2 pg/mL (ref 0.0–100.0)

## 2020-09-30 LAB — ECHOCARDIOGRAM COMPLETE
Area-P 1/2: 2.4 cm2
Height: 67 in
S' Lateral: 3.2 cm
Weight: 2576 oz

## 2020-09-30 LAB — POCT ACTIVATED CLOTTING TIME: Activated Clotting Time: 242 seconds

## 2020-09-30 LAB — APTT
aPTT: 103 seconds — ABNORMAL HIGH (ref 24–36)
aPTT: 73 seconds — ABNORMAL HIGH (ref 24–36)

## 2020-09-30 LAB — HEPARIN LEVEL (UNFRACTIONATED): Heparin Unfractionated: >1.1 IU/mL — ABNORMAL HIGH (ref 0.30–0.70)

## 2020-09-30 LAB — TROPONIN I (HIGH SENSITIVITY)
Troponin I (High Sensitivity): 6 ng/L (ref <18)
Troponin I (High Sensitivity): 6 ng/L (ref <18)

## 2020-09-30 SURGERY — LEFT HEART CATH AND CORS/GRAFTS ANGIOGRAPHY
Anesthesia: LOCAL

## 2020-09-30 MED ORDER — ONDANSETRON HCL 4 MG/2ML IJ SOLN: 4.0000 mg | Freq: Four times a day (QID) | INTRAMUSCULAR | Status: DC | PRN

## 2020-09-30 MED ORDER — SODIUM CHLORIDE 0.9% FLUSH: 3.0000 mL | INTRAVENOUS | Status: DC | PRN

## 2020-09-30 MED ORDER — SODIUM CHLORIDE 0.9% FLUSH: 3.0000 mL | Freq: Two times a day (BID) | INTRAVENOUS | Status: DC

## 2020-09-30 MED ORDER — MORPHINE SULFATE (PF) 2 MG/ML IV SOLN
2.0000 mg | Freq: Once | INTRAVENOUS | Status: AC
Start: 2020-09-30 — End: 2020-09-30
  Administered 2020-09-30: 2 mg via INTRAVENOUS
  Filled 2020-09-30: qty 1

## 2020-09-30 MED ORDER — LABETALOL HCL 5 MG/ML IV SOLN: 10.0000 mg | INTRAVENOUS | Status: AC | PRN

## 2020-09-30 MED ORDER — HEPARIN SODIUM (PORCINE) 1000 UNIT/ML IJ SOLN
INTRAMUSCULAR | Status: DC | PRN
  Administered 2020-09-30: 5000 [IU] via INTRAVENOUS
  Administered 2020-09-30: 2000 [IU] via INTRAVENOUS

## 2020-09-30 MED ORDER — FENTANYL CITRATE (PF) 100 MCG/2ML IJ SOLN
INTRAMUSCULAR | Status: AC
  Filled 2020-09-30: qty 2

## 2020-09-30 MED ORDER — LIDOCAINE HCL (PF) 1 % IJ SOLN
INTRAMUSCULAR | Status: AC
  Filled 2020-09-30: qty 30

## 2020-09-30 MED ORDER — TIZANIDINE HCL 4 MG PO TABS
4.0000 mg | ORAL_TABLET | Freq: Three times a day (TID) | ORAL | Status: DC
  Administered 2020-10-01 (×2): 4 mg via ORAL
  Filled 2020-09-30 (×6): qty 1

## 2020-09-30 MED ORDER — HEPARIN (PORCINE) IN NACL 2000-0.9 UNIT/L-% IV SOLN
INTRAVENOUS | Status: AC
  Filled 2020-09-30: qty 1000

## 2020-09-30 MED ORDER — MIDAZOLAM HCL 2 MG/2ML IJ SOLN
INTRAMUSCULAR | Status: AC
  Filled 2020-09-30: qty 2

## 2020-09-30 MED ORDER — HEPARIN SODIUM (PORCINE) 1000 UNIT/ML IJ SOLN
INTRAMUSCULAR | Status: AC
  Filled 2020-09-30: qty 1

## 2020-09-30 MED ORDER — MORPHINE SULFATE (PF) 2 MG/ML IV SOLN
2.0000 mg | Freq: Once | INTRAVENOUS | Status: AC
  Administered 2020-09-30: 2 mg via INTRAVENOUS
  Filled 2020-09-30: qty 1

## 2020-09-30 MED ORDER — SODIUM CHLORIDE 0.9 % IV SOLN: INTRAVENOUS | Status: AC

## 2020-09-30 MED ORDER — GABAPENTIN 400 MG PO CAPS
1200.0000 mg | ORAL_CAPSULE | Freq: Two times a day (BID) | ORAL | Status: DC
  Administered 2020-09-30 – 2020-10-01 (×3): 1200 mg via ORAL
  Filled 2020-09-30 (×2): qty 3
  Filled 2020-09-30: qty 12

## 2020-09-30 MED ORDER — IOHEXOL 350 MG/ML SOLN
INTRAVENOUS | Status: DC | PRN
  Administered 2020-09-30: 130 mL

## 2020-09-30 MED ORDER — ACETAMINOPHEN 325 MG PO TABS: 650.0000 mg | ORAL_TABLET | ORAL | Status: DC | PRN

## 2020-09-30 MED ORDER — MIDAZOLAM HCL 2 MG/2ML IJ SOLN
INTRAMUSCULAR | Status: DC | PRN
  Administered 2020-09-30 (×2): 1 mg via INTRAVENOUS

## 2020-09-30 MED ORDER — SODIUM CHLORIDE 0.9 % IV SOLN: 250.0000 mL | INTRAVENOUS | Status: DC | PRN

## 2020-09-30 MED ORDER — TIZANIDINE HCL 4 MG PO TABS
8.0000 mg | ORAL_TABLET | Freq: Every day | ORAL | Status: DC
  Filled 2020-09-30: qty 2

## 2020-09-30 MED ORDER — FENTANYL CITRATE (PF) 100 MCG/2ML IJ SOLN
INTRAMUSCULAR | Status: DC | PRN
  Administered 2020-09-30: 50 ug via INTRAVENOUS
  Administered 2020-09-30 (×2): 25 ug via INTRAVENOUS

## 2020-09-30 MED ORDER — MORPHINE SULFATE (PF) 2 MG/ML IV SOLN
2.0000 mg | Freq: Once | INTRAVENOUS | Status: AC
  Administered 2020-10-01: 2 mg via INTRAVENOUS
  Filled 2020-09-30: qty 1

## 2020-09-30 MED ORDER — LIDOCAINE HCL (PF) 1 % IJ SOLN
INTRAMUSCULAR | Status: DC | PRN
  Administered 2020-09-30: 15 mL

## 2020-09-30 MED ORDER — HEPARIN (PORCINE) IN NACL 2000-0.9 UNIT/L-% IV SOLN
INTRAVENOUS | Status: DC | PRN
  Administered 2020-09-30: 1000 mL

## 2020-09-30 MED ORDER — ONDANSETRON HCL 4 MG/2ML IJ SOLN
4.0000 mg | Freq: Once | INTRAMUSCULAR | Status: AC
  Administered 2020-09-30: 4 mg via INTRAVENOUS

## 2020-09-30 MED ORDER — HYDRALAZINE HCL 20 MG/ML IJ SOLN: 10.0000 mg | INTRAMUSCULAR | Status: AC | PRN

## 2020-09-30 MED ORDER — LIDOCAINE-EPINEPHRINE 1 %-1:100000 IJ SOLN
INTRAMUSCULAR | Status: AC
  Filled 2020-09-30: qty 1

## 2020-09-30 SURGICAL SUPPLY — 18 items
BNDG HMST LF TOP THROMBIX (HEMOSTASIS) ×1
CATH INFINITI 5FR MULTPACK ANG (CATHETERS) ×1 IMPLANT
CATH LAUNCHER 6FR EBU3.5 (CATHETERS) ×1 IMPLANT
CLOSURE PERCLOSE PROSTYLE (VASCULAR PRODUCTS) ×1 IMPLANT
FEM STOP ARCH (HEMOSTASIS) ×2
GUIDEWIRE PRESSURE X 175 (WIRE) ×1 IMPLANT
KIT HEART LEFT (KITS) ×3 IMPLANT
KIT HEMO VALVE WATCHDOG (MISCELLANEOUS) ×1 IMPLANT
KIT MICROPUNCTURE NIT STIFF (SHEATH) ×1 IMPLANT
PACK CARDIAC CATHETERIZATION (CUSTOM PROCEDURE TRAY) ×3 IMPLANT
PATCH THROMBIX TOPICAL PLAIN (HEMOSTASIS) ×1 IMPLANT
SHEATH PINNACLE 5F 10CM (SHEATH) ×1 IMPLANT
SHEATH PINNACLE 6F 10CM (SHEATH) ×1 IMPLANT
SHEATH PROBE COVER 6X72 (BAG) ×1 IMPLANT
SYSTEM COMPRESSION FEMOSTOP (HEMOSTASIS) IMPLANT
TRANSDUCER W/STOPCOCK (MISCELLANEOUS) ×3 IMPLANT
TUBING CIL FLEX 10 FLL-RA (TUBING) ×3 IMPLANT
WIRE J 3MM .035X145CM (WIRE) ×1 IMPLANT

## 2020-09-30 NOTE — Progress Notes (Signed)
Progress Note  Patient Name: Terry Nelson Date of Encounter: 09/30/2020  [facility] Cardiologist: Lesleigh Noe, MD   Subjective   CP from yesterday improved  Inpatient Medications    Scheduled Meds:  amLODipine  10 mg Oral Daily   aspirin  324 mg Oral NOW   Or   aspirin  300 mg Rectal NOW   aspirin EC  81 mg Oral Daily   atorvastatin  80 mg Oral QHS   gabapentin  1,200 mg Oral QHS   influenza vac split quadrivalent PF  0.5 mL Intramuscular Tomorrow-1000   insulin aspart  0-15 Units Subcutaneous TID WC   isosorbide mononitrate  120 mg Oral Daily   metoprolol  200 mg Oral Daily   pantoprazole  40 mg Oral Daily   prasugrel  10 mg Oral Daily   sodium chloride flush  3 mL Intravenous Q12H   spironolactone  25 mg Oral Daily   tiZANidine  8 mg Oral TID   Continuous Infusions:  sodium chloride     sodium chloride 1 mL/kg/hr (09/30/20 0509)   heparin 900 Units/hr (09/29/20 1731)   PRN Meds: sodium chloride, acetaminophen, nitroGLYCERIN, ondansetron (ZOFRAN) IV, sodium chloride flush   Vital Signs    Vitals:   09/29/20 2350 09/29/20 2355 09/30/20 0030 09/30/20 0400  BP: 103/63 102/62 105/60 (!) 101/58  Pulse: 67 65 63 (!) 57  Resp:    18  Temp:    97.9 F (36.6 C)  TempSrc:    Oral  SpO2: 97% 98% 100% 100%  Weight:      Height:        Intake/Output Summary (Last 24 hours) at 09/30/2020 1123 Last data filed at 09/30/2020 0005 Gross per 24 hour  Intake --  Output 875 ml  Net -875 ml   Last 3 Weights 09/29/2020 09/13/2020 09/06/2020  Weight (lbs) 161 lb 161 lb 155 lb  Weight (kg) 73.029 kg 73.029 kg 70.308 kg      Telemetry    NSR - Personally Reviewed  ECG    NSR, nonspecific ST changes - Personally Reviewed  Physical Exam   GEN: No acute distress.   Neck: No JVD Cardiac: RRR, no murmurs, rubs, or gallops.  Respiratory: Clear to auscultation bilaterally. GI: Soft, nontender, non-distended  MS: No edema; No deformity. Neuro:  Nonfocal   Psych: Normal affect   Labs    High Sensitivity Troponin:   Recent Labs  Lab 09/29/20 1421 09/29/20 1621 09/30/20 1011  TROPONINIHS 6 7 6      Chemistry Recent Labs  Lab 09/29/20 1421 09/29/20 2356  NA 133* 136  K 3.7 3.6  CL 97* 101  CO2 24 27  GLUCOSE 250* 155*  BUN 20 13  CREATININE 1.14 0.97  CALCIUM 9.6 9.7  GFRNONAA >60 >60  ANIONGAP 12 8    Lipids No results for input(s): CHOL, TRIG, HDL, LABVLDL, LDLCALC, CHOLHDL in the last 168 hours.  Hematology Recent Labs  Lab 09/29/20 1421 09/29/20 2356  WBC 7.6 9.3  RBC 4.05* 3.81*  HGB 13.3 12.4*  HCT 38.1* 35.8*  MCV 94.1 94.0  MCH 32.8 32.5  MCHC 34.9 34.6  RDW 12.2 12.3  PLT 169 165   Thyroid No results for input(s): TSH, FREET4 in the last 168 hours.  BNPNo results for input(s): BNP, PROBNP in the last 168 hours.  DDimer No results for input(s): DDIMER in the last 168 hours.   Radiology    DG Chest 2 View  Result Date: 09/29/2020 CLINICAL DATA:  chest pain EXAM: CHEST - 2 VIEW COMPARISON:  None. FINDINGS: Right-sided transvenous catheter with tip overlying the right atrium. Coronary artery stents/graft. Implantable loop recorder. Cardiomediastinal silhouette is within normal limits. No pleural effusion no pneumothorax. No focal consolidative process. Median sternotomy wires without acute osseous abnormality. IMPRESSION: No acute radiographic abnormality in the chest. Other stable findings as described. Electronically Signed   By: Olive Bass M.D.   On: 09/29/2020 15:14    Cardiac Studies   Prior cath films reviewed with Dr. Katrinka Blazing at the bedside  Patient Profile     65 y.o. male with CAD, s/p CABG  Assessment & Plan    Cath today.  I personally reviewed his films from August.  There is some ostial to proximal LAD disease which may be worth further evaluation.  Extensive stenting of the remainder of the LAD. Discussed at length with Dr. Katrinka Blazing and the patient.  Negative troponins despite rest pain.   Echo pending.  Further plans based on cath results.  For questions or updates, please contact [facility] Please consult [email or web address] for contact info under        Signed, Lance Muss, MD  09/30/2020, 11:23 AM

## 2020-09-30 NOTE — Progress Notes (Signed)
ANTICOAGULATION CONSULT NOTE  Pharmacy Consult for heparin Indication: chest pain/ACS  Allergies  Allergen Reactions   Metoclopramide Other (See Comments)    DYSKINESIAS   Penicillins Swelling and Rash    Did it involve swelling of the face/tongue/throat, SOB, or low BP? Yes Did it involve sudden or severe rash/hives, skin peeling, or any reaction on the inside of your mouth or nose? Unknown Did you need to seek medical attention at a hospital or doctor's office? Yes When did it last happen?More than 40 years ago    If all above answers are "NO", may proceed with cephalosporin use.     Patient Measurements: Height: 5\' 7"  (170.2 cm) Weight: 73 kg (161 lb) IBW/kg (Calculated) : 66.1 Heparin Dosing Weight: 73 kg  Vital Signs: Temp: 98.3 F (36.8 C) (09/22 2345) Temp Source: Oral (09/22 2345) BP: 105/60 (09/23 0030) Pulse Rate: 63 (09/23 0030)  Labs: Recent Labs    09/29/20 1421 09/29/20 1621 09/29/20 1644 09/29/20 2356  HGB 13.3  --   --  12.4*  HCT 38.1*  --   --  35.8*  PLT 169  --   --  165  APTT  --   --  30 73*  LABPROT  --   --  15.5*  --   INR  --   --  1.2  --   CREATININE 1.14  --   --   --   TROPONINIHS 6 7  --   --      Estimated Creatinine Clearance: 62 mL/min (by C-G formula based on SCr of 1.14 mg/dL).  Medical History: Past Medical History:  Diagnosis Date   Anxiety state    Arteriosclerosis of coronary artery    Body mass index (bmi) 26.0-26.9, adult    Chest pain    CHF (congestive heart failure) (HCC)    Chronic renal impairment    DDD (degenerative disc disease), lumbar    DM (diabetes mellitus) (HCC)    Gastritis    Headache    Hemorrhage    Hyperlipidemia    Hypertension    Kidney stones    Lumbago    Neck pain    Paroxysmal A-fib (HCC)    Pernicious anemia    Unstable angina (HCC)     Medications:  Infusions:   sodium chloride     sodium chloride     Followed by   sodium chloride     heparin 900 Units/hr (09/29/20  1731)    Assessment: 65 yo male presents with chest pain since his last [facility] 09/06/20. Patient takes Eliquis for paroxysmal atrial fibrillation, last dose 9/22 AM. Pharmacy consulted for heparin dosing.  CBC stable - Hgb 123.3, Plt 169. LHC scheduled for tomorrow, 9/23.  9/23 AM update:  aPTT therapeutic   Goal of Therapy:  Heparin level 0.3-0.7 units/mL aPTT 66-102 seconds Monitor platelets by anticoagulation protocol: Yes   Plan:  Cont heparin at 900 units/hr aPTT and heparin level with AM labs   11-05-1980, PharmD, BCPS Clinical Pharmacist Phone: [phone]

## 2020-09-30 NOTE — Progress Notes (Signed)
Patient checked on arrival to unit. Had bleeding in the cath lab and pressure held. Thrombin patch applied then dressed. No bleeding noted when checked. Bleeding noted, pressure applied to for 20 minutes. When pressure removed, site level 0 for 30 seconds and then oozed blood. Continued holding for another 20 minutes, charge nurse notified and requested that AP provider be notified. Laverda Page NP into see patient and treated with epi/lido injection. Dr. Eldridge Dace into see patient. Tight dressing applied.  Patient checked and noted to be oozing again, notified Angie Duke PA. She came to see patient and notified Virl Diamond from cath lab to bring a femostop for patient.  Applied to patient, Patient washed, gown and linens changed while applying femostop. Arlys John rechecked and adjusted femostop when noted blood leaking from bottom of device.  Kristopher Oppenheim present during femostop placement and report given. Patient resting.

## 2020-09-30 NOTE — Progress Notes (Signed)
Dr. Okey Dupre with cardiology notified about pt's persistent chest pain. EKG obtained NSR.  New order received for pt to have Morphine 2 mg IVP. Will continue to monitor pt for further complications. Pt stated that pain is now 4/10 see chart for details.

## 2020-09-30 NOTE — Progress Notes (Signed)
Echocardiogram 2D Echocardiogram has been performed.  Warren Lacy Doran Nestle RDCS 09/30/2020, 10:31 AM

## 2020-09-30 NOTE — Progress Notes (Signed)
   Called by RN with concerns of oozing from right groin post cath. Per-closure device used at site. Did have trouble with oozing while in the cath lab. RN held pressure at the bedside but continues with slow trickle of blood. No hematoma noted. Lidocaine with epi used to injection laterally to stick site for track ooze and site redressed with pressure dressing. RN at the bedside, updated on plan.   SignedLaverda Page, NP-C 09/30/2020, 5:26 PM

## 2020-09-30 NOTE — Progress Notes (Signed)
Pt stated took last dose of Eliquis on 9/22 in the am. This is mentioned in the cardiology's note when the NP rounded yesterday and in pharmacy's note as well.

## 2020-09-30 NOTE — Progress Notes (Addendum)
ANTICOAGULATION CONSULT NOTE  Pharmacy Consult for heparin Indication: chest pain/ACS  Allergies  Allergen Reactions   Metoclopramide Other (See Comments)    DYSKINESIAS   Penicillins Swelling and Rash    Did it involve swelling of the face/tongue/throat, SOB, or low BP? Yes Did it involve sudden or severe rash/hives, skin peeling, or any reaction on the inside of your mouth or nose? Unknown Did you need to seek medical attention at a hospital or doctor's office? Yes When did it last happen?More than 40 years ago    If all above answers are "NO", may proceed with cephalosporin use.     Patient Measurements: Height: 5\' 7"  (170.2 cm) Weight: 73 kg (161 lb) IBW/kg (Calculated) : 66.1 Heparin Dosing Weight: 73 kg  Vital Signs: Temp: 97.9 F (36.6 C) (09/23 0400) Temp Source: Oral (09/23 0400) BP: 101/58 (09/23 0400) Pulse Rate: 57 (09/23 0400)  Labs: Recent Labs    09/29/20 1421 09/29/20 1621 09/29/20 1644 09/29/20 2356 09/30/20 0439  HGB 13.3  --   --  12.4*  --   HCT 38.1*  --   --  35.8*  --   PLT 169  --   --  165  --   APTT  --   --  30 73*  --   LABPROT  --   --  15.5*  --   --   INR  --   --  1.2  --   --   HEPARINUNFRC  --   --   --   --  >1.10*  CREATININE 1.14  --   --  0.97  --   TROPONINIHS 6 7  --   --   --      Estimated Creatinine Clearance: 72.9 mL/min (by C-G formula based on SCr of 0.97 mg/dL).  Medical History: Past Medical History:  Diagnosis Date   Anxiety state    Arteriosclerosis of coronary artery    Body mass index (bmi) 26.0-26.9, adult    Chest pain    CHF (congestive heart failure) (HCC)    Chronic renal impairment    DDD (degenerative disc disease), lumbar    DM (diabetes mellitus) (HCC)    Gastritis    Headache    Hemorrhage    Hyperlipidemia    Hypertension    Kidney stones    Lumbago    Neck pain    Paroxysmal A-fib (HCC)    Pernicious anemia    Unstable angina (HCC)     Medications:  Infusions:   sodium  chloride     sodium chloride 1 mL/kg/hr (09/30/20 0509)   heparin 900 Units/hr (09/29/20 1731)    Assessment: 65 yo male presents with chest pain since his last [facility] 09/06/20. Patient takes Eliquis for paroxysmal atrial fibrillation, last dose 9/22 AM. PCI is planned today (9/23). Will monitor for anticoagulation plans after cath. Heparin level today is 103.  CBC stable with no reported signs or symptoms of bleeding.   Goal of Therapy:  Heparin level 0.3-0.7 units/mL aPTT 66-102 seconds Monitor platelets by anticoagulation protocol: Yes   Plan:  Decrease heparin at 850 units/hr aPTT and heparin level with AM labs  Thank you for allowing pharmacy to participate in this patient's care.  09-02-2003, PharmD PGY1 Pharmacy Resident 09/30/2020 5:19 AM Check [email or web address] for unit specific pharmacy number

## 2020-09-30 NOTE — Progress Notes (Signed)
Called to apply femstop for right groin oozing. Site previously injected with lido-epi.  No hematoma present. Site oozing steadily. Femostop applied and adjusted to .  BP 158 systolic. Right dp and pt pulses palpable. Condom cath applied and attached to drainage bag.   No additional oozing noted after final positioning of femostop.   Bedrest instructions given with respect to not moving his hips with the femostop in place.  Femostop applied at aprox 19:35:00

## 2020-09-30 NOTE — Interval H&P Note (Signed)
Cath Lab Visit (complete for each Cath Lab visit)  Clinical Evaluation Leading to the Procedure:   ACS: Yes.    Non-ACS:    Anginal Classification: CCS IV  Anti-ischemic medical therapy: Maximal Therapy (2 or more classes of medications)  Non-Invasive Test Results: No non-invasive testing performed  Prior CABG: Previous CABG      History and Physical Interval Note:  09/30/2020 12:06 PM  Terry Nelson  has presented today for surgery, with the diagnosis of chest pain.  The various methods of treatment have been discussed with the patient and family. After consideration of risks, benefits and other options for treatment, the patient has consented to  Procedure(s): LEFT HEART CATH AND CORS/GRAFTS ANGIOGRAPHY (N/A) as a surgical intervention.  The patient's history has been reviewed, patient examined, no change in status, stable for surgery.  I have reviewed the patient's chart and labs.  Questions were answered to the patient's satisfaction.     Lyn Records III

## 2020-09-30 NOTE — H&P (View-Only) (Signed)
Progress Note  Patient Name: Terry Nelson Date of Encounter: 09/30/2020  [facility] Cardiologist: Lesleigh Noe, MD   Subjective   CP from yesterday improved  Inpatient Medications    Scheduled Meds:  amLODipine  10 mg Oral Daily   aspirin  324 mg Oral NOW   Or   aspirin  300 mg Rectal NOW   aspirin EC  81 mg Oral Daily   atorvastatin  80 mg Oral QHS   gabapentin  1,200 mg Oral QHS   influenza vac split quadrivalent PF  0.5 mL Intramuscular Tomorrow-1000   insulin aspart  0-15 Units Subcutaneous TID WC   isosorbide mononitrate  120 mg Oral Daily   metoprolol  200 mg Oral Daily   pantoprazole  40 mg Oral Daily   prasugrel  10 mg Oral Daily   sodium chloride flush  3 mL Intravenous Q12H   spironolactone  25 mg Oral Daily   tiZANidine  8 mg Oral TID   Continuous Infusions:  sodium chloride     sodium chloride 1 mL/kg/hr (09/30/20 0509)   heparin 900 Units/hr (09/29/20 1731)   PRN Meds: sodium chloride, acetaminophen, nitroGLYCERIN, ondansetron (ZOFRAN) IV, sodium chloride flush   Vital Signs    Vitals:   09/29/20 2350 09/29/20 2355 09/30/20 0030 09/30/20 0400  BP: 103/63 102/62 105/60 (!) 101/58  Pulse: 67 65 63 (!) 57  Resp:    18  Temp:    97.9 F (36.6 C)  TempSrc:    Oral  SpO2: 97% 98% 100% 100%  Weight:      Height:        Intake/Output Summary (Last 24 hours) at 09/30/2020 1123 Last data filed at 09/30/2020 0005 Gross per 24 hour  Intake --  Output 875 ml  Net -875 ml   Last 3 Weights 09/29/2020 09/13/2020 09/06/2020  Weight (lbs) 161 lb 161 lb 155 lb  Weight (kg) 73.029 kg 73.029 kg 70.308 kg      Telemetry    NSR - Personally Reviewed  ECG    NSR, nonspecific ST changes - Personally Reviewed  Physical Exam   GEN: No acute distress.   Neck: No JVD Cardiac: RRR, no murmurs, rubs, or gallops.  Respiratory: Clear to auscultation bilaterally. GI: Soft, nontender, non-distended  MS: No edema; No deformity. Neuro:  Nonfocal   Psych: Normal affect   Labs    High Sensitivity Troponin:   Recent Labs  Lab 09/29/20 1421 09/29/20 1621 09/30/20 1011  TROPONINIHS 6 7 6      Chemistry Recent Labs  Lab 09/29/20 1421 09/29/20 2356  NA 133* 136  K 3.7 3.6  CL 97* 101  CO2 24 27  GLUCOSE 250* 155*  BUN 20 13  CREATININE 1.14 0.97  CALCIUM 9.6 9.7  GFRNONAA >60 >60  ANIONGAP 12 8    Lipids No results for input(s): CHOL, TRIG, HDL, LABVLDL, LDLCALC, CHOLHDL in the last 168 hours.  Hematology Recent Labs  Lab 09/29/20 1421 09/29/20 2356  WBC 7.6 9.3  RBC 4.05* 3.81*  HGB 13.3 12.4*  HCT 38.1* 35.8*  MCV 94.1 94.0  MCH 32.8 32.5  MCHC 34.9 34.6  RDW 12.2 12.3  PLT 169 165   Thyroid No results for input(s): TSH, FREET4 in the last 168 hours.  BNPNo results for input(s): BNP, PROBNP in the last 168 hours.  DDimer No results for input(s): DDIMER in the last 168 hours.   Radiology    DG Chest 2 View  Result Date: 09/29/2020 CLINICAL DATA:  chest pain EXAM: CHEST - 2 VIEW COMPARISON:  None. FINDINGS: Right-sided transvenous catheter with tip overlying the right atrium. Coronary artery stents/graft. Implantable loop recorder. Cardiomediastinal silhouette is within normal limits. No pleural effusion no pneumothorax. No focal consolidative process. Median sternotomy wires without acute osseous abnormality. IMPRESSION: No acute radiographic abnormality in the chest. Other stable findings as described. Electronically Signed   By: Olive Bass M.D.   On: 09/29/2020 15:14    Cardiac Studies   Prior cath films reviewed with Dr. Katrinka Blazing at the bedside  Patient Profile     65 y.o. male with CAD, s/p CABG  Assessment & Plan    Cath today.  I personally reviewed his films from August.  There is some ostial to proximal LAD disease which may be worth further evaluation.  Extensive stenting of the remainder of the LAD. Discussed at length with Dr. Katrinka Blazing and the patient.  Negative troponins despite rest pain.   Echo pending.  Further plans based on cath results.  For questions or updates, please contact [facility] Please consult [email or web address] for contact info under        Signed, Lance Muss, MD  09/30/2020, 11:23 AM

## 2020-09-30 NOTE — Interval H&P Note (Signed)
History and Physical Interval Note:  09/30/2020 1:02 PM  Terry Nelson  has presented today for surgery, with the diagnosis of chest pain.  The various methods of treatment have been discussed with the patient and family. After consideration of risks, benefits and other options for treatment, the patient has consented to  Procedure(s): LEFT HEART CATH AND CORS/GRAFTS ANGIOGRAPHY (N/A) as a surgical intervention.  The patient's history has been reviewed, patient examined, no change in status, stable for surgery.  I have reviewed the patient's chart and labs.  Questions were answered to the patient's satisfaction.    Cath Lab Visit (complete for each Cath Lab visit)  Clinical Evaluation Leading to the Procedure:   ACS: No.  Non-ACS:    Anginal Classification: CCS III  Anti-ischemic medical therapy: Maximal Therapy (2 or more classes of medications)  Non-Invasive Test Results: No non-invasive testing performed  Prior CABG: Previous CABG        Terry Nelson

## 2020-10-01 ENCOUNTER — Encounter (HOSPITAL_COMMUNITY): Payer: Self-pay | Admitting: Internal Medicine

## 2020-10-01 DIAGNOSIS — I25110 Atherosclerotic heart disease of native coronary artery with unstable angina pectoris: Secondary | ICD-10-CM | POA: Diagnosis not present

## 2020-10-01 DIAGNOSIS — I200 Unstable angina: Secondary | ICD-10-CM | POA: Diagnosis not present

## 2020-10-01 DIAGNOSIS — E119 Type 2 diabetes mellitus without complications: Secondary | ICD-10-CM | POA: Diagnosis not present

## 2020-10-01 DIAGNOSIS — I110 Hypertensive heart disease with heart failure: Secondary | ICD-10-CM | POA: Diagnosis not present

## 2020-10-01 DIAGNOSIS — Z20822 Contact with and (suspected) exposure to covid-19: Secondary | ICD-10-CM | POA: Diagnosis not present

## 2020-10-01 LAB — BASIC METABOLIC PANEL
Anion gap: 6 (ref 5–15)
BUN: 15 mg/dL (ref 8–23)
CO2: 26 mmol/L (ref 22–32)
Calcium: 9.3 mg/dL (ref 8.9–10.3)
Chloride: 106 mmol/L (ref 98–111)
Creatinine, Ser: 0.89 mg/dL (ref 0.61–1.24)
GFR, Estimated: >60 mL/min (ref >60)
Glucose, Bld: 107 mg/dL — ABNORMAL HIGH (ref 70–99)
Potassium: 3.8 mmol/L (ref 3.5–5.1)
Sodium: 138 mmol/L (ref 135–145)

## 2020-10-01 LAB — CBC
HCT: 33.9 % — ABNORMAL LOW (ref 39.0–52.0)
Hemoglobin: 12 g/dL — ABNORMAL LOW (ref 13.0–17.0)
MCH: 33.3 pg (ref 26.0–34.0)
MCHC: 35.4 g/dL (ref 30.0–36.0)
MCV: 94.2 fL (ref 80.0–100.0)
Platelets: 142 10*3/uL — ABNORMAL LOW (ref 150–400)
RBC: 3.6 MIL/uL — ABNORMAL LOW (ref 4.22–5.81)
RDW: 12.4 % (ref 11.5–15.5)
WBC: 11.8 10*3/uL — ABNORMAL HIGH (ref 4.0–10.5)
nRBC: 0 % (ref 0.0–0.2)

## 2020-10-01 LAB — GLUCOSE, CAPILLARY
Glucose-Capillary: 125 mg/dL — ABNORMAL HIGH (ref 70–99)
Glucose-Capillary: 99 mg/dL (ref 70–99)

## 2020-10-01 MED ORDER — OXYCODONE-ACETAMINOPHEN 5-325 MG PO TABS
1.0000 | ORAL_TABLET | ORAL | Status: DC | PRN
  Administered 2020-10-01: 1 via ORAL
  Filled 2020-10-01 (×4): qty 1

## 2020-10-01 MED ORDER — ACETAMINOPHEN 325 MG PO TABS: 650.0000 mg | ORAL_TABLET | ORAL | Status: DC | PRN

## 2020-10-01 NOTE — Discharge Summary (Signed)
Discharge Summary    Patient ID: Terry Nelson MRN: [id number]; DOB: 1955-06-15  Admit date: 09/29/2020 Discharge date: 10/01/2020  PCP:  Mila Palmer, PA-C   [facility] Providers Cardiologist:  Sinclair Grooms, MD        Discharge Diagnoses    Principal Problem:   Unstable angina [facility]) Active Problems:   Hyperlipidemia LDL goal <70   Coronary artery disease involving native coronary artery of native heart with unstable angina pectoris ([place])   Hypertension   PAC (premature atrial contraction)   PAF (paroxysmal atrial fibrillation) ([place])    Diagnostic Studies/Procedures    Cardiac cath 12/30/20   Relatively unchanged burden of disease with extensive stenting of the LAD; there is an area of the distal LAD with multiple layers of stent associated with a resting FFR of 0.86 (positive); resting FFR assessment proximal to this area was >0.90. Patent vein graft to OM. Mild disease of RCA.   RECOMMENDATION:  Given the recurrence of recoil in the distal LAD (with multiple layers of stent and prior treatment with Shockwave lithotripsy), consideration for either rotational or orbital atherectomy of this area will be discussed and considered.  For now, intensive medical therapy will be pursued.    Diagnostic Dominance: Right Left Anterior Descending  Ost LAD to Prox LAD lesion is 40% stenosed.  Prox LAD to Dist LAD lesion is 40% stenosed with 40% stenosed side branch in 2nd Diag. The lesion was previously treated .  Dist LAD-1 lesion is 75% stenosed. The lesion was previously treated .  Dist LAD-2 lesion is 50% stenosed.  Left Circumflex  Vessel is small.  First Obtuse Marginal Branch  1st Mrg lesion is 90% stenosed.  Second Obtuse Marginal Branch  2nd Mrg lesion is 25% stenosed.  Third Obtuse Marginal Branch  Vessel is small in size.  Right Coronary Artery  There is moderate diffuse disease throughout the vessel.  Dist RCA lesion is 20% stenosed. The lesion was  previously treated .  Right Posterior Descending Artery  There is moderate disease in the vessel.  RPDA lesion is 20% stenosed. The lesion was previously treated .  First Right Posterolateral Branch  Vessel is small in size.  Sequential Graft To 1st Mrg, 2nd Mrg  LIMA Graft To Mid LAD  And is small.  Mid Graft to Insertion lesion is 100% stenosed.    Diagnostic Dominance: Right  _____________   Echo 09/30/20 IMPRESSIONS     1. Left ventricular ejection fraction, by estimation, is 60 to 65%. The  left ventricle has normal function. The left ventricle has no regional  wall motion abnormalities. Left ventricular diastolic parameters are  indeterminate.   2. Right ventricular systolic function is normal. The right ventricular  size is normal. Tricuspid regurgitation signal is inadequate for assessing  PA pressure.   3. Left atrial size was moderately dilated.   4. The mitral valve is normal in structure. Trivial mitral valve  regurgitation. No evidence of mitral stenosis.   5. The aortic valve is grossly normal. There is mild calcification of the  aortic valve. Aortic valve regurgitation is not visualized. Mild aortic  valve sclerosis is present, with no evidence of aortic valve stenosis.   6. The inferior vena cava is normal in size with <50% respiratory  variability, suggesting right atrial pressure of 8 mmHg.   Comparison(s): Prior images unable to be directly viewed, comparison made  by report only.   Conclusion(s)/Recommendation(s): Otherwise normal echocardiogram, with  minor abnormalities described  in the report.   FINDINGS   Left Ventricle: Left ventricular ejection fraction, by estimation, is 60  to 65%. The left ventricle has normal function. The left ventricle has no  regional wall motion abnormalities. The left ventricular internal cavity  size was normal in size. There is   no left ventricular hypertrophy. Left ventricular diastolic parameters  are  indeterminate.   Right Ventricle: The right ventricular size is normal. Right vetricular  wall thickness was not well visualized. Right ventricular systolic  function is normal. Tricuspid regurgitation signal is inadequate for  assessing PA pressure.   Left Atrium: Left atrial size was moderately dilated.   Right Atrium: Right atrial size was normal in size.   Pericardium: There is no evidence of pericardial effusion.   Mitral Valve: The mitral valve is normal in structure. There is mild  thickening of the mitral valve leaflet(s). There is mild calcification of  the mitral valve leaflet(s). Trivial mitral valve regurgitation. No  evidence of mitral valve stenosis.   Tricuspid Valve: The tricuspid valve is grossly normal. Tricuspid valve  regurgitation is not demonstrated. No evidence of tricuspid stenosis.   Aortic Valve: The aortic valve is grossly normal. There is mild  calcification of the aortic valve. Aortic valve regurgitation is not  visualized. Mild aortic valve sclerosis is present, with no evidence of  aortic valve stenosis.   Pulmonic Valve: The pulmonic valve was not well visualized. Pulmonic valve  regurgitation is not visualized. No evidence of pulmonic stenosis.   Aorta: The aortic root was not well visualized and the ascending aorta was  not well visualized.   Venous: The inferior vena cava is normal in size with less than 50%  respiratory variability, suggesting right atrial pressure of 8 mmHg.   IAS/Shunts: The interatrial septum was not well visualized.      History of Present Illness     Terry Nelson is a 65 y.o. male with CAD status post CABG 2002, multiple PCI's, hypertension, hyperlipidemia, diabetes, CKD, PAF status post prior ablation on Eliquis and most recent cath 09/06/20 done for [place] and findings for diffuse in-stent restenosis in the proximal to distal overlapping full metal jacket LAD.  Had successful scoring balloon angioplasty from the apical  to proximal segment in overlapping fashion which reduced the in-stent restenosis to less than 50%, also noted to have patent SVG to OM, with atretic LIMA to LAD which was not engaged.  Normal LV function and LVEDP on cath.   He was placed on triple therapy with aspirin/Effient/Eliquis for a total of 1 week with plans to stop aspirin and continue his Eliquis and Effient.  He developed lower ext edema.  He could not be admitted duet to home issues,  meds adjusted.  He then began taking NTG freq. Imdur added.  By the 20th more freq episodes of pain and finally admitted the 22nd once he was able to find caregiver for his wife.  EKG in ER without acute changes, SR labs were stable.  He was admitted for unstable angina, held eliquis and IV heparin placed.  Effient was held ASA started.    Hospital Course     Consultants: none  Pt admitted and underwent cardiac cath 09/30/20.   Relatively unchanged burden of disease with extensive stenting of the LAD; there is an area of the distal LAD with multiple layers of stent associated with a resting FFR of 0.86 (positive); resting FFR assessment proximal to this area was >0.90.  Given the  recurrence of recoil in the distal LAD (with multiple layers of stent and prior treatment with Shockwave lithotripsy), consideration for either rotational or orbital atherectomy of this area will be discussed and considered.  For now, intensive medical therapy will be pursued.  Post cath developed bleed and had fem stop applied.  On exam by Dr. Johnsie Cancel groin was stable.  EF is stable.  Hgb repeat 12.o down from 12.4 but stable.  He was found stable for discharge by Dr. Johnsie Cancel.   Will continue his eliquis and effient and stop asa.  He will follow  up 10/03/20    Did the patient have an acute coronary syndrome (MI, NSTEMI, STEMI, etc) this admission?:  No                               Did the patient have a percutaneous coronary intervention (stent / angioplasty)?:  No.        _____________  Discharge Vitals Blood pressure (!) 93/56, pulse (!) 56, temperature 97.8 F (36.6 C), temperature source Oral, resp. rate 17, height $RemoveBe'5\' 7"'kZpjVkOUb$  (1.702 m), weight 73 kg, SpO2 100 %.  Filed Weights   09/29/20 1600  Weight: 73 kg    Labs & Radiologic Studies    CBC Recent Labs    09/29/20 2356 10/01/20 1129  WBC 9.3 11.8*  HGB 12.4* 12.0*  HCT 35.8* 33.9*  MCV 94.0 94.2  PLT 165 458*   Basic Metabolic Panel Recent Labs    09/29/20 2356 10/01/20 1129  NA 136 138  K 3.6 3.8  CL 101 106  CO2 27 26  GLUCOSE 155* 107*  BUN 13 15  CREATININE 0.97 0.89  CALCIUM 9.7 9.3   Liver Function Tests No results for input(s): AST, ALT, ALKPHOS, BILITOT, PROT, ALBUMIN in the last 72 hours. No results for input(s): LIPASE, AMYLASE in the last 72 hours. High Sensitivity Troponin:   Recent Labs  Lab 09/29/20 1421 09/29/20 1621 09/30/20 1011 09/30/20 1231  TROPONINIHS $RemoveBefo'6 7 6 6    'ZUvuNmjujDS$ BNP Invalid input(s): POCBNP D-Dimer No results for input(s): DDIMER in the last 72 hours. Hemoglobin A1C Recent Labs    09/29/20 2356  HGBA1C 6.9*   Fasting Lipid Panel No results for input(s): CHOL, HDL, LDLCALC, TRIG, CHOLHDL, LDLDIRECT in the last 72 hours. Thyroid Function Tests No results for input(s): TSH, T4TOTAL, T3FREE, THYROIDAB in the last 72 hours.  Invalid input(s): FREET3 _____________  DG Chest 2 View  Result Date: 09/29/2020 CLINICAL DATA:  chest pain EXAM: CHEST - 2 VIEW COMPARISON:  None. FINDINGS: Right-sided transvenous catheter with tip overlying the right atrium. Coronary artery stents/graft. Implantable loop recorder. Cardiomediastinal silhouette is within normal limits. No pleural effusion no pneumothorax. No focal consolidative process. Median sternotomy wires without acute osseous abnormality. IMPRESSION: No acute radiographic abnormality in the chest. Other stable findings as described. Electronically Signed   By: Albin Felling M.D.   On: 09/29/2020 15:14    CARDIAC CATHETERIZATION  Result Date: 09/30/2020  Relatively unchanged burden of disease with extensive stenting of the LAD; there is an area of the distal LAD with multiple layers of stent associated with a resting FFR of 0.86 (positive); resting FFR assessment proximal to this area was >0.90. Patent vein graft to OM. Mild disease of RCA. RECOMMENDATION:  Given the recurrence of recoil in the distal LAD (with multiple layers of stent and prior treatment with Shockwave lithotripsy), consideration for either rotational or  orbital atherectomy of this area will be discussed and considered.  For now, intensive medical therapy will be pursued.   CARDIAC CATHETERIZATION  Result Date: 09/06/2020   Scoring balloon angioplasty was performed.   Recurrent diffuse in-stent restenosis in the proximal to distal overlapping "full metal jacket" in the LAD.  TIMI grade II-III flow noted.   Overlapping scoring balloon angioplasty from the apical segment to the proximal segment in overlapping fashion reducing ISR to less than 50% proximally and less than 70% distally with TIMI grade III flow.   Widely patent left main   Proximal LAD outside the stented segment and extending to the ostium contains 50 to 60% narrowing   Native circumflex is still widely patent and has competitive flow from vein graft attached to the obtuse marginal   Native RCA and distal stent is widely patent   Saphenous vein graft to the obtuse marginal is widely patent   LIMA is atretic and was not selectively engaged.   Normal LV function and LVEDP RECOMMENDATIONS: Resume Eliquis in a.m. Aspirin, Eliquis, and Effient for 1 week then drop aspirin Sheath pull at least 2 hours after Angiomax discontinuation. May consider referral for consideration of laser.  The nonexpandable region of overlapping stent in the mid vessel has already been treated with shockwave without improvement.  ECHOCARDIOGRAM COMPLETE  Result Date: 09/30/2020    ECHOCARDIOGRAM REPORT    Patient Name:   Akai R Menge Date of Exam: 09/30/2020 Medical Rec #:  [id number]     Height:       67.0 in Accession #:    [id number]    Weight:       161.0 lb Date of Birth:  Sep 09, 1955     BSA:          1.844 m Patient Age:    65 years      BP:           87/58 mmHg Patient Gender: M             HR:           63 bpm. Exam Location:  Inpatient Procedure: 2D Echo, Color Doppler and Cardiac Doppler Indications:    R07.9* Chest pain, unspecified  History:        Patient has prior history of Echocardiogram examinations, most                 recent 05/28/2017. Prior CABG; Risk Factors:Hypertension,                 Diabetes and Dyslipidemia. Prior performed at [facility].  Sonographer:    Raquel Sarna Senior RDCS Referring Phys: [phone] [facility]  Sonographer Comments: Suboptimal parasternal window. IMPRESSIONS  1. Left ventricular ejection fraction, by estimation, is 60 to 65%. The left ventricle has normal function. The left ventricle has no regional wall motion abnormalities. Left ventricular diastolic parameters are indeterminate.  2. Right ventricular systolic function is normal. The right ventricular size is normal. Tricuspid regurgitation signal is inadequate for assessing PA pressure.  3. Left atrial size was moderately dilated.  4. The mitral valve is normal in structure. Trivial mitral valve regurgitation. No evidence of mitral stenosis.  5. The aortic valve is grossly normal. There is mild calcification of the aortic valve. Aortic valve regurgitation is not visualized. Mild aortic valve sclerosis is present, with no evidence of aortic valve stenosis.  6. The inferior vena cava is normal in size with <50% respiratory variability, suggesting right atrial pressure of  8 mmHg. Comparison(s): Prior images unable to be directly viewed, comparison made by report only. Conclusion(s)/Recommendation(s): Otherwise normal echocardiogram, with minor abnormalities described in the report. FINDINGS  Left Ventricle: Left ventricular  ejection fraction, by estimation, is 60 to 65%. The left ventricle has normal function. The left ventricle has no regional wall motion abnormalities. The left ventricular internal cavity size was normal in size. There is  no left ventricular hypertrophy. Left ventricular diastolic parameters are indeterminate. Right Ventricle: The right ventricular size is normal. Right vetricular wall thickness was not well visualized. Right ventricular systolic function is normal. Tricuspid regurgitation signal is inadequate for assessing PA pressure. Left Atrium: Left atrial size was moderately dilated. Right Atrium: Right atrial size was normal in size. Pericardium: There is no evidence of pericardial effusion. Mitral Valve: The mitral valve is normal in structure. There is mild thickening of the mitral valve leaflet(s). There is mild calcification of the mitral valve leaflet(s). Trivial mitral valve regurgitation. No evidence of mitral valve stenosis. Tricuspid Valve: The tricuspid valve is grossly normal. Tricuspid valve regurgitation is not demonstrated. No evidence of tricuspid stenosis. Aortic Valve: The aortic valve is grossly normal. There is mild calcification of the aortic valve. Aortic valve regurgitation is not visualized. Mild aortic valve sclerosis is present, with no evidence of aortic valve stenosis. Pulmonic Valve: The pulmonic valve was not well visualized. Pulmonic valve regurgitation is not visualized. No evidence of pulmonic stenosis. Aorta: The aortic root was not well visualized and the ascending aorta was not well visualized. Venous: The inferior vena cava is normal in size with less than 50% respiratory variability, suggesting right atrial pressure of 8 mmHg. IAS/Shunts: The interatrial septum was not well visualized.  LEFT VENTRICLE PLAX 2D LVIDd:         4.20 cm  Diastology LVIDs:         3.20 cm  LV e' medial:    7.51 cm/s LV PW:         0.80 cm  LV E/e' medial:  10.4 LV IVS:        1.00 cm  LV e'  lateral:   6.74 cm/s LVOT diam:     2.00 cm  LV E/e' lateral: 11.6 LV SV:         67 LV SV Index:   36 LVOT Area:     3.14 cm  RIGHT VENTRICLE RV S prime:     9.46 cm/s TAPSE (M-mode): 2.0 cm LEFT ATRIUM             Index       RIGHT ATRIUM           Index LA diam:        3.40 cm 1.84 cm/m  RA Area:     13.80 cm LA Vol (A2C):   70.1 ml 38.01 ml/m RA Volume:   31.20 ml  16.92 ml/m LA Vol (A4C):   82.0 ml 44.46 ml/m LA Biplane Vol: 77.8 ml 42.19 ml/m  AORTIC VALVE LVOT Vmax:   85.20 cm/s LVOT Vmean:  66.800 cm/s LVOT VTI:    0.213 m  AORTA Ao Root diam: 3.30 cm MITRAL VALVE MV Area (PHT): 2.40 cm    SHUNTS MV Decel Time: 316 msec    Systemic VTI:  0.21 m MV E velocity: 77.90 cm/s  Systemic Diam: 2.00 cm MV A velocity: 36.50 cm/s MV E/A ratio:  2.13 Buford Dresser MD Electronically signed by Buford Dresser MD Signature Date/Time: 09/30/2020/1:10:02 PM  Final    Disposition   Pt is being discharged home today in good condition.  Follow-up Plans & Appointments  Call [facility] at [phone] if any bleeding, swelling or drainage at cath site.  May shower, no tub baths for 48 hours for groin sticks. No lifting over 5 pounds for 3 days.  No Driving for 3 days  Heart healthy diabetic diet    Resume eliquis today with evening dose.     Keep appt on 9/26 to check groin      Follow-up Information     Belva Crome, MD Follow up on 10/03/2020.   Specialty: Cardiology Why: at 8:45 AM with his PA  Richardson Dopp. Contact information: [address] [place] [address] [phone]                  Discharge Medications   Allergies as of 10/01/2020       Reactions   Metoclopramide Other (See Comments)   DYSKINESIAS   Penicillins Swelling, Rash   Did it involve swelling of the face/tongue/throat, SOB, or low BP? Yes Did it involve sudden or severe rash/hives, skin peeling, or any reaction on the inside of your mouth or  nose? Unknown Did you need to seek medical attention at a hospital or doctor's office? Yes When did it last happen?More than 40 years ago    If all above answers are "NO", may proceed with cephalosporin use.        Medication List     TAKE these medications    acetaminophen 325 MG tablet Commonly known as: TYLENOL Take 2 tablets (650 mg total) by mouth every 4 (four) hours as needed for headache or mild pain.   amLODipine 10 MG tablet Commonly known as: NORVASC Take 10 mg by mouth daily.   apixaban 5 MG Tabs tablet Commonly known as: Eliquis Take 1 tablet (5 mg total) by mouth 2 (two) times daily.   atorvastatin 80 MG tablet Commonly known as: LIPITOR Take 80 mg by mouth at bedtime.   BD Pen Needle Nano U/F 32G X 4 MM Misc Generic drug: Insulin Pen Needle USE ONCE AS DIRECTED   Contour Next Monitor w/Device Kit daily.   Contour Next Test test strip Generic drug: glucose blood by Does not apply route.   furosemide 40 MG tablet Commonly known as: LASIX Take 1 tablet (40 mg total) by mouth 2 (two) times daily. What changed: when to take this   gabapentin 300 MG capsule Commonly known as: NEURONTIN Take 900-1,200 mg by mouth See admin instructions. Take 900 mg by mouth in the morning and 1200 mg at bedtime   glipiZIDE 10 MG 24 hr tablet Commonly known as: GLUCOTROL XL Take 10 mg by mouth at bedtime.   isosorbide mononitrate 60 MG 24 hr tablet Commonly known as: IMDUR Take 120 mg by mouth daily.   metoprolol 200 MG 24 hr tablet Commonly known as: Toprol XL Take 1 tablet (200 mg total) by mouth daily.   nitroGLYCERIN 0.4 MG SL tablet Commonly known as: NITROSTAT Place 1 tablet (0.4 mg total) under the tongue every 5 (five) minutes as needed for chest pain.   oxyCODONE-acetaminophen 10-325 MG tablet Commonly known as: PERCOCET Take 1 tablet by mouth every 4 (four) hours as needed for pain.   pantoprazole 40 MG tablet Commonly known as: PROTONIX Take 40  mg by mouth daily.   prasugrel 10 MG Tabs tablet Commonly known as:  EFFIENT Take 10 mg by mouth daily.   promethazine 25 MG tablet Commonly known as: PHENERGAN Take 25 mg by mouth 2 (two) times daily as needed for nausea or vomiting.   spironolactone 25 MG tablet Commonly known as: ALDACTONE Take 25 mg by mouth daily.   tiZANidine 4 MG tablet Commonly known as: ZANAFLEX Take 4-8 mg by mouth See admin instructions. Take 4 mg by mouth in the morning, afternoon and dinner and 8 mg bedtime   Toujeo SoloStar 300 UNIT/ML Solostar Pen Generic drug: insulin glargine (1 Unit Dial) Inject 32 Units into the skin daily.   traZODone 50 MG tablet Commonly known as: DESYREL Take 50 mg by mouth at bedtime as needed for sleep.           Outstanding Labs/Studies   BMP and CBC on outpt visit.   Duration of Discharge Encounter   Greater than 30 minutes including physician time.  Signed, Cecilie Kicks, NP 10/01/2020, 2:30 PM

## 2020-10-01 NOTE — Progress Notes (Signed)
Patient assessed at 8 am. Groin site bruised, no hematoma or bleeding. Right groin dressing gauze clean and dry, tegaderm secure. Pedal pulse present. Patient complaining of severe back pain. Slowly moved into a sitting position on side of bed. Stated he felt much better. After resting a few minutes, stood at side of bed, tolerating well. Sat in lounge chair and positioned for comfort. Right groin site checked with no change in assessment.

## 2020-10-01 NOTE — Progress Notes (Signed)
2694- Pt complained of having N/V. Zofran 4 mg IVP given. Pt's dressing to right femoral is CDI.  0600- Dressing to right femoral is CDI. Pt stated N/V improved somewhat.

## 2020-10-01 NOTE — Discharge Instructions (Signed)
Call [facility] at [phone] if any bleeding, swelling or drainage at cath site.  May shower, no tub baths for 48 hours for groin sticks. No lifting over 5 pounds for 3 days.  No Driving for 3 days  Heart healthy diabetic diet    Resume eliquis today with evening dose.     Keep appt on 9/26 to check groin

## 2020-10-01 NOTE — Progress Notes (Addendum)
2015-Pt is complaining of pain to incision site right femoral. Femstop in place no issues noted at this time. Old bloody drainage noted around site. New order received from Cardiology to give pt Morphine 2 mg IVP. SBP in the 130's. Pt being closely monitored. Pt verbalizes understanding to keep bed flat at this time.   2030-Femstop adjusted from 75 mm hg to 50 mm hg. No issues noted at site will continue to monitor. Pt became nauseous and dry heaving. Zofran 4 mg IVP given. Pt's site remains unchanged will continue to monitor very closely.  2130- Pt still having n/v bile fluid noted. Page Dr. Aron Baba about pt ordered received to give pt another dose of Zofran 4mg  IVP. Pt's site remains unchanged. VS stable SBP in the 130's-140's at times.   2230- Pt n/v resolved at this time. Pt site is unchanged adjusted Femstop from 50 mm Hg to 35 mm Hg. Will continue to monitor pt.  2345- Attempted to evaluate site for Femstop removal a very slow drip noted coming from site. Pressure increased to 50 mm Hg. No more bleeding noted from site. Will continue to monitor pt.   0030- All pressure released from Femstop slowly removed from site and with observation no bleeding noted at this time. Sterile 4x4's and Tegaderm applied to site. Educated pt on the importance of keeping BLE straightened and not to adjust bed at all to call nurse for assistance. Pt verbalizes understanding. Pt being very closely monitored.   0230- Pt has no issues noted dressing is CDI. Will continue to monitor closely.

## 2020-10-01 NOTE — Progress Notes (Signed)
Progress Note  Patient Name: Terry Nelson Date of Encounter: 10/01/2020  [facility] Cardiologist: Lesleigh Noe, MD   Subjective   Had femoral bleed last night fem stop applied Groin looks good this am but BP soft Convinced him to let iv team draw H/H this am   Inpatient Medications    Scheduled Meds:  amLODipine  10 mg Oral Daily   aspirin EC  81 mg Oral Daily   atorvastatin  80 mg Oral QHS   gabapentin  1,200 mg Oral BID   influenza vac split quadrivalent PF  0.5 mL Intramuscular Tomorrow-1000   insulin aspart  0-15 Units Subcutaneous TID WC   isosorbide mononitrate  120 mg Oral Daily   metoprolol  200 mg Oral Daily   pantoprazole  40 mg Oral Daily   prasugrel  10 mg Oral Daily   sodium chloride flush  3 mL Intravenous Q12H   sodium chloride flush  3 mL Intravenous Q12H   spironolactone  25 mg Oral Daily   tiZANidine  4 mg Oral TID   tiZANidine  8 mg Oral QHS   Continuous Infusions:  sodium chloride     PRN Meds: sodium chloride, acetaminophen, nitroGLYCERIN, ondansetron (ZOFRAN) IV, sodium chloride flush   Vital Signs    Vitals:   10/01/20 0210 10/01/20 0240 10/01/20 0310 10/01/20 0438  BP: (!) 141/68 (!) 151/74 137/69 140/71  Pulse:    82  Resp:    16  Temp:    99 F (37.2 C)  TempSrc:    Oral  SpO2:    98%  Weight:      Height:        Intake/Output Summary (Last 24 hours) at 10/01/2020 1121 Last data filed at 10/01/2020 0730 Gross per 24 hour  Intake --  Output 1150 ml  Net -1150 ml   Last 3 Weights 09/29/2020 09/13/2020 09/06/2020  Weight (lbs) 161 lb 161 lb 155 lb  Weight (kg) 73.029 kg 73.029 kg 70.308 kg      Telemetry    NSR - Personally Reviewed  ECG    NSR, nonspecific ST changes - Personally Reviewed  Physical Exam   Pale Lungs clear No murmur  RFA soft no hematoma  Abdomen benign   Labs    High Sensitivity Troponin:   Recent Labs  Lab 09/29/20 1421 09/29/20 1621 09/30/20 1011 09/30/20 1231  TROPONINIHS 6 7 6  6      Chemistry Recent Labs  Lab 09/29/20 1421 09/29/20 2356  NA 133* 136  K 3.7 3.6  CL 97* 101  CO2 24 27  GLUCOSE 250* 155*  BUN 20 13  CREATININE 1.14 0.97  CALCIUM 9.6 9.7  GFRNONAA >60 >60  ANIONGAP 12 8    Lipids No results for input(s): CHOL, TRIG, HDL, LABVLDL, LDLCALC, CHOLHDL in the last 168 hours.  Hematology Recent Labs  Lab 09/29/20 1421 09/29/20 2356  WBC 7.6 9.3  RBC 4.05* 3.81*  HGB 13.3 12.4*  HCT 38.1* 35.8*  MCV 94.1 94.0  MCH 32.8 32.5  MCHC 34.9 34.6  RDW 12.2 12.3  PLT 169 165   Thyroid No results for input(s): TSH, FREET4 in the last 168 hours.  BNP Recent Labs  Lab 09/30/20 1011  BNP 98.2    DDimer No results for input(s): DDIMER in the last 168 hours.   Radiology    DG Chest 2 View  Result Date: 09/29/2020 CLINICAL DATA:  chest pain EXAM: CHEST - 2 VIEW COMPARISON:  None. FINDINGS: Right-sided transvenous catheter with tip overlying the right atrium. Coronary artery stents/graft. Implantable loop recorder. Cardiomediastinal silhouette is within normal limits. No pleural effusion no pneumothorax. No focal consolidative process. Median sternotomy wires without acute osseous abnormality. IMPRESSION: No acute radiographic abnormality in the chest. Other stable findings as described. Electronically Signed   By: Olive Bass M.D.   On: 09/29/2020 15:14   CARDIAC CATHETERIZATION  Result Date: 09/30/2020  Relatively unchanged burden of disease with extensive stenting of the LAD; there is an area of the distal LAD with multiple layers of stent associated with a resting FFR of 0.86 (positive); resting FFR assessment proximal to this area was >0.90. Patent vein graft to OM. Mild disease of RCA. RECOMMENDATION:  Given the recurrence of recoil in the distal LAD (with multiple layers of stent and prior treatment with Shockwave lithotripsy), consideration for either rotational or orbital atherectomy of this area will be discussed and considered.  For  now, intensive medical therapy will be pursued.   ECHOCARDIOGRAM COMPLETE  Result Date: 09/30/2020    ECHOCARDIOGRAM REPORT   Patient Name:   Terry Nelson Date of Exam: 09/30/2020 Medical Rec #:  [id number]     Height:       67.0 in Accession #:    [id number]    Weight:       161.0 lb Date of Birth:  1955-11-07     BSA:          1.844 m Patient Age:    65 years      BP:           87/58 mmHg Patient Gender: M             HR:           63 bpm. Exam Location:  Inpatient Procedure: 2D Echo, Color Doppler and Cardiac Doppler Indications:    R07.9* Chest pain, unspecified  History:        Patient has prior history of Echocardiogram examinations, most                 recent 05/28/2017. Prior CABG; Risk Factors:Hypertension,                 Diabetes and Dyslipidemia. Prior performed at [facility].  Sonographer:    Irving Burton Senior RDCS Referring Phys: [id number] [facility] B ROBERTS  Sonographer Comments: Suboptimal parasternal window. IMPRESSIONS  1. Left ventricular ejection fraction, by estimation, is 60 to 65%. The left ventricle has normal function. The left ventricle has no regional wall motion abnormalities. Left ventricular diastolic parameters are indeterminate.  2. Right ventricular systolic function is normal. The right ventricular size is normal. Tricuspid regurgitation signal is inadequate for assessing PA pressure.  3. Left atrial size was moderately dilated.  4. The mitral valve is normal in structure. Trivial mitral valve regurgitation. No evidence of mitral stenosis.  5. The aortic valve is grossly normal. There is mild calcification of the aortic valve. Aortic valve regurgitation is not visualized. Mild aortic valve sclerosis is present, with no evidence of aortic valve stenosis.  6. The inferior vena cava is normal in size with <50% respiratory variability, suggesting right atrial pressure of 8 mmHg. Comparison(s): Prior images unable to be directly viewed, comparison made by report only.  Conclusion(s)/Recommendation(s): Otherwise normal echocardiogram, with minor abnormalities described in the report. FINDINGS  Left Ventricle: Left ventricular ejection fraction, by estimation, is 60 to 65%. The left ventricle has normal function. The left ventricle has no regional  wall motion abnormalities. The left ventricular internal cavity size was normal in size. There is  no left ventricular hypertrophy. Left ventricular diastolic parameters are indeterminate. Right Ventricle: The right ventricular size is normal. Right vetricular wall thickness was not well visualized. Right ventricular systolic function is normal. Tricuspid regurgitation signal is inadequate for assessing PA pressure. Left Atrium: Left atrial size was moderately dilated. Right Atrium: Right atrial size was normal in size. Pericardium: There is no evidence of pericardial effusion. Mitral Valve: The mitral valve is normal in structure. There is mild thickening of the mitral valve leaflet(s). There is mild calcification of the mitral valve leaflet(s). Trivial mitral valve regurgitation. No evidence of mitral valve stenosis. Tricuspid Valve: The tricuspid valve is grossly normal. Tricuspid valve regurgitation is not demonstrated. No evidence of tricuspid stenosis. Aortic Valve: The aortic valve is grossly normal. There is mild calcification of the aortic valve. Aortic valve regurgitation is not visualized. Mild aortic valve sclerosis is present, with no evidence of aortic valve stenosis. Pulmonic Valve: The pulmonic valve was not well visualized. Pulmonic valve regurgitation is not visualized. No evidence of pulmonic stenosis. Aorta: The aortic root was not well visualized and the ascending aorta was not well visualized. Venous: The inferior vena cava is normal in size with less than 50% respiratory variability, suggesting right atrial pressure of 8 mmHg. IAS/Shunts: The interatrial septum was not well visualized.  LEFT VENTRICLE PLAX 2D LVIDd:          4.20 cm  Diastology LVIDs:         3.20 cm  LV e' medial:    7.51 cm/s LV PW:         0.80 cm  LV E/e' medial:  10.4 LV IVS:        1.00 cm  LV e' lateral:   6.74 cm/s LVOT diam:     2.00 cm  LV E/e' lateral: 11.6 LV SV:         67 LV SV Index:   36 LVOT Area:     3.14 cm  RIGHT VENTRICLE RV S prime:     9.46 cm/s TAPSE (M-mode): 2.0 cm LEFT ATRIUM             Index       RIGHT ATRIUM           Index LA diam:        3.40 cm 1.84 cm/m  RA Area:     13.80 cm LA Vol (A2C):   70.1 ml 38.01 ml/m RA Volume:   31.20 ml  16.92 ml/m LA Vol (A4C):   82.0 ml 44.46 ml/m LA Biplane Vol: 77.8 ml 42.19 ml/m  AORTIC VALVE LVOT Vmax:   85.20 cm/s LVOT Vmean:  66.800 cm/s LVOT VTI:    0.213 m  AORTA Ao Root diam: 3.30 cm MITRAL VALVE MV Area (PHT): 2.40 cm    SHUNTS MV Decel Time: 316 msec    Systemic VTI:  0.21 m MV E velocity: 77.90 cm/s  Systemic Diam: 2.00 cm MV A velocity: 36.50 cm/s MV E/A ratio:  2.13 Jodelle Red MD Electronically signed by Jodelle Red MD Signature Date/Time: 09/30/2020/1:10:02 PM    Final     Cardiac Studies   Prior cath films reviewed with Dr. Katrinka Blazing at the bedside  Patient Profile     65 y.o. male with CAD, s/p CABG  Assessment & Plan    Cath yesterday with stable disease tightest disease is 75% in distal LAD which  is covered in layers of stent already. "Intense medical Rx " recommended Has been OOB to chair will check Hb/Hct make sure stable since he required fem stop last night Groin looks fine this am He r/o and TTE showed normal EF   D/c home if Hct 32 or >   Continue ASA/Effient imdur, norvasc and Toprol   For questions or updates, please contact [facility] Please consult [email or web address] for contact info under        Signed, Charlton Haws, MD  10/01/2020, 11:21 AM   Patient ID: Prudencio Pair, male   DOB: 21-Jun-1955, 65 y.o.   MRN: [id number]

## 2020-10-02 NOTE — Progress Notes (Signed)
Cardiology Office Note:    Date:  10/03/2020   ID:  [name], DOB 05/16/1955, MRN [id number]  PCP:  Terry Palmer, PA-C   [facility] Cardiologist:  Terry Grooms, MD    Referring MD: Terry Palmer, PA-C   Chief Complaint:  Hospitalization Follow-up (S/p PCI; readmit w relook cath - anatomy stable)    Patient Profile:   Terry Nelson is a 65 y.o. male with:  Coronary artery disease  S/p CABG in 2002 S/p multiple PCIs; extensive stenting of LAD ("full metal jacket") Cath 8/22: diffuse LAD ISR in prox to dist overlapping ("full metal jacket") >> scoring balloon POBA from apical to proximal segment in overlapping fashion Cath 9/22:  un ?'d burden of dz w extensive stenting of LAD; dLAD w resting FFR 0.86 (abnl) and prox to this area FFR >0.90>>consider rotational/orbital atherectomy vs med Rx  (HFpEF) heart failure with preserved ejection fraction  Diabetes mellitus  Hypertension  Hyperlipidemia  Paroxysmal atrial fibrillation  S/p PVI ablation  Chronic kidney disease    Prior CV studies: LEFT HEART CATH 09/30/2020 LAD ost 93, prox stent patent with 40 ISR, dist stent 75, 93 ISR; D2 40 OM1 90; OM2 25 RCA dist stent patent with 20 ISR; RPDA stent patent with 28 ISR  S-OM1/OM2 patent L-LAD 100 Narrative Relatively unchanged burden of disease with extensive stenting of the LAD; there is an area of the distal LAD with multiple layers of stent associated with a resting FFR of 0.86 (positive); resting FFR assessment proximal to this area was >0.90. Patent vein graft to OM. Mild disease of RCA.  RECOMMENDATION:  Given the recurrence of recoil in the distal LAD (with multiple layers of stent and prior treatment with Shockwave lithotripsy), consideration for either rotational or orbital atherectomy of this area will be discussed and considered.  For now, intensive medical therapy will be pursued.   LEFT HEART CATH 09/06/2020 Narrative   Scoring balloon  angioplasty was performed.   Recurrent diffuse in-stent restenosis in the proximal to distal overlapping "full metal jacket" in the LAD.  TIMI grade II-III flow noted.   Overlapping scoring balloon angioplasty from the apical segment to the proximal segment in overlapping fashion reducing ISR to less than 50% proximally and less than 70% distally with TIMI grade III flow.   Widely patent left main   Proximal LAD outside the stented segment and extending to the ostium contains 50 to 60% narrowing   Native circumflex is still widely patent and has competitive flow from vein graft attached to the obtuse marginal   Native RCA and distal stent is widely patent   Saphenous vein graft to the obtuse marginal is widely patent   LIMA is atretic and was not selectively engaged.   Normal LV function and LVEDP     ECHOCARDIOGRAM 09/30/20 EF 60-65, no RWMA, normal RVSF, moderate LAE, trivial MR, mild AV sclerosis without stenosis  NON-TELEMETRY MONITORING-INTERPRETATION ONLY 06/01/2020 Narrative  Normal sinus rhythm  Rare 4 beat VT salvo  SVT salvos up to 20 beats  No atrial fibrillation or sustained arrhythmia.    History of Present Illness: Mr. Terry Nelson underwent cardiac catheterization with overlapping scoring balloon angioplasty from the apical to proximal LAD in 8/22 for unstable angina.  After DC, he developed shortness of breath and leg edema.  He was seen by Dr. Radford Nelson in the office for evaluation 09/13/20.  Admission was recommended but he declined.  His diuretics were adjusted for volume overload  and admission was arranged for the next day.  However, his symptoms improved and admission was canceled.  However, he then developed worsening symptoms and was admitted 9/22-9/24 with unstable angina.  He ruled out for MI.  Cardiac catheterization was obtained and demonstrated fairly stable anatomy with multiple layers of stent in the distal LAD with FFR 0.86.   There was consideration for orbital  atherectomy but ultimately med Rx was planned.  Post cath, he had uncontrolled bleeding from the R FA site.  This was stopped with femstop and his Hgb remained stable.    He returns for f/u.  He is here alone.  He continues to have significant amounts of angina.  He has has not improvement in his symptoms.  He takes several SL NTG per day.  His weight has been stable.  His breathing is also stable.  He has not had orthopnea, leg edema, syncope.  His R groin is very painful since the bleed.  He notes bruising as well.  He has not had further bleeding.      Past Medical History:  Diagnosis Date   Anxiety state    Arteriosclerosis of coronary artery    Body mass index (bmi) 26.0-26.9, adult    Chest pain    CHF (congestive heart failure) (HCC)    Chronic renal impairment    DDD (degenerative disc disease), lumbar    DM (diabetes mellitus) (HCC)    Gastritis    Headache    Hemorrhage    Hyperlipidemia    Hypertension    Kidney stones    Lumbago    Neck pain    Paroxysmal A-fib (HCC)    Pernicious anemia    Unstable angina (HCC)    Current Medications: Current Meds  Medication Sig   acetaminophen (TYLENOL) 325 MG tablet Take 2 tablets (650 mg total) by mouth every 4 (four) hours as needed for headache or mild pain.   amLODipine (NORVASC) 10 MG tablet Take 10 mg by mouth daily.   apixaban (ELIQUIS) 5 MG TABS tablet Take 1 tablet (5 mg total) by mouth 2 (two) times daily.   atorvastatin (LIPITOR) 80 MG tablet Take 80 mg by mouth at bedtime.   Blood Glucose Monitoring Suppl (CONTOUR NEXT MONITOR) w/Device KIT daily.   furosemide (LASIX) 40 MG tablet Take 40 mg by mouth daily.   gabapentin (NEURONTIN) 300 MG capsule Take 900-1,200 mg by mouth See admin instructions. Take 900 mg by mouth in the morning and 1200 mg at bedtime   glipiZIDE (GLUCOTROL XL) 10 MG 24 hr tablet Take 10 mg by mouth at bedtime.   glucose blood (CONTOUR NEXT TEST) test strip by Does not apply route.   Insulin Pen  Needle (BD PEN NEEDLE NANO U/F) 32G X 4 MM MISC USE ONCE AS DIRECTED   isosorbide mononitrate (IMDUR) 60 MG 24 hr tablet Take 120 mg by mouth daily.   metoprolol (TOPROL XL) 200 MG 24 hr tablet Take 1 tablet (200 mg total) by mouth daily.   nitroGLYCERIN (NITROSTAT) 0.4 MG SL tablet Place 1 tablet (0.4 mg total) under the tongue every 5 (five) minutes as needed for chest pain.   oxyCODONE-acetaminophen (PERCOCET) 10-325 MG tablet Take 1 tablet by mouth every 4 (four) hours as needed for pain.   pantoprazole (PROTONIX) 40 MG tablet Take 40 mg by mouth daily.   prasugrel (EFFIENT) 10 MG TABS tablet Take 10 mg by mouth daily.   promethazine (PHENERGAN) 25 MG tablet Take 25 mg  by mouth 2 (two) times daily as needed for nausea or vomiting.   ranolazine (RANEXA) 500 MG 12 hr tablet Take 1 tablet (500 mg total) by mouth 2 (two) times daily.   spironolactone (ALDACTONE) 25 MG tablet Take 25 mg by mouth daily.   tiZANidine (ZANAFLEX) 4 MG tablet Take 4-8 mg by mouth See admin instructions. Take 4 mg by mouth in the morning, afternoon and dinner and 8 mg bedtime   TOUJEO SOLOSTAR 300 UNIT/ML Solostar Pen Inject 32 Units into the skin daily.   traZODone (DESYREL) 50 MG tablet Take 50 mg by mouth at bedtime as needed for sleep.    Allergies:   Metoclopramide and Penicillins   Social History   Tobacco Use   Smoking status: Never   Smokeless tobacco: Former    Types: Chew   Tobacco comments:    per pt chewed tobacco in high school   Vaping Use   Vaping Use: Never used  Substance Use Topics   Alcohol use: Never   Drug use: Never    Family Hx: The patient's family history includes Diabetes in his father; Heart disease in an other family member; High blood pressure in his mother; Stroke in his mother.  Review of Systems  Musculoskeletal:  Positive for back pain.    EKGs/Labs/Other Test Reviewed:    EKG:  EKG is  ordered today.  The ekg ordered today demonstrates NSR, HR 62, normal axis,  nonspecific ST-T wave changes, QTC 434  Recent Labs: 09/30/2020: B Natriuretic Peptide 98.2 10/01/2020: BUN 15; Creatinine, Ser 0.89; Hemoglobin 12.0; Platelets 142; Potassium 3.8; Sodium 138   Recent Lipid Panel Lab Results  Component Value Date/Time   CHOL 136 03/30/2019 12:34 PM   TRIG 178 (H) 03/30/2019 12:34 PM   HDL 36 (L) 03/30/2019 12:34 PM   LDLCALC 70 03/30/2019 12:34 PM     Risk Assessment/Calculations:    CHA2DS2-VASc Score = 4   This indicates a 4.8% annual risk of stroke. The patient's score is based upon: CHF History: 1 HTN History: 1 Diabetes History: 1 Stroke History: 0 Vascular Disease History: 1 Age Score: 0 Gender Score: 0         Physical Exam:    VS:  BP 116/60   Pulse 62   Ht $R'5\' 7"'XC$  (1.702 m)   Wt 153 lb 12.8 oz (69.8 kg)   SpO2 98%   BMI 24.09 kg/m     Wt Readings from Last 3 Encounters:  10/03/20 153 lb 12.8 oz (69.8 kg)  09/29/20 161 lb (73 kg)  09/13/20 161 lb (73 kg)    Constitutional:      Appearance: Healthy appearance. Not in distress.  Neck:     Vascular: JVD normal.  Pulmonary:     Effort: Pulmonary effort is normal.     Breath sounds: No wheezing. No rales.  Cardiovascular:     Normal rate. Regular rhythm. Normal S1. Normal S2.      Murmurs: There is no murmur.     Comments: Dressing from the hospital removed - R groin with mild to mod hematoma; mild ecchymosis noted, no bleeding; no pulsatile mass and no bruit Edema:    Peripheral edema absent.  Abdominal:     Palpations: Abdomen is soft.     Tenderness: There is no abdominal tenderness.  Skin:    General: Skin is warm and dry.  Neurological:     General: No focal deficit present.     Mental Status: Alert and  oriented to person, place and time.     Cranial Nerves: Cranial nerves are intact.        ASSESSMENT & PLAN:   1. Coronary artery disease involving native coronary artery of native heart with angina pectoris ([place]) Hx of CABG in 2002 and multiple PCI  procedures.   He has extensive stenting in the LAD and most recently had scoring balloon angioplasty in 8/22 from apical to proximal LAD.  His most recent cath several days ago demonstrated some worsening stenosis in the LAD but med Rx only has been recommended.  Rotational atherectomy has been considered but the risk of this is too great at this time.  His symptoms are overall stable since DC.  He does have a hematoma in his R groin from the groin bleed.  This appears stable without findings to suggest pseudoaneurysm.  Dr. Tamala Julian also saw the patient today.  -Continue Isosorbide 120 mg once daily, Toprol XL 200 mg once daily, Amlodipine 10 mg once daily   -Start trial of Ranolazine 500 mg twice daily   -EKG in 2-3 weeks  -F/u with Dr. Tamala Julian in Nov as planned.   2. Chronic heart failure with preserved ejection fraction (HCC) NYHA II.  Volume stable.  Continue current dose of furosemide.   3. PAF (paroxysmal atrial fibrillation) (HCC) Maintaining normal sinus rhythm.  Continue apixaban 5 mg twice daily.  CBC, BMET today  4. Essential hypertension The patient's blood pressure is controlled on his current regimen.  Continue current therapy.    5. Hyperlipidemia LDL goal <70 Continue atorvastatin 80 mg once daily.        Dispo:  Return in 7 weeks (on 11/23/2020) for Scheduled Follow Up w/ Dr. Tamala Julian .   Medication Adjustments/Labs and Tests Ordered: Current medicines are reviewed at length with the patient today.  Concerns regarding medicines are outlined above.  Tests Ordered: Orders Placed This Encounter  Procedures   Basic metabolic panel   CBC   EKG 12-Lead    Medication Changes: Meds ordered this encounter  Medications   ranolazine (RANEXA) 500 MG 12 hr tablet    Sig: Take 1 tablet (500 mg total) by mouth 2 (two) times daily.    Dispense:  180 tablet    Refill:  3    Signed, Richardson Dopp, PA-C  10/03/2020 12:52 PM    [facility] [address],  [place], [place]  [address] Phone: [phone]; Fax: [phone]

## 2020-10-03 ENCOUNTER — Other Ambulatory Visit: Payer: Self-pay

## 2020-10-03 ENCOUNTER — Encounter (HOSPITAL_COMMUNITY): Payer: Self-pay | Admitting: Internal Medicine

## 2020-10-03 ENCOUNTER — Ambulatory Visit (INDEPENDENT_AMBULATORY_CARE_PROVIDER_SITE_OTHER): Payer: BC Managed Care – PPO | Admitting: Physician Assistant

## 2020-10-03 ENCOUNTER — Telehealth: Payer: Self-pay | Admitting: Interventional Cardiology

## 2020-10-03 VITALS — BP 116/60 | HR 62 | Ht 67.0 in | Wt 153.8 lb

## 2020-10-03 DIAGNOSIS — I25119 Atherosclerotic heart disease of native coronary artery with unspecified angina pectoris: Secondary | ICD-10-CM | POA: Diagnosis not present

## 2020-10-03 DIAGNOSIS — I10 Essential (primary) hypertension: Secondary | ICD-10-CM

## 2020-10-03 DIAGNOSIS — I5032 Chronic diastolic (congestive) heart failure: Secondary | ICD-10-CM | POA: Diagnosis not present

## 2020-10-03 DIAGNOSIS — E785 Hyperlipidemia, unspecified: Secondary | ICD-10-CM

## 2020-10-03 DIAGNOSIS — I480 Paroxysmal atrial fibrillation: Secondary | ICD-10-CM

## 2020-10-03 DIAGNOSIS — I200 Unstable angina: Secondary | ICD-10-CM

## 2020-10-03 LAB — BASIC METABOLIC PANEL
BUN/Creatinine Ratio: 19 (ref 10–24)
BUN: 18 mg/dL (ref 8–27)
CO2: 25 mmol/L (ref 20–29)
Calcium: 9.5 mg/dL (ref 8.6–10.2)
Chloride: 99 mmol/L (ref 96–106)
Creatinine, Ser: 0.96 mg/dL (ref 0.76–1.27)
Glucose: 150 mg/dL — ABNORMAL HIGH (ref 70–99)
Potassium: 3.6 mmol/L (ref 3.5–5.2)
Sodium: 140 mmol/L (ref 134–144)
eGFR: 89 mL/min/{1.73_m2} (ref >59)

## 2020-10-03 LAB — CBC
Hematocrit: 36.2 % — ABNORMAL LOW (ref 37.5–51.0)
Hemoglobin: 12.3 g/dL — ABNORMAL LOW (ref 13.0–17.7)
MCH: 32.2 pg (ref 26.6–33.0)
MCHC: 34 g/dL (ref 31.5–35.7)
MCV: 95 fL (ref 79–97)
Platelets: 165 10*3/uL (ref 150–450)
RBC: 3.82 x10E6/uL — ABNORMAL LOW (ref 4.14–5.80)
RDW: 12.2 % (ref 11.6–15.4)
WBC: 7 10*3/uL (ref 3.4–10.8)

## 2020-10-03 MED ORDER — RANOLAZINE ER 500 MG PO TB12: 500.0000 mg | ORAL_TABLET | Freq: Two times a day (BID) | ORAL | 3 refills | Status: DC

## 2020-10-03 MED FILL — Fentanyl Citrate Preservative Free (PF) Inj 100 MCG/2ML: INTRAMUSCULAR | Qty: 2 | Status: AC

## 2020-10-03 MED FILL — Lidocaine Inj 1% w/ Epinephrine-1:100000: INTRAMUSCULAR | Qty: 20 | Status: AC

## 2020-10-03 NOTE — Patient Instructions (Addendum)
Medication Instructions:  Start Ranolazine (Ranexa) 500 mg twice a day   *If you need a refill on your cardiac medications before your next appointment, please call your pharmacy*   Lab Work: Bmp, Cbc - Today   If you have labs (blood work) drawn today and your tests are completely normal, you will receive your results only by: MyChart Message (if you have MyChart) OR A paper copy in the mail If you have any lab test that is abnormal or we need to change your treatment, we will call you to review the results.   Testing/Procedures: None    Follow-Up: Follow up as scheduled   Return in 3 weeks for a EKG   Other Instructions None

## 2020-10-03 NOTE — Telephone Encounter (Signed)
Spoke with Dr. Katrinka Blazing as I was unsure of what procedure the pt was referring to.  Dr. Katrinka Blazing states they spoke about orbital arthrectomy.  Dr. Katrinka Blazing wants pt to try the Ranexa first though as an orbital arthrectomy would be tough to do in his case.  Spoke with pt and made him aware of information.  Pt concerned because he will not have insurance starting November 1st.  Advised pt to try medication and come for EKG as scheduled on October 17th and if arthrectomy still needed, we should be able to get him scheduled for this before the end of October.  Pt verbalized understanding and was in agreement with plan.

## 2020-10-03 NOTE — Telephone Encounter (Signed)
New Message:       Patient wants Dr Katrinka Blazing to know that the procedure they discussed this morning that he ready to proceed with it. He says he would like to have it scheduled anytime before the end of October please.Terry Nelson

## 2020-10-19 DIAGNOSIS — E1159 Type 2 diabetes mellitus with other circulatory complications: Secondary | ICD-10-CM | POA: Diagnosis not present

## 2020-10-19 DIAGNOSIS — I10 Essential (primary) hypertension: Secondary | ICD-10-CM | POA: Diagnosis not present

## 2020-10-24 ENCOUNTER — Ambulatory Visit (INDEPENDENT_AMBULATORY_CARE_PROVIDER_SITE_OTHER): Payer: BC Managed Care – PPO | Admitting: *Deleted

## 2020-10-24 ENCOUNTER — Other Ambulatory Visit: Payer: Self-pay

## 2020-10-24 VITALS — HR 73 | Wt 150.6 lb

## 2020-10-24 DIAGNOSIS — I25119 Atherosclerotic heart disease of native coronary artery with unspecified angina pectoris: Secondary | ICD-10-CM | POA: Diagnosis not present

## 2020-10-24 NOTE — Progress Notes (Signed)
Reason for visit: Post Ranolazine start  Name of MD requesting visit: Scott Weaver/ Dr. Katrinka Blazing  H&P:  Coronary artery disease involving native coronary artery of native heart with angina pectoris (HCC) Hx of CABG in 2002 and multiple PCI procedures.   He has extensive stenting in the LAD and most recently had scoring balloon angioplasty in 8/22 from apical to proximal LAD.  His most recent cath several days ago demonstrated some worsening stenosis in the LAD but med Rx only has been recommended.  Rotational atherectomy has been considered but the risk of this is too great at this time.  His symptoms are overall stable since DC.  He does have a hematoma in his R groin from the groin bleed.  This appears stable without findings to suggest pseudoaneurysm.  Dr. Katrinka Blazing also saw the patient today.            -Continue Isosorbide 120 mg once daily, Toprol XL 200 mg once daily, Amlodipine 10 mg once daily              -Start trial of Ranolazine 500 mg twice daily              -EKG in 2-3 weeks             -F/u with Dr. Katrinka Blazing in Nov as planned.    ROS related to problem: The patient states he feels the same. No better, no worse.   Assessment and plan per MD: EKG okay to continue medication and planned follow up. Dr. Katrinka Blazing advised he would be in touch with the patient.

## 2020-10-24 NOTE — Patient Instructions (Signed)
Medication Instructions:  Continue current medication *If you need a refill on your cardiac medications before your next appointment, please call your pharmacy*   Lab Work: None ordered If you have labs (blood work) drawn today and your tests are completely normal, you will receive your results only by: MyChart Message (if you have MyChart) OR A paper copy in the mail If you have any lab test that is abnormal or we need to change your treatment, we will call you to review the results.   Testing/Procedures: None ordered   Follow-Up: At [facility], you and your health needs are our priority.  As part of our continuing mission to provide you with exceptional heart care, we have created designated Provider Care Teams.  These Care Teams include your primary Cardiologist (physician) and Advanced Practice Providers (APPs -  Physician Assistants and Nurse Practitioners) who all work together to provide you with the care you need, when you need it.  We recommend signing up for the patient portal called "MyChart".  Sign up information is provided on this After Visit Summary.  MyChart is used to connect with patients for Virtual Visits (Telemedicine).  Patients are able to view lab/test results, encounter notes, upcoming appointments, etc.  Non-urgent messages can be sent to your provider as well.   To learn more about what you can do with MyChart, go to [email or web address].    Your next appointment:   11/23/20 with Dr. Katrinka Blazing

## 2020-10-25 DIAGNOSIS — S0993XA Unspecified injury of face, initial encounter: Secondary | ICD-10-CM | POA: Diagnosis not present

## 2020-10-25 DIAGNOSIS — S01511A Laceration without foreign body of lip, initial encounter: Secondary | ICD-10-CM | POA: Diagnosis not present

## 2020-10-25 DIAGNOSIS — S01551A Open bite of lip, initial encounter: Secondary | ICD-10-CM | POA: Diagnosis not present

## 2020-10-25 DIAGNOSIS — W540XXA Bitten by dog, initial encounter: Secondary | ICD-10-CM | POA: Diagnosis not present

## 2020-10-25 DIAGNOSIS — Z23 Encounter for immunization: Secondary | ICD-10-CM | POA: Diagnosis not present

## 2020-10-25 DIAGNOSIS — W5581XA Bitten by other mammals, initial encounter: Secondary | ICD-10-CM | POA: Diagnosis not present

## 2020-10-25 NOTE — Telephone Encounter (Signed)
Nurse visit is in cc'd charts

## 2020-10-27 ENCOUNTER — Telehealth: Payer: Self-pay | Admitting: Interventional Cardiology

## 2020-10-27 NOTE — Telephone Encounter (Signed)
Patient called in needing to go the of the procedure that he is having done one the 26th. He has question/conerns. Please advise

## 2020-10-27 NOTE — Telephone Encounter (Signed)
Lyn Records, MD  He is set up for orbital atherectomy of LAD by [place] and [place]. It is set for 12 noon on 11/02/2020. I need to speak to him on Friday. I called today but he did not pick up and was unable to leave a message  Spoke to the patient and advised that Dr. Katrinka Blazing was trying to contact him today. Advise that Dr. Katrinka Blazing and or his nurse will contact him tomorrow.

## 2020-10-28 ENCOUNTER — Other Ambulatory Visit: Payer: Self-pay | Admitting: Interventional Cardiology

## 2020-10-28 NOTE — Telephone Encounter (Signed)
Dr. Katrinka Blazing spoke with the pt this morning and they have decided to cancel the arthrectomy.

## 2020-10-28 NOTE — Progress Notes (Signed)
Mr. Terry Nelson is being considered for high risk of labile orbital atherectomy to treat nonexpandable in-stent restenosis in the mid to distal LAD.  There are at least 2 layers of stent in the region that we have been unable to expand.  Admissions to the hospital have accelerated over the past 12 months and he has had at least 3 PCI procedures performed because of progressive symptoms.  His symptoms are probably cardiac although with episodes of chest pain and during hospitalizations, he has not had evidence of ischemia based upon objective criteria.  After researching, and considering management with our cardiac team including Dr. Alverda Skeans, Everette Rank, and myself, we have identified case reports of atherectomy use and settings of nondilated wall/poorly expanded coronary stents.  No control studies or large series exist.  Atherectomy in this situation is off label.  None of [place] have primary experience with this particular disease subset relative to atherectomy.  However, the patient has been pushing to "have something done".  This is been partially related to his fear that he will no longer be on his wife's insurance plan starting November 1.  In preparation for the procedure which is tentatively planned for 11/02/2020, we had a prolonged conversation this morning concerning the procedure and risks involved.  He was made aware that atherectomy in this setting is off label.  We do not have primary experience with this particular indication in our institution.  It has been performed in other places.  I estimated it and elevated risk perhaps 3-5 times higher than typical in the setting including the possibility of perforation, ischemic complications, bleeding, and possibly device entrapment without an opportunity to rescue.  A 3-5 times high risk of these complications would be in the 10 to 15% range.  A shared decision is made at this time that the patient will continue with medical therapy.  He actually  states that discomfort is less severe recently.  He is also suffering with an upper respiratory illness, and recent dog attack where he had lacerations in his face and about his eye.  In recuperating from this particular situation, he has not experienced any chest discomfort over the past week.  He would like to reserve an attempt at his coronary for intractable symptoms.  He does not feel he is at that point currently.  The planned procedure was canceled.

## 2020-11-02 ENCOUNTER — Encounter (HOSPITAL_COMMUNITY): Payer: Self-pay

## 2020-11-02 ENCOUNTER — Ambulatory Visit (HOSPITAL_COMMUNITY): Admit: 2020-11-02 | Payer: BC Managed Care – PPO | Admitting: Interventional Cardiology

## 2020-11-02 SURGERY — CORONARY ATHERECTOMY
Anesthesia: LOCAL

## 2020-11-04 DIAGNOSIS — K4090 Unilateral inguinal hernia, without obstruction or gangrene, not specified as recurrent: Secondary | ICD-10-CM | POA: Diagnosis not present

## 2020-11-04 DIAGNOSIS — N1330 Unspecified hydronephrosis: Secondary | ICD-10-CM | POA: Diagnosis not present

## 2020-11-04 DIAGNOSIS — N3090 Cystitis, unspecified without hematuria: Secondary | ICD-10-CM | POA: Diagnosis not present

## 2020-11-04 DIAGNOSIS — N3000 Acute cystitis without hematuria: Secondary | ICD-10-CM | POA: Diagnosis not present

## 2020-11-04 DIAGNOSIS — N2889 Other specified disorders of kidney and ureter: Secondary | ICD-10-CM | POA: Diagnosis not present

## 2020-11-04 DIAGNOSIS — K6389 Other specified diseases of intestine: Secondary | ICD-10-CM | POA: Diagnosis not present

## 2020-11-04 DIAGNOSIS — R300 Dysuria: Secondary | ICD-10-CM | POA: Diagnosis not present

## 2020-11-04 DIAGNOSIS — N3289 Other specified disorders of bladder: Secondary | ICD-10-CM | POA: Diagnosis not present

## 2020-11-04 DIAGNOSIS — R339 Retention of urine, unspecified: Secondary | ICD-10-CM | POA: Diagnosis not present

## 2020-11-21 NOTE — Progress Notes (Signed)
Cardiology Office Note:    Date:  11/23/2020   ID:  Terry Nelson, DOB 01/25/1955, MRN [id number]  PCP:  Jiles Harold, PA-C  Cardiologist:  Lesleigh Noe, MD   Referring MD: Jiles Harold, PA-C   Chief Complaint  Patient presents with   Coronary Artery Disease    History of Present Illness:    Terry Nelson is a 65 y.o. male with a hx of CABG in 2002 by Dr. Dorris Fetch, overlapping proximal to distal LAD stents with ISR, multiple subsequent balloon angioplasty procedures, PAF with prior ablation, HtN, DM II, HLD, and CKD.  Increasingly frequent reintervention over the past 12 months with angioplasty, scoring balloons, and lithotripsy but with ongoing symptoms.  High risk orbital atherectomy/rotational atherectomy for poorly expanded previously placed 2 layer stent offered but patient has not yet decided to move forward.  He is doing better.  He feels Ranexa has helped..  We discussed the orbital atherectomy.  He is somewhat leery of the off label aspects of it.  He now has dyspnea rather than pain.  The dyspnea could be an anginal equivalent.  He has experienced hematuria.  Has a kidney stone surgery.  Presented to [facility] with urinary retention.  Post void residual was 600 cc.  They felt that he may also have a urinary tract infection.  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  It is outstanding and the source of jealousy among my high school football teammates.  Past Medical History:  Diagnosis Date   Anxiety state    Arteriosclerosis of coronary artery    Body mass index (bmi) 26.0-26.9, adult    Chest pain    CHF (congestive heart failure) (HCC)    Chronic renal impairment    DDD (degenerative disc disease), lumbar    DM (diabetes mellitus) (HCC)    Gastritis    Headache    Hemorrhage    Hyperlipidemia    Hypertension    Kidney stones    Lumbago    Neck pain    Paroxysmal A-fib (HCC)    Pernicious anemia     Unstable angina (HCC)     Past Surgical History:  Procedure Laterality Date   ABLATION  2017   CARDIAC CATHETERIZATION     CORONARY ARTERY BYPASS GRAFT     CORONARY BALLOON ANGIOPLASTY N/A 04/27/2019   Procedure: CORONARY BALLOON ANGIOPLASTY;  Surgeon: Yvonne Kendall, MD;  Location: [facility] INVASIVE CV LAB;  Service: Cardiovascular;  Laterality: N/A;   CORONARY BALLOON ANGIOPLASTY N/A 12/16/2019   Procedure: CORONARY BALLOON ANGIOPLASTY;  Surgeon: Lyn Records, MD;  Location: [facility] INVASIVE CV LAB;  Service: Cardiovascular;  Laterality: N/A;   CORONARY BALLOON ANGIOPLASTY N/A 09/06/2020   Procedure: CORONARY BALLOON ANGIOPLASTY;  Surgeon: Lyn Records, MD;  Location: [facility] INVASIVE CV LAB;  Service: Cardiovascular;  Laterality: N/A;   INTRAVASCULAR PRESSURE WIRE/FFR STUDY N/A 09/30/2020   Procedure: INTRAVASCULAR PRESSURE WIRE/FFR STUDY;  Surgeon: [name], MD;  Location: [facility] INVASIVE CV LAB;  Service: Cardiovascular;  Laterality: N/A;   INTRAVASCULAR ULTRASOUND/IVUS N/A 04/27/2019   Procedure: Intravascular Ultrasound/IVUS;  Surgeon: Yvonne Kendall, MD;  Location: [facility] CV LAB;  Service: Cardiovascular;  Laterality: N/A;   INTRAVASCULAR ULTRASOUND/IVUS N/A 12/16/2019   Procedure: Intravascular Ultrasound/IVUS;  Surgeon: Lyn Records, MD;  Location: [facility] INVASIVE CV LAB;  Service: Cardiovascular;  Laterality: N/A;   LEFT HEART CATH AND CORS/GRAFTS ANGIOGRAPHY N/A 04/09/2019  Procedure: LEFT HEART CATH AND CORS/GRAFTS ANGIOGRAPHY;  Surgeon: Lyn Records, MD;  Location: [facility] INVASIVE CV LAB;  Service: Cardiovascular;  Laterality: N/A;   LEFT HEART CATH AND CORS/GRAFTS ANGIOGRAPHY N/A 12/16/2019   Procedure: LEFT HEART CATH AND CORS/GRAFTS ANGIOGRAPHY;  Surgeon: Lyn Records, MD;  Location: [facility];  Service: Cardiovascular;  Laterality: N/A;   LEFT HEART CATH AND CORS/GRAFTS ANGIOGRAPHY N/A 09/06/2020   Procedure: LEFT HEART CATH AND CORS/GRAFTS ANGIOGRAPHY;  Surgeon: Lyn Records, MD;  Location: [facility] CV [facility];  Service: Cardiovascular;  Laterality: N/A;   LEFT HEART CATH AND CORS/GRAFTS ANGIOGRAPHY N/A 09/30/2020   Procedure: LEFT HEART CATH AND CORS/GRAFTS ANGIOGRAPHY;  Surgeon: [name], MD;  Location: [facility] INVASIVE CV LAB;  Service: Cardiovascular;  Laterality: N/A;    Current Medications: Current Meds  Medication Sig   amLODipine (NORVASC) 10 MG tablet Take 10 mg by mouth daily.   apixaban (ELIQUIS) 5 MG TABS tablet Take 1 tablet (5 mg total) by mouth 2 (two) times daily.   atorvastatin (LIPITOR) 80 MG tablet Take 80 mg by mouth at bedtime.   Blood Glucose Monitoring Suppl (CONTOUR NEXT MONITOR) w/Device KIT daily.   furosemide (LASIX) 40 MG tablet Take 40 mg by mouth daily.   gabapentin (NEURONTIN) 300 MG capsule Take 1,200 mg by mouth 2 (two) times daily.   glipiZIDE (GLUCOTROL XL) 10 MG 24 hr tablet Take 10 mg by mouth at bedtime.   glucose blood (CONTOUR NEXT TEST) test strip by Does not apply route.   Insulin Pen Needle (BD PEN NEEDLE NANO U/F) 32G X 4 MM MISC USE ONCE AS DIRECTED   isosorbide mononitrate (IMDUR) 60 MG 24 hr tablet Take 120 mg by mouth daily.   metoprolol (TOPROL XL) 200 MG 24 hr tablet Take 1 tablet (200 mg total) by mouth daily.   nitroGLYCERIN (NITROSTAT) 0.4 MG SL tablet Place 1 tablet (0.4 mg total) under the tongue every 5 (five) minutes as needed for chest pain.   oxyCODONE-acetaminophen (PERCOCET) 10-325 MG tablet Take 1 tablet by mouth every 4 (four) hours as needed for pain.   pantoprazole (PROTONIX) 40 MG tablet Take 40 mg by mouth daily.   prasugrel (EFFIENT) 10 MG TABS tablet Take 10 mg by mouth daily.   promethazine (PHENERGAN) 25 MG tablet Take 25 mg by mouth 2 (two) times daily as needed for nausea or vomiting.   ranolazine (RANEXA) 500 MG 12 hr tablet Take 1 tablet (500 mg total) by mouth 2 (two) times daily.   spironolactone (ALDACTONE) 25 MG tablet Take 25 mg by mouth daily.   tamsulosin (FLOMAX) 0.4 MG  CAPS capsule Take 0.4 mg by mouth daily.   tiZANidine (ZANAFLEX) 4 MG tablet Take 4-8 mg by mouth See admin instructions. Take 4 mg by mouth in the morning, afternoon and dinner and 8 mg bedtime   TOUJEO SOLOSTAR 300 UNIT/ML Solostar Pen Inject 32 Units into the skin daily.   traZODone (DESYREL) 50 MG tablet Take 50 mg by mouth at bedtime as needed for sleep.     Allergies:   Metoclopramide and Penicillins   Social History   Socioeconomic History   Marital status: Married    Spouse name: Not on file   Number of children: Not on file   Years of education: Not on file   Highest education level: Not on file  Occupational History   Not on file  Tobacco Use   Smoking status: Never  Smokeless tobacco: Former    Types: Chew   Tobacco comments:    per pt chewed tobacco in high school   Vaping Use   Vaping Use: Never used  Substance and Sexual Activity   Alcohol use: Never   Drug use: Never   Sexual activity: Not on file    Comment: MARRIED  Other Topics Concern   Not on file  Social History Narrative   Not on file   Social Determinants of Health   Financial Resource Strain: Not on file  Food Insecurity: Not on file  Transportation Needs: Not on file  Physical Activity: Not on file  Stress: Not on file  Social Connections: Not on file     Family History: The patient's family history includes Diabetes in his father; Heart disease in an other family member; High blood pressure in his mother; Stroke in his mother.  ROS:   Please see the history of present illness.    History of UTI, kidney stones, but otherwise okay.  All other systems reviewed and are negative.  EKGs/Labs/Other Studies Reviewed:    The following studies were reviewed today: No new or recent imaging other than the coronary angiography was performed.  EKG:  EKG not done today.  Recent Labs: 09/30/2020: B Natriuretic Peptide 98.2 10/03/2020: BUN 18; Creatinine, Ser 0.96; Hemoglobin 12.3; Platelets 165;  Potassium 3.6; Sodium 140  Recent Lipid Panel    Component Value Date/Time   CHOL 136 03/30/2019 1234   TRIG 178 (H) 03/30/2019 1234   HDL 36 (L) 03/30/2019 1234   CHOLHDL 3.8 03/30/2019 1234   LDLCALC 70 03/30/2019 1234    Physical Exam:    VS:  BP 118/60   Pulse 71   Ht $R'5\' 7"'cH$  (1.702 m)   Wt 150 lb 11.2 oz (68.4 kg)   SpO2 99%   BMI 23.60 kg/m     Wt Readings from Last 3 Encounters:  11/23/20 150 lb 11.2 oz (68.4 kg)  10/24/20 150 lb 9.6 oz (68.3 kg)  10/03/20 153 lb 12.8 oz (69.8 kg)     GEN: Slender. No acute distress HEENT: Normal NECK: No JVD. LYMPHATICS: No lymphadenopathy CARDIAC: No murmur. RRR no gallop, or edema. VASCULAR:  Normal Pulses. No bruits. RESPIRATORY:  Clear to auscultation without rales, wheezing or rhonchi  ABDOMEN: Soft, non-tender, non-distended, No pulsatile mass, MUSCULOSKELETAL: No deformity  SKIN: Warm and dry NEUROLOGIC:  Alert and oriented x 3 PSYCHIATRIC:  Normal affect   ASSESSMENT:    1. Coronary artery disease involving native coronary artery of native heart with angina pectoris ([place])   2. Chronic heart failure with preserved ejection fraction ([place])   3. Essential hypertension   4. PAF (paroxysmal atrial fibrillation) ([place])   5. Hyperlipidemia LDL goal <70   6. Primary hypertension   7. Chronic anticoagulation    PLAN:    In order of problems listed above:  Secondary prevention discussed and understood by patient.  Increase Ranexa to 1000 mg twice a day. May need to consider SGLT2 therapy as the patient does have diastolic heart failure.  I think it may be ischemically mediated.  SGLT2 therapy might help.  We will hold this in advance for now. Continue current therapy.  Blood pressure control is stable. No recent instances of prolonged A. fib.  Continue apixaban. Continue aggressive lipid-lowering. Blood pressures well controlled. Continue apixaban and Effient.  Indication is atrial fibrillation and CAD/stents.  Now having  hematuria and needs to be seen by urology. Urology  consultation will be arranged.   Medication Adjustments/Labs and Tests Ordered: Current medicines are reviewed at length with the patient today.  Concerns regarding medicines are outlined above.  No orders of the defined types were placed in this encounter.  No orders of the defined types were placed in this encounter.   There are no Patient Instructions on file for this visit.   Signed, Sinclair Grooms, MD  11/23/2020 1:27 PM    [facility]

## 2020-11-23 ENCOUNTER — Ambulatory Visit (INDEPENDENT_AMBULATORY_CARE_PROVIDER_SITE_OTHER): Payer: BC Managed Care – PPO | Admitting: Interventional Cardiology

## 2020-11-23 ENCOUNTER — Encounter: Payer: Self-pay | Admitting: Interventional Cardiology

## 2020-11-23 ENCOUNTER — Other Ambulatory Visit: Payer: Self-pay

## 2020-11-23 VITALS — BP 118/60 | HR 71 | Ht 67.0 in | Wt 150.7 lb

## 2020-11-23 DIAGNOSIS — I480 Paroxysmal atrial fibrillation: Secondary | ICD-10-CM

## 2020-11-23 DIAGNOSIS — R319 Hematuria, unspecified: Secondary | ICD-10-CM

## 2020-11-23 DIAGNOSIS — I25119 Atherosclerotic heart disease of native coronary artery with unspecified angina pectoris: Secondary | ICD-10-CM | POA: Diagnosis not present

## 2020-11-23 DIAGNOSIS — I5032 Chronic diastolic (congestive) heart failure: Secondary | ICD-10-CM

## 2020-11-23 DIAGNOSIS — E785 Hyperlipidemia, unspecified: Secondary | ICD-10-CM

## 2020-11-23 DIAGNOSIS — I10 Essential (primary) hypertension: Secondary | ICD-10-CM | POA: Diagnosis not present

## 2020-11-23 DIAGNOSIS — I200 Unstable angina: Secondary | ICD-10-CM

## 2020-11-23 DIAGNOSIS — Z7901 Long term (current) use of anticoagulants: Secondary | ICD-10-CM

## 2020-11-23 MED ORDER — RANOLAZINE ER 1000 MG PO TB12: 1000.0000 mg | ORAL_TABLET | Freq: Two times a day (BID) | ORAL | 3 refills | Status: DC

## 2020-11-23 NOTE — Addendum Note (Signed)
Addended by: Julio Sicks on: 11/23/2020 01:55 PM   Modules accepted: Orders

## 2020-11-23 NOTE — Patient Instructions (Signed)
Medication Instructions:  Your physician has recommended you make the following change in your medication:  INCREASE Renexa to 1,000 mg take one tablet by mouth twice daily.  *If you need a refill on your cardiac medications before your next appointment, please call your pharmacy*   Lab Work: None  If you have labs (blood work) drawn today and your tests are completely normal, you will receive your results only by: MyChart Message (if you have MyChart) OR A paper copy in the mail If you have any lab test that is abnormal or we need to change your treatment, we will call you to review the results.   Follow-Up: At [facility], you and your health needs are our priority.  As part of our continuing mission to provide you with exceptional heart care, we have created designated Provider Care Teams.  These Care Teams include your primary Cardiologist (physician) and Advanced Practice Providers (APPs -  Physician Assistants and Nurse Practitioners) who all work together to provide you with the care you need, when you need it.  We recommend signing up for the patient portal called "MyChart".  Sign up information is provided on this After Visit Summary.  MyChart is used to connect with patients for Virtual Visits (Telemedicine).  Patients are able to view lab/test results, encounter notes, upcoming appointments, etc.  Non-urgent messages can be sent to your provider as well.   To learn more about what you can do with MyChart, go to [email or web address].    Your next appointment:   6 month(s)  The format for your next appointment:   In Person  Provider:   Lesleigh Noe, MD

## 2020-11-23 NOTE — Addendum Note (Signed)
Addended by: Julio Sicks on: 11/23/2020 01:50 PM   Modules accepted: Orders

## 2020-11-30 ENCOUNTER — Telehealth: Payer: Self-pay | Admitting: Interventional Cardiology

## 2020-11-30 NOTE — Telephone Encounter (Signed)
Spoke with pt and is not tolerating the 1000 mg of Ranexa Per pt has reduced to the 500 mg as previously taken and is tolerating does on occasion have chest pain but can rest and this resolves and is not having to take ntg as previously done prior to starting Ranexa .Pt states 1000 mg is causing h/a and neurological symptoms and is okay with taking the 500 mg Will forward to  Dr Katrinka Blazing for review .Zack Seal

## 2020-11-30 NOTE — Telephone Encounter (Signed)
Per Dr Katrinka Blazing okay for pt to stay on lower dose Pt aware ./cy

## 2020-11-30 NOTE — Telephone Encounter (Signed)
Pt c/o medication issue:  1. Name of Medication:  ranolazine (RANEXA) 1000 MG SR tablet  2. How are you currently taking this medication (dosage and times per day)?  As prescribed   3. Are you having a reaction (difficulty breathing--STAT)?  See below   4. What is your medication issue?   Patient states the increase from 500 MG to 100 MG is causing severe headaches. He states he dropped back down to 500 MG and would like to know if Dr. Katrinka Blazing has any other recommendations.

## 2020-12-22 ENCOUNTER — Telehealth: Payer: Self-pay | Admitting: Interventional Cardiology

## 2020-12-22 NOTE — Telephone Encounter (Signed)
Patient said that he is having problems paying for Eliquis. It went from being $50 to $500 very month.

## 2020-12-27 NOTE — Telephone Encounter (Signed)
**Note De-Identified Angelica Wix Obfuscation** The pt states that he is on his wife's insurance plan and that it re-set on 11/08/2020 but that he owes a deductible of $5000 that he cannot afford.  We discussed pt asst for Eliquis through [employer] but he was very reluctant to call them as he stated he and his wife's income is more than [employer]'s limit.  We then discussed him switching to Warfarin as it is the only generic anticoagulant on the market currently and that all others are name brand and will cost the same as Eliquis if not more.  He then requested [employer]'s phone number which I provided him.  He states that he will call them to discuss his eligibility to be approved for the [employer] Eliquis program and if he is eligible he is aware to request that they mail him an application to his home and that once he receives it to complete his part, obtain required documents per [employer], and to bring all to Dr [employer] office at [employer] at [employer] in [place] to drop off and that we will handle the providers page of his application and will fax all to [employer].  He thanked me for calling him back to discuss his options. This was a long call as he had a lot of questions

## 2020-12-28 NOTE — Telephone Encounter (Signed)
**Note De-Identified Edit Ricciardelli Obfuscation** The pt called me back today stating that his income amount is greater than the limit per [employer]. He requested that I do a tier exception to see if we can get his cost lowered.  I attempted to do this Eliquis tier exception through covermymeds but could not as a tier exception forfor commercial ins is not offered through them  I called [employer] at the number listed on the back of the pts BC[employer] Ins card and s/w Stephone who stated that I had to do this tier exception through [facility] and transferred my call to [facility] and I s/w Renee who stated that she did not handle tier exceptions and transferred me to [place] who advised me that she can only do prior authorizations and transferred me to [facility] who took my information and then stated that I would have to go to their portal to put in Dr Lonn [place] "new" info as he is not in their system.    I disputed this as I have been doing PAs for Dr Michaelle Copas pts through [facility] for years and his information is not new. She then asked me for our address and found that he was in their system. Ivy then stated that I would have to call [employer] back at the 1st phone number I called to do this tier exception as [employer] does not handle them.  I requested to s/w a manager/supervisor because I did not want to go through all of these steps again. Lajoyce Corners put me on hold for 10 mins then came back to the call and stated that a manager would have to call me back. I advised her that I will not be available today as I am clocking out and asked if the supervisor could call me tomorrow and she put me on hold again for more than 10 mins so I ended the call.

## 2020-12-28 NOTE — Telephone Encounter (Addendum)
He has Nurse, learning disability, should just be able to use the $10/month Eliquis copay card. I don't believe they have a monthly cap so it should work even with a high deductible plan.

## 2020-12-29 NOTE — Telephone Encounter (Signed)
**Note De-Identified Doreene Forrey Obfuscation** I provided the pt the phone number to reach out to Eliquis at [phone]-ELIQUIS [phone]) and their web site at [email or web address] so he can obtain a $10 Eliquis co-pay card.  He thanked me for my call and states that he will check into getting a Eliquis $10 co-pay card.

## 2021-01-03 ENCOUNTER — Telehealth: Payer: Self-pay | Admitting: Interventional Cardiology

## 2021-01-03 NOTE — Telephone Encounter (Signed)
,  Pt c/o of Chest Pain: STAT if CP now or developed within 24 hours  1. Are you having CP right now?  Not at this exact time  2. Are you experiencing any other symptoms (ex. SOB, nausea, vomiting, sweating)? Some nausea, yesterday for a long period of time  3. How long have you been experiencing CP? Last 3 days been just about unbearable  4. Is your CP continuous or coming and going? Pretty constant  5. Have you taken Nitroglycerin? yes ?

## 2021-01-03 NOTE — Telephone Encounter (Signed)
Called patient back about his message. Patient stated he has been having some more chest pain than usual during the last few days. Patient has decided to go ahead with the high risk orbital atherectomy/rotational atherectomy for poorly expanded previously placed 2 layer stent. Informed patient that Dr. Katrinka Blazing would need to discuss this with him and schedule him. Informed patient that Dr. Katrinka Blazing and his nurse will be able to get back to him next week. Patient stated that this would be fine, that he just knows it is time to do it. Encouraged patient to go to ED if his nitro does not help his chest pain. Also encouraged patient to take his full dose of ranexa to see if that will help. Patient verbalized understanding.

## 2021-01-03 NOTE — Telephone Encounter (Signed)
Per Dr. Katrinka Blazing "needs to be in hospital". Patient stated he is not going to hospital. Patient stated he is fine right now and he will go if things are bad. Patient stated he knows he is playing with fire, but he wants Dr. Katrinka Blazing to do this procedure and not anyone else. Encourage patient to go to hospital or go if his symptoms get work. Patient verbalized understanding.

## 2021-01-05 NOTE — Telephone Encounter (Signed)
Spoke with pt and check on him and see how he was doing.  States CP has been much better today.  Has not needed any Nitro.  Pain is worse with stress.  He tried to increase Ranexa to 1000mg  BID but states he can't tolerate due to HA and has to force movements of his arms and legs.  States his left arm feels heavy and tight.  We discussed the need to go to the ER again so he can be admitted and cathed.  Pt states he is not going to the ER because the last time he went the provider told him he didn't understand why he was there since he had already seen Cardiology.  States his grandbaby was born 12/28 and he was waiting for her to get here so he could make sure he lived to see her before doing any procedures.  Pt willing to move forward with arthrectomy now.  Pt is going to [place] on Monday to hopefully sell a home he has there.  Will be back that evening.  He would like to have the arthrectomy done in the next couple of weeks if Dr. Saturday is agreeable.  Pt is aware he will need to be seen before this can be scheduled.  He is agreeable to come in anytime after 1/3.  Advised I will update Dr. Katrinka Blazing and call back with information once I hear back.  Reiterated the importance of going to the hospital, especially if anything worsens.  Pt agreeable to plan.

## 2021-01-10 DIAGNOSIS — I5032 Chronic diastolic (congestive) heart failure: Secondary | ICD-10-CM | POA: Diagnosis not present

## 2021-01-10 DIAGNOSIS — I480 Paroxysmal atrial fibrillation: Secondary | ICD-10-CM | POA: Diagnosis not present

## 2021-01-10 DIAGNOSIS — F325 Major depressive disorder, single episode, in full remission: Secondary | ICD-10-CM | POA: Diagnosis not present

## 2021-01-10 DIAGNOSIS — Z79891 Long term (current) use of opiate analgesic: Secondary | ICD-10-CM | POA: Diagnosis not present

## 2021-01-10 DIAGNOSIS — Z0000 Encounter for general adult medical examination without abnormal findings: Secondary | ICD-10-CM | POA: Diagnosis not present

## 2021-01-10 NOTE — Telephone Encounter (Signed)
If chest pain is better, we should hold off on any procedure and try to avoid high stress situations.  ----- Message -----  From: Loren Racer, RN  Sent: 01/05/2021   5:19 PM EST  To: Belva Crome, MD, Loren Racer, RN   Spoke with pt and made him aware of information from Dr. Tamala Julian.  Pt states CP has been better over the weekend because he has been in [facility] all weekend.  Advised pt if he starts to notice increased CP again, please let us know.  Pt agreeable to plan.

## 2021-01-14 DIAGNOSIS — R0981 Nasal congestion: Secondary | ICD-10-CM | POA: Diagnosis not present

## 2021-01-14 DIAGNOSIS — R051 Acute cough: Secondary | ICD-10-CM | POA: Diagnosis not present

## 2021-01-14 DIAGNOSIS — R519 Headache, unspecified: Secondary | ICD-10-CM | POA: Diagnosis not present

## 2021-01-14 DIAGNOSIS — Z20828 Contact with and (suspected) exposure to other viral communicable diseases: Secondary | ICD-10-CM | POA: Diagnosis not present

## 2021-01-23 ENCOUNTER — Telehealth: Payer: Self-pay | Admitting: Interventional Cardiology

## 2021-01-23 MED ORDER — RANOLAZINE ER 1000 MG PO TB12: 1000.0000 mg | ORAL_TABLET | Freq: Two times a day (BID) | ORAL | 3 refills | Status: DC

## 2021-01-23 NOTE — Telephone Encounter (Signed)
Pt's medication was sent to pt's pharmacy as requested. Confirmation received.  °

## 2021-01-23 NOTE — Telephone Encounter (Signed)
°*  STAT* If patient is at the pharmacy, call can be transferred to refill team.   1. Which medications need to be refilled? (please list name of each medication and dose if known)  ranolazine (RANEXA) 500 MG SR tablet  2. Which pharmacy/location (including street and city if local pharmacy) is medication to be sent to?[facility] - [place], [place] - [address]  3. Do they need a 30 day or 90 day supply? 90 ds

## 2021-01-25 MED ORDER — RANOLAZINE ER 500 MG PO TB12: 500.0000 mg | ORAL_TABLET | Freq: Two times a day (BID) | ORAL | 6 refills | Status: DC

## 2021-01-25 NOTE — Telephone Encounter (Signed)
Pt reports it feels like "an Elephant standing on my chest" but it is because he has not had his Ranexa in several days. Pt will NOT go to ED. Correct Ranexa (500 mg BID) Rx sent to pharmacy. Advised to go to ED if worsens/changes in nature.  When ordering medication I was getting a flag that may not be covered by insurance.  Pt will call pharmacy to confirm no issues with picking it up, if so he will call us back and let us know if something further needs to be done on our part. Patient verbalized understanding and agreeable to plan.

## 2021-01-25 NOTE — Telephone Encounter (Signed)
Pt c/o of Chest Pain: STAT if CP now or developed within 24 hours  1. Are you having CP right now? Yes, has been having since the morning of 01/15   2. Are you experiencing any other symptoms (ex. SOB, nausea, vomiting, sweating)? Head hurts and is having nausea from all the nitroglycerin he has had to take   3. How long have you been experiencing CP? Since the morning of 01/22/21  4. Is your CP continuous or coming and going? Coming and going  5. Have you taken Nitroglycerin? Yes    Pt c/o medication issue:  1. Name of Medication:  ranolazine (RANEXA) 500 MG SR tablet  2. How are you currently taking this medication (dosage and times per day)?  Has not taken since 01/14  3. Are you having a reaction (difficulty breathing--STAT)? No   4. What is your medication issue? Patient is stating the pharmacy was sent 1000 MG's instead of 500 MG's. He reports he cannot take 1000 MG's due to neurological issues and that Dr. Katrinka Blazing knows this. Since not taking this medication due to not having the dosage he needs he has been having CP and having to take several nitro that are causing symptoms. Please advise.   Transferred to Triage.

## 2021-01-25 NOTE — Addendum Note (Signed)
Addended by: Baird Lyons on: 01/25/2021 03:16 PM   Modules accepted: Orders

## 2021-01-30 ENCOUNTER — Telehealth: Payer: Self-pay | Admitting: Interventional Cardiology

## 2021-01-30 NOTE — Telephone Encounter (Signed)
Spoke with wife and encouraged if pt is having that much CP and it has worsened since we last spoke, please go to ED for eval.  She states she will encourage pt but he will not likely go because he has had to go and sit there for hours the last 2 times he has went.  Scheduled pt to see Gillian Shields, PA-C tomorrow for eval in the event pt refuses to present to the ED today.  Wife states she has an appt in [place] tomorrow at 3pm so she is going to call and see about getting transportation to her appt so that he can see Luther Parody.  She will call in the morning if for some reason pt can't come.  Encouraged her again to have pt present to ED for eval today.  She said she will speak with pt but she doesn't think he will go.  Wife appreciative for call.

## 2021-01-30 NOTE — Telephone Encounter (Signed)
Pt c/o of Chest Pain: STAT if CP now or developed within 24 hours  1. Are you having CP right now? no  2. Are you experiencing any other symptoms (ex. SOB, nausea, vomiting, sweating)? Nausea and sob on exertion   3. How long have you been experiencing CP? A couple of weeks   4. Is your CP continuous or coming and going? Comes and goes   5. Have you taken Nitroglycerin? Yes  ?

## 2021-01-30 NOTE — Telephone Encounter (Signed)
Returned call to Pt's wife.  Per wife Pt has had a "tremendous" amount of chest pain.  He has been sick with covid the last couple of weeks, but symptoms resolved last week.  Per wife (with Pt in background) he has several occurrences of chest pain daily and take 10-12 Nitro tabs a day.  The chest pain is worse with exertion.  He is also short of breath.  Advised would send to Dr. Katrinka Blazing for review.

## 2021-01-31 ENCOUNTER — Ambulatory Visit (HOSPITAL_BASED_OUTPATIENT_CLINIC_OR_DEPARTMENT_OTHER): Payer: BC Managed Care – PPO | Admitting: Family

## 2021-01-31 MED ORDER — NITROGLYCERIN 0.4 MG SL SUBL: 0.4000 mg | SUBLINGUAL_TABLET | SUBLINGUAL | 2 refills | Status: DC | PRN

## 2021-01-31 NOTE — Telephone Encounter (Signed)
?  Transferred call to RN ?

## 2021-01-31 NOTE — Telephone Encounter (Signed)
Spoke with pt and he had several questions about being direct admitted and timeframe of being in the hospital.  Answered pt's questions and he was agreeable to being direct admitted.  Called over to the hospital and got him on the board to be admitted. No rooms available right now.  Message sent to provider to make him aware that it may be tomorrow before they can get pt admitted.

## 2021-01-31 NOTE — Telephone Encounter (Signed)
He needs to be admitted in next 24 hours. Will need to set up high risk PCI.  ----- Message -----  From: Wiliam Ke, RN  Sent: 01/30/2021   3:58 PM EST  To: Lyn Records, MD, Julio Sicks, RN   Spoke with wife and made her aware of recommendation per Dr. Katrinka Blazing.  Explained the reasoning behind admitting with a plan for PCI later this week.  Pt wasn't with wife at this time.  She is going to speak with him and then call me back shortly and let me know their decision.  Will keep appt with Gillian Shields for now and cancel if pt agreeable to being direct admitted.

## 2021-01-31 NOTE — Telephone Encounter (Signed)
Spoke with pt and made him aware that he is down for direct admission.  He is aware that the hospital will contact him when he has a bed available.  Pt aware of risks of staying home while waiting for bed.  He states if anything changes he will call EMS to be transported to [facility].  Pt very appreciative for help.

## 2021-02-01 ENCOUNTER — Inpatient Hospital Stay (HOSPITAL_COMMUNITY)
Admission: AD | Admit: 2021-02-01 | Discharge: 2021-02-03 | DRG: 291 | Disposition: A | Discharge status: Home / Self Care | Payer: BC Managed Care – PPO | Attending: Interventional Cardiology | Admitting: Interventional Cardiology

## 2021-02-01 ENCOUNTER — Encounter (HOSPITAL_COMMUNITY): Payer: Self-pay | Admitting: Interventional Cardiology

## 2021-02-01 DIAGNOSIS — Z79899 Other long term (current) drug therapy: Secondary | ICD-10-CM | POA: Diagnosis not present

## 2021-02-01 DIAGNOSIS — Z880 Allergy status to penicillin: Secondary | ICD-10-CM

## 2021-02-01 DIAGNOSIS — I480 Paroxysmal atrial fibrillation: Secondary | ICD-10-CM | POA: Diagnosis not present

## 2021-02-01 DIAGNOSIS — I10 Essential (primary) hypertension: Secondary | ICD-10-CM | POA: Diagnosis present

## 2021-02-01 DIAGNOSIS — E785 Hyperlipidemia, unspecified: Secondary | ICD-10-CM | POA: Diagnosis not present

## 2021-02-01 DIAGNOSIS — F411 Generalized anxiety disorder: Secondary | ICD-10-CM | POA: Diagnosis present

## 2021-02-01 DIAGNOSIS — I130 Hypertensive heart and chronic kidney disease with heart failure and stage 1 through stage 4 chronic kidney disease, or unspecified chronic kidney disease: Secondary | ICD-10-CM | POA: Diagnosis not present

## 2021-02-01 DIAGNOSIS — I25110 Atherosclerotic heart disease of native coronary artery with unstable angina pectoris: Secondary | ICD-10-CM | POA: Diagnosis present

## 2021-02-01 DIAGNOSIS — Z833 Family history of diabetes mellitus: Secondary | ICD-10-CM | POA: Diagnosis not present

## 2021-02-01 DIAGNOSIS — R011 Cardiac murmur, unspecified: Secondary | ICD-10-CM | POA: Diagnosis not present

## 2021-02-01 DIAGNOSIS — Z794 Long term (current) use of insulin: Secondary | ICD-10-CM

## 2021-02-01 DIAGNOSIS — N182 Chronic kidney disease, stage 2 (mild): Secondary | ICD-10-CM | POA: Diagnosis present

## 2021-02-01 DIAGNOSIS — I5033 Acute on chronic diastolic (congestive) heart failure: Secondary | ICD-10-CM | POA: Diagnosis not present

## 2021-02-01 DIAGNOSIS — Z8616 Personal history of COVID-19: Secondary | ICD-10-CM

## 2021-02-01 DIAGNOSIS — E1122 Type 2 diabetes mellitus with diabetic chronic kidney disease: Secondary | ICD-10-CM | POA: Diagnosis present

## 2021-02-01 DIAGNOSIS — Z7901 Long term (current) use of anticoagulants: Secondary | ICD-10-CM | POA: Diagnosis not present

## 2021-02-01 DIAGNOSIS — Z7984 Long term (current) use of oral hypoglycemic drugs: Secondary | ICD-10-CM

## 2021-02-01 DIAGNOSIS — Z87442 Personal history of urinary calculi: Secondary | ICD-10-CM

## 2021-02-01 DIAGNOSIS — D509 Iron deficiency anemia, unspecified: Secondary | ICD-10-CM | POA: Diagnosis not present

## 2021-02-01 DIAGNOSIS — I5031 Acute diastolic (congestive) heart failure: Secondary | ICD-10-CM | POA: Diagnosis not present

## 2021-02-01 DIAGNOSIS — R0609 Other forms of dyspnea: Secondary | ICD-10-CM | POA: Diagnosis not present

## 2021-02-01 DIAGNOSIS — E11649 Type 2 diabetes mellitus with hypoglycemia without coma: Secondary | ICD-10-CM | POA: Diagnosis not present

## 2021-02-01 DIAGNOSIS — M5136 Other intervertebral disc degeneration, lumbar region: Secondary | ICD-10-CM | POA: Diagnosis present

## 2021-02-01 DIAGNOSIS — I200 Unstable angina: Secondary | ICD-10-CM

## 2021-02-01 DIAGNOSIS — Z951 Presence of aortocoronary bypass graft: Secondary | ICD-10-CM

## 2021-02-01 DIAGNOSIS — Z888 Allergy status to other drugs, medicaments and biological substances status: Secondary | ICD-10-CM | POA: Diagnosis not present

## 2021-02-01 HISTORY — DX: Chronic kidney disease, stage 2 (mild): N18.2

## 2021-02-01 HISTORY — DX: Atherosclerotic heart disease of native coronary artery without angina pectoris: I25.10

## 2021-02-01 HISTORY — DX: Chronic diastolic (congestive) heart failure: I50.32

## 2021-02-01 LAB — COMPREHENSIVE METABOLIC PANEL
ALT: 14 U/L (ref 0–44)
AST: 23 U/L (ref 15–41)
Albumin: 4.3 g/dL (ref 3.5–5.0)
Alkaline Phosphatase: 84 U/L (ref 38–126)
Anion gap: 8 (ref 5–15)
BUN: 21 mg/dL (ref 8–23)
CO2: 28 mmol/L (ref 22–32)
Calcium: 9.3 mg/dL (ref 8.9–10.3)
Chloride: 99 mmol/L (ref 98–111)
Creatinine, Ser: 1.27 mg/dL — ABNORMAL HIGH (ref 0.61–1.24)
GFR, Estimated: >60 mL/min (ref >60)
Glucose, Bld: 124 mg/dL — ABNORMAL HIGH (ref 70–99)
Potassium: 4.2 mmol/L (ref 3.5–5.1)
Sodium: 135 mmol/L (ref 135–145)
Total Bilirubin: 0.7 mg/dL (ref 0.3–1.2)
Total Protein: 7.5 g/dL (ref 6.5–8.1)

## 2021-02-01 LAB — CBC WITH DIFFERENTIAL/PLATELET
Abs Immature Granulocytes: 0.03 10*3/uL (ref 0.00–0.07)
Basophils Absolute: 0 10*3/uL (ref 0.0–0.1)
Basophils Relative: 0 %
Eosinophils Absolute: 0.1 10*3/uL (ref 0.0–0.5)
Eosinophils Relative: 1 %
HCT: 34.7 % — ABNORMAL LOW (ref 39.0–52.0)
Hemoglobin: 12.1 g/dL — ABNORMAL LOW (ref 13.0–17.0)
Immature Granulocytes: 0 %
Lymphocytes Relative: 13 %
Lymphs Abs: 1.4 10*3/uL (ref 0.7–4.0)
MCH: 33.2 pg (ref 26.0–34.0)
MCHC: 34.9 g/dL (ref 30.0–36.0)
MCV: 95.1 fL (ref 80.0–100.0)
Monocytes Absolute: 1.1 10*3/uL — ABNORMAL HIGH (ref 0.1–1.0)
Monocytes Relative: 10 %
Neutro Abs: 8.1 10*3/uL — ABNORMAL HIGH (ref 1.7–7.7)
Neutrophils Relative %: 76 %
Platelets: 182 10*3/uL (ref 150–400)
RBC: 3.65 MIL/uL — ABNORMAL LOW (ref 4.22–5.81)
RDW: 13.4 % (ref 11.5–15.5)
WBC: 10.7 10*3/uL — ABNORMAL HIGH (ref 4.0–10.5)
nRBC: 0 % (ref 0.0–0.2)

## 2021-02-01 LAB — TROPONIN I (HIGH SENSITIVITY): Troponin I (High Sensitivity): 6 ng/L (ref <18)

## 2021-02-01 LAB — MAGNESIUM: Magnesium: 2.1 mg/dL (ref 1.7–2.4)

## 2021-02-01 MED ORDER — ACETAMINOPHEN 325 MG PO TABS: 650.0000 mg | ORAL_TABLET | ORAL | Status: DC | PRN

## 2021-02-01 MED ORDER — HEPARIN (PORCINE) 25000 UT/250ML-% IV SOLN
950.0000 [IU]/h | INTRAVENOUS | Status: DC
  Administered 2021-02-01: 22:00:00 800 [IU]/h via INTRAVENOUS
  Filled 2021-02-01: qty 250

## 2021-02-01 MED ORDER — SODIUM CHLORIDE 0.9% FLUSH: 3.0000 mL | INTRAVENOUS | Status: DC | PRN

## 2021-02-01 MED ORDER — RANOLAZINE ER 500 MG PO TB12
500.0000 mg | ORAL_TABLET | Freq: Two times a day (BID) | ORAL | Status: DC
  Administered 2021-02-01 – 2021-02-03 (×4): 500 mg via ORAL
  Filled 2021-02-01 (×4): qty 1

## 2021-02-01 MED ORDER — ATORVASTATIN CALCIUM 80 MG PO TABS
80.0000 mg | ORAL_TABLET | Freq: Every day | ORAL | Status: DC
  Administered 2021-02-01 – 2021-02-02 (×2): 80 mg via ORAL
  Filled 2021-02-01 (×2): qty 1

## 2021-02-01 MED ORDER — ASPIRIN 300 MG RE SUPP
300.0000 mg | RECTAL | Status: AC
  Filled 2021-02-01: qty 1

## 2021-02-01 MED ORDER — PRASUGREL HCL 10 MG PO TABS
10.0000 mg | ORAL_TABLET | Freq: Every day | ORAL | Status: DC
  Administered 2021-02-02 – 2021-02-03 (×2): 10 mg via ORAL
  Filled 2021-02-01 (×2): qty 1

## 2021-02-01 MED ORDER — GABAPENTIN 400 MG PO CAPS
1200.0000 mg | ORAL_CAPSULE | Freq: Two times a day (BID) | ORAL | Status: DC
  Administered 2021-02-01 – 2021-02-02 (×2): 1200 mg via ORAL
  Filled 2021-02-01 (×2): qty 3

## 2021-02-01 MED ORDER — SODIUM CHLORIDE 0.9 % IV SOLN: 250.0000 mL | INTRAVENOUS | Status: DC | PRN

## 2021-02-01 MED ORDER — OXYCODONE HCL 5 MG PO TABS: 5.0000 mg | ORAL_TABLET | ORAL | Status: DC | PRN

## 2021-02-01 MED ORDER — AMLODIPINE BESYLATE 10 MG PO TABS
10.0000 mg | ORAL_TABLET | Freq: Every day | ORAL | Status: DC
  Administered 2021-02-02 – 2021-02-03 (×2): 10 mg via ORAL
  Filled 2021-02-01 (×2): qty 1

## 2021-02-01 MED ORDER — ISOSORBIDE MONONITRATE ER 60 MG PO TB24
120.0000 mg | ORAL_TABLET | Freq: Every day | ORAL | Status: DC
  Administered 2021-02-02 – 2021-02-03 (×2): 120 mg via ORAL
  Filled 2021-02-01 (×2): qty 2

## 2021-02-01 MED ORDER — INSULIN ASPART 100 UNIT/ML IJ SOLN
0.0000 [IU] | Freq: Three times a day (TID) | INTRAMUSCULAR | Status: DC
  Administered 2021-02-03: 2 [IU] via SUBCUTANEOUS

## 2021-02-01 MED ORDER — TRAZODONE HCL 50 MG PO TABS: 50.0000 mg | ORAL_TABLET | Freq: Every evening | ORAL | Status: DC | PRN

## 2021-02-01 MED ORDER — ASPIRIN EC 81 MG PO TBEC
81.0000 mg | DELAYED_RELEASE_TABLET | Freq: Every day | ORAL | Status: DC
  Administered 2021-02-03: 81 mg via ORAL
  Filled 2021-02-01: qty 1

## 2021-02-01 MED ORDER — METOPROLOL SUCCINATE ER 100 MG PO TB24
200.0000 mg | ORAL_TABLET | Freq: Every day | ORAL | Status: DC
  Administered 2021-02-02 – 2021-02-03 (×2): 200 mg via ORAL
  Filled 2021-02-01 (×2): qty 2

## 2021-02-01 MED ORDER — NITROGLYCERIN 0.4 MG SL SUBL
0.4000 mg | SUBLINGUAL_TABLET | SUBLINGUAL | Status: DC | PRN
  Administered 2021-02-02 – 2021-02-03 (×3): 0.4 mg via SUBLINGUAL
  Filled 2021-02-01 (×2): qty 1

## 2021-02-01 MED ORDER — TIZANIDINE HCL 4 MG PO TABS
4.0000 mg | ORAL_TABLET | Freq: Three times a day (TID) | ORAL | Status: DC
  Administered 2021-02-02 (×2): 4 mg via ORAL
  Filled 2021-02-01 (×6): qty 1

## 2021-02-01 MED ORDER — PANTOPRAZOLE SODIUM 40 MG PO TBEC
40.0000 mg | DELAYED_RELEASE_TABLET | Freq: Every day | ORAL | Status: DC
  Administered 2021-02-02 – 2021-02-03 (×2): 40 mg via ORAL
  Filled 2021-02-01 (×3): qty 1

## 2021-02-01 MED ORDER — SODIUM CHLORIDE 0.9% FLUSH
3.0000 mL | Freq: Two times a day (BID) | INTRAVENOUS | Status: DC
  Administered 2021-02-01 – 2021-02-03 (×4): 3 mL via INTRAVENOUS

## 2021-02-01 MED ORDER — TIZANIDINE HCL 4 MG PO TABS
8.0000 mg | ORAL_TABLET | Freq: Every day | ORAL | Status: DC
  Administered 2021-02-01 – 2021-02-02 (×2): 8 mg via ORAL
  Filled 2021-02-01 (×3): qty 2

## 2021-02-01 MED ORDER — GLIPIZIDE ER 10 MG PO TB24
10.0000 mg | ORAL_TABLET | Freq: Every day | ORAL | Status: DC
  Administered 2021-02-02: 10 mg via ORAL
  Filled 2021-02-01 (×2): qty 1

## 2021-02-01 MED ORDER — ASPIRIN 81 MG PO CHEW
324.0000 mg | CHEWABLE_TABLET | ORAL | Status: AC
  Administered 2021-02-01: 22:00:00 324 mg via ORAL
  Filled 2021-02-01: qty 4

## 2021-02-01 MED ORDER — OXYCODONE-ACETAMINOPHEN 5-325 MG PO TABS: 1.0000 | ORAL_TABLET | ORAL | Status: DC | PRN

## 2021-02-01 MED ORDER — ASPIRIN 81 MG PO CHEW
81.0000 mg | CHEWABLE_TABLET | ORAL | Status: AC
  Administered 2021-02-02: 81 mg via ORAL
  Filled 2021-02-01: qty 1

## 2021-02-01 MED ORDER — TAMSULOSIN HCL 0.4 MG PO CAPS
0.4000 mg | ORAL_CAPSULE | Freq: Every day | ORAL | Status: DC
  Filled 2021-02-01 (×3): qty 1

## 2021-02-01 MED ORDER — SODIUM CHLORIDE 0.9% FLUSH: 3.0000 mL | Freq: Two times a day (BID) | INTRAVENOUS | Status: DC

## 2021-02-01 MED ORDER — INSULIN GLARGINE-YFGN 100 UNIT/ML ~~LOC~~ SOLN
32.0000 [IU] | Freq: Every day | SUBCUTANEOUS | Status: DC
  Administered 2021-02-02 – 2021-02-03 (×2): 32 [IU] via SUBCUTANEOUS
  Filled 2021-02-01 (×2): qty 0.32

## 2021-02-01 MED ORDER — SODIUM CHLORIDE 0.9 % IV SOLN: INTRAVENOUS | Status: DC

## 2021-02-01 MED ORDER — SPIRONOLACTONE 25 MG PO TABS
25.0000 mg | ORAL_TABLET | Freq: Every day | ORAL | Status: DC
  Administered 2021-02-02 – 2021-02-03 (×2): 25 mg via ORAL
  Filled 2021-02-01 (×2): qty 1

## 2021-02-01 MED ORDER — INSULIN GLARGINE (1 UNIT DIAL) 300 UNIT/ML ~~LOC~~ SOPN: 32.0000 [IU] | PEN_INJECTOR | Freq: Every day | SUBCUTANEOUS | Status: DC

## 2021-02-01 MED ORDER — ONDANSETRON HCL 4 MG/2ML IJ SOLN: 4.0000 mg | Freq: Four times a day (QID) | INTRAMUSCULAR | Status: DC | PRN

## 2021-02-01 MED ORDER — OXYCODONE-ACETAMINOPHEN 10-325 MG PO TABS: 1.0000 | ORAL_TABLET | ORAL | Status: DC | PRN

## 2021-02-01 MED ORDER — FUROSEMIDE 10 MG/ML IJ SOLN
40.0000 mg | Freq: Every day | INTRAMUSCULAR | Status: DC
  Administered 2021-02-01 – 2021-02-03 (×3): 40 mg via INTRAVENOUS
  Filled 2021-02-01 (×3): qty 4

## 2021-02-01 NOTE — Progress Notes (Addendum)
ANTICOAGULATION CONSULT NOTE - Initial Consult  Pharmacy Consult for IV Heparin Indication: chest pain/ACS  Allergies  Allergen Reactions   Metoclopramide Other (See Comments)    DYSKINESIAS   Penicillins Swelling and Rash    Did it involve swelling of the face/tongue/throat, SOB, or low BP? Yes Did it involve sudden or severe rash/hives, skin peeling, or any reaction on the inside of your mouth or nose? Unknown Did you need to seek medical attention at a hospital or doctor's office? Yes When did it last happen?More than 40 years ago    If all above answers are NO, may proceed with cephalosporin use.     Patient Measurements: Total Body Weight: 68.7 kg Height: 66 inches Heparin Dosing Weight: 68.7 kg  Vital Signs: Temp: 99.7 F (37.6 C) (01/25 1903) Temp Source: Oral (01/25 1903) BP: 152/82 (01/25 1903) Pulse Rate: 71 (01/25 1903)  Labs: Recent Labs    02/01/21 2053  HGB 12.1*  HCT 34.7*  PLT 182    CrCl cannot be calculated (Patient's most recent lab result is older than the maximum 21 days allowed.).   Medical History: Past Medical History:  Diagnosis Date   Anxiety state    Body mass index (bmi) 26.0-26.9, adult    CAD (coronary artery disease)    a. 2002 s/p CABG x 3 (LIMA->LAD, VG->OM1->OM2); b. s/p prior LAD and dRCA/RPDA stenting; c. 04/2019 PTCA & lithotripsy of ISR throughout LAD; d. 08/2020 PCI/scoring balloon PTCA throughout LAD 2/2 ISR; e. 09/2020 Cath: LM nl, LAD 40ost/p, 40p/d, 75d ISR (FFR 0.86), 50d, LCX small, OM1 90, OM2 25, RCA 20d ISR, RPDA 20 ISR, VG->OM1->OM2 ok, LIMA->LAD 124m-->Med rx.   Chronic heart failure with preserved ejection fraction (HFpEF) ([place])    a. 09/2020 Echo: Ef 60-65%, no rwma, nl RV size/fxn. Mod dil LA. triv MR. Mild AoV sclerosis w/o stenosis.   CKD (chronic kidney disease), stage II    DDD (degenerative disc disease), lumbar    DM (diabetes mellitus) ([place])    Gastritis    Headache    Hemorrhage    Hyperlipidemia     Hypertension    Kidney stones    Lumbago    Neck pain    Paroxysmal A-fib ([place])    a. CHA2DS2VASc = 4-->Eliquis; b. 06/2020 Zio: No afib. Up to 20 beats PSVT.   Pernicious anemia    Unstable angina [facility])     Assessment: 66 yr old with hx CAD (CABG X 3 in 2022, subsequent repeat stenting involving entire LAD) admitted as direct admit with unstable angina. Pharmacy is consulted to dose IV heparin for ACS (pt is on apixaban PTA for atrial fibrillation, with last dose yesterday [01/31/21] morning). Given recent apixaban exposure, will monitor anticoagulation using aPTT until aPTT and heparin levels correlate.  H/H 12.1/34.7, plt 182; Scr pending (BL appears to be ~1.0, per 9/22 labs in Epic)  Cardiac cath planned for tomorrow, 02/02/21  Goal of Therapy:  Heparin level 0.3-0.7 units/ml aPTT 66-102 sec Monitor platelets by anticoagulation protocol: Yes   Plan:  Start heparin infusion (no bolus) at 800 units/hr Check 6-hr heparin level, aPTT Monitor daily heparin level, aPTT, CBC Monitor for bleeding F/U after cardiac cath tomorrow, 1/26  Gillermina Hu, PharmD, BCPS, [facility] Clinical Pharmacist 02/01/2021,8:13 PM

## 2021-02-01 NOTE — Telephone Encounter (Signed)
Patient is calling back because he hasnt heard anything. Please advise

## 2021-02-01 NOTE — H&P (Addendum)
History & Physical    Patient ID: Terry Nelson MRN: [id number], DOB/AGE: 64-Jun-1957   Admit date: 02/01/2021   Primary Physician: Mila Palmer, PA-C Primary Cardiologist: Sinclair Grooms, MD  Patient Profile    66 year old male with a history of CAD status post prior CABG and multiple percutaneous interventions complicated by recurrent in-stent restenosis within the LAD, paroxysmal atrial fibrillation status post prior catheter ablation, hypertension, hyperlipidemia, type 2 diabetes mellitus, and stage II chronic kidney disease, who presents for admission from home in the setting of unstable angina.  Past Medical History    Past Medical History:  Diagnosis Date   Anxiety state    Body mass index (bmi) 26.0-26.9, adult    CAD (coronary artery disease)    a. 2002 s/p CABG x 3 (LIMA->LAD, VG->OM1->OM2); b. s/p prior LAD and dRCA/RPDA stenting; c. 04/2019 PTCA & lithotripsy of ISR throughout LAD; d. 08/2020 PCI/scoring balloon PTCA throughout LAD 2/2 ISR; e. 09/2020 Cath: LM nl, LAD 40ost/p, 40p/d, 75d ISR (FFR 0.86), 50d, LCX small, OM1 90, OM2 25, RCA 20d ISR, RPDA 20 ISR, VG->OM1->OM2 ok, LIMA->LAD 134m-->Med rx.   Chronic heart failure with preserved ejection fraction (HFpEF) ([place])    a. 09/2020 Echo: Ef 60-65%, no rwma, nl RV size/fxn. Mod dil LA. triv MR. Mild AoV sclerosis w/o stenosis.   CKD (chronic kidney disease), stage II    DDD (degenerative disc disease), lumbar    DM (diabetes mellitus) ([place])    Gastritis    Headache    Hemorrhage    Hyperlipidemia    Hypertension    Kidney stones    Lumbago    Neck pain    Paroxysmal A-fib ([place])    a. CHA2DS2VASc = 4-->Eliquis; b. 06/2020 Zio: No afib. Up to 20 beats PSVT.   Pernicious anemia    Unstable angina (HCC)     Past Surgical History:  Procedure Laterality Date   ABLATION  2017   CARDIAC CATHETERIZATION     CORONARY ARTERY BYPASS GRAFT     CORONARY BALLOON ANGIOPLASTY N/A 04/27/2019   Procedure: CORONARY BALLOON  ANGIOPLASTY;  Surgeon: Nelva Bush, MD;  Location: [place] [facility];  Service: Cardiovascular;  Laterality: N/A;   CORONARY BALLOON ANGIOPLASTY N/A 12/16/2019   Procedure: CORONARY BALLOON ANGIOPLASTY;  Surgeon: Belva Crome, MD;  Location: [place] [facility];  Service: Cardiovascular;  Laterality: N/A;   CORONARY BALLOON ANGIOPLASTY N/A 09/06/2020   Procedure: CORONARY BALLOON ANGIOPLASTY;  Surgeon: Belva Crome, MD;  Location: [place] [facility];  Service: Cardiovascular;  Laterality: N/A;   INTRAVASCULAR PRESSURE WIRE/FFR STUDY N/A 09/30/2020   Procedure: INTRAVASCULAR PRESSURE WIRE/FFR STUDY;  Surgeon: Early Osmond, MD;  Location: [place] [facility];  Service: Cardiovascular;  Laterality: N/A;   INTRAVASCULAR ULTRASOUND/IVUS N/A 04/27/2019   Procedure: Intravascular Ultrasound/IVUS;  Surgeon: Nelva Bush, MD;  Location: [place] [facility];  Service: Cardiovascular;  Laterality: N/A;   INTRAVASCULAR ULTRASOUND/IVUS N/A 12/16/2019   Procedure: Intravascular Ultrasound/IVUS;  Surgeon: Belva Crome, MD;  Location: [facility] [facility];  Service: Cardiovascular;  Laterality: N/A;   LEFT HEART CATH AND CORS/GRAFTS ANGIOGRAPHY N/A 04/09/2019   Procedure: LEFT HEART CATH AND CORS/GRAFTS ANGIOGRAPHY;  Surgeon: Belva Crome, MD;  Location: [place] [facility];  Service: Cardiovascular;  Laterality: N/A;   LEFT HEART CATH AND CORS/GRAFTS ANGIOGRAPHY N/A 12/16/2019   Procedure: LEFT HEART CATH AND CORS/GRAFTS ANGIOGRAPHY;  Surgeon: Belva Crome, MD;  Location: [place];  Service: Cardiovascular;  Laterality: N/A;   LEFT HEART CATH AND CORS/GRAFTS ANGIOGRAPHY N/A 09/06/2020   Procedure: LEFT HEART CATH AND CORS/GRAFTS ANGIOGRAPHY;  Surgeon: Belva Crome, MD;  Location: [place] [facility];  Service: Cardiovascular;  Laterality: N/A;   LEFT HEART CATH AND CORS/GRAFTS ANGIOGRAPHY N/A 09/30/2020   Procedure: LEFT HEART CATH AND CORS/GRAFTS ANGIOGRAPHY;  Surgeon: Early Osmond, MD;   Location: [facility];  Service: Cardiovascular;  Laterality: N/A;     Allergies  Allergies  Allergen Reactions   Metoclopramide Other (See Comments)    DYSKINESIAS   Penicillins Swelling and Rash    Did it involve swelling of the face/tongue/throat, SOB, or low BP? Yes Did it involve sudden or severe rash/hives, skin peeling, or any reaction on the inside of your mouth or nose? Unknown Did you need to seek medical attention at a hospital or doctor's office? Yes When did it last happen?More than 40 years ago    If all above answers are NO, may proceed with cephalosporin use.     History of Present Illness    66 year old male with the above past medical history including CAD, paroxysmal atrial fibrillation status post prior catheter ablation, hypertension, hyperlipidemia, type 2 diabetes mellitus, and stage II chronic kidney disease.  He previously underwent CABG x3 in 2002 and since then, has required multiple percutaneous interventions throughout the entirety of the LAD and also stenting of the distal RCA/RPDA.  In the setting of recurrent angina and LAD in-stent restenosis, he underwent PTCA and lithotripsy throughout the LAD in April 2021.  Unfortunately, he required repeat intervention with PCI and scoring balloon angioplasty throughout the LAD in August 2022.  His last catheterization was performed in September 2022, revealing relatively stable anatomy, w/ patent seq VG  OM1  OM2, atretic LIMA  LAD, and relatively stable LAD dzs, though he was noted to have recurrent ISR in the distal LAD w/ an FFR of 0.86.  Med rx was advised and ranexa 500 mg BID was added.  At office f/u in November, he was doing well, but over the past 6+ wks, he as noted progressive DOE, freq requiring 10-12 sl NTG per day.  He had COVID about 3-4 wks ago w/ severe sinus congestion, though this has resolved.  He did try taking ranexa 1000 bid, however this resulted in upper and lower extremity weakness, that  resolved after reducing the dose back to 500 bid.  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  In that setting, c/p has been very frequent.    Terry Nelson initially contacted our office on 1/16 w/ chest pain related to not having ranexa, and this was refilled.  Unfortunately, he called again 1/23 due to recurrent angina and dyspnea, and arrangements were made for direct admission.  He finally got a bed tonight.  He did have recurrent angina when walking from admitting upstairs, but is currently pain free.  Home Medications    Prior to Admission medications   Medication Sig Start Date End Date Taking? Authorizing Provider  amLODipine (NORVASC) 10 MG tablet Take 10 mg by mouth daily. 08/31/20   [provider]  apixaban (ELIQUIS) 5 MG TABS tablet Take 1 tablet (5 mg total) by mouth 2 (two) times daily. 07/08/20   Belva Crome, MD  atorvastatin (LIPITOR) 80 MG tablet Take 80 mg by mouth at bedtime.    [provider]  Blood Glucose Monitoring Suppl (  CONTOUR NEXT MONITOR) w/Device KIT daily. 11/02/19   [provider]  furosemide (LASIX) 40 MG tablet Take 40 mg by mouth daily.    [provider]  gabapentin (NEURONTIN) 300 MG capsule Take 1,200 mg by mouth 2 (two) times daily. 07/19/20   [provider]  glipiZIDE (GLUCOTROL XL) 10 MG 24 hr tablet Take 10 mg by mouth at bedtime.    [provider]  glucose blood (CONTOUR NEXT TEST) test strip by Does not apply route. 11/02/19   [provider]  Insulin Pen Needle (BD PEN NEEDLE NANO U/F) 32G X 4 MM MISC USE ONCE AS DIRECTED 08/09/19   [provider]  isosorbide mononitrate (IMDUR) 60 MG 24 hr tablet Take 120 mg by mouth daily.    [provider]  metoprolol (TOPROL XL) 200 MG 24 hr tablet Take 1 tablet (200 mg total) by mouth daily. 08/11/20   Chandrasekhar, Terisa Starr, MD  nitroGLYCERIN (NITROSTAT) 0.4 MG SL tablet Place 1 tablet (0.4 mg  total) under the tongue every 5 (five) minutes as needed for chest pain. 01/31/21   Belva Crome, MD  oxyCODONE-acetaminophen (PERCOCET) 10-325 MG tablet Take 1 tablet by mouth every 4 (four) hours as needed for pain.    [provider]  pantoprazole (PROTONIX) 40 MG tablet Take 40 mg by mouth daily.    [provider]  prasugrel (EFFIENT) 10 MG TABS tablet Take 10 mg by mouth daily.    [provider]  promethazine (PHENERGAN) 25 MG tablet Take 25 mg by mouth 2 (two) times daily as needed for nausea or vomiting.    [provider]  ranolazine (RANEXA) 500 MG 12 hr tablet Take 1 tablet (500 mg total) by mouth 2 (two) times daily. 01/25/21   Belva Crome, MD  spironolactone (ALDACTONE) 25 MG tablet Take 25 mg by mouth daily. 08/26/18   [provider]  tamsulosin (FLOMAX) 0.4 MG CAPS capsule Take 0.4 mg by mouth daily. 11/04/20   [provider]  tiZANidine (ZANAFLEX) 4 MG tablet Take 4-8 mg by mouth See admin instructions. Take 4 mg by mouth in the morning, afternoon and dinner and 8 mg bedtime    [provider]  TOUJEO SOLOSTAR 300 UNIT/ML Solostar Pen Inject 32 Units into the skin daily. 11/07/18   [provider]  traZODone (DESYREL) 50 MG tablet Take 50 mg by mouth at bedtime as needed for sleep. 08/26/20   [provider]    Family History    Family History  Problem Relation Age of Onset   High blood pressure Mother    Stroke Mother    Diabetes Father    Heart disease Other    He indicated that his mother is deceased. He indicated that his father is alive. He indicated that his maternal grandmother is deceased. He indicated that his maternal grandfather is deceased. He indicated that his paternal grandmother is deceased. He indicated that his paternal grandfather is deceased. He indicated that his other is alive.   Social History    Social History   Socioeconomic History   Marital status: Married     Spouse name: Not on file   Number of children: Not on file   Years of education: Not on file   Highest education level: Not on file  Occupational History   Not on file  Tobacco Use   Smoking status: Never   Smokeless tobacco: Former    Types: Chew   Tobacco  comments:    per pt chewed tobacco in high school   Vaping Use   Vaping Use: Never used  Substance and Sexual Activity   Alcohol use: Never   Drug use: Never   Sexual activity: Not on file    Comment: MARRIED  Other Topics Concern   Not on file  Social History Narrative   Not on file   Social Determinants of Health   Financial Resource Strain: Not on file  Food Insecurity: Not on file  Transportation Needs: Not on file  Physical Activity: Not on file  Stress: Not on file  Social Connections: Not on file  Intimate Partner Violence: Not on file     Review of Systems    Vitals:   02/01/21 1903  BP: (!) 152/82  Pulse: 71  Resp: 17  Temp: 99.7 F (37.6 C)  SpO2: 98%    General:  No chills, fever, night sweats or weight changes.  Cardiovascular:  +++ exertional chest pain, +++ dyspnea on exertion, no edema, orthopnea, +++ occas palpitations, no paroxysmal nocturnal dyspnea. Dermatological: No rash, lesions/masses Respiratory: No cough, +++ dyspnea Urologic: No hematuria, dysuria Abdominal:   No nausea, vomiting, diarrhea, bright red blood per rectum, melena, or hematemesis Neurologic:  No visual changes, wkns, changes in mental status. All other systems reviewed and are otherwise negative except as noted above.  Physical Exam    Blood pressure (!) 152/82, pulse 71, temperature 99.7 F (37.6 C), temperature source Oral, resp. rate 17, SpO2 98 %.  General: Pleasant, NAD Psych: Normal affect. Neuro: Alert and oriented X 3. Moves all extremities spontaneously. HEENT: Normal  Neck: Supple without bruits or JVD. Lungs:  Resp regular and unlabored, CTA. Heart: RRR no s3, s4, or murmurs. Abdomen: Soft,  non-tender, non-distended, BS + x 4.  Extremities: No clubbing, cyanosis or edema. DP/PT 2+, Radials 2+ and equal bilaterally.  Labs    All labs pending this evening   Radiology Studies    No results found.  ECG & Cardiac Imaging    pending - personally reviewed.  Assessment & Plan    1.  Unstable angina/CAD:  Pt w/ h/o CABG x 3 in 2002 and subsequent repeat stenting involving the entire LAD.  He's also required stenting of the dRCA/RPDA.  Since 04/2019, he's been cathed 4 times w/ PCI's in 04/2019 and 08/2020 secondary to ISR w/in the LAD.  Last cath in 09/2020 showed relatively stable anatomy w/ 2/3 patent grafts (LIMA known to be atretic).  The distal LAD was noted to have ISR w/ mildly abnl FFR @ 0.86  Med rx was advised and he was placed on ranexa.  Though this initially improved Ss, over the past 6+ wks, he's been having recurrent exertional angina and dyspnea w/ minimal activity.  After contacting our office on January 23, arrangements were made for direct admission.  He did have recurrent chest pain today walking up from admitting.  Currently chest pain-free.  Follow-up lab work and twelve-lead ECG.  N.p.o. after midnight for planned diagnostic catheterization.  The patient understands that risks include but are not limited to stroke (1 in 1000), death (1 in 1), kidney failure [usually temporary] (1 in 500), bleeding (1 in 200), allergic reaction [possibly serious] (1 in 200), and agrees to proceed.  His last dose of Eliquis was on the morning of January 24.  We will add aspirin and continue statin, prasugrel, ranolazine, beta-blocker, and long-acting nitrate therapy.  2.  PAF: Status post prior catheter  ablation.  Quiescent.  He is on chronic oral anticoagulation with last dose of Eliquis yesterday morning, as he was preparing for admission and catheterization.  Continue beta-blocker therapy.  3.  Essential HTN: Blood pressure currently elevated.  Resume home medications and  follow.  4.  HL: Continue statin therapy.  5.  Chronic HFpEF:  Has been having lower ext edema and doubling up on lasix @ home.  2+ edema on exam.  Follow-up labs-will likely require IV diuresis.  6.  CKD II:  f/u labs.  In the setting of recent increase in lower extremity swelling, KVO fluids prior to catheterization.  7.  DMII: Add sliding scale insulin.  Signed, Murray Hodgkins, Nelson 02/01/2021, 7:08 PM    Attending note:  Patient seen and examined.  Records reviewed and case discussed with Terry Nelson, I agree with his above findings.  Patient presents from home for elective admission arranged by Dr. Tamala Julian with recent unstable angina symptoms in the setting of known multivessel CAD status post CABG with multiple PCIs over time, most recently to the LAD.  He reports compliance with his medications including Ranexa, has had accelerating angina over the last 6 weeks.  Had COVID-19 in early January with symptoms presently resolved.  Most recent cardiac catheterization in September 2022 as summarized above.  He also reports worsening leg swelling over the last week, doubled his Lasix dose at home.  His last dose of Eliquis was yesterday morning.  On examination he is in no distress.  Afebrile, heart rate in the 70s and blood pressure 152/82.  Lungs are clear.  Cardiac exam reveals RRR with 1/6 systolic murmur, no gallop.  Distal pulses full.  2+ lower leg edema.  Lab work is pending at this time as well as ECG.  Last echocardiogram in September 2022 revealed LVEF 60 to 65% without regional wall motion abnormalities.  Terry Nelson presents with unstable angina.  Plan to initiate heparin, cycle cardiac enzymes and obtain baseline lab work particularly for follow-up of renal function in light of recent escalation in outpatient Lasix dosing.  He has been off Eliquis since morning dose yesterday.  Anticipate follow-up diagnostic cardiac catheterization and possible high risk PCI as discussed by Dr.  Tamala Julian in recent telephone notes.  This will be tentatively scheduled for tomorrow pending lab work.  Plan to continue Ranexa, Norvasc, Toprol-XL, Lipitor, Effient, and Imdur otherwise.  Hold off on Aldactone and decide on additional Lasix dosing depending on renal function.  Satira Sark, M.D., F.A.C.C.

## 2021-02-01 NOTE — Telephone Encounter (Signed)
Spoke with pt and advised his admission is still pending.  He denies any changes in symptoms, was just wondering where we were.  Advised they had several pending yesterday so it may be later today before they call him to admit.  Pt still prefers to wait for direct admit but promises to go to ED is anything changes.  Pt appreciative for call.

## 2021-02-02 ENCOUNTER — Inpatient Hospital Stay (HOSPITAL_COMMUNITY): Payer: BC Managed Care – PPO

## 2021-02-02 ENCOUNTER — Other Ambulatory Visit (HOSPITAL_COMMUNITY): Payer: Self-pay

## 2021-02-02 ENCOUNTER — Encounter (HOSPITAL_COMMUNITY)
Admission: AD | Disposition: A | Discharge status: Home / Self Care | Payer: Self-pay | Source: Home / Self Care | Attending: Interventional Cardiology

## 2021-02-02 DIAGNOSIS — D509 Iron deficiency anemia, unspecified: Secondary | ICD-10-CM

## 2021-02-02 DIAGNOSIS — I5031 Acute diastolic (congestive) heart failure: Secondary | ICD-10-CM

## 2021-02-02 DIAGNOSIS — I200 Unstable angina: Secondary | ICD-10-CM | POA: Diagnosis not present

## 2021-02-02 DIAGNOSIS — R0609 Other forms of dyspnea: Secondary | ICD-10-CM

## 2021-02-02 LAB — GLUCOSE, CAPILLARY
Glucose-Capillary: 163 mg/dL — ABNORMAL HIGH (ref 70–99)
Glucose-Capillary: 70 mg/dL (ref 70–99)
Glucose-Capillary: 110 mg/dL — ABNORMAL HIGH (ref 70–99)
Glucose-Capillary: 108 mg/dL — ABNORMAL HIGH (ref 70–99)
Glucose-Capillary: 178 mg/dL — ABNORMAL HIGH (ref 70–99)

## 2021-02-02 LAB — BASIC METABOLIC PANEL
Anion gap: 11 (ref 5–15)
BUN: 20 mg/dL (ref 8–23)
CO2: 26 mmol/L (ref 22–32)
Calcium: 9.3 mg/dL (ref 8.9–10.3)
Chloride: 100 mmol/L (ref 98–111)
Creatinine, Ser: 1.23 mg/dL (ref 0.61–1.24)
GFR, Estimated: >60 mL/min (ref >60)
Glucose, Bld: 115 mg/dL — ABNORMAL HIGH (ref 70–99)
Potassium: 4 mmol/L (ref 3.5–5.1)
Sodium: 137 mmol/L (ref 135–145)

## 2021-02-02 LAB — LIPID PANEL
Cholesterol: 106 mg/dL (ref 0–200)
HDL: 45 mg/dL (ref >40)
LDL Cholesterol: 47 mg/dL (ref 0–99)
Total CHOL/HDL Ratio: 2.4 RATIO
Triglycerides: 69 mg/dL (ref <150)
VLDL: 14 mg/dL (ref 0–40)

## 2021-02-02 LAB — CBC
HCT: 30.3 % — ABNORMAL LOW (ref 39.0–52.0)
Hemoglobin: 10.5 g/dL — ABNORMAL LOW (ref 13.0–17.0)
MCH: 33.3 pg (ref 26.0–34.0)
MCHC: 34.7 g/dL (ref 30.0–36.0)
MCV: 96.2 fL (ref 80.0–100.0)
Platelets: 148 10*3/uL — ABNORMAL LOW (ref 150–400)
RBC: 3.15 MIL/uL — ABNORMAL LOW (ref 4.22–5.81)
RDW: 13.6 % (ref 11.5–15.5)
WBC: 7.4 10*3/uL (ref 4.0–10.5)
nRBC: 0 % (ref 0.0–0.2)

## 2021-02-02 LAB — ECHOCARDIOGRAM COMPLETE
AR max vel: 2.47 cm2
AV Peak grad: 5 mmHg
Ao pk vel: 1.12 m/s
Area-P 1/2: 3.6 cm2
Height: 66 in
S' Lateral: 2.9 cm
Weight: 2336 oz

## 2021-02-02 LAB — IRON AND TIBC
Iron: 40 ug/dL — ABNORMAL LOW (ref 45–182)
Saturation Ratios: 9 % — ABNORMAL LOW (ref 17.9–39.5)
TIBC: 435 ug/dL (ref 250–450)
UIBC: 395 ug/dL

## 2021-02-02 LAB — HEMOGLOBIN A1C
Hgb A1c MFr Bld: 6.7 % — ABNORMAL HIGH (ref 4.8–5.6)
Mean Plasma Glucose: 146 mg/dL

## 2021-02-02 LAB — HEPARIN LEVEL (UNFRACTIONATED): Heparin Unfractionated: >1.1 IU/mL — ABNORMAL HIGH (ref 0.30–0.70)

## 2021-02-02 LAB — TROPONIN I (HIGH SENSITIVITY): Troponin I (High Sensitivity): 8 ng/L (ref <18)

## 2021-02-02 LAB — FERRITIN: Ferritin: 22 ng/mL — ABNORMAL LOW (ref 24–336)

## 2021-02-02 LAB — APTT: aPTT: 53 seconds — ABNORMAL HIGH (ref 24–36)

## 2021-02-02 SURGERY — LEFT HEART CATH AND CORS/GRAFTS ANGIOGRAPHY
Anesthesia: LOCAL

## 2021-02-02 MED ORDER — GABAPENTIN 300 MG PO CAPS
900.0000 mg | ORAL_CAPSULE | Freq: Two times a day (BID) | ORAL | Status: DC
  Administered 2021-02-02 – 2021-02-03 (×2): 900 mg via ORAL
  Filled 2021-02-02 (×2): qty 3

## 2021-02-02 MED ORDER — ENOXAPARIN SODIUM 40 MG/0.4ML IJ SOSY
40.0000 mg | PREFILLED_SYRINGE | INTRAMUSCULAR | Status: DC
  Administered 2021-02-02: 40 mg via SUBCUTANEOUS
  Filled 2021-02-02: qty 0.4

## 2021-02-02 MED ORDER — DAPAGLIFLOZIN PROPANEDIOL 10 MG PO TABS
10.0000 mg | ORAL_TABLET | Freq: Every day | ORAL | Status: DC
  Administered 2021-02-02 – 2021-02-03 (×2): 10 mg via ORAL
  Filled 2021-02-02 (×2): qty 1

## 2021-02-02 NOTE — Progress Notes (Signed)
Pt had an occurrence of chest pain at 0450 after standing up to get daily weight. 2 doses of SL Nitro given and pain subsided. Will continue to monitor.

## 2021-02-02 NOTE — Progress Notes (Signed)
Mobility Specialist Progress Note    02/02/21 1531  Mobility  Activity Contraindicated/medical hold   Upon arrival pt standing in room frustrated and monitor alerting to vtach. Pt agreeable but IV site began to bleed and RN notified. Did not ambulate with pt as he c/o wanting to leave the hospital when RN entered.   [facility] Mobility Specialist  M.S. 2C and 6E: [phone] M.S. 4E: [phone]

## 2021-02-02 NOTE — TOC Benefit Eligibility Note (Signed)
Patient Product/process development scientist completed.    The patient is currently admitted and upon discharge could be taking Jardiance 10 mg.  The current 30 day co-pay is, $581.96 due to a $5,500.00 deductible.   The patient is currently admitted and upon discharge could be taking Farxiga 10 mg.  The current 30 day co-pay is, $554.53 due to a $5,500.00 deductible.   The patient is insured through [employer] of [facility]     Roland Earl, CPhT Pharmacy Patient Advocate Specialist [facility] Pharm[facility] Patient Advocate Team Direct Number: [phone]  Fax: [phone]

## 2021-02-02 NOTE — Care Management (Signed)
°  Transition of Care [facility]) Screening Note   Patient Details  Name: Terry Nelson Date of Birth: 11-05-1955   Transition of Care [facility]) CM/SW Contact:    Bethena Roys, RN Phone Number: 02/02/2021, 9:58 AM    Transition of Care Department [facility]) has reviewed the patient and no TOC needs have been identified at this time. We will continue to monitor patient advancement through interdisciplinary progression rounds. If new patient transition needs arise, please place a TOC consult.

## 2021-02-02 NOTE — Progress Notes (Signed)
°   02/02/21 1330  Assess: MEWS Score  Temp 97.6 F (36.4 C)  BP 96/61  Pulse Rate (!) 49  ECG Heart Rate (!) 49  Resp 16  Level of Consciousness Alert  SpO2 99 %  O2 Device Room Air  Assess: if the MEWS score is Yellow or Red  Were vital signs taken at a resting state? Yes  Focused Assessment No change from prior assessment  Early Detection of Sepsis Score *See Row Information* Low  MEWS guidelines implemented *See Row Information* Yes  Treat  MEWS Interventions Escalated (See documentation below)  Pain Scale 0-10  Pain Score 0  Take Vital Signs  Increase Vital Sign Frequency  Yellow: Q 2hr X 2 then Q 4hr X 2, if remains yellow, continue Q 4hrs  Escalate  MEWS: Escalate Yellow: discuss with charge nurse/RN and consider discussing with provider and RRT  Notify: Charge Nurse/RN  Name of Charge Nurse/RN Notified Eduardo Osier, RN  Date Charge Nurse/RN Notified 02/02/21  Time Charge Nurse/RN Notified 1357  Notify: Provider  Provider Name/Title Laverda Page, RN  Date Provider Notified 02/02/21  Time Provider Notified 1357  Notification Type Page  Notification Reason Change in status;Other (Comment) (Asymptomatic bradycardia)  Provider response No new orders  Date of Provider Response 02/02/21  Time of Provider Response 1359  Notify: Rapid Response  Name of Rapid Response RN Notified  (N/A)  Document  Patient Outcome Other (Comment) (Asymptomatic Bradycardia while sleeping)  Progress note created (see row info) Yes

## 2021-02-02 NOTE — Progress Notes (Signed)
Echocardiogram 2D Echocardiogram has been performed.  Eduard Roux 02/02/2021, 8:51 AM

## 2021-02-02 NOTE — Progress Notes (Signed)
Pt states he tested positive for Covid 3-4 weeks ago at [facility]. Will DC the order to swab him for COVID today. Melissa Montane, RN from cath lab notified.

## 2021-02-02 NOTE — Progress Notes (Signed)
[place] for IV Heparin Indication: chest pain/ACS  Allergies  Allergen Reactions   Metoclopramide Other (See Comments)    DYSKINESIAS contractions of whole body   Penicillins Swelling and Rash    Did it involve swelling of the face/tongue/throat, SOB, or low BP? Yes Did it involve sudden or severe rash/hives, skin peeling, or any reaction on the inside of your mouth or nose? Unknown Did you need to seek medical attention at a hospital or doctor's office? Yes When did it last happen?More than 40 years ago    If all above answers are NO, may proceed with cephalosporin use.     Patient Measurements: Total Body Weight: 68.7 kg Height: 66 inches Heparin Dosing Weight: 68.7 kg  Vital Signs: Temp: 99.4 F (37.4 C) (01/26 0359) Temp Source: Oral (01/26 0359) BP: 91/61 (01/26 0359) Pulse Rate: 53 (01/26 0359)  Labs: Recent Labs    02/01/21 2053 02/01/21 2305 02/02/21 0358  HGB 12.1*  --  10.5*  HCT 34.7*  --  30.3*  PLT 182  --  148*  APTT  --   --  53*  HEPARINUNFRC  --   --  >1.10*  CREATININE 1.27*  --   --   TROPONINIHS 6 8  --      Estimated Creatinine Clearance: 53 mL/min (A) (by C-G formula based on SCr of 1.27 mg/dL (H)).   Medical History: Past Medical History:  Diagnosis Date   Anxiety state    Body mass index (bmi) 26.0-26.9, adult    CAD (coronary artery disease)    a. 2002 s/p CABG x 3 (LIMA->LAD, VG->OM1->OM2); b. s/p prior LAD and dRCA/RPDA stenting; c. 04/2019 PTCA & lithotripsy of ISR throughout LAD; d. 08/2020 PCI/scoring balloon PTCA throughout LAD 2/2 ISR; e. 09/2020 Cath: LM nl, LAD 40ost/p, 40p/d, 75d ISR (FFR 0.86), 50d, LCX small, OM1 90, OM2 25, RCA 20d ISR, RPDA 20 ISR, VG->OM1->OM2 ok, LIMA->LAD 19m-->Med rx.   Chronic heart failure with preserved ejection fraction (HFpEF) ([place])    a. 09/2020 Echo: Ef 60-65%, no rwma, nl RV size/fxn. Mod dil LA. triv MR. Mild AoV sclerosis w/o stenosis.   CKD (chronic  kidney disease), stage II    DDD (degenerative disc disease), lumbar    DM (diabetes mellitus) ([place])    Gastritis    Headache    Hemorrhage    Hyperlipidemia    Hypertension    Kidney stones    Lumbago    Neck pain    Paroxysmal A-fib ([place])    a. CHA2DS2VASc = 4-->Eliquis; b. 06/2020 Zio: No afib. Up to 20 beats PSVT.   Pernicious anemia    Unstable angina [facility])     Assessment: 66 yr old with hx CAD (CABG X 3 in 2022, subsequent repeat stenting involving entire LAD) admitted as direct admit with unstable angina. Pharmacy is consulted to dose IV heparin for ACS (pt is on apixaban PTA for atrial fibrillation, with last dose yesterday [01/31/21] morning). Given recent apixaban exposure, will monitor anticoagulation using aPTT until aPTT and heparin levels correlate.  H/H 12.1/34.7, plt 182; Scr pending (BL appears to be ~1.0, per 9/22 labs in Epic)  Cardiac cath planned for tomorrow, 02/02/21  1/26 AM update:  aPTT low  Goal of Therapy:  Heparin level 0.3-0.7 units/ml aPTT 66-102 sec Monitor platelets by anticoagulation protocol: Yes   Plan:  Inc heparin to 950 units/hr 1400 aPTT and heparin level  Narda Bonds, PharmD, BCPS Clinical Pharmacist  Phone: [phone]

## 2021-02-02 NOTE — Progress Notes (Addendum)
The patient has been seen in conjunction with Harlan Stains, NP. All aspects of care have been considered and discussed. The patient has been personally interviewed, examined, and all clinical data has been reviewed.  Complicated clinical conundrum.  Patient has had chest pain off and on for greater than 20 years.  This is led to procedures including multiple stents in LAD ("full metal jacket") with chronic and recurrent restenosis.  He likely has some microvascular dysfunction.  Despite increasing angina, cardiac markers are negative, and EKG is unchanged.  No evidence of ischemia. On clinical exam he is volume overloaded with peripheral edema, dyspnea on exertion, worse over the last several days. Echo reveals severe diastolic dysfunction, grade 3. Newly developing anemia with hemoglobin now 10.  He has not had atrial fibrillation in several years and is status post ablation.  We will plan to discontinue apixaban and continue Effient.  Resume DOAC if recurrent A. fib.  Needs outpatient work-up of anemia.  We will get iron studies. Continue anti-ischemic therapy, add SGLT2 therapy, IV diuresis, and probable discharge without repeat cath depending upon clinical course. For the future, coronary angiography needs to be reserved for angina with phasic ST-T wave changes and are elevated cardiac markers.    Progress Note  Patient Name: Terry Nelson Date of Encounter: 02/02/2021  [facility] Cardiologist: Sinclair Grooms, MD   Subjective   No chest pain this morning, does complain of orthopnea   Inpatient Medications    Scheduled Meds:  amLODipine  10 mg Oral Daily   aspirin EC  81 mg Oral Daily   atorvastatin  80 mg Oral QHS   furosemide  40 mg Intravenous Daily   gabapentin  1,200 mg Oral BID   glipiZIDE  10 mg Oral QHS   insulin aspart  0-15 Units Subcutaneous TID WC   insulin glargine-yfgn  32 Units Subcutaneous Daily   isosorbide mononitrate  120 mg Oral Daily    metoprolol  200 mg Oral Daily   pantoprazole  40 mg Oral Daily   prasugrel  10 mg Oral Daily   ranolazine  500 mg Oral BID   sodium chloride flush  3 mL Intravenous Q12H   spironolactone  25 mg Oral Daily   tamsulosin  0.4 mg Oral Daily   tiZANidine  4 mg Oral TID   tiZANidine  8 mg Oral QHS   Continuous Infusions:  sodium chloride     sodium chloride     sodium chloride 10 mL/hr at 02/02/21 0625   heparin 950 Units/hr (02/02/21 0644)   PRN Meds: sodium chloride, sodium chloride, acetaminophen, nitroGLYCERIN, ondansetron (ZOFRAN) IV, oxyCODONE-acetaminophen **AND** oxyCODONE, sodium chloride flush, sodium chloride flush, traZODone   Vital Signs    Vitals:   02/01/21 2130 02/02/21 0359 02/02/21 0700 02/02/21 0742  BP: 127/69 91/61 112/73   Pulse: 71 (!) 53 (!) 59 60  Resp: 15 16 14 12   Temp: 99.4 F (37.4 C) 99.4 F (37.4 C) 97.7 F (36.5 C)   TempSrc: Oral Oral Oral   SpO2: 98% 96%  100%  Weight:  66.2 kg    Height:        Intake/Output Summary (Last 24 hours) at 02/02/2021 0940 Last data filed at 02/02/2021 0900 Gross per 24 hour  Intake 39.58 ml  Output 1675 ml  Net -1635.42 ml   Last 3 Weights 02/02/2021 02/01/2021 11/23/2020  Weight (lbs) 146 lb 151 lb 6.4 oz 150 lb 11.2 oz  Weight (kg)  66.225 kg 68.675 kg 68.357 kg      Telemetry    SB - Personally Reviewed  ECG    SB, 54bpm nonspecific changes - Personally Reviewed  Physical Exam   GEN: No acute distress.   Neck: No JVD Cardiac: RRR, no murmurs, rubs, or gallops.  Respiratory: Clear to auscultation bilaterally. GI: Soft, nontender, non-distended  MS: + LE edema; No deformity. Neuro:  Nonfocal  Psych: Normal affect   Labs    High Sensitivity Troponin:   Recent Labs  Lab 02/01/21 2053 02/01/21 2305  TROPONINIHS 6 8     Chemistry Recent Labs  Lab 02/01/21 2053 02/02/21 0359  NA 135 137  K 4.2 4.0  CL 99 100  CO2 28 26  GLUCOSE 124* 115*  BUN 21 20  CREATININE 1.27* 1.23  CALCIUM  9.3 9.3  MG 2.1  --   PROT 7.5  --   ALBUMIN 4.3  --   AST 23  --   ALT 14  --   ALKPHOS 84  --   BILITOT 0.7  --   GFRNONAA >60 >60  ANIONGAP 8 11    Lipids  Recent Labs  Lab 02/02/21 0358  CHOL 106  TRIG 69  HDL 45  LDLCALC 47  CHOLHDL 2.4    Hematology Recent Labs  Lab 02/01/21 2053 02/02/21 0358  WBC 10.7* 7.4  RBC 3.65* 3.15*  HGB 12.1* 10.5*  HCT 34.7* 30.3*  MCV 95.1 96.2  MCH 33.2 33.3  MCHC 34.9 34.7  RDW 13.4 13.6  PLT 182 148*   Thyroid No results for input(s): TSH, FREET4 in the last 168 hours.  BNPNo results for input(s): BNP, PROBNP in the last 168 hours.  DDimer No results for input(s): DDIMER in the last 168 hours.   Radiology    ECHOCARDIOGRAM COMPLETE  Result Date: 02/02/2021    ECHOCARDIOGRAM REPORT   Patient Name:   Terry Nelson Date of Exam: 02/02/2021 Medical Rec #:  [id number]     Height:       66.0 in Accession #:    [id number]    Weight:       146.0 lb Date of Birth:  [birth year]     BSA:          1.749 m Patient Age:    66 years      BP:           112/73 mmHg Patient Gender: M             HR:           63 bpm. Exam Location:  Inpatient Procedure: 2D Echo Indications:    Dyspnea  History:        Patient has prior history of Echocardiogram examinations, most                 recent 09/30/2020. CAD; Risk Factors:Hypertension.  Sonographer:    Jefferey Pica Referring Phys: [id number] CHRISTOPHER RONALD [place]  1. Left ventricular ejection fraction, by estimation, is 60 to 65%. The left ventricle has normal function. The left ventricle has no regional wall motion abnormalities. There is mild left ventricular hypertrophy of the basal-septal segment. Left ventricular diastolic parameters are consistent with Grade III diastolic dysfunction (restrictive). Elevated left ventricular end-diastolic pressure.  2. Right ventricular systolic function is normal. The right ventricular size is normal. Tricuspid regurgitation signal is inadequate for assessing PA  pressure.  3. Left atrial size was severely dilated.  4. The  mitral valve is degenerative. Mild mitral valve regurgitation. No evidence of mitral stenosis.  5. The aortic valve is tricuspid. Aortic valve regurgitation is not visualized. Aortic valve sclerosis is present, with no evidence of aortic valve stenosis. Aortic valve Vmax measures 1.12 m/s.  6. The inferior vena cava is dilated in size with <50% respiratory variability, suggesting right atrial pressure of 15 mmHg. FINDINGS  Left Ventricle: Left ventricular ejection fraction, by estimation, is 60 to 65%. The left ventricle has normal function. The left ventricle has no regional wall motion abnormalities. The left ventricular internal cavity size was normal in size. There is  mild left ventricular hypertrophy of the basal-septal segment. Left ventricular diastolic parameters are consistent with Grade III diastolic dysfunction (restrictive). Elevated left ventricular end-diastolic pressure. Right Ventricle: The right ventricular size is normal. No increase in right ventricular wall thickness. Right ventricular systolic function is normal. Tricuspid regurgitation signal is inadequate for assessing PA pressure. Left Atrium: Left atrial size was severely dilated. Right Atrium: Right atrial size was normal in size. Pericardium: There is no evidence of pericardial effusion. Mitral Valve: The mitral valve is degenerative in appearance. There is mild thickening of the mitral valve leaflet(s). There is mild calcification of the mitral valve leaflet(s). Mild mitral valve regurgitation. No evidence of mitral valve stenosis. Tricuspid Valve: The tricuspid valve is normal in structure. Tricuspid valve regurgitation is trivial. No evidence of tricuspid stenosis. Aortic Valve: The aortic valve is tricuspid. Aortic valve regurgitation is not visualized. Aortic valve sclerosis is present, with no evidence of aortic valve stenosis. Aortic valve peak gradient measures 5.0 mmHg.  Pulmonic Valve: The pulmonic valve was normal in structure. Pulmonic valve regurgitation is not visualized. No evidence of pulmonic stenosis. Aorta: The aortic root is normal in size and structure. Venous: The inferior vena cava is dilated in size with less than 50% respiratory variability, suggesting right atrial pressure of 15 mmHg. IAS/Shunts: No atrial level shunt detected by color flow Doppler.  LEFT VENTRICLE PLAX 2D LVIDd:         4.50 cm   Diastology LVIDs:         2.90 cm   LV e' medial:    4.33 cm/s LV PW:         1.10 cm   LV E/e' medial:  21.4 LV IVS:        1.10 cm   LV e' lateral:   6.98 cm/s LVOT diam:     2.00 cm   LV E/e' lateral: 13.3 LV SV:         68 LV SV Index:   39 LVOT Area:     3.14 cm  RIGHT VENTRICLE            IVC RV Basal diam:  2.70 cm    IVC diam: 2.40 cm RV S prime:     8.72 cm/s TAPSE (M-mode): 1.6 cm LEFT ATRIUM             Index        RIGHT ATRIUM           Index LA diam:        5.00 cm 2.86 cm/m   RA Area:     15.30 cm LA Vol (A2C):   90.4 ml 51.67 ml/m  RA Volume:   38.70 ml  22.12 ml/m LA Vol (A4C):   84.5 ml 48.30 ml/m LA Biplane Vol: 88.2 ml 50.42 ml/m  AORTIC VALVE  PULMONIC VALVE AV Area (Vmax): 2.47 cm     PV Vmax:       0.75 m/s AV Vmax:        111.50 cm/s  PV Peak grad:  2.3 mmHg AV Peak Grad:   5.0 mmHg LVOT Vmax:      87.50 cm/s LVOT Vmean:     55.500 cm/s LVOT VTI:       0.216 m  AORTA Ao Root diam: 3.50 cm Ao Asc diam:  3.30 cm MITRAL VALVE MV Area (PHT): 3.60 cm    SHUNTS MV Decel Time: 211 msec    Systemic VTI:  0.22 m MV E velocity: 92.50 cm/s  Systemic Diam: 2.00 cm MV A velocity: 29.10 cm/s MV E/A ratio:  3.18 Fransico Him MD Electronically signed by Fransico Him MD Signature Date/Time: 02/02/2021/9:17:55 AM    Final     Cardiac Studies   Echo: 02/02/21  IMPRESSIONS     1. Left ventricular ejection fraction, by estimation, is 60 to 65%. The  left ventricle has normal function. The left ventricle has no regional  wall motion  abnormalities. There is mild left ventricular hypertrophy of  the basal-septal segment. Left  ventricular diastolic parameters are consistent with Grade III diastolic  dysfunction (restrictive). Elevated left ventricular end-diastolic  pressure.   2. Right ventricular systolic function is normal. The right ventricular  size is normal. Tricuspid regurgitation signal is inadequate for assessing  PA pressure.   3. Left atrial size was severely dilated.   4. The mitral valve is degenerative. Mild mitral valve regurgitation. No  evidence of mitral stenosis.   5. The aortic valve is tricuspid. Aortic valve regurgitation is not  visualized. Aortic valve sclerosis is present, with no evidence of aortic  valve stenosis. Aortic valve Vmax measures 1.12 m/s.   6. The inferior vena cava is dilated in size with <50% respiratory  variability, suggesting right atrial pressure of 15 mmHg.   FINDINGS   Left Ventricle: Left ventricular ejection fraction, by estimation, is 60  to 65%. The left ventricle has normal function. The left ventricle has no  regional wall motion abnormalities. The left ventricular internal cavity  size was normal in size. There is   mild left ventricular hypertrophy of the basal-septal segment. Left  ventricular diastolic parameters are consistent with Grade III diastolic  dysfunction (restrictive). Elevated left ventricular end-diastolic  pressure.   Right Ventricle: The right ventricular size is normal. No increase in  right ventricular wall thickness. Right ventricular systolic function is  normal. Tricuspid regurgitation signal is inadequate for assessing PA  pressure.   Left Atrium: Left atrial size was severely dilated.   Right Atrium: Right atrial size was normal in size.   Pericardium: There is no evidence of pericardial effusion.   Mitral Valve: The mitral valve is degenerative in appearance. There is  mild thickening of the mitral valve leaflet(s). There is  mild  calcification of the mitral valve leaflet(s). Mild mitral valve  regurgitation. No evidence of mitral valve stenosis.   Tricuspid Valve: The tricuspid valve is normal in structure. Tricuspid  valve regurgitation is trivial. No evidence of tricuspid stenosis.   Aortic Valve: The aortic valve is tricuspid. Aortic valve regurgitation is  not visualized. Aortic valve sclerosis is present, with no evidence of  aortic valve stenosis. Aortic valve peak gradient measures 5.0 mmHg.   Pulmonic Valve: The pulmonic valve was normal in structure. Pulmonic valve  regurgitation is not visualized. No evidence of pulmonic stenosis.  Aorta: The aortic root is normal in size and structure.   Venous: The inferior vena cava is dilated in size with less than 50%  respiratory variability, suggesting right atrial pressure of 15 mmHg.   IAS/Shunts: No atrial level shunt detected by color flow Doppler.       Patient Profile     66 y.o. male with a history of CAD status post prior CABG and multiple percutaneous interventions complicated by recurrent in-stent restenosis within the LAD, paroxysmal atrial fibrillation status post prior catheter ablation, hypertension, hyperlipidemia, type 2 diabetes mellitus, and stage II chronic kidney disease, who presented for admission from home in the setting of unstable angina.  Assessment & Plan    Unstable angina/CAD:  Pt w/ h/o CABG x 3 in 2002 and subsequent repeat stenting involving the entire LAD.  He's also required stenting of the dRCA/RPDA.  Since 04/2019, he's been cathed 4 times w/ PCI's in 04/2019 and 08/2020 secondary to ISR w/in the LAD.  Last cath in 09/2020 showed relatively stable anatomy w/ 2/3 patent grafts (LIMA known to be atretic). The distal LAD was noted to have ISR w/ mildly abnl FFR @ 0.86  Med rx was advised and he was placed on ranexa.   -- Though this initially improved Ss, over the past 6+ wks, he's been having recurrent exertional angina and  dyspnea w/ minimal activity.  After contacting our office on January 23, arrangements were made for direct admission.   -- continue asa, statin, prasugrel, ranolazine, beta-blocker, and long-acting nitrate therapy -- initially planned for cardiac cath today, but with neg troponin/after discussion with MD will defer and treat HF symptoms    PAF: S/p ablation.  Quiescent.  He is on chronic oral anticoagulation with last dose of Eliquis 1/24 am. Has not had any documented episodes of PAF for quite some time. Will hold IV heparin for now after discussion with MD -- Continue beta-blocker therapy   Essential HTN: Blood pressure stable -- continue amlodipine 10mg  daily, Toprol XL 200mg  daily. --If BP decreases with diuresis, amlodipine can be decreased in intensity or discontinued.   HL: Continue statin therapy.   Chronic HFpEF:  Has been having lower ext edema and doubling up on lasix @ home.   -- Echo with g3DD which is new from prior echo -- will give IV lasix 40mg , follow response  -- start SGLT2   CKD II: 1.27>>1.23 -- follow with diuresis   DMII: SSI -- add SGLT2  Anemia: Hgb baseline around 12, down to 10.5 on admission. No reports of dark stools PTA -- follow CBC  For questions or updates, please contact [place] Please consult [email or web address] for contact info under        Signed, Reino Bellis, NP  02/02/2021, 9:40 AM

## 2021-02-03 ENCOUNTER — Other Ambulatory Visit (HOSPITAL_COMMUNITY): Payer: Self-pay

## 2021-02-03 DIAGNOSIS — I5031 Acute diastolic (congestive) heart failure: Secondary | ICD-10-CM | POA: Diagnosis not present

## 2021-02-03 DIAGNOSIS — D509 Iron deficiency anemia, unspecified: Secondary | ICD-10-CM | POA: Diagnosis not present

## 2021-02-03 DIAGNOSIS — I200 Unstable angina: Secondary | ICD-10-CM | POA: Diagnosis not present

## 2021-02-03 LAB — GLUCOSE, CAPILLARY
Glucose-Capillary: 118 mg/dL — ABNORMAL HIGH (ref 70–99)
Glucose-Capillary: 46 mg/dL — ABNORMAL LOW (ref 70–99)
Glucose-Capillary: 124 mg/dL — ABNORMAL HIGH (ref 70–99)

## 2021-02-03 LAB — CBC
HCT: 30.2 % — ABNORMAL LOW (ref 39.0–52.0)
Hemoglobin: 10.1 g/dL — ABNORMAL LOW (ref 13.0–17.0)
MCH: 32.2 pg (ref 26.0–34.0)
MCHC: 33.4 g/dL (ref 30.0–36.0)
MCV: 96.2 fL (ref 80.0–100.0)
Platelets: 168 10*3/uL (ref 150–400)
RBC: 3.14 MIL/uL — ABNORMAL LOW (ref 4.22–5.81)
RDW: 13.6 % (ref 11.5–15.5)
WBC: 6.7 10*3/uL (ref 4.0–10.5)
nRBC: 0 % (ref 0.0–0.2)

## 2021-02-03 LAB — BASIC METABOLIC PANEL
Anion gap: 8 (ref 5–15)
BUN: 19 mg/dL (ref 8–23)
CO2: 30 mmol/L (ref 22–32)
Calcium: 9.2 mg/dL (ref 8.9–10.3)
Chloride: 98 mmol/L (ref 98–111)
Creatinine, Ser: 1.48 mg/dL — ABNORMAL HIGH (ref 0.61–1.24)
GFR, Estimated: 53 mL/min — ABNORMAL LOW (ref >60)
Glucose, Bld: 91 mg/dL (ref 70–99)
Potassium: 3.6 mmol/L (ref 3.5–5.1)
Sodium: 136 mmol/L (ref 135–145)

## 2021-02-03 LAB — HEMOGLOBIN A1C
Hgb A1c MFr Bld: 6.3 % — ABNORMAL HIGH (ref 4.8–5.6)
Mean Plasma Glucose: 134.11 mg/dL

## 2021-02-03 MED ORDER — DAPAGLIFLOZIN PROPANEDIOL 10 MG PO TABS
10.0000 mg | ORAL_TABLET | Freq: Every day | ORAL | 1 refills | Status: DC
  Filled 2021-02-03: qty 30, 30d supply, fill #0

## 2021-02-03 MED ORDER — ASPIRIN 81 MG PO TBEC
81.0000 mg | DELAYED_RELEASE_TABLET | Freq: Every day | ORAL | 11 refills | Status: DC
  Filled 2021-02-03: qty 30, 30d supply, fill #0

## 2021-02-03 MED ORDER — DEXTROSE 50 % IV SOLN
25.0000 g | INTRAVENOUS | Status: AC
  Administered 2021-02-03: 25 g via INTRAVENOUS

## 2021-02-03 MED ORDER — DEXTROSE 50 % IV SOLN
INTRAVENOUS | Status: AC
  Filled 2021-02-03: qty 50

## 2021-02-03 NOTE — Progress Notes (Addendum)
Pt was hard to arouse, vital signs were checked, 12-lead EKG obtained, & Narcisse, MD made aware. CBG resulted at 46, standing hypoglycemic orders were placed and implemented (see MAR). CBG was rechecked and resulted at 118. Pt also complained of 10/10 chest pain, SL nitro administered with relief. Will continue to monitor.   Bari Edward, RN

## 2021-02-03 NOTE — Progress Notes (Signed)
Progress Note  Patient Name: Terry Nelson Date of Encounter: 02/03/2021  [facility] Cardiologist: Sinclair Grooms, MD   Subjective   After evaluating the patient, we felt he had diastolic heart failure.  There was evidence of lower extremity edema.  He was having shortness of breath on exertion.  He also felt he was having more angina.  In considering his history of ischemic heart disease, despite multiple cardiovascular interventions including CABG and multiple percutaneous interventions, he continues to have angina despite ambiguous anatomy severe enough to cause ischemia.  Therefore a legitimate consideration is combined epicardial and microvascular disease/dysfunction.  This concept was discussed with the patient.  Also further discussed with him that we will not be repeating coronary angiography unless he has EKG changes, enzyme release, or some other clinical manifestation of large territory ischemia.  His hemoglobin is now 10.  This is relatively acute over the past 12 months.  Likely inducing GI bleeding with Effient and Eliquis.  He is on Eliquis because of a prior history of atrial fibrillation.  He is status post ablation and has not had A. fib in greater than 3 years.  Discussed the benefits of SGLT2 therapy and this patient with significant diastolic heart failure (grade 3 diastolic dysfunction) and the ability to stabilize kidney function.  Plan today is discharge after ambulating.  Note he did have hypoglycemia last night but did not eat supper but did receive glipizide and insulin.  Inpatient Medications    Scheduled Meds:  amLODipine  10 mg Oral Daily   aspirin EC  81 mg Oral Daily   atorvastatin  80 mg Oral QHS   dapagliflozin propanediol  10 mg Oral Daily   enoxaparin (LOVENOX) injection  40 mg Subcutaneous Q24H   furosemide  40 mg Intravenous Daily   gabapentin  900 mg Oral BID   insulin aspart  0-15 Units Subcutaneous TID WC   insulin glargine-yfgn  32  Units Subcutaneous Daily   isosorbide mononitrate  120 mg Oral Daily   metoprolol  200 mg Oral Daily   pantoprazole  40 mg Oral Daily   prasugrel  10 mg Oral Daily   ranolazine  500 mg Oral BID   sodium chloride flush  3 mL Intravenous Q12H   spironolactone  25 mg Oral Daily   tamsulosin  0.4 mg Oral Daily   tiZANidine  4 mg Oral TID   tiZANidine  8 mg Oral QHS   Continuous Infusions:  sodium chloride     sodium chloride     PRN Meds: sodium chloride, sodium chloride, acetaminophen, nitroGLYCERIN, ondansetron (ZOFRAN) IV, oxyCODONE-acetaminophen **AND** oxyCODONE, sodium chloride flush, sodium chloride flush, traZODone   Vital Signs    Vitals:   02/03/21 0015 02/03/21 0429 02/03/21 0457 02/03/21 0735  BP: (!) 88/56 137/74 102/63 111/67  Pulse:  70 63 66  Resp: 12 19 12 15   Temp: 98.6 F (37 C)  98.3 F (36.8 C) 98.3 F (36.8 C)  TempSrc: Oral  Oral Oral  SpO2:  100% 94% 100%  Weight:      Height:        Intake/Output Summary (Last 24 hours) at 02/03/2021 0826 Last data filed at 02/03/2021 0654 Gross per 24 hour  Intake --  Output 1425 ml  Net -1425 ml   Last 3 Weights 02/02/2021 02/01/2021 11/23/2020  Weight (lbs) 146 lb 151 lb 6.4 oz 150 lb 11.2 oz  Weight (kg) 66.225 kg 68.675 kg 68.357 kg  Telemetry    Sinus rhythm and sinus bradycardia- Personally Reviewed  ECG    EKG this early morning with prolonged chest pain did not reveal any ischemic changes.- Personally Reviewed  Physical Exam  Slender GEN: No acute distress.   Neck: No JVD Cardiac: RRR, no murmurs, rubs, or gallops.  Respiratory: Clear to auscultation bilaterally. GI: Soft, nontender, non-distended  MS: No edema; No deformity. Neuro:  Nonfocal  Psych: Normal affect   Labs    High Sensitivity Troponin:   Recent Labs  Lab 02/01/21 2053 02/01/21 2305  TROPONINIHS 6 8     Chemistry Recent Labs  Lab 02/01/21 2053 02/02/21 0359 02/03/21 0143  NA 135 137 136  K 4.2 4.0 3.6  CL  99 100 98  CO2 28 26 30   GLUCOSE 124* 115* 91  BUN 21 20 19   CREATININE 1.27* 1.23 1.48*  CALCIUM 9.3 9.3 9.2  MG 2.1  --   --   PROT 7.5  --   --   ALBUMIN 4.3  --   --   AST 23  --   --   ALT 14  --   --   ALKPHOS 84  --   --   BILITOT 0.7  --   --   GFRNONAA >60 >60 53*  ANIONGAP 8 11 8     Lipids  Recent Labs  Lab 02/02/21 0358  CHOL 106  TRIG 69  HDL 45  LDLCALC 47  CHOLHDL 2.4    Hematology Recent Labs  Lab 02/01/21 2053 02/02/21 0358 02/03/21 0143  WBC 10.7* 7.4 6.7  RBC 3.65* 3.15* 3.14*  HGB 12.1* 10.5* 10.1*  HCT 34.7* 30.3* 30.2*  MCV 95.1 96.2 96.2  MCH 33.2 33.3 32.2  MCHC 34.9 34.7 33.4  RDW 13.4 13.6 13.6  PLT 182 148* 168   Thyroid No results for input(s): TSH, FREET4 in the last 168 hours.  BNPNo results for input(s): BNP, PROBNP in the last 168 hours.  DDimer No results for input(s): DDIMER in the last 168 hours.   Radiology    ECHOCARDIOGRAM COMPLETE  Result Date: 02/02/2021    ECHOCARDIOGRAM REPORT   Patient Name:   Terry Nelson Date of Exam: 02/02/2021 Medical Rec #:  [id number]     Height:       66.0 in Accession #:    [id number]    Weight:       146.0 lb Date of Birth:  Jul 14, 1955     BSA:          1.749 m Patient Age:    89 years      BP:           112/73 mmHg Patient Gender: M             HR:           63 bpm. Exam Location:  Inpatient Procedure: 2D Echo Indications:    Dyspnea  History:        Patient has prior history of Echocardiogram examinations, most                 recent 09/30/2020. CAD; Risk Factors:Hypertension.  Sonographer:    Jefferey Pica Referring Phys: [id number] CHRISTOPHER RONALD [place]  1. Left ventricular ejection fraction, by estimation, is 60 to 65%. The left ventricle has normal function. The left ventricle has no regional wall motion abnormalities. There is mild left ventricular hypertrophy of the basal-septal segment. Left ventricular diastolic parameters are consistent with Grade  III diastolic dysfunction  (restrictive). Elevated left ventricular end-diastolic pressure.  2. Right ventricular systolic function is normal. The right ventricular size is normal. Tricuspid regurgitation signal is inadequate for assessing PA pressure.  3. Left atrial size was severely dilated.  4. The mitral valve is degenerative. Mild mitral valve regurgitation. No evidence of mitral stenosis.  5. The aortic valve is tricuspid. Aortic valve regurgitation is not visualized. Aortic valve sclerosis is present, with no evidence of aortic valve stenosis. Aortic valve Vmax measures 1.12 m/s.  6. The inferior vena cava is dilated in size with <50% respiratory variability, suggesting right atrial pressure of 15 mmHg. FINDINGS  Left Ventricle: Left ventricular ejection fraction, by estimation, is 60 to 65%. The left ventricle has normal function. The left ventricle has no regional wall motion abnormalities. The left ventricular internal cavity size was normal in size. There is  mild left ventricular hypertrophy of the basal-septal segment. Left ventricular diastolic parameters are consistent with Grade III diastolic dysfunction (restrictive). Elevated left ventricular end-diastolic pressure. Right Ventricle: The right ventricular size is normal. No increase in right ventricular wall thickness. Right ventricular systolic function is normal. Tricuspid regurgitation signal is inadequate for assessing PA pressure. Left Atrium: Left atrial size was severely dilated. Right Atrium: Right atrial size was normal in size. Pericardium: There is no evidence of pericardial effusion. Mitral Valve: The mitral valve is degenerative in appearance. There is mild thickening of the mitral valve leaflet(s). There is mild calcification of the mitral valve leaflet(s). Mild mitral valve regurgitation. No evidence of mitral valve stenosis. Tricuspid Valve: The tricuspid valve is normal in structure. Tricuspid valve regurgitation is trivial. No evidence of tricuspid  stenosis. Aortic Valve: The aortic valve is tricuspid. Aortic valve regurgitation is not visualized. Aortic valve sclerosis is present, with no evidence of aortic valve stenosis. Aortic valve peak gradient measures 5.0 mmHg. Pulmonic Valve: The pulmonic valve was normal in structure. Pulmonic valve regurgitation is not visualized. No evidence of pulmonic stenosis. Aorta: The aortic root is normal in size and structure. Venous: The inferior vena cava is dilated in size with less than 50% respiratory variability, suggesting right atrial pressure of 15 mmHg. IAS/Shunts: No atrial level shunt detected by color flow Doppler.  LEFT VENTRICLE PLAX 2D LVIDd:         4.50 cm   Diastology LVIDs:         2.90 cm   LV e' medial:    4.33 cm/s LV PW:         1.10 cm   LV E/e' medial:  21.4 LV IVS:        1.10 cm   LV e' lateral:   6.98 cm/s LVOT diam:     2.00 cm   LV E/e' lateral: 13.3 LV SV:         68 LV SV Index:   39 LVOT Area:     3.14 cm  RIGHT VENTRICLE            IVC RV Basal diam:  2.70 cm    IVC diam: 2.40 cm RV S prime:     8.72 cm/s TAPSE (M-mode): 1.6 cm LEFT ATRIUM             Index        RIGHT ATRIUM           Index LA diam:        5.00 cm 2.86 cm/m   RA Area:     15.30 cm LA  Vol [facility]):   90.4 ml 51.67 ml/m  RA Volume:   38.70 ml  22.12 ml/m LA Vol (A4C):   84.5 ml 48.30 ml/m LA Biplane Vol: 88.2 ml 50.42 ml/m  AORTIC VALVE                 PULMONIC VALVE AV Area (Vmax): 2.47 cm     PV Vmax:       0.75 m/s AV Vmax:        111.50 cm/s  PV Peak grad:  2.3 mmHg AV Peak Grad:   5.0 mmHg LVOT Vmax:      87.50 cm/s LVOT Vmean:     55.500 cm/s LVOT VTI:       0.216 m  AORTA Ao Root diam: 3.50 cm Ao Asc diam:  3.30 cm MITRAL VALVE MV Area (PHT): 3.60 cm    SHUNTS MV Decel Time: 211 msec    Systemic VTI:  0.22 m MV E velocity: 92.50 cm/s  Systemic Diam: 2.00 cm MV A velocity: 29.10 cm/s MV E/A ratio:  3.18 Fransico Him MD Electronically signed by Fransico Him MD Signature Date/Time: 02/02/2021/9:17:55 AM    Final      Cardiac Studies   See echo report above which demonstrates grade 3 diastolic dysfunction with preserved EF of 60%.  Patient Profile     66 y.o. male CAD status post prior CABG and multiple percutaneous interventions complicated by recurrent in-stent restenosis within the LAD, paroxysmal atrial fibrillation status post prior catheter ablation, hypertension, hyperlipidemia, type 2 diabetes mellitus, and stage II chronic kidney disease, who presents for admission from home with angina and no ECG or Troponin I abnormality.  Assessment & Plan    Chronic coronary disease with angina pectoris: Highly suspect that the patient has both epicardial and microvascular coronary disease.  Continue beta-blocker therapy.  Consider discontinuation of long-acting nitrates (as OP).  Continue ranolazine. Acute on chronic diastolic heart failure: Start SGLT2 for diastolic heart failure.  May be able to decrease intensity of furosemide if he is able to tolerate and maintain therapy with Jardiance.  Absolutely essential that the patient weighs himself daily and if there is an increase in weight by more than 3 pounds overnight or 5 pounds in a week, he should notify us.  No more than furosemide 40 mg/day.  He had been using 40 mg 1.5 tablets daily prior to admission. Stage III CKD: Start SGLT2, Jardiance for stabilization New anemia: Uncertain etiology that I have explained to the patient needs to be followed up by his primary care physician especially if hemoglobin does not improve on decreased intensity of antiplatelet/antithrombotic therapy. History of atrial fibrillation on chronic anticoagulation therapy: Close atrial fibrillation.  No atrial fibrillation in several years.  Developing anemia on Effient and Eliquis, will discontinue Eliquis therapy.  Can be resumed if patient ever develops recurrent A. fib.   Plan discharge today.  For questions or updates, please contact [facility] Please consult  [email or web address] for contact info under        Signed, Sinclair Grooms, MD  02/03/2021, 8:26 AM

## 2021-02-03 NOTE — Discharge Summary (Addendum)
The patient has been seen in conjunction with Reino Bellis, NP. All aspects of care have been considered and discussed. The patient has been personally interviewed, examined, and all clinical data has been reviewed.  See my earlier note A/C Diastolic HF is improved. Chest pain due to epicardial and microvascular angina. No repeat cath unless ECG changes, NSTEMI/STEMI by ECG and or enzymes. Discussed with the patient that treating heart failure may help control angina. Add SGLT-2 for  kidney preservation and DHF.  Discharge Summary    Patient ID: Terry Nelson MRN: [id number]; DOB: 04-Nov-1955  Admit date: 02/01/2021 Discharge date: 02/03/2021  PCP:  Mila Palmer, [place] Providers Cardiologist:  Sinclair Grooms, MD     Discharge Diagnoses    Principal Problem:   Acute diastolic heart failure [facility]) Active Problems:   Hyperlipidemia LDL goal <70   Hypertension   PAF (paroxysmal atrial fibrillation) (HCC)   Unstable angina (HCC)   Iron deficiency anemia   Diagnostic Studies/Procedures    Echo: 02/02/21   IMPRESSIONS     1. Left ventricular ejection fraction, by estimation, is 60 to 65%. The  left ventricle has normal function. The left ventricle has no regional  wall motion abnormalities. There is mild left ventricular hypertrophy of  the basal-septal segment. Left  ventricular diastolic parameters are consistent with Grade III diastolic  dysfunction (restrictive). Elevated left ventricular end-diastolic  pressure.   2. Right ventricular systolic function is normal. The right ventricular  size is normal. Tricuspid regurgitation signal is inadequate for assessing  PA pressure.   3. Left atrial size was severely dilated.   4. The mitral valve is degenerative. Mild mitral valve regurgitation. No  evidence of mitral stenosis.   5. The aortic valve is tricuspid. Aortic valve regurgitation is not  visualized. Aortic valve sclerosis is present, with no  evidence of aortic  valve stenosis. Aortic valve Vmax measures 1.12 m/s.   6. The inferior vena cava is dilated in size with <50% respiratory  variability, suggesting right atrial pressure of 15 mmHg.   FINDINGS   Left Ventricle: Left ventricular ejection fraction, by estimation, is 60  to 65%. The left ventricle has normal function. The left ventricle has no  regional wall motion abnormalities. The left ventricular internal cavity  size was normal in size. There is   mild left ventricular hypertrophy of the basal-septal segment. Left  ventricular diastolic parameters are consistent with Grade III diastolic  dysfunction (restrictive). Elevated left ventricular end-diastolic  pressure.   Right Ventricle: The right ventricular size is normal. No increase in  right ventricular wall thickness. Right ventricular systolic function is  normal. Tricuspid regurgitation signal is inadequate for assessing PA  pressure.   Left Atrium: Left atrial size was severely dilated.   Right Atrium: Right atrial size was normal in size.   Pericardium: There is no evidence of pericardial effusion.   Mitral Valve: The mitral valve is degenerative in appearance. There is  mild thickening of the mitral valve leaflet(s). There is mild  calcification of the mitral valve leaflet(s). Mild mitral valve  regurgitation. No evidence of mitral valve stenosis.   Tricuspid Valve: The tricuspid valve is normal in structure. Tricuspid  valve regurgitation is trivial. No evidence of tricuspid stenosis.   Aortic Valve: The aortic valve is tricuspid. Aortic valve regurgitation is  not visualized. Aortic valve sclerosis is present, with no evidence of  aortic valve stenosis. Aortic valve peak gradient measures 5.0 mmHg.  Pulmonic Valve: The pulmonic valve was normal in structure. Pulmonic valve  regurgitation is not visualized. No evidence of pulmonic stenosis.   Aorta: The aortic root is normal in size and structure.    Venous: The inferior vena cava is dilated in size with less than 50%  respiratory variability, suggesting right atrial pressure of 15 mmHg.   IAS/Shunts: No atrial level shunt detected by color flow Doppler.      _____________   History of Present Illness     Terry Nelson is a 66 y.o. male with past medical history including CAD, paroxysmal atrial fibrillation status post prior catheter ablation, hypertension, hyperlipidemia, type 2 diabetes mellitus, and stage II chronic kidney disease.  He previously underwent CABG x3 in 2002 and since then, has required multiple percutaneous interventions throughout the entirety of the LAD and also stenting of the distal RCA/RPDA.  In the setting of recurrent angina and LAD in-stent restenosis, he underwent PTCA and lithotripsy throughout the LAD in April 2021.  Unfortunately, he required repeat intervention with PCI and scoring balloon angioplasty throughout the LAD in August 2022.  His last catheterization was performed in September 2022, revealing relatively stable anatomy, w/ patent seq VG  OM1  OM2, atretic LIMA  LAD, and relatively stable LAD dzs, though he was noted to have recurrent ISR in the distal LAD w/ an FFR of 0.86.  Med rx was advised and ranexa 500 mg BID was added.  At office f/u in November, he was doing well, but over the past 6+ wks, he as noted progressive DOE, freq requiring 10-12 sl NTG per day.  He had COVID about 3-4 wks prior w/ severe sinus congestion, though this has resolved.  He did try taking ranexa 1000 bid, however this resulted in upper and lower extremity weakness, that resolved after reducing the dose back to 500 bid.  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  In that setting, c/p has been very frequent.     Mr. Rozario initially contacted our office on 1/16 w/ chest pain related to not having ranexa, and this was refilled.  Unfortunately, he called again 1/23 due to recurrent angina and  dyspnea, and arrangements were made for direct admission.  He finally got a bed tonight.  He did have recurrent angina when walking from admitting upstairs.  Hospital Course     CAD/Chronic chest pain: hsTn was negative. 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 No plans for repeat cardiac catheterization unless he has elevated enzymes or EKG changes given this nature of his disease.  -- continue ASA, Effient, BB, Ranexa and Imdur. May DC long acting nitrate as an outpatient   Acute on Chronic diastolic HF: LVEF of 93% with new G3DD. Noted to be volume overloaded on exam. Treated with IV lasix, net - 2.9L with improvement.  -- started on Farxiga  -- continued on lasix 26m daily, with instructions to notify if 3 lbs overnight/5 lbs in a week. Emphasized the need for daily weights at home  CKD stage II: stable at 1-1.2, elevated to 1.4 post IV lasix -- outpatient BMET  Anemia: Hgb baseline around 12, down to 10.5 on admission.  -- No reports of dark stools PTA -- will need follow up with PCP for monitoring  Hx of PAF: has been quiescent for several years. With his anemia and need for ASA/Effient, his Eliquis will be stopped. Can be resumed if redevelopment of  afib in the future. -- continue BB  HTN: blood pressures soft at times but tolerating medications -- continue home regimen of Toprol 273m daily, norvasc 130mdaily, spiro 2572maily, Imdur 120m60mily -- instructed to follow up BP as home and bring to follow up   DM: home glipizide stopped -- continue home insulin -- added SGLT2  Did the patient have an acute coronary syndrome (MI, NSTEMI, STEMI, etc) this admission?:  No                               Did the patient have a percutaneous coronary intervention (stent / angioplasty)?:  No.     The patient will be scheduled for a TOC follow up appointment in 10-14 days.  A message has been sent to the TOC [facility] Scheduling Pool at the office where the patient should be  seen for follow up.  _____________  Discharge Vitals Blood pressure 111/67, pulse 66, temperature 98.3 F (36.8 C), temperature source Oral, resp. rate 15, height _0  (1.676 m), weight 66.2 kg, SpO2 100 %.  Filed Weights   02/01/21 2021 02/02/21 0359  Weight: 68.7 kg 66.2 kg    Labs & Radiologic Studies    CBC Recent Labs    02/01/21 2053 02/02/21 0358 02/03/21 0143  WBC 10.7* 7.4 6.7  NEUTROABS 8.1*  --   --   HGB 12.1* 10.5* 10.1*  HCT 34.7* 30.3* 30.2*  MCV 95.1 96.2 96.2  PLT 182 148* 168 845asic Metabolic Panel Recent Labs    02/01/21 2053 02/02/21 0359 02/03/21 0143  NA 135 137 136  K 4.2 4.0 3.6  CL 99 100 98  CO2 _1 GLUCOSE 124* 115* 91  BUN _2 CREATININE 1.27* 1.23 1.48*  CALCIUM 9.3 9.3 9.2  MG 2.1  --   --    Liver Function Tests Recent Labs    02/01/21 2053  AST 23  ALT 14  ALKPHOS 84  BILITOT 0.7  PROT 7.5  ALBUMIN 4.3   No results for input(s): LIPASE, AMYLASE in the last 72 hours. High Sensitivity Troponin:   Recent Labs  Lab 02/01/21 2053 02/01/21 2305  TROPONINIHS 6 8    BNP Invalid input(s): POCBNP D-Dimer No results for input(s): DDIMER in the last 72 hours. Hemoglobin A1C Recent Labs    02/03/21 0143  HGBA1C 6.3*   Fasting Lipid Panel Recent Labs    02/02/21 0358  CHOL 106  HDL 45  LDLCALC 47  TRIG 69  CHOLHDL 2.4   Thyroid Function Tests No results for input(s): TSH, T4TOTAL, T3FREE, THYROIDAB in the last 72 hours.  Invalid input(s): FREET3 _____________  ECHOCARDIOGRAM COMPLETE  Result Date: 02/02/2021    ECHOCARDIOGRAM REPORT   Patient Name:   Terry Nelson Date of Exam: 02/02/2021 Medical Rec #:  [id number] Height:       66.0 in Accession #:    [id number]:       146.0 lb Date of Birth:  [birth year] BSA:          1.749 m Patient Age:    66 y17rs      BP:           112/73 mmHg Patient Gender: M             HR:  63 bpm. Exam Location:  Inpatient Procedure: 2D Echo  Indications:    Dyspnea  History:        Patient has prior history of Echocardiogram examinations, most                 recent 09/30/2020. CAD; Risk Factors:Hypertension.  Sonographer:    Jefferey Pica Referring Phys: [id number] CHRISTOPHER RONALD [place]  1. Left ventricular ejection fraction, by estimation, is 60 to 65%. The left ventricle has normal function. The left ventricle has no regional wall motion abnormalities. There is mild left ventricular hypertrophy of the basal-septal segment. Left ventricular diastolic parameters are consistent with Grade III diastolic dysfunction (restrictive). Elevated left ventricular end-diastolic pressure.  2. Right ventricular systolic function is normal. The right ventricular size is normal. Tricuspid regurgitation signal is inadequate for assessing PA pressure.  3. Left atrial size was severely dilated.  4. The mitral valve is degenerative. Mild mitral valve regurgitation. No evidence of mitral stenosis.  5. The aortic valve is tricuspid. Aortic valve regurgitation is not visualized. Aortic valve sclerosis is present, with no evidence of aortic valve stenosis. Aortic valve Vmax measures 1.12 m/s.  6. The inferior vena cava is dilated in size with <50% respiratory variability, suggesting right atrial pressure of 15 mmHg. FINDINGS  Left Ventricle: Left ventricular ejection fraction, by estimation, is 60 to 65%. The left ventricle has normal function. The left ventricle has no regional wall motion abnormalities. The left ventricular internal cavity size was normal in size. There is  mild left ventricular hypertrophy of the basal-septal segment. Left ventricular diastolic parameters are consistent with Grade III diastolic dysfunction (restrictive). Elevated left ventricular end-diastolic pressure. Right Ventricle: The right ventricular size is normal. No increase in right ventricular wall thickness. Right ventricular systolic function is normal. Tricuspid regurgitation  signal is inadequate for assessing PA pressure. Left Atrium: Left atrial size was severely dilated. Right Atrium: Right atrial size was normal in size. Pericardium: There is no evidence of pericardial effusion. Mitral Valve: The mitral valve is degenerative in appearance. There is mild thickening of the mitral valve leaflet(s). There is mild calcification of the mitral valve leaflet(s). Mild mitral valve regurgitation. No evidence of mitral valve stenosis. Tricuspid Valve: The tricuspid valve is normal in structure. Tricuspid valve regurgitation is trivial. No evidence of tricuspid stenosis. Aortic Valve: The aortic valve is tricuspid. Aortic valve regurgitation is not visualized. Aortic valve sclerosis is present, with no evidence of aortic valve stenosis. Aortic valve peak gradient measures 5.0 mmHg. Pulmonic Valve: The pulmonic valve was normal in structure. Pulmonic valve regurgitation is not visualized. No evidence of pulmonic stenosis. Aorta: The aortic root is normal in size and structure. Venous: The inferior vena cava is dilated in size with less than 50% respiratory variability, suggesting right atrial pressure of 15 mmHg. IAS/Shunts: No atrial level shunt detected by color flow Doppler.  LEFT VENTRICLE PLAX 2D LVIDd:         4.50 cm   Diastology LVIDs:         2.90 cm   LV e' medial:    4.33 cm/s LV PW:         1.10 cm   LV E/e' medial:  21.4 LV IVS:        1.10 cm   LV e' lateral:   6.98 cm/s LVOT diam:     2.00 cm   LV E/e' lateral: 13.3 LV SV:         68 LV SV Index:  39 LVOT Area:     3.14 cm  RIGHT VENTRICLE            IVC RV Basal diam:  2.70 cm    IVC diam: 2.40 cm RV S prime:     8.72 cm/s TAPSE (M-mode): 1.6 cm LEFT ATRIUM             Index        RIGHT ATRIUM           Index LA diam:        5.00 cm 2.86 cm/m   RA Area:     15.30 cm LA Vol (A2C):   90.4 ml 51.67 ml/m  RA Volume:   38.70 ml  22.12 ml/m LA Vol (A4C):   84.5 ml 48.30 ml/m LA Biplane Vol: 88.2 ml 50.42 ml/m  AORTIC VALVE                  PULMONIC VALVE AV Area (Vmax): 2.47 cm     PV Vmax:       0.75 m/s AV Vmax:        111.50 cm/s  PV Peak grad:  2.3 mmHg AV Peak Grad:   5.0 mmHg LVOT Vmax:      87.50 cm/s LVOT Vmean:     55.500 cm/s LVOT VTI:       0.216 m  AORTA Ao Root diam: 3.50 cm Ao Asc diam:  3.30 cm MITRAL VALVE MV Area (PHT): 3.60 cm    SHUNTS MV Decel Time: 211 msec    Systemic VTI:  0.22 m MV E velocity: 92.50 cm/s  Systemic Diam: 2.00 cm MV A velocity: 29.10 cm/s MV E/A ratio:  3.18 Fransico Him MD Electronically signed by Fransico Him MD Signature Date/Time: 02/02/2021/9:17:55 AM    Final    Disposition   Pt is being discharged home today in good condition.  Follow-up Plans & Appointments     Follow-up Information     Loel Dubonnet, NP Follow up on 02/13/2021.   Specialty: Cardiology Why: at 2:20pm for your follow up appt with Dr. Darliss Ridgel' NP Donnetta Hail information: [place] [address] [phone]         Belva Crome, MD Follow up on 05/24/2021.   Specialty: Cardiology Why: at 1:20pm for your follow up appt Contact information: [address] [address] [phone]                Discharge Instructions     (HEART FAILURE PATIENTS) Call MD:  Anytime you have any of the following symptoms: 1) 3 pound weight gain in 24 hours or 5 pounds in 1 week 2) shortness of breath, with or without a dry hacking cough 3) swelling in the hands, feet or stomach 4) if you have to sleep on extra pillows at night in order to breathe.   Complete by: As directed    Diet - low sodium heart healthy   Complete by: As directed    Increase activity slowly   Complete by: As directed        Discharge Medications   Allergies as of 02/03/2021       Reactions   Metoclopramide Other (See Comments)   DYSKINESIAS contractions of whole body   Penicillins Swelling, Rash   Did it involve swelling of the face/tongue/throat, SOB, or low BP? Yes Did  it involve sudden or severe rash/hives, skin peeling, or any reaction on the inside  of your mouth or nose? Unknown Did you need to seek medical attention at a hospital or doctor's office? Yes When did it last happen?More than 40 years ago    If all above answers are NO, may proceed with cephalosporin use.        Medication List     STOP taking these medications    apixaban 5 MG Tabs tablet Commonly known as: Eliquis   glipiZIDE 10 MG 24 hr tablet Commonly known as: GLUCOTROL XL       TAKE these medications    amLODipine 10 MG tablet Commonly known as: NORVASC Take 10 mg by mouth daily.   Aspirin Low Dose 81 MG EC tablet Generic drug: aspirin Take 1 tablet (81 mg total) by mouth daily. Swallow whole. Start taking on: February 04, 2021   atorvastatin 80 MG tablet Commonly known as: LIPITOR Take 80 mg by mouth at bedtime.   BD Pen Needle Nano U/F 32G X 4 MM Misc Generic drug: Insulin Pen Needle USE ONCE AS DIRECTED   Blink Tears 0.25 % Soln Generic drug: Polyethylene Glycol 400 Place 1 drop into both eyes daily as needed (dry eyes).   Contour Next Monitor w/Device Kit daily.   Contour Next Test test strip Generic drug: glucose blood by Does not apply route.   Farxiga 10 MG Tabs tablet Generic drug: dapagliflozin propanediol Take 1 tablet (10 mg total) by mouth daily. Start taking on: February 04, 2021   furosemide 40 MG tablet Commonly known as: LASIX Take 40 mg by mouth daily.   gabapentin 300 MG capsule Commonly known as: NEURONTIN Take 1,200 mg by mouth 2 (two) times daily.   isosorbide mononitrate 60 MG 24 hr tablet Commonly known as: IMDUR Take 120 mg by mouth daily.   magnesium oxide 400 MG tablet Commonly known as: MAG-OX Take 800 mg by mouth daily.   metoprolol 200 MG 24 hr tablet Commonly known as: Toprol XL Take 1 tablet (200 mg total) by mouth daily.   nitroGLYCERIN 0.4 MG SL tablet Commonly known as: NITROSTAT Place 1 tablet (0.4  mg total) under the tongue every 5 (five) minutes as needed for chest pain.   oxyCODONE-acetaminophen 10-325 MG tablet Commonly known as: PERCOCET Take 1 tablet by mouth every 4 (four) hours as needed for pain.   pantoprazole 40 MG tablet Commonly known as: PROTONIX Take 40 mg by mouth daily.   prasugrel 10 MG Tabs tablet Commonly known as: EFFIENT Take 10 mg by mouth daily.   promethazine 25 MG tablet Commonly known as: PHENERGAN Take 25 mg by mouth 2 (two) times daily as needed for nausea or vomiting.   ranolazine 500 MG 12 hr tablet Commonly known as: Ranexa Take 1 tablet (500 mg total) by mouth 2 (two) times daily.   spironolactone 25 MG tablet Commonly known as: ALDACTONE Take 25 mg by mouth daily.   tiZANidine 4 MG tablet Commonly known as: ZANAFLEX Take 4-8 mg by mouth See admin instructions. Take 4 mg by mouth in the morning, afternoon and dinner and 8 mg bedtime   Toujeo SoloStar 300 UNIT/ML Solostar Pen Generic drug: insulin glargine (1 Unit Dial) Inject 32 Units into the skin daily.   traZODone 50 MG tablet Commonly known as: DESYREL Take 50 mg by mouth at bedtime as needed for sleep.        Outstanding Labs/Studies   BMET at follow up appt  Duration of Discharge Encounter   Greater than 30 minutes including physician  time.  Signed, Reino Bellis, NP 02/03/2021, 10:44 AM

## 2021-02-03 NOTE — Progress Notes (Signed)
IV removed, discharge instructions reviewed with pt and pt verbalized understanding.  Meds delivered to pt from [facility] pharmacy.

## 2021-02-03 NOTE — Progress Notes (Signed)
Mobility Specialist Progress Note    02/03/21 1024  Mobility  Activity Ambulated independently in hallway  Level of Assistance Independent  Assistive Device None  Distance Ambulated (ft) 480 ft  Activity Response Tolerated well  $Mobility charge 1 Mobility   Pre-Mobility: 68 HR, 100% SpO2 During Mobility: 76 HR Post-Mobility: 68 HR, 145/84 BP  Pt received standing in room and agreeable. No complaints on walk. Returned to sitting EOB.   [facility] Mobility Specialist  M.S. 2C and 6E: [phone] M.S. 4E: [phone]

## 2021-02-13 ENCOUNTER — Other Ambulatory Visit: Payer: Self-pay

## 2021-02-13 ENCOUNTER — Ambulatory Visit (INDEPENDENT_AMBULATORY_CARE_PROVIDER_SITE_OTHER): Payer: BC Managed Care – PPO | Admitting: Physician Assistant

## 2021-02-13 ENCOUNTER — Encounter (HOSPITAL_BASED_OUTPATIENT_CLINIC_OR_DEPARTMENT_OTHER): Payer: Self-pay | Admitting: Physician Assistant

## 2021-02-13 VITALS — BP 124/72 | HR 69 | Ht 67.0 in | Wt 150.6 lb

## 2021-02-13 DIAGNOSIS — I5033 Acute on chronic diastolic (congestive) heart failure: Secondary | ICD-10-CM

## 2021-02-13 DIAGNOSIS — I25110 Atherosclerotic heart disease of native coronary artery with unstable angina pectoris: Secondary | ICD-10-CM | POA: Diagnosis not present

## 2021-02-13 DIAGNOSIS — I2510 Atherosclerotic heart disease of native coronary artery without angina pectoris: Secondary | ICD-10-CM

## 2021-02-13 DIAGNOSIS — Z7189 Other specified counseling: Secondary | ICD-10-CM

## 2021-02-13 DIAGNOSIS — I5032 Chronic diastolic (congestive) heart failure: Secondary | ICD-10-CM | POA: Diagnosis not present

## 2021-02-13 DIAGNOSIS — E785 Hyperlipidemia, unspecified: Secondary | ICD-10-CM

## 2021-02-13 DIAGNOSIS — I10 Essential (primary) hypertension: Secondary | ICD-10-CM

## 2021-02-13 DIAGNOSIS — I480 Paroxysmal atrial fibrillation: Secondary | ICD-10-CM

## 2021-02-13 MED ORDER — ISOSORBIDE MONONITRATE ER 60 MG PO TB24: 180.0000 mg | ORAL_TABLET | Freq: Every day | ORAL | 3 refills | Status: DC

## 2021-02-13 NOTE — Patient Instructions (Addendum)
Medication Instructions:  Your physician has recommended you make the following change in your medication:   CHANGE Isosorbide Mononitrate (Imdur) to 180mg  daily (three 60mg  tablets daily)  *If you need a refill on your cardiac medications before your next appointment, please call your pharmacy*   Lab Work: Your physician recommends that you return for lab work in 1-2 weeks for BMP, BNP   If you have labs (blood work) drawn today and your tests are completely normal, you will receive your results only by: MyChart Message (if you have MyChart) OR A paper copy in the mail If you have any lab test that is abnormal or we need to change your treatment, we will call you to review the results.   Testing/Procedures: None ordered today.   Follow-Up: At [facility], you and your health needs are our priority.  As part of our continuing mission to provide you with exceptional heart care, we have created designated Provider Care Teams.  These Care Teams include your primary Cardiologist (physician) and Advanced Practice Providers (APPs -  Physician Assistants and Nurse Practitioners) who all work together to provide you with the care you need, when you need it.  We recommend signing up for the patient portal called "MyChart".  Sign up information is provided on this After Visit Summary.  MyChart is used to connect with patients for Virtual Visits (Telemedicine).  Patients are able to view lab/test results, encounter notes, upcoming appointments, etc.  Non-urgent messages can be sent to your provider as well.   To learn more about what you can do with MyChart, go to .    Your next appointment:   In 4-6 weeks with [facility] or [email or web address], NP  AND As scheduled in May with Dr. Alver Sorrow   Other Instructions  Heart Healthy Diet Recommendations: A low-salt diet is recommended. Meats should be grilled, baked, or boiled. Avoid fried foods. Focus on lean protein  sources like fish or chicken with vegetables and fruits. The American Heart Association is a June!  American Heart Association Diet and Lifeystyle Recommendations    Exercise recommendations: The American Heart Association recommends 150 minutes of moderate intensity exercise weekly. Try 30 minutes of moderate intensity exercise 4-5 times per week. This could include walking, jogging, or swimming.

## 2021-02-13 NOTE — Progress Notes (Signed)
Office Visit    Patient Name: Terry Nelson Date of Encounter: 02/13/2021  PCP:  Mila Palmer, [place]  Cardiologist:  Sinclair Grooms, MD  Advanced Practice Provider:  No care team member to display Electrophysiologist:  None   Chief Complaint    Terry Nelson is a 66 y.o. male with a hx of CAD s/p CABG in 2002 by Dr. Roxan Hockey, overlapping proximal to distal LAD stents with ISR, multiple subsequent balloon angioplasty procedures,, paroxysmal atrial fibrillation s/p ablation, DMII, hypertension, hyperlipemia, stage II chronic kidney disease presents today for hospital follow-up. He was considered for high risk orbital atherectomy/rotational atherectomy for poorly expanded previously placed 2 layer stent offered but at that time patient has not yet decided to move forward.   At his last appointment November 2022 he felt like the Ranexa was helping.His Ranexa was increased to 1,$RemoveBefore'000mg'bKGtPPuybIrgf$  twice a day.  This dose resulted in upper and lower extremity weakness that resolved after reducing the dose to 500 mg twice daily.  At that time he was having progressive DOE requiring 10-12 nitroglycerin a day.  He also had COVID about 3 to 4 weeks prior with severe sinus congestion although this had resolved.  He then contacted our office on 01/23/2021 with chest pain related to not having Ranexa and this was refilled.  Unfortunately called again 01/30/2021 with recurrent angina and dyspnea and arrangements were made for direct admission.  He did have recurrent angina when walking from admitting upstairs.  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  There were no plans for repeat cardiac catheterization unless he had elevated enzymes or EKG changes given the nature of his disease.  He was noted to be volume overloaded on exam and treated with IV Lasix.  He was started on Farxiga right.  We continued his Lasix 40 mg daily and emphasized the need for daily weights  at home.  Today, he presents to the office and is still having chest pain when he is up moving around and occasionally at rest.  He shares that they stopped his Eliquis when he was in the hospital.  He does have some associated shortness of breath.  He was started on Farxiga while in the hospital and a Bmp was performed which showed his creatinine bumped to 1.48.  He continues to take nitroglycerin tabs daily.  We discussed increasing his Imdur to help with his chest pain.  He also informs me that he was hypoglycemic in the hospital initially.  Although, he has been keeping track of his blood glucose level and they have been within normal range usually 90s fasting.  He does endorse having some lower extremity edema at times although this is much better than prior to his hospitalization.  Reports no palpitations.     Past Medical History    Past Medical History:  Diagnosis Date   Anxiety state    Body mass index (bmi) 26.0-26.9, adult    CAD (coronary artery disease)    a. 2002 s/p CABG x 3 (LIMA->LAD, VG->OM1->OM2); b. s/p prior LAD and dRCA/RPDA stenting; c. 04/2019 PTCA & lithotripsy of ISR throughout LAD; d. 08/2020 PCI/scoring balloon PTCA throughout LAD 2/2 ISR; e. 09/2020 Cath: LM nl, LAD 40ost/p, 40p/d, 75d ISR (FFR 0.86), 50d, LCX small, OM1 90, OM2 25, RCA 20d ISR, RPDA 20 ISR, VG->OM1->OM2 ok, LIMA->LAD 148m-->Med rx.   Chronic heart failure with preserved ejection fraction (HFpEF) ([place])  a. 09/2020 Echo: Ef 60-65%, no rwma, nl RV size/fxn. Mod dil LA. triv MR. Mild AoV sclerosis w/o stenosis.   CKD (chronic kidney disease), stage II    DDD (degenerative disc disease), lumbar    DM (diabetes mellitus) ([place])    Gastritis    Headache    Hemorrhage    Hyperlipidemia    Hypertension    Kidney stones    Lumbago    Neck pain    Paroxysmal A-fib ([place])    a. CHA2DS2VASc = 4-->Eliquis; b. 06/2020 Zio: No afib. Up to 20 beats PSVT.   Pernicious anemia    Unstable angina (HCC)    Past  Surgical History:  Procedure Laterality Date   ABLATION  2017   CARDIAC CATHETERIZATION     CORONARY ARTERY BYPASS GRAFT     CORONARY BALLOON ANGIOPLASTY N/A 04/27/2019   Procedure: CORONARY BALLOON ANGIOPLASTY;  Surgeon: Nelva Bush, MD;  Location: [facility];  Service: Cardiovascular;  Laterality: N/A;   CORONARY BALLOON ANGIOPLASTY N/A 12/16/2019   Procedure: CORONARY BALLOON ANGIOPLASTY;  Surgeon: Belva Crome, MD;  Location: [facility] [place];  Service: Cardiovascular;  Laterality: N/A;   CORONARY BALLOON ANGIOPLASTY N/A 09/06/2020   Procedure: CORONARY BALLOON ANGIOPLASTY;  Surgeon: Belva Crome, MD;  Location: [facility];  Service: Cardiovascular;  Laterality: N/A;   INTRAVASCULAR PRESSURE WIRE/FFR STUDY N/A 09/30/2020   Procedure: INTRAVASCULAR PRESSURE WIRE/FFR STUDY;  Surgeon: Early Osmond, MD;  Location: [facility];  Service: Cardiovascular;  Laterality: N/A;   INTRAVASCULAR ULTRASOUND/IVUS N/A 04/27/2019   Procedure: Intravascular Ultrasound/IVUS;  Surgeon: Nelva Bush, MD;  Location: [facility];  Service: Cardiovascular;  Laterality: N/A;   INTRAVASCULAR ULTRASOUND/IVUS N/A 12/16/2019   Procedure: Intravascular Ultrasound/IVUS;  Surgeon: Belva Crome, MD;  Location: [facility];  Service: Cardiovascular;  Laterality: N/A;   LEFT HEART CATH AND CORS/GRAFTS ANGIOGRAPHY N/A 04/09/2019   Procedure: LEFT HEART CATH AND CORS/GRAFTS ANGIOGRAPHY;  Surgeon: Belva Crome, MD;  Location: [facility];  Service: Cardiovascular;  Laterality: N/A;   LEFT HEART CATH AND CORS/GRAFTS ANGIOGRAPHY N/A 12/16/2019   Procedure: LEFT HEART CATH AND CORS/GRAFTS ANGIOGRAPHY;  Surgeon: Belva Crome, MD;  Location: [facility];  Service: Cardiovascular;  Laterality: N/A;   LEFT HEART CATH AND CORS/GRAFTS ANGIOGRAPHY N/A 09/06/2020   Procedure: LEFT HEART CATH AND CORS/GRAFTS ANGIOGRAPHY;  Surgeon: Belva Crome, MD;  Location: [facility];  Service: Cardiovascular;  Laterality: N/A;   LEFT HEART CATH AND CORS/GRAFTS ANGIOGRAPHY N/A 09/30/2020   Procedure: LEFT HEART CATH AND CORS/GRAFTS ANGIOGRAPHY;  Surgeon: Early Osmond, MD;  Location: [place] [facility];  Service: Cardiovascular;  Laterality: N/A;    Allergies  Allergies  Allergen Reactions   Metoclopramide Other (See Comments)    DYSKINESIAS contractions of whole body   Penicillins Swelling and Rash    Did it involve swelling of the face/tongue/throat, SOB, or low BP? Yes Did it involve sudden or severe rash/hives, skin peeling, or any reaction on the inside of your mouth or nose? Unknown Did you need to seek medical attention at a hospital or doctor's office? Yes When did it last happen?More than 40 years ago    If all above answers are NO, may proceed with cephalosporin use.     EKGs/Labs/Other Studies Reviewed:   The following studies were reviewed today:  Echocardiogram 02/02/2021 IMPRESSIONS     1. Left ventricular ejection  fraction, by estimation, is 60 to 65%. The  left ventricle has normal function. The left ventricle has no regional  wall motion abnormalities. There is mild left ventricular hypertrophy of  the basal-septal segment. Left  ventricular diastolic parameters are consistent with Grade III diastolic  dysfunction (restrictive). Elevated left ventricular end-diastolic  pressure.   2. Right ventricular systolic function is normal. The right ventricular  size is normal. Tricuspid regurgitation signal is inadequate for assessing  PA pressure.   3. Left atrial size was severely dilated.   4. The mitral valve is degenerative. Mild mitral valve regurgitation. No  evidence of mitral stenosis.   5. The aortic valve is tricuspid. Aortic valve regurgitation is not  visualized. Aortic valve sclerosis is present, with no evidence of aortic  valve stenosis. Aortic valve Vmax measures 1.12 m/s.   6. The inferior vena cava is dilated in size  with <50% respiratory  variability, suggesting right atrial pressure of 15 mmHg.   EKG:  EKG is not ordered today.   Recent Labs: 09/30/2020: B Natriuretic Peptide 98.2 02/01/2021: ALT 14; Magnesium 2.1 02/03/2021: BUN 19; Creatinine, Ser 1.48; Hemoglobin 10.1; Platelets 168; Potassium 3.6; Sodium 136  Recent Lipid Panel    Component Value Date/Time   CHOL 106 02/02/2021 0358   CHOL 136 03/30/2019 1234   TRIG 69 02/02/2021 0358   HDL 45 02/02/2021 0358   HDL 36 (L) 03/30/2019 1234   CHOLHDL 2.4 02/02/2021 0358   VLDL 14 02/02/2021 0358   LDLCALC 47 02/02/2021 0358   LDLCALC 70 03/30/2019 1234     Home Medications   Current Meds  Medication Sig   amLODipine (NORVASC) 10 MG tablet Take 10 mg by mouth daily.   aspirin 81 MG EC tablet Take 1 tablet (81 mg total) by mouth daily. Swallow whole.   atorvastatin (LIPITOR) 80 MG tablet Take 80 mg by mouth at bedtime.   Blood Glucose Monitoring Suppl (CONTOUR NEXT MONITOR) w/Device KIT daily.   dapagliflozin propanediol (FARXIGA) 10 MG TABS tablet Take 1 tablet (10 mg total) by mouth daily.   furosemide (LASIX) 40 MG tablet Take 40 mg by mouth daily.   gabapentin (NEURONTIN) 300 MG capsule Take 1,200 mg by mouth 2 (two) times daily.   glucose blood (CONTOUR NEXT TEST) test strip by Does not apply route.   Insulin Pen Needle (BD PEN NEEDLE NANO U/F) 32G X 4 MM MISC USE ONCE AS DIRECTED   isosorbide mononitrate (IMDUR) 60 MG 24 hr tablet Take 120 mg by mouth daily.   magnesium oxide (MAG-OX) 400 MG tablet Take 800 mg by mouth daily.   metoprolol (TOPROL XL) 200 MG 24 hr tablet Take 1 tablet (200 mg total) by mouth daily.   nitroGLYCERIN (NITROSTAT) 0.4 MG SL tablet Place 1 tablet (0.4 mg total) under the tongue every 5 (five) minutes as needed for chest pain.   oxyCODONE-acetaminophen (PERCOCET) 10-325 MG tablet Take 1 tablet by mouth every 4 (four) hours as needed for pain.   pantoprazole (PROTONIX) 40 MG tablet Take 40 mg by mouth daily.    Polyethylene Glycol 400 (BLINK TEARS) 0.25 % SOLN Place 1 drop into both eyes daily as needed (dry eyes).   prasugrel (EFFIENT) 10 MG TABS tablet Take 10 mg by mouth daily.   promethazine (PHENERGAN) 25 MG tablet Take 25 mg by mouth 2 (two) times daily as needed for nausea or vomiting.   ranolazine (RANEXA) 500 MG 12 hr tablet Take 1 tablet (500 mg total) by  mouth 2 (two) times daily.   spironolactone (ALDACTONE) 25 MG tablet Take 25 mg by mouth daily.   tiZANidine (ZANAFLEX) 4 MG tablet Take 4-8 mg by mouth See admin instructions. Take 4 mg by mouth in the morning, afternoon and dinner and 8 mg bedtime   TOUJEO SOLOSTAR 300 UNIT/ML Solostar Pen Inject 32 Units into the skin daily.   traZODone (DESYREL) 50 MG tablet Take 50 mg by mouth at bedtime as needed for sleep.     Review of Systems      All other systems reviewed and are otherwise negative except as noted above.  Physical Exam    VS:  BP 124/72    Pulse 69    Ht $R'5\' 7"'tZ$  (1.702 m)    Wt 150 lb 9.6 oz (68.3 kg)    SpO2 95%    BMI 23.59 kg/m  , BMI Body mass index is 23.59 kg/m.  Wt Readings from Last 3 Encounters:  02/13/21 150 lb 9.6 oz (68.3 kg)  02/02/21 146 lb (66.2 kg)  11/23/20 150 lb 11.2 oz (68.4 kg)     GEN: Well nourished, well developed, in no acute distress. HEENT: normal. Neck: Supple, no JVD, carotid bruits, or masses. Cardiac: RRR, no murmurs, rubs, or gallops. No clubbing, cyanosis, edema.  Radials/PT 2+ and equal bilaterally.  Respiratory:  Respirations regular and unlabored, clear to auscultation bilaterally. GI: Soft, nontender, nondistended. MS: No deformity or atrophy. Skin: Warm and dry, no rash. Neuro:  Strength and sensation are intact. Psych: Normal affect.  Assessment & Plan    Chest pain -Nitro helps, had several yesterday -Thought to be microvascular disease -Increase Imdur to 180 mg daily, new prescription provided today  2. Acute on Chronic HFpEF -"Dry weight" 144 which is what he  weighted this morning at home -would not increase lasix at this time with elevated creatinine -BMP in 2 weeks -Continue Farxiga -Continue daily weights.  Being more than 2 to 3 pounds overnight or 5 pounds in a week please call our office  3. COVID 19 infection -no residual symptoms at this time  4. Anemia -last hemoglobin 10.1 -continue to monitor closely  5. Hx of PAF - inactive for a while now -Eliquis stopped due to anemia -Continue BB therapy  6. HTN -Well controlled today in the clinic -Encouraged him to continue taking his BP at home and recording -Encouraged a low-sodium, heart healthy diet  7. Hypoglycemia -occurred in the hospital -He has been monitoring his glucose level closely and he has not had another hypoglycemic event    Disposition: Follow up 1 month with Sinclair Grooms, MD or APP.  Signed, Elgie Collard, PA-C 02/13/2021, 2:24 PM [place]

## 2021-02-16 ENCOUNTER — Telehealth: Payer: Self-pay | Admitting: Interventional Cardiology

## 2021-02-16 MED ORDER — ISOSORBIDE MONONITRATE ER 60 MG PO TB24: 120.0000 mg | ORAL_TABLET | Freq: Every day | ORAL | 3 refills | Status: DC

## 2021-02-16 NOTE — Telephone Encounter (Signed)
Reached out to [facility] about decreasing Imdur to 150mg  QD for a few days and then going back to 180mg  to see if that helps with fatigue.  She said that would be fine.  Spoke with pt and he states he's very fatigued since increasing Imdur to 180mg  and BP today was 84/52, HR 52.  Advised pt to go ahead and back off to 150mg  QD as instructed.  Advised to go ahead and drink a little extra fluid now to help pull pressure up but not to overdo it due to his grade 3 diastolic dysfunction.  Advised if BP still remains too low over the next few days to call us back and let us know.  States he doesn't really notice a difference in his CP and would rather deal with a little extra pain than to be this fatigued with low BP.  Pt appreciative for call. Message has been sent to Dr. Tamala Julian.  Will see if he has any further recommendations.

## 2021-02-16 NOTE — Telephone Encounter (Signed)
Spoke with pt and made him aware Julian Hy said ok to go back to taking 120mg  QD of the Imdur.  Pt agreeable to plan.

## 2021-02-16 NOTE — Telephone Encounter (Signed)
184/52 HR54  Pt c/o medication issue:  1. Name of Medication: isosorbide mononitrate (IMDUR) 60 MG 24 hr tablet  2. How are you currently taking this medication (dosage and times per day)? Take 3 tablets (180 mg total) by mouth daily.  3. Are you having a reaction (difficulty breathing--STAT)? no  4. What is your medication issue? Patient callling in to say his bp is 184/52 HR 52. He states that he doesn't have any energy after medication has been increase

## 2021-02-20 ENCOUNTER — Encounter (HOSPITAL_BASED_OUTPATIENT_CLINIC_OR_DEPARTMENT_OTHER): Payer: Self-pay

## 2021-02-21 DIAGNOSIS — L03012 Cellulitis of left finger: Secondary | ICD-10-CM | POA: Diagnosis not present

## 2021-02-21 DIAGNOSIS — R519 Headache, unspecified: Secondary | ICD-10-CM | POA: Diagnosis not present

## 2021-03-03 ENCOUNTER — Telehealth: Payer: Self-pay | Admitting: Interventional Cardiology

## 2021-03-03 NOTE — Telephone Encounter (Signed)
° ° °*  STAT* If patient is at the pharmacy, call can be transferred to refill team.   1. Which medications need to be refilled? (please list name of each medication and dose if known) dapagliflozin propanediol (FARXIGA) 10 MG TABS tablet  2. Which pharmacy/location (including street and city if local pharmacy) is medication to be sent to?[place], [place]  3. Do they need a 30 day or 90 day supply? 30 days  Pt will be out of meds tomorrow need refill today

## 2021-03-06 MED ORDER — DAPAGLIFLOZIN PROPANEDIOL 10 MG PO TABS: 10.0000 mg | ORAL_TABLET | Freq: Every day | ORAL | 3 refills | Status: DC

## 2021-03-06 NOTE — Telephone Encounter (Signed)
Pt's medication was sent to pt's pharmacy as requested. Confirmation received.  °

## 2021-03-08 ENCOUNTER — Telehealth: Payer: Self-pay | Admitting: Interventional Cardiology

## 2021-03-08 ENCOUNTER — Other Ambulatory Visit (HOSPITAL_COMMUNITY): Payer: Self-pay

## 2021-03-08 NOTE — Telephone Encounter (Signed)
Pt aware of recommendations and agrees with plan ./cy 

## 2021-03-08 NOTE — Telephone Encounter (Signed)
Pt c/o swelling: STAT is pt has developed SOB within 24 hours ? ?If swelling, where is the swelling located? Hands swollen some, primarily feet and legs ? ?How much weight have you gained and in what time span? 13 lb weight gain in 1 week ? ?Have you gained 3 pounds in a day or 5 pounds in a week? 13 lbs in a week ? ?Do you have a log of your daily weights (if so, list)? He does but did not provide them. Patient did not provide day to day weight gain to determine if call was STAT or not  ? ?Are you currently taking a fluid pill? Yes. Currently taking 40 mg in the AM and went up to 40 mg in the PM (was only taking 20 mg in PM).  ? ?Are you currently SOB? No, but pt gets SOB after taking about 20 steps anywhere ? ?Have you traveled recently? Patient drove to [place] yesterday and is there now. H will be back in [place] tomorrow  ? ? ?Patient said that today was the first time that he has been able to wear his wedding band, as he is normally not swollen but today he is  ?

## 2021-03-08 NOTE — Telephone Encounter (Signed)
Spoke with pt re message pt is SOB and has significant swelling noted Per pt no change salt intake . Pt yesterday started to take 40 mg of Furosemide instead of 20 mg in the pm  and does take 40 mg in the am Per pt urinates a lot in the am but does not in the pm no matter time of day he takes diuretic  Pt is out of town today will be back home tomorrow Will forward to Dr Katrinka Blazing for review and recommendations ./cy ?

## 2021-03-22 DIAGNOSIS — J019 Acute sinusitis, unspecified: Secondary | ICD-10-CM | POA: Diagnosis not present

## 2021-03-28 ENCOUNTER — Ambulatory Visit (INDEPENDENT_AMBULATORY_CARE_PROVIDER_SITE_OTHER): Payer: BC Managed Care – PPO | Admitting: Family

## 2021-03-28 ENCOUNTER — Other Ambulatory Visit: Payer: Self-pay

## 2021-03-28 ENCOUNTER — Encounter (HOSPITAL_BASED_OUTPATIENT_CLINIC_OR_DEPARTMENT_OTHER): Payer: Self-pay | Admitting: Family

## 2021-03-28 VITALS — BP 124/80 | HR 68 | Ht 67.0 in | Wt 153.2 lb

## 2021-03-28 DIAGNOSIS — I2510 Atherosclerotic heart disease of native coronary artery without angina pectoris: Secondary | ICD-10-CM

## 2021-03-28 DIAGNOSIS — I5031 Acute diastolic (congestive) heart failure: Secondary | ICD-10-CM

## 2021-03-28 DIAGNOSIS — E785 Hyperlipidemia, unspecified: Secondary | ICD-10-CM | POA: Diagnosis not present

## 2021-03-28 DIAGNOSIS — I10 Essential (primary) hypertension: Secondary | ICD-10-CM | POA: Diagnosis not present

## 2021-03-28 IMAGING — DX DG CHEST 2V
2 series · 2 of 2 positions shown · non-contrast
Comparison: 11/07/2003

CLINICAL DATA: Chest pain

EXAM:
CHEST - 2 VIEW

[w chest pa]
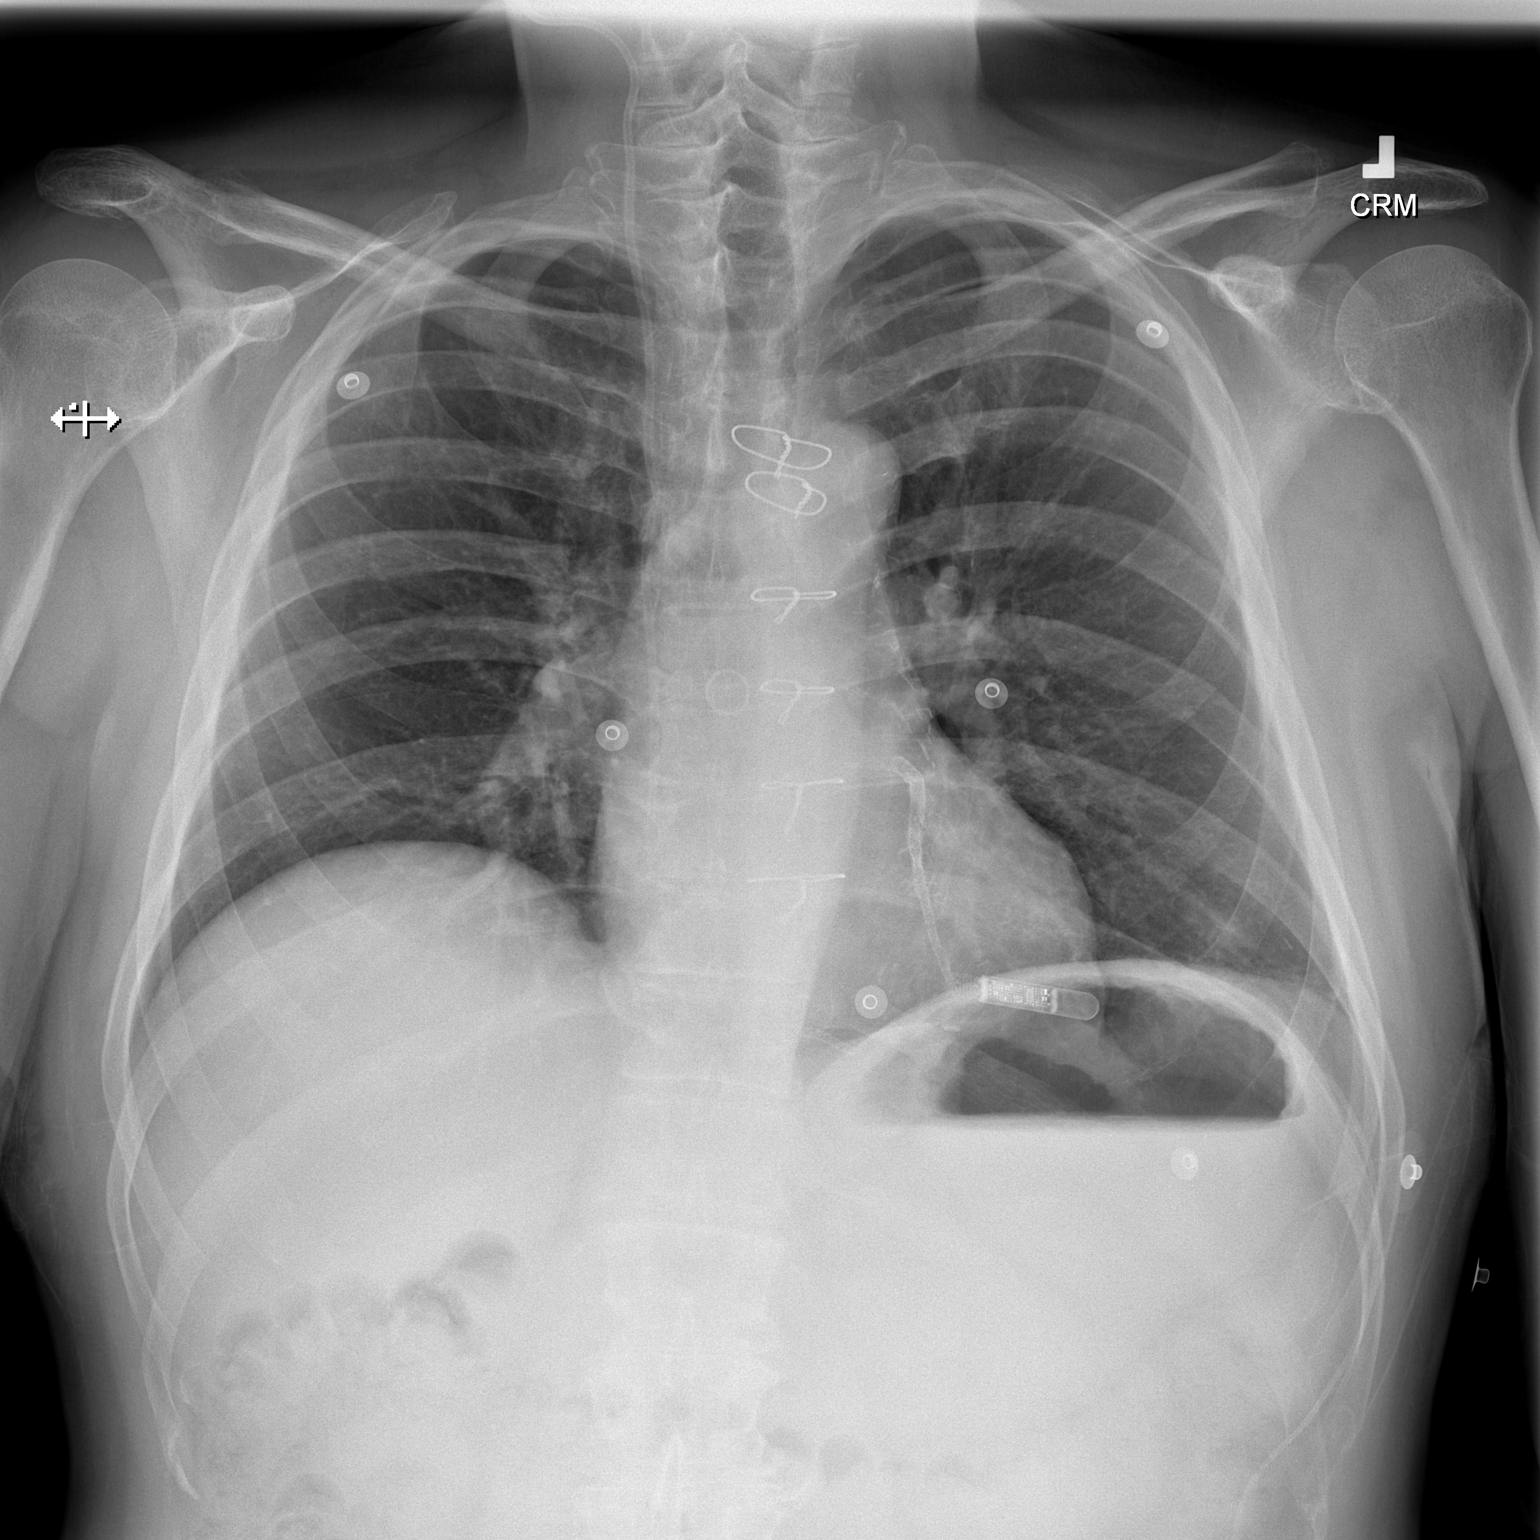

[w chest lat]
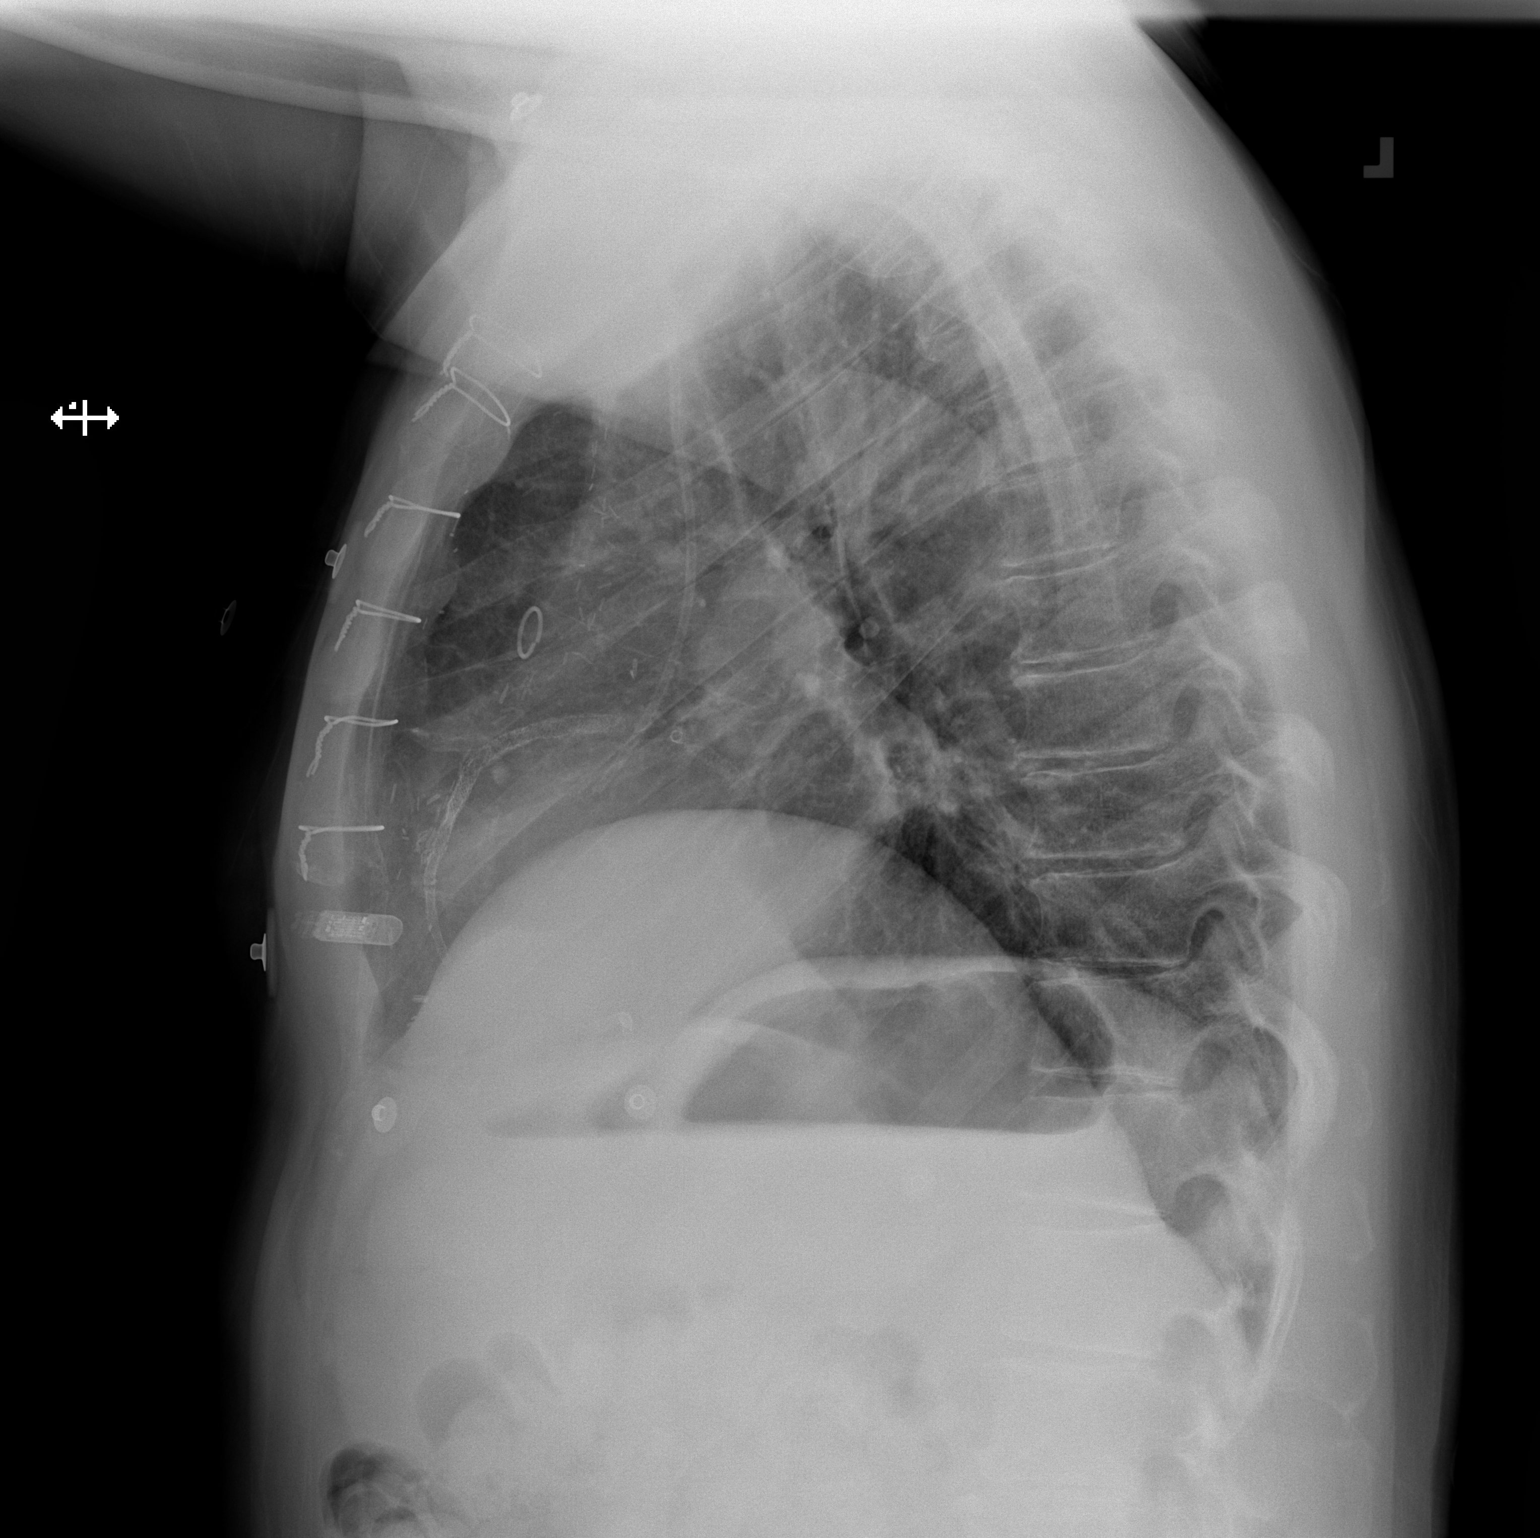

[2 of 2 positions shown; findings below may reference images not displayed]

FINDINGS: Post sternotomy changes. Right-sided central venous catheter with
tip projecting over proximal right atrium. Probable calcified
coronary graft. Normal heart size. No pneumothorax. Small electronic
device over the left lower chest.
IMPRESSION: 1. Right IJ central venous catheter tip projects over the right
atrium
2. No acute infiltrate or edema.

## 2021-03-28 MED ORDER — FUROSEMIDE 40 MG PO TABS: ORAL_TABLET | ORAL | 2 refills | Status: DC

## 2021-03-28 NOTE — Patient Instructions (Addendum)
Medication Instructions:  ?Your physician has recommended you make the following change in your medication:   ? ?CHANGE Furosemide (Lasix) to 80mg  (2 tablets) twice daily for 3 days ? ?We will call you Friday to check in on your weight ? ?*If you need a refill on your cardiac medications before your next appointment, please call your pharmacy* ? ?Lab Work: ?Your physician recommends that you return for lab work today: BNP, CMP, CBC ? ?If you have labs (blood work) drawn today and your tests are completely normal, you will receive your results only by: ?MyChart Message (if you have MyChart) OR ?A paper copy in the mail ?If you have any lab test that is abnormal or we need to change your treatment, we will call you to review the results. ? ?Testing/Procedures: ?Your EKG today shows normal sinus rhythm.  ? ?Follow-Up: ?At [facility], you and your health needs are our priority.  As part of our continuing mission to provide you with exceptional heart care, we have created designated Provider Care Teams.  These Care Teams include your primary Cardiologist (physician) and Advanced Practice Providers (APPs -  Physician Assistants and Nurse Practitioners) who all work together to provide you with the care you need, when you need it. ? ?We recommend signing up for the patient portal called "MyChart".  Sign up information is provided on this After Visit Summary.  MyChart is used to connect with patients for Virtual Visits (Telemedicine).  Patients are able to view lab/test results, encounter notes, upcoming appointments, etc.  Non-urgent messages can be sent to your provider as well.   ?To learn more about what you can do with MyChart, go to [facility].   ? ?Your next appointment:   ?As scheduled with [email or web address] in May  ? ?Other Instructions ? ?Recommend drinking less than 2 liters or 62 oz of fluids per day.  ?

## 2021-03-28 NOTE — Progress Notes (Signed)
? ?Office Visit  ?  ?Patient Name: Terry Nelson ?Date of Encounter: 03/28/2021 ? ?PCP:  Mila Palmer, PA-C ?  ?[place]  ?Cardiologist:  Sinclair Grooms, MD  ?Advanced Practice Provider:  No care team member to display ?Electrophysiologist:  None  ?   ?Chief Complaint  ?  ?Terry Nelson is a 66 y.o. male with a hx of CAD s/p CABG 2002 and subsequent PCI, PAF s/p ablation, DM2, HTN, HLD, CKDII, HFpEF, anemia presents today for follow up of CAD  ? ?Past Medical History  ?  ?Past Medical History:  ?Diagnosis Date  ? Anxiety state   ? Body mass index (bmi) 26.0-26.9, adult   ? CAD (coronary artery disease)   ? a. 2002 s/p CABG x 3 (LIMA->LAD, VG->OM1->OM2); b. s/p prior LAD and dRCA/RPDA stenting; c. 04/2019 PTCA & lithotripsy of ISR throughout LAD; d. 08/2020 PCI/scoring balloon PTCA throughout LAD 2/2 ISR; e. 09/2020 Cath: LM nl, LAD 40ost/p, 40p/d, 75d ISR (FFR 0.86), 50d, LCX small, OM1 90, OM2 25, RCA 20d ISR, RPDA 20 ISR, VG->OM1->OM2 ok, LIMA->LAD 174m-->Med rx.  ? Chronic heart failure with preserved ejection fraction (HFpEF) ([place])   ? a. 09/2020 Echo: Ef 60-65%, no rwma, nl RV size/fxn. Mod dil LA. triv MR. Mild AoV sclerosis w/o stenosis.  ? CKD (chronic kidney disease), stage II   ? DDD (degenerative disc disease), lumbar   ? DM (diabetes mellitus) ([place])   ? Gastritis   ? Headache   ? Hemorrhage   ? Hyperlipidemia   ? Hypertension   ? Kidney stones   ? Lumbago   ? Neck pain   ? Paroxysmal A-fib ([place])   ? a. CHA2DS2VASc = 4-->Eliquis; b. 06/2020 Zio: No afib. Up to 20 beats PSVT.  ? Pernicious anemia   ? Unstable angina ([facility])   ? ?Past Surgical History:  ?Procedure Laterality Date  ? ABLATION  2017  ? CARDIAC CATHETERIZATION    ? CORONARY ARTERY BYPASS GRAFT    ? CORONARY BALLOON ANGIOPLASTY N/A 04/27/2019  ? Procedure: CORONARY BALLOON ANGIOPLASTY;  Surgeon: Nelva Bush, MD;  Location: [facility];  Service: Cardiovascular;  Laterality: N/A;  ? CORONARY BALLOON  ANGIOPLASTY N/A 12/16/2019  ? Procedure: CORONARY BALLOON ANGIOPLASTY;  Surgeon: Belva Crome, MD;  Location: [facility];  Service: Cardiovascular;  Laterality: N/A;  ? CORONARY BALLOON ANGIOPLASTY N/A 09/06/2020  ? Procedure: CORONARY BALLOON ANGIOPLASTY;  Surgeon: Belva Crome, MD;  Location: [facility];  Service: Cardiovascular;  Laterality: N/A;  ? INTRAVASCULAR PRESSURE WIRE/FFR STUDY N/A 09/30/2020  ? Procedure: INTRAVASCULAR PRESSURE WIRE/FFR STUDY;  Surgeon: Early Osmond, MD;  Location: [facility];  Service: Cardiovascular;  Laterality: N/A;  ? INTRAVASCULAR ULTRASOUND/IVUS N/A 04/27/2019  ? Procedure: Intravascular Ultrasound/IVUS;  Surgeon: Nelva Bush, MD;  Location: [facility];  Service: Cardiovascular;  Laterality: N/A;  ? INTRAVASCULAR ULTRASOUND/IVUS N/A 12/16/2019  ? Procedure: Intravascular Ultrasound/IVUS;  Surgeon: Belva Crome, MD;  Location: [facility];  Service: Cardiovascular;  Laterality: N/A;  ? LEFT HEART CATH AND CORS/GRAFTS ANGIOGRAPHY N/A 04/09/2019  ? Procedure: LEFT HEART CATH AND CORS/GRAFTS ANGIOGRAPHY;  Surgeon: Belva Crome, MD;  Location: [facility];  Service: Cardiovascular;  Laterality: N/A;  ? LEFT HEART CATH AND CORS/GRAFTS ANGIOGRAPHY N/A 12/16/2019  ? Procedure: LEFT HEART CATH AND CORS/GRAFTS ANGIOGRAPHY;  Surgeon: Belva Crome, MD;  Location: [facility];  Service:  Cardiovascular;  Laterality: N/A;  ? LEFT HEART CATH AND CORS/GRAFTS ANGIOGRAPHY N/A 09/06/2020  ? Procedure: LEFT HEART CATH AND CORS/GRAFTS ANGIOGRAPHY;  Surgeon: Belva Crome, MD;  Location: [facility];  Service: Cardiovascular;  Laterality: N/A;  ? LEFT HEART CATH AND CORS/GRAFTS ANGIOGRAPHY N/A 09/30/2020  ? Procedure: LEFT HEART CATH AND CORS/GRAFTS ANGIOGRAPHY;  Surgeon: Early Osmond, MD;  Location: [facility];  Service: Cardiovascular;  Laterality: N/A;  ? ? ?Allergies ? ?Allergies  ?Allergen Reactions  ? Metoclopramide  Other (See Comments)  ?  DYSKINESIAS contractions of whole body  ? Penicillins Swelling and Rash  ?  Did it involve swelling of the face/tongue/throat, SOB, or low BP? Yes ?Did it involve sudden or severe rash/hives, skin peeling, or any reaction on the inside of your mouth or nose? Unknown ?Did you need to seek medical attention at a hospital or doctor's office? Yes ?When did it last happen?More than 40 years ago    ?If all above answers are ?NO?, may proceed with cephalosporin use. ?  ? ? ?History of Present Illness  ?  ?Terry Nelson is a 66 y.o. male with a hx of  CAD s/p CABG 2002 and subsequent PCI, PAF s/p ablation, DM2, HTN, HLD, CKDII, HFpEF, anemia last seen 02/13/21 by Nicholes Rough, PA. ? ?Admission 01/2021 for acute on chronic diastolic heart failure treated with -2.9L diuresis. Echo 01/2021 LVEF [id number], grade 3 diastolic dysfunction. Chest pain thought to be due to epicardial and microvascular angina and repeat cath deferred. He was discharged on Farxiga 10mg  QD, Lasix 40mg  QD. Eliquis discontinued as PAF was quiescent for several years and Hb drop from 12 to 10.5. Weight when leaving the hospital was 145 pounds. ? ?Seen 02/13/21 and Imdur was increased to 180mg  QD. His dry weight was 144 lbs. Lasix not increased due to creatinine. He did not tolerate increased dose and Imdur was reduced to 120mg . Weight 02/13/21 150 pounds and today weight 153 pounds. Tells me over the last 10 days his weight has been labile associated with exertional dyspnea. Weight as high as 156 at home. Tells me over the last few days has been taking 60 mg in the morning and 20mg  in the afternoon. Via phone was instructed to take Lasix 80mg  BID for 3 days but misunderstood and has been taking 80mg  (with split dosing 60mg  and 20mg  daily). Notes abdominal swelling a few days ago as well as lower extremity edema. Just started limiting fluids a few days ago with improvement. 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  ? ?EKGs/Labs/Other Studies Reviewed:  ? ?The following studies were reviewed today: ? ?Echo 02/02/21 ? ? 1. Left ventricular ejection fraction, by estimation, is 60 to 65%. The  ?left ventricle has normal function. The left ventricle has no regional  ?wall motion abnormalities. There is mild left ventricular hypertrophy of  ?the basal-septal segment. Left  ?ventricular diastolic parameters are consistent with Grade III diastolic  ?dysfunction (restrictive). Elevated left ventricular end-diastolic  ?pressure.  ? 2. Right ventricular systolic function is normal. The right ventricular  ?size is normal. Tricuspid regurgitation signal is inadequate for assessing  ?PA pressure.  ? 3. Left atrial size was severely dilated.  ? 4. The mitral valve is degenerative. Mild mitral valve regurgitation. No  ?evidence of mitral stenosis.  ? 5. The aortic valve is tricuspid. Aortic valve regurgitation is not  ?visualized. Aortic valve sclerosis is present,  with no evidence of aortic  ?valve stenosis. Aortic valve Vmax measures 1.12 m/s.  ? 6. The inferior vena cava is dilated in size with <50% respiratory  ?variability, suggesting right atrial pressure of 15 mmHg.  ? ?[place] 9/23/232 ? Relatively unchanged burden of disease with extensive stenting of the LAD; there is an area of the distal LAD with multiple layers of stent associated with a resting FFR of 0.86 (positive); resting FFR assessment proximal to this area was >0.90. ?Patent vein graft to OM. ?Mild disease of RCA. ?  ?RECOMMENDATION:  Given the recurrence of recoil in the distal LAD (with multiple layers of stent and prior treatment with Shockwave lithotripsy), consideration for either rotational or orbital atherectomy of this area will be discussed and considered.  For now, intensive medical therapy will be pursued. ? ?EKG:  EKG is ordered today.  The ekg ordered today demonstrates NSr 69 bpm  with non specific ST/T wave changes.  ? ?Recent Labs: ?09/30/2020: B  Natriuretic Peptide 98.2 ?02/01/2021: ALT 14; Magnesium 2.1 ?02/03/2021: BUN 19; Creatinine, Ser 1.48; Hemoglobin 10.1; Platelets 168; Potassium 3.6; Sodium 136  ?Recent Lipid Panel ?   ?Component Value Date/Time

## 2021-03-29 LAB — COMPREHENSIVE METABOLIC PANEL
ALT: 16 IU/L (ref 0–44)
AST: 22 IU/L (ref 0–40)
Albumin/Globulin Ratio: 1.9 (ref 1.2–2.2)
Albumin: 5 g/dL — ABNORMAL HIGH (ref 3.8–4.8)
Alkaline Phosphatase: 102 IU/L (ref 44–121)
BUN/Creatinine Ratio: 18 (ref 10–24)
BUN: 25 mg/dL (ref 8–27)
Bilirubin Total: 0.3 mg/dL (ref 0.0–1.2)
CO2: 27 mmol/L (ref 20–29)
Calcium: 10.2 mg/dL (ref 8.6–10.2)
Chloride: 96 mmol/L (ref 96–106)
Creatinine, Ser: 1.39 mg/dL — ABNORMAL HIGH (ref 0.76–1.27)
Globulin, Total: 2.6 g/dL (ref 1.5–4.5)
Glucose: 176 mg/dL — ABNORMAL HIGH (ref 70–99)
Potassium: 4.4 mmol/L (ref 3.5–5.2)
Sodium: 137 mmol/L (ref 134–144)
Total Protein: 7.6 g/dL (ref 6.0–8.5)
eGFR: 57 mL/min/{1.73_m2} — ABNORMAL LOW (ref >59)

## 2021-03-29 LAB — CBC
Hematocrit: 37.4 % — ABNORMAL LOW (ref 37.5–51.0)
Hemoglobin: 12.5 g/dL — ABNORMAL LOW (ref 13.0–17.7)
MCH: 31.3 pg (ref 26.6–33.0)
MCHC: 33.4 g/dL (ref 31.5–35.7)
MCV: 94 fL (ref 79–97)
Platelets: 182 10*3/uL (ref 150–450)
RBC: 4 x10E6/uL — ABNORMAL LOW (ref 4.14–5.80)
RDW: 12.9 % (ref 11.6–15.4)
WBC: 7.2 10*3/uL (ref 3.4–10.8)

## 2021-03-29 LAB — BRAIN NATRIURETIC PEPTIDE: BNP: 321.8 pg/mL — ABNORMAL HIGH (ref 0.0–100.0)

## 2021-03-30 ENCOUNTER — Encounter (HOSPITAL_BASED_OUTPATIENT_CLINIC_OR_DEPARTMENT_OTHER): Payer: Self-pay | Admitting: Family

## 2021-03-30 ENCOUNTER — Telehealth (HOSPITAL_BASED_OUTPATIENT_CLINIC_OR_DEPARTMENT_OTHER): Payer: Self-pay

## 2021-03-30 DIAGNOSIS — Z79899 Other long term (current) drug therapy: Secondary | ICD-10-CM

## 2021-03-30 DIAGNOSIS — I5031 Acute diastolic (congestive) heart failure: Secondary | ICD-10-CM

## 2021-03-30 NOTE — Telephone Encounter (Signed)
Not unexpected with increased diuresis. Potassium could be dropping with extra fluid pill. Recommend starting Potassium 39mEq daily with BNP, BMP Monday or Tuesday of next week.  ? ?Okay to go ahead and get his weight on his scale when you call.  ? ?Loel Dubonnet, NP   ?

## 2021-03-30 NOTE — Telephone Encounter (Signed)
Left message for patient to call back ? ? ? ? ? ?"Not unexpected with increased diuresis. Potassium could be dropping with extra fluid pill. Recommend starting Potassium 22mEq daily with BNP, BMP Monday or Tuesday of next week.  ?  ?Okay to go ahead and get his weight on his scale when you call.  ?  ?Loel Dubonnet, NP" ?

## 2021-03-30 NOTE — Telephone Encounter (Addendum)
Results called to patient who verbalizes understanding! Patient endorses that his weight is coming down, but he is having some leg camping at night.  ? ?Routing to [employer] to update ? ? ? ?----- Message from Loel Dubonnet, NP sent at 03/29/2021  2:01 PM EDT ----- ?CBC shows anemia is improving with no evidence of infection.  This is a good result.  Kidney function overall stable compared to previous.  Normal electrolytes, liver enzymes.  BNP does show volume overload.  Continue with plan as discussed in clinic to increase Lasix to 80 mg twice daily for 3 days.  We will call him Friday to check in on weight. ?

## 2021-03-31 MED ORDER — FUROSEMIDE 40 MG PO TABS: ORAL_TABLET | ORAL | 2 refills | Status: DC

## 2021-03-31 MED ORDER — POTASSIUM CHLORIDE CRYS ER 10 MEQ PO TBCR: 10.0000 meq | EXTENDED_RELEASE_TABLET | Freq: Every day | ORAL | 1 refills | Status: DC

## 2021-03-31 NOTE — Telephone Encounter (Signed)
Called Terry Nelson. Notes weight today down to 137 lbs. Has had some headache and fatigue. Following recommendations were provided: ? ?If weight <140 pounds, do not take Furosemide (Lasix). ?If weight >140 pounds, take Furosemide 40mg  (1 tablet). ?If weight gain of 2 lbs overnight or 5 lbs in one week, take Furosemide 80mg  (2 tablets) ? ?BMP, BNP on Monday or Tuesday at [place] office. Provided office address. ? ?Monday, NP  ?

## 2021-03-31 NOTE — Addendum Note (Signed)
Addended by: Loel Dubonnet on: 03/31/2021 05:40 PM ? ? Modules accepted: Orders ? ?

## 2021-03-31 NOTE — Telephone Encounter (Signed)
-  Pt updated and verbalized understanding ?-New orders placed ? ? ?Updated weights: ?3/23-146 lbs ?3/24-143 lbs ? ? ?

## 2021-03-31 NOTE — Telephone Encounter (Signed)
Pt returning Terry Nelson- RN phone call ?

## 2021-03-31 NOTE — Telephone Encounter (Signed)
Left message for patient to call back ? ? ? ? ? ?"Not unexpected with increased diuresis. Potassium could be dropping with extra fluid pill. Recommend starting Potassium 10mEq daily with BNP, BMP Monday or Tuesday of next week.  ?  ?Okay to go ahead and get his weight on his scale when you call.  ?  ?Caitlin S Walker, NP" ?

## 2021-03-31 NOTE — Addendum Note (Signed)
Addended by: Parke Poisson on: 03/31/2021 12:25 PM ? ? Modules accepted: Orders ? ?

## 2021-04-03 ENCOUNTER — Other Ambulatory Visit (HOSPITAL_BASED_OUTPATIENT_CLINIC_OR_DEPARTMENT_OTHER): Payer: Self-pay

## 2021-04-03 ENCOUNTER — Telehealth: Payer: Self-pay | Admitting: Interventional Cardiology

## 2021-04-03 DIAGNOSIS — Z79899 Other long term (current) drug therapy: Secondary | ICD-10-CM

## 2021-04-03 NOTE — Telephone Encounter (Signed)
RN returned call to patient to see where he tried to get labs done at. He states that he went to the [place] office, but was told they did not have any lab orders for him ? ? ?Risk manager office to troubleshoot. Will send the patient back to the [place] office now that problem is resolved.  ?

## 2021-04-03 NOTE — Telephone Encounter (Signed)
Patient states he tried to get lab work done in [place] but they did not see any orders.  ?

## 2021-04-24 ENCOUNTER — Telehealth: Payer: Self-pay | Admitting: Interventional Cardiology

## 2021-04-24 MED ORDER — AMLODIPINE BESYLATE 10 MG PO TABS: 10.0000 mg | ORAL_TABLET | Freq: Every day | ORAL | 3 refills | Status: DC

## 2021-04-24 NOTE — Telephone Encounter (Signed)
?*  STAT* If patient is at the pharmacy, call can be transferred to refill team. ? ? ?1. Which medications need to be refilled? (please list name of each medication and dose if known) amLODipine (NORVASC) 10 MG tablet ? ?2. Which pharmacy/location (including street and city if local pharmacy) is medication to be sent to? [facility] - [place], [place] - [address] ? ?3. Do they need a 30 day or 90 day supply? 90  ?

## 2021-04-24 NOTE — Telephone Encounter (Signed)
Refill for Amlodipine sent to [facility], per pt's request.  ?

## 2021-05-22 NOTE — H&P (View-Only) (Signed)
Cardiology Office Note:    Date:  05/24/2021   ID:  Terry Nelson, DOB 02-03-1955, MRN [id number]  PCP:  Mila Palmer, PA-C  Cardiologist:  Sinclair Grooms, MD   Referring MD: Mila Palmer, PA-C   Chief Complaint  Patient presents with   Coronary Artery Disease    Angina pectoris   Follow-up   Congestive Heart Failure    Diastolic    History of Present Illness:    Terry Nelson is a 66 y.o. male with a hx of  CABG in 2002 by Dr. Roxan Hockey, overlapping proximal to distal LAD stents with ISR, multiple subsequent balloon angioplasty procedures, PAF with prior ablation, HtN, DM II, HLD, and CKD.  Increasingly frequent reintervention over the past 12 months with angioplasty, scoring balloons, and lithotripsy but with ongoing symptoms.  High risk orbital atherectomy/rotational atherectomy for poorly expanded previously placed 2 layer stent offered but patient has not yet decided to move forward.    Having near refractory angina.  Awakens each morning with chest pressure.  As the day goes along it turns into a "hurting" that radiates into the armpit and improves somewhat with sublingual nitroglycerin.  Walking in today from the parking lot he experienced a worsening of the discomfort that is now easing down with rest.  This has been getting progressively worse causing increased limitation in physical activity.  Having difficulty ambulating because of chronic back discomfort.  The current medication regimen includes amlodipine, isosorbide mononitrate 120 mg daily, Toprol-XL 200 mg/day, as needed sublingual nitroglycerin.  Past Medical History:  Diagnosis Date   Anxiety state    Body mass index (bmi) 26.0-26.9, adult    CAD (coronary artery disease)    a. 2002 s/p CABG x 3 (LIMA->LAD, VG->OM1->OM2); b. s/p prior LAD and dRCA/RPDA stenting; c. 04/2019 PTCA & lithotripsy of ISR throughout LAD; d. 08/2020 PCI/scoring balloon PTCA throughout LAD 2/2 ISR; e. 09/2020 Cath: LM nl, LAD  40ost/p, 40p/d, 75d ISR (FFR 0.86), 50d, LCX small, OM1 90, OM2 25, RCA 20d ISR, RPDA 20 ISR, VG->OM1->OM2 ok, LIMA->LAD 180m->Med rx.   Chronic heart failure with preserved ejection fraction (HFpEF) ([facility]    a. 09/2020 Echo: Ef 60-65%, no rwma, nl RV size/fxn. Mod dil LA. triv MR. Mild AoV sclerosis w/o stenosis.   CKD (chronic kidney disease), stage II    DDD (degenerative disc disease), lumbar    DM (diabetes mellitus) ([facility]    Gastritis    Headache    Hemorrhage    Hyperlipidemia    Hypertension    Kidney stones    Lumbago    Neck pain    Paroxysmal A-fib ([facility]    a. CHA2DS2VASc = 4-->Eliquis; b. 06/2020 Zio: No afib. Up to 20 beats PSVT.   Pernicious anemia    Unstable angina (HCC)     Past Surgical History:  Procedure Laterality Date   ABLATION  2017   CARDIAC CATHETERIZATION     CORONARY ARTERY BYPASS GRAFT     CORONARY BALLOON ANGIOPLASTY N/A 04/27/2019   Procedure: CORONARY BALLOON ANGIOPLASTY;  Surgeon: ENelva Bush MD;  [facility];  Service: Cardiovascular;  Laterality: N/A;   CORONARY BALLOON ANGIOPLASTY N/A 12/16/2019   Procedure: CORONARY BALLOON ANGIOPLASTY;  Surgeon: SBelva Crome MD;  [facility];  Service: Cardiovascular;  Laterality: N/A;   CORONARY BALLOON ANGIOPLASTY N/A 09/06/2020   Procedure: CORONARY BALLOON ANGIOPLASTY;  Surgeon: SBelva Crome MD;  [facility];  Service: Cardiovascular;  Laterality: N/A;   INTRAVASCULAR PRESSURE WIRE/FFR STUDY N/A 09/30/2020   Procedure: INTRAVASCULAR PRESSURE WIRE/FFR STUDY;  Surgeon: Early Osmond, MD;  Location: [facility];  Service: Cardiovascular;  Laterality: N/A;   INTRAVASCULAR ULTRASOUND/IVUS N/A 04/27/2019   Procedure: Intravascular Ultrasound/IVUS;  Surgeon: Nelva Bush, MD;  Location: [facility];  Service: Cardiovascular;  Laterality: N/A;   INTRAVASCULAR ULTRASOUND/IVUS N/A 12/16/2019   Procedure: Intravascular Ultrasound/IVUS;   Surgeon: Belva Crome, MD;  Location: [facility];  Service: Cardiovascular;  Laterality: N/A;   LEFT HEART CATH AND CORS/GRAFTS ANGIOGRAPHY N/A 04/09/2019   Procedure: LEFT HEART CATH AND CORS/GRAFTS ANGIOGRAPHY;  Surgeon: Belva Crome, MD;  Location: [facility];  Service: Cardiovascular;  Laterality: N/A;   LEFT HEART CATH AND CORS/GRAFTS ANGIOGRAPHY N/A 12/16/2019   Procedure: LEFT HEART CATH AND CORS/GRAFTS ANGIOGRAPHY;  Surgeon: Belva Crome, MD;  Location: [facility];  Service: Cardiovascular;  Laterality: N/A;   LEFT HEART CATH AND CORS/GRAFTS ANGIOGRAPHY N/A 09/06/2020   Procedure: LEFT HEART CATH AND CORS/GRAFTS ANGIOGRAPHY;  Surgeon: Belva Crome, MD;  Location: [facility];  Service: Cardiovascular;  Laterality: N/A;   LEFT HEART CATH AND CORS/GRAFTS ANGIOGRAPHY N/A 09/30/2020   Procedure: LEFT HEART CATH AND CORS/GRAFTS ANGIOGRAPHY;  Surgeon: Early Osmond, MD;  Location: [facility];  Service: Cardiovascular;  Laterality: N/A;    Current Medications: Current Meds  Medication Sig   amLODipine (NORVASC) 10 MG tablet Take 1 tablet (10 mg total) by mouth daily.   aspirin 81 MG EC tablet Take 1 tablet (81 mg total) by mouth daily. Swallow whole.   atorvastatin (LIPITOR) 80 MG tablet Take 80 mg by mouth at bedtime.   Blood Glucose Monitoring Suppl (CONTOUR NEXT MONITOR) w/Device KIT daily.   dapagliflozin propanediol (FARXIGA) 10 MG TABS tablet Take 1 tablet (10 mg total) by mouth daily.   furosemide (LASIX) 40 MG tablet Take 109m (1 tablet) daily. Take additional 466mas needed for weight gain of 2 pounds overnight or 5 pounds in one week.   gabapentin (NEURONTIN) 300 MG capsule Take 1,500 mg by mouth in the morning and at bedtime. Pt. Takes 1500 mg twice a day.   glucose blood (CONTOUR NEXT TEST) test strip by Does not apply route.   Insulin Pen Needle (BD PEN NEEDLE NANO U/F) 32G X 4 MM MISC USE ONCE AS DIRECTED   isosorbide mononitrate  (IMDUR) 60 MG 24 hr tablet Take 2 tablets (120 mg total) by mouth daily.   magnesium oxide (MAG-OX) 400 MG tablet Take 800 mg by mouth daily.   metoprolol (TOPROL XL) 200 MG 24 hr tablet Take 1 tablet (200 mg total) by mouth daily.   nitroGLYCERIN (NITROSTAT) 0.4 MG SL tablet Place 1 tablet (0.4 mg total) under the tongue every 5 (five) minutes as needed for chest pain.   oxyCODONE-acetaminophen (PERCOCET) 10-325 MG tablet Take 1 tablet by mouth every 4 (four) hours as needed for pain.   pantoprazole (PROTONIX) 40 MG tablet Take 40 mg by mouth daily.   Polyethylene Glycol 400 (BLINK TEARS) 0.25 % SOLN Place 1 drop into both eyes daily as needed (dry eyes).   potassium chloride (KLOR-CON M) 10 MEQ tablet Take 1 tablet (10 mEq total) by mouth daily.   prasugrel (EFFIENT) 10 MG TABS tablet Take 10 mg by mouth daily.   promethazine (PHENERGAN) 25 MG tablet Take 25 mg by mouth 2 (two) times  daily as needed for nausea or vomiting.   ranolazine (RANEXA) 500 MG 12 hr tablet Take 1 tablet (500 mg total) by mouth 2 (two) times daily.   spironolactone (ALDACTONE) 25 MG tablet Take 25 mg by mouth daily.   tiZANidine (ZANAFLEX) 4 MG tablet Take 4-8 mg by mouth See admin instructions. Take 4 mg by mouth in the morning, afternoon and dinner and 8 mg bedtime   TOUJEO SOLOSTAR 300 UNIT/ML Solostar Pen Inject 32 Units into the skin daily.   traZODone (DESYREL) 50 MG tablet Take 50 mg by mouth at bedtime as needed for sleep.     Allergies:   Metoclopramide and Penicillins   Social History   Socioeconomic History   Marital status: Married    Spouse name: Not on file   Number of children: Not on file   Years of education: Not on file   Highest education level: Not on file  Occupational History   Not on file  Tobacco Use   Smoking status: Never   Smokeless tobacco: Former    Types: Chew   Tobacco comments:    per pt chewed tobacco in high school   Vaping Use   Vaping Use: Never used  Substance and  Sexual Activity   Alcohol use: Never   Drug use: Never   Sexual activity: Not on file    Comment: MARRIED  Other Topics Concern   Not on file  Social History Narrative   Not on file   Social Determinants of Health   Financial Resource Strain: Not on file  Food Insecurity: Not on file  Transportation Needs: Not on file  Physical Activity: Not on file  Stress: Not on file  Social Connections: Not on file     Family History: The patient's family history includes Diabetes in his father; Heart disease in an other family member; High blood pressure in his mother; Stroke in his mother.  ROS:   Please see the history of present illness.    Dyspnea and lower extremity swelling improved on Augmentin Lasix 80 mg twice daily for 2 days then 40 mg/day thereafter.  Dyspnea on exertion have resolved.  Orthopnea has resolved.  Wilder Glade was also started because of diastolic heart failure.  Breathing is doing relatively well.  All other systems reviewed and are negative.  EKGs/Labs/Other Studies Reviewed:    The following studies were reviewed today: No new or recent imaging  EKG:  EKG normal sinus rhythm, borderline short PR interval, small inferior Q waves, nonspecific T wave flattening, and when compared to EKG performed 03/28/2021, ECG is unchanged.  Recent Labs: 02/01/2021: Magnesium 2.1 03/28/2021: ALT 16; BNP 321.8; BUN 25; Creatinine, Ser 1.39; Hemoglobin 12.5; Platelets 182; Potassium 4.4; Sodium 137  Recent Lipid Panel    Component Value Date/Time   CHOL 106 02/02/2021 0358   CHOL 136 03/30/2019 1234   TRIG 69 02/02/2021 0358   HDL 45 02/02/2021 0358   HDL 36 (L) 03/30/2019 1234   CHOLHDL 2.4 02/02/2021 0358   VLDL 14 02/02/2021 0358   LDLCALC 47 02/02/2021 0358   LDLCALC 70 03/30/2019 1234    Physical Exam:    VS:  BP 108/60   Pulse 64   Ht _0  (1.702 m)   Wt 145 lb 6.4 oz (66 kg)   SpO2 97%   BMI 22.77 kg/m     Wt Readings from Last 3 Encounters:  05/24/21 145 lb  6.4 oz (66 kg)  03/28/21 153 lb 3.2 oz (  69.5 kg)  02/13/21 150 lb 9.6 oz (68.3 kg)     GEN: Slender.  Weight is down 5 to 8 pounds.. No acute distress HEENT: Normal NECK: No JVD. LYMPHATICS: No lymphadenopathy CARDIAC: No murmur. RRR S4 but no S3 gallop, or edema. VASCULAR:  Normal Pulses. No bruits. RESPIRATORY:  Clear to auscultation without rales, wheezing or rhonchi  ABDOMEN: Soft, non-tender, non-distended, No pulsatile mass, MUSCULOSKELETAL: No deformity  SKIN: Warm and dry NEUROLOGIC:  Alert and oriented x 3 PSYCHIATRIC:  Normal affect   ASSESSMENT:    1. Coronary artery disease involving native coronary artery of native heart with angina pectoris ([place])   2. Chronic heart failure with preserved ejection fraction ([place])   3. Essential hypertension   4. Hyperlipidemia LDL goal <70   5. PAF (paroxysmal atrial fibrillation) ([place])   6. Controlled type 2 diabetes mellitus with other circulatory complication, without long-term current use of insulin ([place])   7. Chronic anticoagulation   8. Primary hypertension    PLAN:    In order of problems listed above:  Class III-IV angina.  Problem is chronic diffuse in-stent restenosis of full metal jacket stents with 2 layers in many spots in the proximal to distal LAD.  We will plan relook cardiac catheterization, redilatation if evidence of significant obstruction.  We have contemplated that in the past orbital atherectomy within the stented segment.  This would be off label use.  Doubt we would consider that particular therapy but we will need to see what the anatomy looks like. Volume status is stable. Blood pressure is controlled. Continue aggressive lipid-lowering therapy with atorvastatin 80 mg/day. No clinical recurrence Continue Farxiga and diet. Continue Effient and aspirin.    The patient was counseled to undergo left heart catheterization, coronary angiography, and possible percutaneous coronary intervention with stent  implantation. The procedural risks and benefits were discussed in detail. The risks discussed included death, stroke, myocardial infarction, life-threatening bleeding, limb ischemia, kidney injury, allergy, and possible emergency cardiac surgery. The risk of these significant complications were estimated to occur less than 1% of the time. After discussion, the patient has agreed to proceed.    Medication Adjustments/Labs and Tests Ordered: Current medicines are reviewed at length with the patient today.  Concerns regarding medicines are outlined above.  No orders of the defined types were placed in this encounter.  No orders of the defined types were placed in this encounter.   There are no Patient Instructions on file for this visit.   Signed, Sinclair Grooms, MD  05/24/2021 1:42 PM    [facility]

## 2021-05-22 NOTE — Progress Notes (Signed)
Cardiology Office Note:    Date:  05/24/2021   ID:  CLEO VILLAMIZAR, DOB 02-03-1955, MRN [id number]  PCP:  Mila Palmer, PA-C  Cardiologist:  Sinclair Grooms, MD   Referring MD: Mila Palmer, PA-C   Chief Complaint  Patient presents with   Coronary Artery Disease    Angina pectoris   Follow-up   Congestive Heart Failure    Diastolic    History of Present Illness:    Terry Nelson is a 66 y.o. male with a hx of  CABG in 2002 by Dr. Roxan Hockey, overlapping proximal to distal LAD stents with ISR, multiple subsequent balloon angioplasty procedures, PAF with prior ablation, HtN, DM II, HLD, and CKD.  Increasingly frequent reintervention over the past 12 months with angioplasty, scoring balloons, and lithotripsy but with ongoing symptoms.  High risk orbital atherectomy/rotational atherectomy for poorly expanded previously placed 2 layer stent offered but patient has not yet decided to move forward.    Having near refractory angina.  Awakens each morning with chest pressure.  As the day goes along it turns into a "hurting" that radiates into the armpit and improves somewhat with sublingual nitroglycerin.  Walking in today from the parking lot he experienced a worsening of the discomfort that is now easing down with rest.  This has been getting progressively worse causing increased limitation in physical activity.  Having difficulty ambulating because of chronic back discomfort.  The current medication regimen includes amlodipine, isosorbide mononitrate 120 mg daily, Toprol-XL 200 mg/day, as needed sublingual nitroglycerin.  Past Medical History:  Diagnosis Date   Anxiety state    Body mass index (bmi) 26.0-26.9, adult    CAD (coronary artery disease)    a. 2002 s/p CABG x 3 (LIMA->LAD, VG->OM1->OM2); b. s/p prior LAD and dRCA/RPDA stenting; c. 04/2019 PTCA & lithotripsy of ISR throughout LAD; d. 08/2020 PCI/scoring balloon PTCA throughout LAD 2/2 ISR; e. 09/2020 Cath: LM nl, LAD  40ost/p, 40p/d, 75d ISR (FFR 0.86), 50d, LCX small, OM1 90, OM2 25, RCA 20d ISR, RPDA 20 ISR, VG->OM1->OM2 ok, LIMA->LAD 180m->Med rx.   Chronic heart failure with preserved ejection fraction (HFpEF) ([facility]    a. 09/2020 Echo: Ef 60-65%, no rwma, nl RV size/fxn. Mod dil LA. triv MR. Mild AoV sclerosis w/o stenosis.   CKD (chronic kidney disease), stage II    DDD (degenerative disc disease), lumbar    DM (diabetes mellitus) ([facility]    Gastritis    Headache    Hemorrhage    Hyperlipidemia    Hypertension    Kidney stones    Lumbago    Neck pain    Paroxysmal A-fib ([facility]    a. CHA2DS2VASc = 4-->Eliquis; b. 06/2020 Zio: No afib. Up to 20 beats PSVT.   Pernicious anemia    Unstable angina (HCC)     Past Surgical History:  Procedure Laterality Date   ABLATION  2017   CARDIAC CATHETERIZATION     CORONARY ARTERY BYPASS GRAFT     CORONARY BALLOON ANGIOPLASTY N/A 04/27/2019   Procedure: CORONARY BALLOON ANGIOPLASTY;  Surgeon: ENelva Bush MD;  [facility];  Service: Cardiovascular;  Laterality: N/A;   CORONARY BALLOON ANGIOPLASTY N/A 12/16/2019   Procedure: CORONARY BALLOON ANGIOPLASTY;  Surgeon: SBelva Crome MD;  [facility];  Service: Cardiovascular;  Laterality: N/A;   CORONARY BALLOON ANGIOPLASTY N/A 09/06/2020   Procedure: CORONARY BALLOON ANGIOPLASTY;  Surgeon: SBelva Crome MD;  [facility];  Service: Cardiovascular;  Laterality: N/A;   INTRAVASCULAR PRESSURE WIRE/FFR STUDY N/A 09/30/2020   Procedure: INTRAVASCULAR PRESSURE WIRE/FFR STUDY;  Surgeon: Early Osmond, MD;  Location: [facility];  Service: Cardiovascular;  Laterality: N/A;   INTRAVASCULAR ULTRASOUND/IVUS N/A 04/27/2019   Procedure: Intravascular Ultrasound/IVUS;  Surgeon: Nelva Bush, MD;  Location: [place];  Service: Cardiovascular;  Laterality: N/A;   INTRAVASCULAR ULTRASOUND/IVUS N/A 12/16/2019   Procedure: Intravascular Ultrasound/IVUS;   Surgeon: Belva Crome, MD;  Location: [facility];  Service: Cardiovascular;  Laterality: N/A;   LEFT HEART CATH AND CORS/GRAFTS ANGIOGRAPHY N/A 04/09/2019   Procedure: LEFT HEART CATH AND CORS/GRAFTS ANGIOGRAPHY;  Surgeon: Belva Crome, MD;  Location: [facility];  Service: Cardiovascular;  Laterality: N/A;   LEFT HEART CATH AND CORS/GRAFTS ANGIOGRAPHY N/A 12/16/2019   Procedure: LEFT HEART CATH AND CORS/GRAFTS ANGIOGRAPHY;  Surgeon: Belva Crome, MD;  Location: [facility];  Service: Cardiovascular;  Laterality: N/A;   LEFT HEART CATH AND CORS/GRAFTS ANGIOGRAPHY N/A 09/06/2020   Procedure: LEFT HEART CATH AND CORS/GRAFTS ANGIOGRAPHY;  Surgeon: Belva Crome, MD;  Location: [facility];  Service: Cardiovascular;  Laterality: N/A;   LEFT HEART CATH AND CORS/GRAFTS ANGIOGRAPHY N/A 09/30/2020   Procedure: LEFT HEART CATH AND CORS/GRAFTS ANGIOGRAPHY;  Surgeon: Early Osmond, MD;  Location: [facility];  Service: Cardiovascular;  Laterality: N/A;    Current Medications: Current Meds  Medication Sig   amLODipine (NORVASC) 10 MG tablet Take 1 tablet (10 mg total) by mouth daily.   aspirin 81 MG EC tablet Take 1 tablet (81 mg total) by mouth daily. Swallow whole.   atorvastatin (LIPITOR) 80 MG tablet Take 80 mg by mouth at bedtime.   Blood Glucose Monitoring Suppl (CONTOUR NEXT MONITOR) w/Device KIT daily.   dapagliflozin propanediol (FARXIGA) 10 MG TABS tablet Take 1 tablet (10 mg total) by mouth daily.   furosemide (LASIX) 40 MG tablet Take 109m (1 tablet) daily. Take additional 466mas needed for weight gain of 2 pounds overnight or 5 pounds in one week.   gabapentin (NEURONTIN) 300 MG capsule Take 1,500 mg by mouth in the morning and at bedtime. Pt. Takes 1500 mg twice a day.   glucose blood (CONTOUR NEXT TEST) test strip by Does not apply route.   Insulin Pen Needle (BD PEN NEEDLE NANO U/F) 32G X 4 MM MISC USE ONCE AS DIRECTED   isosorbide mononitrate  (IMDUR) 60 MG 24 hr tablet Take 2 tablets (120 mg total) by mouth daily.   magnesium oxide (MAG-OX) 400 MG tablet Take 800 mg by mouth daily.   metoprolol (TOPROL XL) 200 MG 24 hr tablet Take 1 tablet (200 mg total) by mouth daily.   nitroGLYCERIN (NITROSTAT) 0.4 MG SL tablet Place 1 tablet (0.4 mg total) under the tongue every 5 (five) minutes as needed for chest pain.   oxyCODONE-acetaminophen (PERCOCET) 10-325 MG tablet Take 1 tablet by mouth every 4 (four) hours as needed for pain.   pantoprazole (PROTONIX) 40 MG tablet Take 40 mg by mouth daily.   Polyethylene Glycol 400 (BLINK TEARS) 0.25 % SOLN Place 1 drop into both eyes daily as needed (dry eyes).   potassium chloride (KLOR-CON M) 10 MEQ tablet Take 1 tablet (10 mEq total) by mouth daily.   prasugrel (EFFIENT) 10 MG TABS tablet Take 10 mg by mouth daily.   promethazine (PHENERGAN) 25 MG tablet Take 25 mg by mouth 2 (two) times  daily as needed for nausea or vomiting.   ranolazine (RANEXA) 500 MG 12 hr tablet Take 1 tablet (500 mg total) by mouth 2 (two) times daily.   spironolactone (ALDACTONE) 25 MG tablet Take 25 mg by mouth daily.   tiZANidine (ZANAFLEX) 4 MG tablet Take 4-8 mg by mouth See admin instructions. Take 4 mg by mouth in the morning, afternoon and dinner and 8 mg bedtime   TOUJEO SOLOSTAR 300 UNIT/ML Solostar Pen Inject 32 Units into the skin daily.   traZODone (DESYREL) 50 MG tablet Take 50 mg by mouth at bedtime as needed for sleep.     Allergies:   Metoclopramide and Penicillins   Social History   Socioeconomic History   Marital status: Married    Spouse name: Not on file   Number of children: Not on file   Years of education: Not on file   Highest education level: Not on file  Occupational History   Not on file  Tobacco Use   Smoking status: Never   Smokeless tobacco: Former    Types: Chew   Tobacco comments:    per pt chewed tobacco in high school   Vaping Use   Vaping Use: Never used  Substance and  Sexual Activity   Alcohol use: Never   Drug use: Never   Sexual activity: Not on file    Comment: MARRIED  Other Topics Concern   Not on file  Social History Narrative   Not on file   Social Determinants of Health   Financial Resource Strain: Not on file  Food Insecurity: Not on file  Transportation Needs: Not on file  Physical Activity: Not on file  Stress: Not on file  Social Connections: Not on file     Family History: The patient's family history includes Diabetes in his father; Heart disease in an other family member; High blood pressure in his mother; Stroke in his mother.  ROS:   Please see the history of present illness.    Dyspnea and lower extremity swelling improved on Augmentin Lasix 80 mg twice daily for 2 days then 40 mg/day thereafter.  Dyspnea on exertion have resolved.  Orthopnea has resolved.  Wilder Glade was also started because of diastolic heart failure.  Breathing is doing relatively well.  All other systems reviewed and are negative.  EKGs/Labs/Other Studies Reviewed:    The following studies were reviewed today: No new or recent imaging  EKG:  EKG normal sinus rhythm, borderline short PR interval, small inferior Q waves, nonspecific T wave flattening, and when compared to EKG performed 03/28/2021, ECG is unchanged.  Recent Labs: 02/01/2021: Magnesium 2.1 03/28/2021: ALT 16; BNP 321.8; BUN 25; Creatinine, Ser 1.39; Hemoglobin 12.5; Platelets 182; Potassium 4.4; Sodium 137  Recent Lipid Panel    Component Value Date/Time   CHOL 106 02/02/2021 0358   CHOL 136 03/30/2019 1234   TRIG 69 02/02/2021 0358   HDL 45 02/02/2021 0358   HDL 36 (L) 03/30/2019 1234   CHOLHDL 2.4 02/02/2021 0358   VLDL 14 02/02/2021 0358   LDLCALC 47 02/02/2021 0358   LDLCALC 70 03/30/2019 1234    Physical Exam:    VS:  BP 108/60   Pulse 64   Ht _0  (1.702 m)   Wt 145 lb 6.4 oz (66 kg)   SpO2 97%   BMI 22.77 kg/m     Wt Readings from Last 3 Encounters:  05/24/21 145 lb  6.4 oz (66 kg)  03/28/21 153 lb 3.2 oz (  69.5 kg)  02/13/21 150 lb 9.6 oz (68.3 kg)     GEN: Slender.  Weight is down 5 to 8 pounds.. No acute distress HEENT: Normal NECK: No JVD. LYMPHATICS: No lymphadenopathy CARDIAC: No murmur. RRR S4 but no S3 gallop, or edema. VASCULAR:  Normal Pulses. No bruits. RESPIRATORY:  Clear to auscultation without rales, wheezing or rhonchi  ABDOMEN: Soft, non-tender, non-distended, No pulsatile mass, MUSCULOSKELETAL: No deformity  SKIN: Warm and dry NEUROLOGIC:  Alert and oriented x 3 PSYCHIATRIC:  Normal affect   ASSESSMENT:    1. Coronary artery disease involving native coronary artery of native heart with angina pectoris ([place])   2. Chronic heart failure with preserved ejection fraction ([place])   3. Essential hypertension   4. Hyperlipidemia LDL goal <70   5. PAF (paroxysmal atrial fibrillation) ([place])   6. Controlled type 2 diabetes mellitus with other circulatory complication, without long-term current use of insulin ([place])   7. Chronic anticoagulation   8. Primary hypertension    PLAN:    In order of problems listed above:  Class III-IV angina.  Problem is chronic diffuse in-stent restenosis of full metal jacket stents with 2 layers in many spots in the proximal to distal LAD.  We will plan relook cardiac catheterization, redilatation if evidence of significant obstruction.  We have contemplated that in the past orbital atherectomy within the stented segment.  This would be off label use.  Doubt we would consider that particular therapy but we will need to see what the anatomy looks like. Volume status is stable. Blood pressure is controlled. Continue aggressive lipid-lowering therapy with atorvastatin 80 mg/day. No clinical recurrence Continue Farxiga and diet. Continue Effient and aspirin.    The patient was counseled to undergo left heart catheterization, coronary angiography, and possible percutaneous coronary intervention with stent  implantation. The procedural risks and benefits were discussed in detail. The risks discussed included death, stroke, myocardial infarction, life-threatening bleeding, limb ischemia, kidney injury, allergy, and possible emergency cardiac surgery. The risk of these significant complications were estimated to occur less than 1% of the time. After discussion, the patient has agreed to proceed.    Medication Adjustments/Labs and Tests Ordered: Current medicines are reviewed at length with the patient today.  Concerns regarding medicines are outlined above.  No orders of the defined types were placed in this encounter.  No orders of the defined types were placed in this encounter.   There are no Patient Instructions on file for this visit.   Signed, Sinclair Grooms, MD  05/24/2021 1:42 PM    [facility]

## 2021-05-24 ENCOUNTER — Encounter: Payer: Self-pay | Admitting: Interventional Cardiology

## 2021-05-24 ENCOUNTER — Ambulatory Visit (INDEPENDENT_AMBULATORY_CARE_PROVIDER_SITE_OTHER): Payer: BC Managed Care – PPO | Admitting: Interventional Cardiology

## 2021-05-24 VITALS — BP 108/60 | HR 64 | Ht 67.0 in | Wt 145.4 lb

## 2021-05-24 DIAGNOSIS — Z7901 Long term (current) use of anticoagulants: Secondary | ICD-10-CM

## 2021-05-24 DIAGNOSIS — Z01812 Encounter for preprocedural laboratory examination: Secondary | ICD-10-CM

## 2021-05-24 DIAGNOSIS — I480 Paroxysmal atrial fibrillation: Secondary | ICD-10-CM

## 2021-05-24 DIAGNOSIS — E785 Hyperlipidemia, unspecified: Secondary | ICD-10-CM

## 2021-05-24 DIAGNOSIS — E1159 Type 2 diabetes mellitus with other circulatory complications: Secondary | ICD-10-CM

## 2021-05-24 DIAGNOSIS — I25119 Atherosclerotic heart disease of native coronary artery with unspecified angina pectoris: Secondary | ICD-10-CM

## 2021-05-24 DIAGNOSIS — I5032 Chronic diastolic (congestive) heart failure: Secondary | ICD-10-CM | POA: Diagnosis not present

## 2021-05-24 DIAGNOSIS — I10 Essential (primary) hypertension: Secondary | ICD-10-CM | POA: Diagnosis not present

## 2021-05-24 LAB — BASIC METABOLIC PANEL
BUN/Creatinine Ratio: 20 (ref 10–24)
BUN: 22 mg/dL (ref 8–27)
CO2: 30 mmol/L — ABNORMAL HIGH (ref 20–29)
Calcium: 9.8 mg/dL (ref 8.6–10.2)
Chloride: 98 mmol/L (ref 96–106)
Creatinine, Ser: 1.1 mg/dL (ref 0.76–1.27)
Glucose: 194 mg/dL — ABNORMAL HIGH (ref 70–99)
Potassium: 4.5 mmol/L (ref 3.5–5.2)
Sodium: 137 mmol/L (ref 134–144)
eGFR: 75 mL/min/{1.73_m2} (ref >59)

## 2021-05-24 LAB — CBC
Hematocrit: 36.5 % — ABNORMAL LOW (ref 37.5–51.0)
Hemoglobin: 12.3 g/dL — ABNORMAL LOW (ref 13.0–17.7)
MCH: 30.4 pg (ref 26.6–33.0)
MCHC: 33.7 g/dL (ref 31.5–35.7)
MCV: 90 fL (ref 79–97)
Platelets: 142 10*3/uL — ABNORMAL LOW (ref 150–450)
RBC: 4.04 x10E6/uL — ABNORMAL LOW (ref 4.14–5.80)
RDW: 15 % (ref 11.6–15.4)
WBC: 6.1 10*3/uL (ref 3.4–10.8)

## 2021-05-24 NOTE — Patient Instructions (Addendum)
Medication Instructions:  ?Your physician recommends that you continue on your current medications as directed. Please refer to the Current Medication list given to you today. ? ?*If you need a refill on your cardiac medications before your next appointment, please call your pharmacy* ? ?Lab Work: ?TODAY: CBC, BMET ?If you have labs (blood work) drawn today and your tests are completely normal, you will receive your results only by: ?MyChart Message (if you have MyChart) OR ?A paper copy in the mail ?If you have any lab test that is abnormal or we need to change your treatment, we will call you to review the results. ? ?Testing/Procedures: ?Your physician has requested you have a left cardiac catheterization performed. ? ?Follow-Up: ?At [facility], you and your health needs are our priority.  As part of our continuing mission to provide you with exceptional heart care, we have created designated Provider Care Teams.  These Care Teams include your primary Cardiologist (physician) and Advanced Practice Providers (APPs -  Physician Assistants and Nurse Practitioners) who all work together to provide you with the care you need, when you need it. ? ?Your next appointment:   ?1 month(s) after heart cath ? ?The format for your next appointment:   ?In Person ? ?Provider:   ?Sinclair Grooms, MD { ? ?Other Instructions ? ?[facility] OFF[facility] ?[address], [address] ?[place] [address] ?Dept: [phone] ?LocMN:[id number] ? ?Terry Nelson  05/24/2021 ? ?You are scheduled for a Cardiac Catheterization on Friday, May 26 with Dr. Daneen Schick. ? ?1. Please arrive at the Main Entrance A at [facility]: [place], [place] [address] at 7:00 AM (This time is two hours before your procedure to ensure your preparation). Free valet parking service is available.  ? ?Special note: Every effort is made to have your procedure done  on time. Please understand that emergencies sometimes delay scheduled procedures. ? ?2. Diet: Do not eat solid foods after midnight.  You may have clear liquids until 5 AM upon the day of the procedure. ? ?3. Medication instructions in preparation for your procedure: ? ?Stop taking, Lasix (Furosemide)  and Spironolactone Friday, May 26, ? ?Take only 16 units of insulin the night before your procedure. Do not take any insulin on the day of the procedure. ? ? ?On the morning of your procedure, take Aspirin and Effient/Prasugrel and any morning medicines NOT listed above.  You may use sips of water. ? ?4. Plan to go home the same day, you will only stay overnight if medically necessary. ?5. You MUST have a responsible adult to drive you home. ?6. An adult MUST be with you the first 24 hours after you arrive home. ?7. Bring a current list of your medications, and the last time and date medication taken. ?8. Bring ID and current insurance cards. ?9. Please wear clothes that are easy to get on and off and wear slip-on shoes. ? ?Thank you for allowing [place] to care for you! ?  -- [place] [facility]  ? ? ?Important Information About Sugar ? ? ? ? ?  ?

## 2021-05-29 NOTE — H&P (Signed)
LAD full metal jacket. Need Ostial to proximal IVUS. Suspect will need Scoring balloon and perhaps IVL.

## 2021-05-31 DIAGNOSIS — Z1211 Encounter for screening for malignant neoplasm of colon: Secondary | ICD-10-CM | POA: Diagnosis not present

## 2021-06-01 ENCOUNTER — Telehealth: Payer: Self-pay | Admitting: *Deleted

## 2021-06-01 NOTE — Telephone Encounter (Signed)
Cardiac Catheterization scheduled at [facility] for: Friday Jun 02, 2021 9 AM Arrival time and place: [facility] Main Entrance A at: 7 AM   Nothing to eat after midnight prior to procedure, clear liquids until 5 AM day of procedure.   Medication instructions: -Hold:  Insulin/Farxiga-AM of procedure  Lasix/Spironolactone/KCl-AM of procedure  -Except hold medications usual morning medications can be taken with sips of water including aspirin 81 mg and Effient.  Confirmed patient has responsible adult to drive home post procedure and be with patient first 24 hours after arriving home.  Patient reports no new symptoms concerning for COVID-19/no exposure to COVID-19 in the past 10 days.  Reviewed procedure instructions with patient.

## 2021-06-02 ENCOUNTER — Encounter (HOSPITAL_COMMUNITY): Payer: Self-pay | Admitting: Interventional Cardiology

## 2021-06-02 ENCOUNTER — Encounter (HOSPITAL_COMMUNITY)
Admission: RE | Disposition: A | Discharge status: Home / Self Care | Payer: Self-pay | Source: Home / Self Care | Attending: Interventional Cardiology

## 2021-06-02 ENCOUNTER — Ambulatory Visit (HOSPITAL_COMMUNITY)
Admission: RE | Admit: 2021-06-02 | Discharge: 2021-06-03 | Disposition: A | Discharge status: Home / Self Care | Payer: BC Managed Care – PPO | Attending: Interventional Cardiology | Admitting: Interventional Cardiology

## 2021-06-02 DIAGNOSIS — N182 Chronic kidney disease, stage 2 (mild): Secondary | ICD-10-CM | POA: Insufficient documentation

## 2021-06-02 DIAGNOSIS — E785 Hyperlipidemia, unspecified: Secondary | ICD-10-CM | POA: Diagnosis not present

## 2021-06-02 DIAGNOSIS — Z7901 Long term (current) use of anticoagulants: Secondary | ICD-10-CM | POA: Insufficient documentation

## 2021-06-02 DIAGNOSIS — I25110 Atherosclerotic heart disease of native coronary artery with unstable angina pectoris: Secondary | ICD-10-CM

## 2021-06-02 DIAGNOSIS — I480 Paroxysmal atrial fibrillation: Secondary | ICD-10-CM | POA: Insufficient documentation

## 2021-06-02 DIAGNOSIS — Z7984 Long term (current) use of oral hypoglycemic drugs: Secondary | ICD-10-CM | POA: Diagnosis not present

## 2021-06-02 DIAGNOSIS — Z955 Presence of coronary angioplasty implant and graft: Secondary | ICD-10-CM | POA: Insufficient documentation

## 2021-06-02 DIAGNOSIS — Z951 Presence of aortocoronary bypass graft: Secondary | ICD-10-CM | POA: Diagnosis not present

## 2021-06-02 DIAGNOSIS — Z7902 Long term (current) use of antithrombotics/antiplatelets: Secondary | ICD-10-CM | POA: Diagnosis not present

## 2021-06-02 DIAGNOSIS — E1122 Type 2 diabetes mellitus with diabetic chronic kidney disease: Secondary | ICD-10-CM | POA: Diagnosis not present

## 2021-06-02 DIAGNOSIS — I130 Hypertensive heart and chronic kidney disease with heart failure and stage 1 through stage 4 chronic kidney disease, or unspecified chronic kidney disease: Secondary | ICD-10-CM | POA: Insufficient documentation

## 2021-06-02 DIAGNOSIS — I200 Unstable angina: Secondary | ICD-10-CM | POA: Diagnosis present

## 2021-06-02 DIAGNOSIS — I2510 Atherosclerotic heart disease of native coronary artery without angina pectoris: Secondary | ICD-10-CM

## 2021-06-02 DIAGNOSIS — Z9861 Coronary angioplasty status: Secondary | ICD-10-CM

## 2021-06-02 DIAGNOSIS — Z7985 Long-term (current) use of injectable non-insulin antidiabetic drugs: Secondary | ICD-10-CM | POA: Insufficient documentation

## 2021-06-02 DIAGNOSIS — Y832 Surgical operation with anastomosis, bypass or graft as the cause of abnormal reaction of the patient, or of later complication, without mention of misadventure at the time of the procedure: Secondary | ICD-10-CM | POA: Insufficient documentation

## 2021-06-02 DIAGNOSIS — T82855A Stenosis of coronary artery stent, initial encounter: Secondary | ICD-10-CM | POA: Insufficient documentation

## 2021-06-02 DIAGNOSIS — Z7982 Long term (current) use of aspirin: Secondary | ICD-10-CM | POA: Diagnosis not present

## 2021-06-02 DIAGNOSIS — Z87891 Personal history of nicotine dependence: Secondary | ICD-10-CM | POA: Insufficient documentation

## 2021-06-02 DIAGNOSIS — I5032 Chronic diastolic (congestive) heart failure: Secondary | ICD-10-CM | POA: Diagnosis not present

## 2021-06-02 HISTORY — PX: INTRAVASCULAR PRESSURE WIRE/FFR STUDY: CATH118243

## 2021-06-02 HISTORY — DX: Atherosclerotic heart disease of native coronary artery without angina pectoris: I25.10

## 2021-06-02 HISTORY — PX: LEFT HEART CATH AND CORS/GRAFTS ANGIOGRAPHY: CATH118250

## 2021-06-02 HISTORY — PX: CORONARY BALLOON ANGIOPLASTY: CATH118233

## 2021-06-02 LAB — CBC
HCT: 36.6 % — ABNORMAL LOW (ref 39.0–52.0)
Hemoglobin: 12.4 g/dL — ABNORMAL LOW (ref 13.0–17.0)
MCH: 31.4 pg (ref 26.0–34.0)
MCHC: 33.9 g/dL (ref 30.0–36.0)
MCV: 92.7 fL (ref 80.0–100.0)
Platelets: 140 10*3/uL — ABNORMAL LOW (ref 150–400)
RBC: 3.95 MIL/uL — ABNORMAL LOW (ref 4.22–5.81)
RDW: 14.4 % (ref 11.5–15.5)
WBC: 5.4 10*3/uL (ref 4.0–10.5)
nRBC: 0 % (ref 0.0–0.2)

## 2021-06-02 LAB — CREATININE, SERUM
Creatinine, Ser: 1.01 mg/dL (ref 0.61–1.24)
GFR, Estimated: >60 mL/min (ref >60)

## 2021-06-02 LAB — GLUCOSE, CAPILLARY
Glucose-Capillary: 87 mg/dL (ref 70–99)
Glucose-Capillary: 88 mg/dL (ref 70–99)
Glucose-Capillary: 170 mg/dL — ABNORMAL HIGH (ref 70–99)

## 2021-06-02 LAB — POCT ACTIVATED CLOTTING TIME
Activated Clotting Time: 474 seconds
Activated Clotting Time: 179 seconds

## 2021-06-02 SURGERY — LEFT HEART CATH AND CORS/GRAFTS ANGIOGRAPHY
Anesthesia: LOCAL

## 2021-06-02 MED ORDER — PRASUGREL HCL 10 MG PO TABS: 10.0000 mg | ORAL_TABLET | Freq: Every day | ORAL | Status: DC

## 2021-06-02 MED ORDER — LIDOCAINE HCL (PF) 1 % IJ SOLN
INTRAMUSCULAR | Status: DC | PRN
  Administered 2021-06-02: 5 mL via SUBCUTANEOUS
  Administered 2021-06-02: 15 mL via SUBCUTANEOUS

## 2021-06-02 MED ORDER — SPIRONOLACTONE 25 MG PO TABS
25.0000 mg | ORAL_TABLET | Freq: Every day | ORAL | Status: DC
Start: 2021-06-03 — End: 2021-06-03
  Administered 2021-06-03: 25 mg via ORAL
  Filled 2021-06-02: qty 1

## 2021-06-02 MED ORDER — BIVALIRUDIN BOLUS VIA INFUSION - CUPID
INTRAVENOUS | Status: DC | PRN
  Administered 2021-06-02: 49.35 mg via INTRAVENOUS

## 2021-06-02 MED ORDER — NITROGLYCERIN 1 MG/10 ML FOR IR/CATH LAB
INTRA_ARTERIAL | Status: DC | PRN
  Administered 2021-06-02: 200 ug via INTRACORONARY

## 2021-06-02 MED ORDER — SODIUM CHLORIDE 0.9 % IV SOLN
INTRAVENOUS | Status: DC | PRN
  Administered 2021-06-02: 1.75 mg/kg/h via INTRAVENOUS

## 2021-06-02 MED ORDER — MIDAZOLAM HCL 2 MG/2ML IJ SOLN
INTRAMUSCULAR | Status: AC
  Filled 2021-06-02: qty 2

## 2021-06-02 MED ORDER — NITROGLYCERIN 1 MG/10 ML FOR IR/CATH LAB
INTRA_ARTERIAL | Status: AC
  Filled 2021-06-02: qty 10

## 2021-06-02 MED ORDER — FUROSEMIDE 20 MG PO TABS
40.0000 mg | ORAL_TABLET | Freq: Every day | ORAL | Status: DC
Start: 2021-06-02 — End: 2021-06-03
  Administered 2021-06-02 – 2021-06-03 (×2): 40 mg via ORAL
  Filled 2021-06-02 (×2): qty 2

## 2021-06-02 MED ORDER — TIZANIDINE HCL 4 MG PO TABS
4.0000 mg | ORAL_TABLET | ORAL | Status: DC
Start: 2021-06-03 — End: 2021-06-03
  Filled 2021-06-02: qty 1

## 2021-06-02 MED ORDER — ONDANSETRON HCL 4 MG/2ML IJ SOLN: 4.0000 mg | Freq: Four times a day (QID) | INTRAMUSCULAR | Status: DC | PRN

## 2021-06-02 MED ORDER — POTASSIUM CHLORIDE CRYS ER 20 MEQ PO TBCR
10.0000 meq | EXTENDED_RELEASE_TABLET | Freq: Every day | ORAL | Status: DC
  Administered 2021-06-02 – 2021-06-03 (×2): 10 meq via ORAL
  Filled 2021-06-02 (×2): qty 1

## 2021-06-02 MED ORDER — INSULIN GLARGINE 100 UNIT/ML ~~LOC~~ SOLN
32.0000 [IU] | Freq: Every day | SUBCUTANEOUS | Status: DC
  Filled 2021-06-02: qty 0.32

## 2021-06-02 MED ORDER — INSULIN ASPART 100 UNIT/ML IJ SOLN: 0.0000 [IU] | Freq: Three times a day (TID) | INTRAMUSCULAR | Status: DC

## 2021-06-02 MED ORDER — GABAPENTIN 300 MG PO CAPS
1500.0000 mg | ORAL_CAPSULE | Freq: Two times a day (BID) | ORAL | Status: DC
  Administered 2021-06-02 – 2021-06-03 (×2): 1500 mg via ORAL
  Filled 2021-06-02 (×2): qty 5

## 2021-06-02 MED ORDER — MAGNESIUM OXIDE -MG SUPPLEMENT 400 (240 MG) MG PO TABS
800.0000 mg | ORAL_TABLET | Freq: Every day | ORAL | Status: DC
Start: 2021-06-03 — End: 2021-06-03
  Administered 2021-06-03: 800 mg via ORAL
  Filled 2021-06-02 (×2): qty 2

## 2021-06-02 MED ORDER — DAPAGLIFLOZIN PROPANEDIOL 10 MG PO TABS
10.0000 mg | ORAL_TABLET | Freq: Every day | ORAL | Status: DC
  Administered 2021-06-02 – 2021-06-03 (×2): 10 mg via ORAL
  Filled 2021-06-02 (×2): qty 1

## 2021-06-02 MED ORDER — SODIUM CHLORIDE 0.9 % WEIGHT BASED INFUSION: 1.0000 mL/kg/h | INTRAVENOUS | Status: DC

## 2021-06-02 MED ORDER — SODIUM CHLORIDE 0.9% FLUSH
3.0000 mL | Freq: Two times a day (BID) | INTRAVENOUS | Status: DC
  Administered 2021-06-03: 3 mL via INTRAVENOUS

## 2021-06-02 MED ORDER — HYDRALAZINE HCL 20 MG/ML IJ SOLN: 10.0000 mg | INTRAMUSCULAR | Status: AC | PRN

## 2021-06-02 MED ORDER — LIDOCAINE HCL (PF) 1 % IJ SOLN
INTRAMUSCULAR | Status: AC
  Filled 2021-06-02: qty 30

## 2021-06-02 MED ORDER — FENTANYL CITRATE (PF) 100 MCG/2ML IJ SOLN
INTRAMUSCULAR | Status: DC | PRN
Start: 2021-06-02 — End: 2021-06-02
  Administered 2021-06-02 (×2): 25 ug via INTRAVENOUS
  Administered 2021-06-02: 50 ug via INTRAVENOUS

## 2021-06-02 MED ORDER — HEPARIN SODIUM (PORCINE) 5000 UNIT/ML IJ SOLN
5000.0000 [IU] | Freq: Three times a day (TID) | INTRAMUSCULAR | Status: DC
  Administered 2021-06-02 – 2021-06-03 (×2): 5000 [IU] via SUBCUTANEOUS
  Filled 2021-06-02 (×2): qty 1

## 2021-06-02 MED ORDER — TRAZODONE HCL 50 MG PO TABS
50.0000 mg | ORAL_TABLET | Freq: Every evening | ORAL | Status: DC | PRN
  Administered 2021-06-02: 50 mg via ORAL
  Filled 2021-06-02: qty 1

## 2021-06-02 MED ORDER — SODIUM CHLORIDE 0.9 % WEIGHT BASED INFUSION
3.0000 mL/kg/h | INTRAVENOUS | Status: DC
  Administered 2021-06-02: 3 mL/kg/h via INTRAVENOUS

## 2021-06-02 MED ORDER — SODIUM CHLORIDE 0.9 % IV SOLN: INTRAVENOUS | Status: AC

## 2021-06-02 MED ORDER — PRASUGREL HCL 10 MG PO TABS
10.0000 mg | ORAL_TABLET | Freq: Every day | ORAL | Status: DC
Start: 2021-06-03 — End: 2021-06-03
  Administered 2021-06-03: 10 mg via ORAL
  Filled 2021-06-02: qty 1

## 2021-06-02 MED ORDER — MIDAZOLAM HCL 2 MG/2ML IJ SOLN
INTRAMUSCULAR | Status: DC | PRN
  Administered 2021-06-02 (×2): 1 mg via INTRAVENOUS

## 2021-06-02 MED ORDER — IOHEXOL 350 MG/ML SOLN
INTRAVENOUS | Status: DC | PRN
  Administered 2021-06-02: 130 mL

## 2021-06-02 MED ORDER — SODIUM CHLORIDE 0.9% FLUSH: 3.0000 mL | INTRAVENOUS | Status: DC | PRN

## 2021-06-02 MED ORDER — HEPARIN (PORCINE) IN NACL 1000-0.9 UT/500ML-% IV SOLN
INTRAVENOUS | Status: AC
  Filled 2021-06-02: qty 500

## 2021-06-02 MED ORDER — RANOLAZINE ER 500 MG PO TB12
500.0000 mg | ORAL_TABLET | Freq: Two times a day (BID) | ORAL | Status: DC
  Administered 2021-06-02 – 2021-06-03 (×2): 500 mg via ORAL
  Filled 2021-06-02 (×2): qty 1

## 2021-06-02 MED ORDER — LABETALOL HCL 5 MG/ML IV SOLN: 10.0000 mg | INTRAVENOUS | Status: AC | PRN

## 2021-06-02 MED ORDER — HEPARIN (PORCINE) IN NACL 1000-0.9 UT/500ML-% IV SOLN
INTRAVENOUS | Status: AC
  Filled 2021-06-02: qty 1000

## 2021-06-02 MED ORDER — INSULIN ASPART 100 UNIT/ML IJ SOLN: 0.0000 [IU] | Freq: Every day | INTRAMUSCULAR | Status: DC

## 2021-06-02 MED ORDER — OXYCODONE-ACETAMINOPHEN 5-325 MG PO TABS
1.0000 | ORAL_TABLET | ORAL | Status: DC | PRN
  Administered 2021-06-03: 1 via ORAL
  Filled 2021-06-02: qty 1

## 2021-06-02 MED ORDER — SODIUM CHLORIDE 0.9 % IV SOLN: 250.0000 mL | INTRAVENOUS | Status: DC | PRN

## 2021-06-02 MED ORDER — TIZANIDINE HCL 4 MG PO TABS
8.0000 mg | ORAL_TABLET | Freq: Every day | ORAL | Status: DC
  Administered 2021-06-02: 8 mg via ORAL
  Filled 2021-06-02 (×2): qty 2

## 2021-06-02 MED ORDER — ASPIRIN 81 MG PO CHEW: 81.0000 mg | CHEWABLE_TABLET | ORAL | Status: DC

## 2021-06-02 MED ORDER — NITROGLYCERIN 0.4 MG SL SUBL: 0.4000 mg | SUBLINGUAL_TABLET | SUBLINGUAL | Status: DC | PRN

## 2021-06-02 MED ORDER — AMLODIPINE BESYLATE 5 MG PO TABS
10.0000 mg | ORAL_TABLET | Freq: Every day | ORAL | Status: DC
Start: 2021-06-03 — End: 2021-06-03
  Administered 2021-06-03: 10 mg via ORAL
  Filled 2021-06-02: qty 2

## 2021-06-02 MED ORDER — SODIUM CHLORIDE 0.9 % IV SOLN
250.0000 mL | INTRAVENOUS | Status: DC | PRN
Start: 2021-06-02 — End: 2021-06-03

## 2021-06-02 MED ORDER — PRASUGREL HCL 10 MG PO TABS: 10.0000 mg | ORAL_TABLET | ORAL | Status: DC

## 2021-06-02 MED ORDER — ACETAMINOPHEN 325 MG PO TABS: 650.0000 mg | ORAL_TABLET | ORAL | Status: DC | PRN

## 2021-06-02 MED ORDER — PANTOPRAZOLE SODIUM 40 MG PO TBEC
40.0000 mg | DELAYED_RELEASE_TABLET | Freq: Every day | ORAL | Status: DC
Start: 2021-06-03 — End: 2021-06-03
  Administered 2021-06-03: 40 mg via ORAL
  Filled 2021-06-02: qty 1

## 2021-06-02 MED ORDER — HEPARIN (PORCINE) IN NACL 1000-0.9 UT/500ML-% IV SOLN
INTRAVENOUS | Status: DC | PRN
  Administered 2021-06-02 (×3): 500 mL

## 2021-06-02 MED ORDER — OXYCODONE HCL 5 MG PO TABS: 5.0000 mg | ORAL_TABLET | ORAL | Status: DC | PRN

## 2021-06-02 MED ORDER — ISOSORBIDE MONONITRATE ER 60 MG PO TB24
120.0000 mg | ORAL_TABLET | Freq: Every day | ORAL | Status: DC
  Administered 2021-06-03: 120 mg via ORAL
  Filled 2021-06-02: qty 2

## 2021-06-02 MED ORDER — METOPROLOL SUCCINATE ER 100 MG PO TB24
200.0000 mg | ORAL_TABLET | Freq: Every day | ORAL | Status: DC
Start: 2021-06-03 — End: 2021-06-03
  Administered 2021-06-03: 200 mg via ORAL
  Filled 2021-06-02: qty 2

## 2021-06-02 MED ORDER — SODIUM CHLORIDE 0.9% FLUSH
3.0000 mL | Freq: Two times a day (BID) | INTRAVENOUS | Status: DC
  Administered 2021-06-02 – 2021-06-03 (×2): 3 mL via INTRAVENOUS

## 2021-06-02 MED ORDER — FENTANYL CITRATE (PF) 100 MCG/2ML IJ SOLN
INTRAMUSCULAR | Status: AC
  Filled 2021-06-02: qty 2

## 2021-06-02 MED ORDER — INSULIN GLARGINE-YFGN 100 UNIT/ML ~~LOC~~ SOLN
32.0000 [IU] | Freq: Every day | SUBCUTANEOUS | Status: DC
  Administered 2021-06-03: 32 [IU] via SUBCUTANEOUS
  Filled 2021-06-02: qty 0.32

## 2021-06-02 MED ORDER — OXYCODONE-ACETAMINOPHEN 10-325 MG PO TABS: 1.0000 | ORAL_TABLET | ORAL | Status: DC | PRN

## 2021-06-02 MED ORDER — PROMETHAZINE HCL 25 MG PO TABS
25.0000 mg | ORAL_TABLET | Freq: Two times a day (BID) | ORAL | Status: DC | PRN
  Filled 2021-06-02: qty 1

## 2021-06-02 SURGICAL SUPPLY — 18 items
BALLN SCOREFLEX 2.0X10 (BALLOONS) ×2
BALLN SCOREFLEX 2.50X15 (BALLOONS) ×4
BALLOON SCOREFLEX 2.0X10 (BALLOONS) IMPLANT
BALLOON SCOREFLEX 2.50X15 (BALLOONS) IMPLANT
CATH INFINITI 5FR MULTPACK ANG (CATHETERS) ×1 IMPLANT
CATH VISTA GUIDE 6FR XBLAD3.5 (CATHETERS) ×1 IMPLANT
ELECT DEFIB PAD ADLT CADENCE (PAD) ×1 IMPLANT
GUIDEWIRE PRESSURE X 175 (WIRE) ×1 IMPLANT
KIT ENCORE 26 ADVANTAGE (KITS) ×1 IMPLANT
KIT HEART LEFT (KITS) ×3 IMPLANT
PACK CARDIAC CATHETERIZATION (CUSTOM PROCEDURE TRAY) ×3 IMPLANT
SHEATH PINNACLE 5F 10CM (SHEATH) IMPLANT
SHEATH PINNACLE 6F 10CM (SHEATH) ×1 IMPLANT
SHEATH PROBE COVER 6X72 (BAG) ×1 IMPLANT
TRANSDUCER W/STOPCOCK (MISCELLANEOUS) ×3 IMPLANT
TUBING CIL FLEX 10 FLL-RA (TUBING) ×3 IMPLANT
WIRE ASAHI PROWATER 180CM (WIRE) ×1 IMPLANT
WIRE EMERALD 3MM-J .035X150CM (WIRE) ×1 IMPLANT

## 2021-06-02 NOTE — Progress Notes (Signed)
Site area: Right groin a 6 french arterial sheath was removed by Andrey Spearman RN  Site Prior to Removal:  Level 0  Pressure Applied For 20 MINUTES    Bedrest Beginning at 1310 for 4 hours  Manual:   Yes.    Patient Status During Pull:  stable  Post Pull Groin Site:  Level 0  Post Pull Instructions Given:  Yes.    Post Pull Pulses Present:  Yes.    Dressing Applied:  Yes.    Comments:

## 2021-06-02 NOTE — Interval H&P Note (Signed)
Cath Lab Visit (complete for each Cath Lab visit)  Clinical Evaluation Leading to the Procedure:   ACS: Yes.    Non-ACS:    Anginal Classification: CCS IV  Anti-ischemic medical therapy: Minimal Therapy (1 class of medications)  Non-Invasive Test Results: No non-invasive testing performed  Prior CABG: Previous CABG      History and Physical Interval Note:  06/02/2021 9:43 AM  Terry Nelson  has presented today for surgery, with the diagnosis of chest pain.  The various methods of treatment have been discussed with the patient and family. After consideration of risks, benefits and other options for treatment, the patient has consented to  Procedure(s): LEFT HEART CATH AND CORS/GRAFTS ANGIOGRAPHY (N/A) as a surgical intervention.  The patient's history has been reviewed, patient examined, no change in status, stable for surgery.  I have reviewed the patient's chart and labs.  Questions were answered to the patient's satisfaction.     Lyn Records III

## 2021-06-03 ENCOUNTER — Other Ambulatory Visit: Payer: Self-pay | Admitting: Cardiology

## 2021-06-03 DIAGNOSIS — Z7902 Long term (current) use of antithrombotics/antiplatelets: Secondary | ICD-10-CM | POA: Diagnosis not present

## 2021-06-03 DIAGNOSIS — Z955 Presence of coronary angioplasty implant and graft: Secondary | ICD-10-CM | POA: Diagnosis not present

## 2021-06-03 DIAGNOSIS — Z7901 Long term (current) use of anticoagulants: Secondary | ICD-10-CM | POA: Diagnosis not present

## 2021-06-03 DIAGNOSIS — I5032 Chronic diastolic (congestive) heart failure: Secondary | ICD-10-CM | POA: Diagnosis not present

## 2021-06-03 DIAGNOSIS — I200 Unstable angina: Secondary | ICD-10-CM

## 2021-06-03 DIAGNOSIS — I480 Paroxysmal atrial fibrillation: Secondary | ICD-10-CM

## 2021-06-03 DIAGNOSIS — N182 Chronic kidney disease, stage 2 (mild): Secondary | ICD-10-CM | POA: Diagnosis not present

## 2021-06-03 DIAGNOSIS — I130 Hypertensive heart and chronic kidney disease with heart failure and stage 1 through stage 4 chronic kidney disease, or unspecified chronic kidney disease: Secondary | ICD-10-CM | POA: Diagnosis not present

## 2021-06-03 DIAGNOSIS — I25110 Atherosclerotic heart disease of native coronary artery with unstable angina pectoris: Secondary | ICD-10-CM | POA: Diagnosis not present

## 2021-06-03 DIAGNOSIS — Z87891 Personal history of nicotine dependence: Secondary | ICD-10-CM | POA: Diagnosis not present

## 2021-06-03 DIAGNOSIS — Z7982 Long term (current) use of aspirin: Secondary | ICD-10-CM | POA: Diagnosis not present

## 2021-06-03 DIAGNOSIS — Z7985 Long-term (current) use of injectable non-insulin antidiabetic drugs: Secondary | ICD-10-CM | POA: Diagnosis not present

## 2021-06-03 DIAGNOSIS — E785 Hyperlipidemia, unspecified: Secondary | ICD-10-CM | POA: Diagnosis not present

## 2021-06-03 DIAGNOSIS — T50905D Adverse effect of unspecified drugs, medicaments and biological substances, subsequent encounter: Secondary | ICD-10-CM

## 2021-06-03 DIAGNOSIS — T82855A Stenosis of coronary artery stent, initial encounter: Secondary | ICD-10-CM | POA: Diagnosis not present

## 2021-06-03 DIAGNOSIS — E1122 Type 2 diabetes mellitus with diabetic chronic kidney disease: Secondary | ICD-10-CM | POA: Diagnosis not present

## 2021-06-03 DIAGNOSIS — Z951 Presence of aortocoronary bypass graft: Secondary | ICD-10-CM | POA: Diagnosis not present

## 2021-06-03 DIAGNOSIS — Z7984 Long term (current) use of oral hypoglycemic drugs: Secondary | ICD-10-CM | POA: Diagnosis not present

## 2021-06-03 LAB — CBC
HCT: 39.5 % (ref 39.0–52.0)
Hemoglobin: 13.1 g/dL (ref 13.0–17.0)
MCH: 30.5 pg (ref 26.0–34.0)
MCHC: 33.2 g/dL (ref 30.0–36.0)
MCV: 91.9 fL (ref 80.0–100.0)
Platelets: 173 10*3/uL (ref 150–400)
RBC: 4.3 MIL/uL (ref 4.22–5.81)
RDW: 14.6 % (ref 11.5–15.5)
WBC: 7.7 10*3/uL (ref 4.0–10.5)
nRBC: 0 % (ref 0.0–0.2)

## 2021-06-03 LAB — GLUCOSE, CAPILLARY
Glucose-Capillary: 118 mg/dL — ABNORMAL HIGH (ref 70–99)
Glucose-Capillary: 256 mg/dL — ABNORMAL HIGH (ref 70–99)

## 2021-06-03 LAB — BASIC METABOLIC PANEL
Anion gap: 7 (ref 5–15)
BUN: 22 mg/dL (ref 8–23)
CO2: 27 mmol/L (ref 22–32)
Calcium: 9.5 mg/dL (ref 8.9–10.3)
Chloride: 102 mmol/L (ref 98–111)
Creatinine, Ser: 1.4 mg/dL — ABNORMAL HIGH (ref 0.61–1.24)
GFR, Estimated: 56 mL/min — ABNORMAL LOW (ref >60)
Glucose, Bld: 130 mg/dL — ABNORMAL HIGH (ref 70–99)
Potassium: 3.9 mmol/L (ref 3.5–5.1)
Sodium: 136 mmol/L (ref 135–145)

## 2021-06-03 NOTE — Progress Notes (Signed)
CARDIAC REHAB PHASE I   PRE:  Rate/Rhythm: 57 SB  BP:  Supine: 122/73 Sitting:   Standing:    SaO2: 100% RA  MODE:  Ambulation: 470 ft   POST:  Rate/Rhythm: 73 SR  BP:  Supine:   Sitting: 140/70  Standing:    SaO2: 99% RA  1035-1103 Patient tolerated ambulation well without symptoms, VSS. Reviewed PCI education including CP, NTG use, calling 911 and activity progression. Handouts given on heart healthy eating and diabetes nutrition and exercise guidelines.  Discussed phase 2 cardiac rehab with patient, but patient not interested in participation at this time due to busy schedule, and he walks multiple times per day with his dogs. Pt demonstrates understanding of information given.   Artist Pais, MS, ACSM CEP

## 2021-06-03 NOTE — Progress Notes (Signed)
DAILY PROGRESS NOTE   Patient Name: Terry Nelson Date of Encounter: 06/03/2021 Cardiologist: Sinclair Grooms, MD  Chief Complaint   No complaints  Patient Profile   66 year old male with a history of coronary artery disease and chronic diastolic heart failure presented with unstable angina and had successful PCI to the LAD.  Subjective   No further chest pain overnight.  Terry Nelson is eager to be discharged.  Creatinine appears to be variable between 0.96 up to 1.48.  Overnight creatinine increased again although he urinated 3 L negative.  He denies any shortness of breath.  Objective   Vitals:   06/02/21 2000 06/02/21 2359 06/03/21 0436 06/03/21 1117  BP: 116/75 (!) 95/54 97/61 140/70  Pulse: 63 62 (!) 54 60  Resp: 18 16 16    Temp: 98.7 F (37.1 C) 98.4 F (36.9 C) 97.6 F (36.4 C)   TempSrc: Oral Oral Oral   SpO2: 98% 97% 98%   Weight:      Height:        Intake/Output Summary (Last 24 hours) at 06/03/2021 1149 Last data filed at 06/03/2021 0001 Gross per 24 hour  Intake 325 ml  Output 3350 ml  Net -3025 ml   Filed Weights   06/02/21 0722  Weight: 65.8 kg    Physical Exam   General appearance: alert and no distress Neck: no carotid bruit, no JVD, and thyroid not enlarged, symmetric, no tenderness/mass/nodules Lungs: clear to auscultation bilaterally Heart: regular rate and rhythm, S1, S2 normal, no murmur, click, rub or gallop Abdomen: soft, non-tender; bowel sounds normal; no masses,  no organomegaly Extremities: extremities normal, atraumatic, no cyanosis or edema and right femoral cath site without ecchymosis, bruit or hematoma Pulses: 2+ and symmetric Skin: Skin color, texture, turgor normal. No rashes or lesions Neurologic: Grossly normal Psych: Pleasant  Inpatient Medications    Scheduled Meds:  amLODipine  10 mg Oral Daily   dapagliflozin propanediol  10 mg Oral Daily   furosemide  40 mg Oral Daily   gabapentin  1,500 mg Oral BID   heparin   5,000 Units Subcutaneous Q8H   insulin aspart  0-15 Units Subcutaneous TID WC   insulin aspart  0-5 Units Subcutaneous QHS   insulin glargine-yfgn  32 Units Subcutaneous Daily   isosorbide mononitrate  120 mg Oral Daily   magnesium oxide  800 mg Oral Daily   metoprolol succinate  200 mg Oral Daily   pantoprazole  40 mg Oral Daily   potassium chloride  10 mEq Oral Daily   prasugrel  10 mg Oral Daily   ranolazine  500 mg Oral BID   sodium chloride flush  3 mL Intravenous Q12H   sodium chloride flush  3 mL Intravenous Q12H   spironolactone  25 mg Oral Daily   tiZANidine  4 mg Oral 2 times per day   tiZANidine  8 mg Oral QHS    Continuous Infusions:  sodium chloride      PRN Meds: sodium chloride, acetaminophen, nitroGLYCERIN, ondansetron (ZOFRAN) IV, oxyCODONE-acetaminophen **AND** oxyCODONE, promethazine, sodium chloride flush, traZODone   Labs   Results for orders placed or performed during the hospital encounter of 06/02/21 (from the past 48 hour(s))  Glucose, capillary     Status: None   Collection Time: 06/02/21  7:17 AM  Result Value Ref Range   Glucose-Capillary 87 70 - 99 mg/dL    Comment: Glucose reference range applies only to samples taken after fasting for at least 8  hours.   Comment 1 Notify RN    Comment 2 Document in Chart   POCT Activated clotting time     Status: None   Collection Time: 06/02/21 10:38 AM  Result Value Ref Range   Activated Clotting Time 474 seconds    Comment: Reference range 74-137 seconds for patients not on anticoagulant therapy.  Glucose, capillary     Status: None   Collection Time: 06/02/21 12:36 PM  Result Value Ref Range   Glucose-Capillary 88 70 - 99 mg/dL    Comment: Glucose reference range applies only to samples taken after fasting for at least 8 hours.  POCT Activated clotting time     Status: None   Collection Time: 06/02/21 12:39 PM  Result Value Ref Range   Activated Clotting Time 179 seconds    Comment: Reference range  74-137 seconds for patients not on anticoagulant therapy.  CBC     Status: Abnormal   Collection Time: 06/02/21  2:46 PM  Result Value Ref Range   WBC 5.4 4.0 - 10.5 K/uL   RBC 3.95 (L) 4.22 - 5.81 MIL/uL   Hemoglobin 12.4 (L) 13.0 - 17.0 g/dL   HCT 36.6 (L) 39.0 - 52.0 %   MCV 92.7 80.0 - 100.0 fL   MCH 31.4 26.0 - 34.0 pg   MCHC 33.9 30.0 - 36.0 g/dL   RDW 14.4 11.5 - 15.5 %   Platelets 140 (L) 150 - 400 K/uL    Comment: REPEATED TO VERIFY   nRBC 0.0 0.0 - 0.2 %    Comment: Performed at [facility], [place] [address], [place], [place] [address]  Creatinine, serum     Status: None   Collection Time: 06/02/21  2:46 PM  Result Value Ref Range   Creatinine, Ser 1.01 0.61 - 1.24 mg/dL   GFR, Estimated >60 >60 mL/min    Comment: (NOTE) Calculated using the CKD-EPI Creatinine Equation (2021) Performed at [facility] [address] [place], [place] [address]   Glucose, capillary     Status: Abnormal   Collection Time: 06/02/21 10:10 PM  Result Value Ref Range   Glucose-Capillary 170 (H) 70 - 99 mg/dL    Comment: Glucose reference range applies only to samples taken after fasting for at least 8 hours.  Basic metabolic panel     Status: Abnormal   Collection Time: 06/03/21  3:20 AM  Result Value Ref Range   Sodium 136 135 - 145 mmol/L   Potassium 3.9 3.5 - 5.1 mmol/L   Chloride 102 98 - 111 mmol/L   CO2 27 22 - 32 mmol/L   Glucose, Bld 130 (H) 70 - 99 mg/dL    Comment: Glucose reference range applies only to samples taken after fasting for at least 8 hours.   BUN 22 8 - 23 mg/dL   Creatinine, Ser 1.40 (H) 0.61 - 1.24 mg/dL   Calcium 9.5 8.9 - 10.3 mg/dL   GFR, Estimated 56 (L) >60 mL/min    Comment: (NOTE) Calculated using the CKD-EPI Creatinine Equation (2021)    Anion gap 7 5 - 15    Comment: Performed at [facility] [address], [place] [address]  CBC     Status: None   Collection Time: 06/03/21  3:20 AM  Result Value Ref Range   WBC 7.7  4.0 - 10.5 K/uL   RBC 4.30 4.22 - 5.81 MIL/uL   Hemoglobin 13.1 13.0 - 17.0 g/dL   HCT  39.5 39.0 - 52.0 %   MCV 91.9 80.0 - 100.0 fL   MCH 30.5 26.0 - 34.0 pg   MCHC 33.2 30.0 - 36.0 g/dL   RDW 14.6 11.5 - 15.5 %   Platelets 173 150 - 400 K/uL   nRBC 0.0 0.0 - 0.2 %    Comment: Performed at [facility], [place] [address] [place], [place] [address]  Glucose, capillary     Status: Abnormal   Collection Time: 06/03/21  7:47 AM  Result Value Ref Range   Glucose-Capillary 118 (H) 70 - 99 mg/dL    Comment: Glucose reference range applies only to samples taken after fasting for at least 8 hours.    ECG   Sinus bradycardia at 50, nonspecific T wave changes- Personally Reviewed  Telemetry   Sinus rhythm/bradycardia- Personally Reviewed  Radiology    CARDIAC CATHETERIZATION  Result Date: 06/02/2021 CONCLUSIONS: Diffuse in-stent restenosis in the distal third of the LAD for mild jacket with 95% stenosis reduced to 75% using the score flex cutting balloon, 15 x 2.5 mm in diameter.  Maintained TIMI grade III flow. Proximal to ostial LAD 50 to 70% with RFR 0.96. Circumflex is patent. Sequential saphenous vein graft to obtuse marginals is patent. Native right coronary contains distal eccentric 70% stenosis and widely patent distal RCA to PDA stent Left main is short and widely patent Mild mid to apical anterior wall hypokinesis.  EF 55% with normal LVEDP. RECOMMENDATIONS: Continue risk factor modification. Continue anti-ischemic therapy. Pull and whole sheath this evening with discharge in a.m. is anticipated. Continue long-term aspirin and Effient.    Cardiac Studies   N/A  Assessment   Principal Problem:   CAD (coronary artery disease), native coronary artery Active Problems:   Coronary artery disease involving native coronary artery of native heart with unstable angina pectoris (HCC)   PAF (paroxysmal atrial fibrillation) ([place])   Unstable angina ([place])   Plan   Doing well  today without chest pain.  He urinated 3 L negative yesterday with small jump in his creatinine.  Creatinine seems to have bounced around in the past.  He is asymptomatic.  Eager to be discharged.  No issues with the right groin cath site.  He is a patient of Dr. Tamala Julian and will arrange for follow-up with APP or Dr. Tamala Julian in the next few weeks.  Okay to DC home today.  Time Spent Directly with Patient:  I have spent a total of 25 minutes with the patient reviewing hospital notes, telemetry, EKGs, labs and examining the patient as well as establishing an assessment and plan that was discussed personally with the patient.  > 50% of time was spent in direct patient care.  Length of Stay:  LOS: 0 days   Pixie Casino, MD, [facility], [place] Director of the Advanced Lipid Disorders &  Cardiovascular Risk Reduction Clinic Diplomate of the American Board of Clinical Lipidology Attending Cardiologist  Direct Dial: [phone]  Fax: [phone]  Website:  [email or web address] [email or web address] Doyce Stonehouse 06/03/2021, 11:49 AM

## 2021-06-03 NOTE — Discharge Instructions (Addendum)
Call [facility] at [phone] if any bleeding, swelling or drainage at cath site.  May shower, no tub baths for 48 hours for groin sticks. No lifting over 5 pounds for 3 days.  No Driving for 3 days  Heart healthy diet  Call if any problems before appt.   Do not stop asprin and effient  Have blood work done Tuesday at [address] office.   You can eat before the labs.

## 2021-06-03 NOTE — Discharge Summary (Signed)
Discharge Summary    Patient ID: Terry Nelson MRN: [id number]; DOB: [birth year]  Admit date: 06/02/2021 Discharge date: 06/03/2021  PCP:  Jiles Harold, PA-C   [facility] Providers Cardiologist:  Lesleigh Noe, MD        Discharge Diagnoses    Principal Problem:   CAD (coronary artery disease), native coronary artery Active Problems:   Coronary artery disease involving native coronary artery of native heart with unstable angina pectoris (HCC)   PAF (paroxysmal atrial fibrillation) (HCC)   Unstable angina (HCC)    Diagnostic Studies/Procedures    Cardiac cath 06/02/21 CONCLUSIONS: Diffuse in-stent restenosis in the distal third of the LAD for mild jacket with 95% stenosis reduced to 75% using the score flex cutting balloon, 15 x 2.5 mm in diameter.  Maintained TIMI grade III flow. Proximal to ostial LAD 50 to 70% with RFR 0.96. Circumflex is patent. Sequential saphenous vein graft to obtuse marginals is patent. Native right coronary contains distal eccentric 70% stenosis and widely patent distal RCA to PDA stent Left main is short and widely patent Mild mid to apical anterior wall hypokinesis.  EF 55% with normal LVEDP.   RECOMMENDATIONS:   Continue risk factor modification. Continue anti-ischemic therapy. Pull and whole sheath this evening with discharge in a.m. is anticipated. Continue long-term aspirin and Effient.      Diagnostic Dominance: Right Intervention   _____________   History of Present Illness     Terry Nelson is a 66 y.o. male with hx of CABG in 2002 by Dr. Dorris Fetch, overlapping proximal to distal LAD stents with ISR, multiple subsequent balloon angioplasty procedures, PAF with prior ablation, HtN, DM II, HLD, and CKD.  Increasingly frequent reintervention over the past 12 months with angioplasty, scoring balloons, and lithotripsy but with ongoing symptoms.  High risk orbital atherectomy/rotational atherectomy for poorly expanded  previously placed 2 layer stent offered but patient has not yet decided to move forward.  Pt was seen 05/24/21 in office and pt complained of chest pressure every AM.  As the day goes on it turns into a Hurting pain, with radiation into the armpit and sl NTG does help somewhat.  In Office he had chest pain walking in from parking lot.  Overall the chest pain was increasing.   EKG at that time with normal sinus rhythm, borderline short PR interval, small inferior Q waves, nonspecific T wave flattening, and when compared to EKG performed 03/28/2021, ECG is unchanged.  Dr. Katrinka Blazing recommended cardiac cath with class III-IV angina.   Problem is chronic diffuse in-stent restenosis of full metal jacket stents with 2 layers in many spots in the proximal to distal LAD.   He presented 06/02/21 for elective cardiac cath and cutting balloon which reduced stenosis 95% to 75%.  He did well and admitted for overnight monitoring.   Hospital Course     Consultants: none   Today pt is doing well. He has been seen by Dr. Rennis Golden and found stable for discharge.  He will be on aspirin and effient long term.  Cr is variable between 0.96 to 1.48 today 1.40.  he was neg 3L negative of urine.  will check as outpt.   Pt has been seen by cardiac rehab as well -he is not interested in phase 2.  He has done before and now his schedule is tight. He is very active.   Did the patient have an acute coronary syndrome (MI, NSTEMI, STEMI, etc) this admission?:  No  Did the patient have a percutaneous coronary intervention (stent / angioplasty)?:  Yes.     Cath/PCI Registry Performance & Quality Measures: Aspirin prescribed? - Yes ADP Receptor Inhibitor (Plavix/Clopidogrel, Brilinta/Ticagrelor or Effient/Prasugrel) prescribed (includes medically managed patients)? - Yes High Intensity Statin (Lipitor 40-80mg  or Crestor 20-40mg ) prescribed? - Yes For EF <40%, was ACEI/ARB prescribed? - Not Applicable (EF >/=  00%) For EF <40%, Aldosterone Antagonist (Spironolactone or Eplerenone) prescribed? - Not Applicable (EF >/= 17%) Cardiac Rehab Phase II ordered? - No - pt refused          _____________  Discharge Vitals Blood pressure 140/70, pulse 60, temperature 97.6 F (36.4 C), temperature source Oral, resp. rate 16, height 5\' 7"  (1.702 m), weight 65.8 kg, SpO2 98 %.  Filed Weights   06/02/21 0722  Weight: 65.8 kg    Labs & Radiologic Studies    CBC Recent Labs    06/02/21 1446 06/03/21 0320  WBC 5.4 7.7  HGB 12.4* 13.1  HCT 36.6* 39.5  MCV 92.7 91.9  PLT 140* 494   Basic Metabolic Panel Recent Labs    06/02/21 1446 06/03/21 0320  NA  --  136  K  --  3.9  CL  --  102  CO2  --  27  GLUCOSE  --  130*  BUN  --  22  CREATININE 1.01 1.40*  CALCIUM  --  9.5   Liver Function Tests No results for input(s): AST, ALT, ALKPHOS, BILITOT, PROT, ALBUMIN in the last 72 hours. No results for input(s): LIPASE, AMYLASE in the last 72 hours. High Sensitivity Troponin:   No results for input(s): TROPONINIHS in the last 720 hours.  BNP Invalid input(s): POCBNP D-Dimer No results for input(s): DDIMER in the last 72 hours. Hemoglobin A1C No results for input(s): HGBA1C in the last 72 hours. Fasting Lipid Panel No results for input(s): CHOL, HDL, LDLCALC, TRIG, CHOLHDL, LDLDIRECT in the last 72 hours. Thyroid Function Tests No results for input(s): TSH, T4TOTAL, T3FREE, THYROIDAB in the last 72 hours.  Invalid input(s): FREET3 _____________  CARDIAC CATHETERIZATION  Result Date: 06/02/2021 CONCLUSIONS: Diffuse in-stent restenosis in the distal third of the LAD for mild jacket with 95% stenosis reduced to 75% using the score flex cutting balloon, 15 x 2.5 mm in diameter.  Maintained TIMI grade III flow. Proximal to ostial LAD 50 to 70% with RFR 0.96. Circumflex is patent. Sequential saphenous vein graft to obtuse marginals is patent. Native right coronary contains distal eccentric 70%  stenosis and widely patent distal RCA to PDA stent Left main is short and widely patent Mild mid to apical anterior wall hypokinesis.  EF 55% with normal LVEDP. RECOMMENDATIONS: Continue risk factor modification. Continue anti-ischemic therapy. Pull and whole sheath this evening with discharge in a.m. is anticipated. Continue long-term aspirin and Effient.    Disposition   Pt is being discharged home today in good condition.  Follow-up Plans & Appointments   Call [facility] at [phone] if any bleeding, swelling or drainage at cath site.  May shower, no tub baths for 48 hours for groin sticks. No lifting over 5 pounds for 3 days.  No Driving for 3 days  Heart healthy diet  Call if any problems before appt.   Do not stop asprin and effient  Have blood work done Tuesday at [address] office.   You can eat before the labs.     Follow-up Information     Belva Crome, MD Follow  up on 07/06/2021.   Specialty: Cardiology Why: at 1:40 PM Contact information: [address] [address] [phone]         [place] Office Follow up.   Specialty: Cardiology Why: please go Tuesday to have blood drawn to check kidney function Contact information: [address], [place] [phone]               Discharge Instructions     Amb Referral to Cardiac Rehabilitation   Complete by: As directed    Diagnosis: PTCA   After initial evaluation and assessments completed: Virtual Based Care may be provided alone or in conjunction with Phase 2 Cardiac Rehab based on patient barriers.: Yes       Discharge Medications   Allergies as of 06/03/2021       Reactions   Metoclopramide Other (See Comments)   DYSKINESIAS contractions of whole body   Penicillins Swelling, Rash   Did it involve swelling of the face/tongue/throat, SOB, or low BP? Yes Did it involve sudden or  severe rash/hives, skin peeling, or any reaction on the inside of your mouth or nose? Unknown Did you need to seek medical attention at a hospital or doctor's office? Yes When did it last happen?More than 40 years ago    If all above answers are "NO", may proceed with cephalosporin use.        Medication List     TAKE these medications    amLODipine 10 MG tablet Commonly known as: NORVASC Take 1 tablet (10 mg total) by mouth daily.   Aspirin Low Dose 81 MG tablet Generic drug: aspirin EC Take 1 tablet (81 mg total) by mouth daily. Swallow whole.   atorvastatin 80 MG tablet Commonly known as: LIPITOR Take 80 mg by mouth at bedtime.   BD Pen Needle Nano U/F 32G X 4 MM Misc Generic drug: Insulin Pen Needle USE ONCE AS DIRECTED   Blink Tears 0.25 % Soln Generic drug: Polyethylene Glycol 400 Place 1 drop into both eyes daily as needed (dry eyes).   Contour Next Monitor w/Device Kit daily.   Contour Next Test test strip Generic drug: glucose blood by Does not apply route.   dapagliflozin propanediol 10 MG Tabs tablet Commonly known as: FARXIGA Take 1 tablet (10 mg total) by mouth daily.   furosemide 40 MG tablet Commonly known as: LASIX Take $RemoveBe'40mg'NsequNOBM$  (1 tablet) daily. Take additional $RemoveBeforeD'40mg'[id number]$  as needed for weight gain of 2 pounds overnight or 5 pounds in one week. What changed:  how much to take how to take this when to take this additional instructions   gabapentin 300 MG capsule Commonly known as: NEURONTIN Take 1,500 mg by mouth in the morning and at bedtime.   isosorbide mononitrate 60 MG 24 hr tablet Commonly known as: IMDUR Take 2 tablets (120 mg total) by mouth daily.   magnesium oxide 400 MG tablet Commonly known as: MAG-OX Take 800 mg by mouth every morning.   metoprolol 200 MG 24 hr tablet Commonly known as: Toprol XL Take 1 tablet (200 mg total) by mouth daily.   nitroGLYCERIN 0.4 MG SL tablet Commonly known as: NITROSTAT Place 1 tablet (0.4 mg  total) under the tongue every 5 (five) minutes as needed for chest pain.   oxyCODONE-acetaminophen 10-325 MG tablet Commonly known as: PERCOCET Take 1 tablet by mouth every 4 (four) hours as needed for pain.   pantoprazole 40 MG tablet  Commonly known as: PROTONIX Take 40 mg by mouth every morning.   potassium chloride 10 MEQ tablet Commonly known as: KLOR-CON M Take 1 tablet (10 mEq total) by mouth daily.   prasugrel 10 MG Tabs tablet Commonly known as: EFFIENT Take 10 mg by mouth every morning.   promethazine 25 MG tablet Commonly known as: PHENERGAN Take 25 mg by mouth 2 (two) times daily as needed for nausea or vomiting.   ranolazine 500 MG 12 hr tablet Commonly known as: Ranexa Take 1 tablet (500 mg total) by mouth 2 (two) times daily.   spironolactone 25 MG tablet Commonly known as: ALDACTONE Take 25 mg by mouth every morning.   tiZANidine 4 MG tablet Commonly known as: ZANAFLEX Take 4-8 mg by mouth See admin instructions. Take one tablet (4 mg) by mouth daily with supper, take two tablets (8 mg) daily at bedtime, may also take one tablet (4 mg) in the morning and at noon as needed for headaches.   Toujeo SoloStar 300 UNIT/ML Solostar Pen Generic drug: insulin glargine (1 Unit Dial) Inject 34 Units into the skin every morning.   traZODone 50 MG tablet Commonly known as: DESYREL Take 50 mg by mouth at bedtime as needed for sleep.           Outstanding Labs/Studies   BMP 06/06/21   Duration of Discharge Encounter   Greater than 30 minutes including physician time.  Signed, Cecilie Kicks, NP 06/03/2021, 12:42 PM

## 2021-06-05 LAB — LIPOPROTEIN A (LPA): Lipoprotein (a): 29.7 nmol/L (ref <75.0)

## 2021-06-06 ENCOUNTER — Other Ambulatory Visit: Payer: BC Managed Care – PPO | Admitting: *Deleted

## 2021-06-06 DIAGNOSIS — T50905D Adverse effect of unspecified drugs, medicaments and biological substances, subsequent encounter: Secondary | ICD-10-CM

## 2021-06-06 LAB — BASIC METABOLIC PANEL
BUN/Creatinine Ratio: 19 (ref 10–24)
BUN: 23 mg/dL (ref 8–27)
CO2: 26 mmol/L (ref 20–29)
Calcium: 9.8 mg/dL (ref 8.6–10.2)
Chloride: 97 mmol/L (ref 96–106)
Creatinine, Ser: 1.24 mg/dL (ref 0.76–1.27)
Glucose: 128 mg/dL — ABNORMAL HIGH (ref 70–99)
Potassium: 4.4 mmol/L (ref 3.5–5.2)
Sodium: 138 mmol/L (ref 134–144)
eGFR: 65 mL/min/{1.73_m2} (ref >59)

## 2021-06-09 ENCOUNTER — Encounter (HOSPITAL_COMMUNITY): Payer: Self-pay | Admitting: Interventional Cardiology

## 2021-06-09 LAB — EXTERNAL GENERIC LAB PROCEDURE: COLOGUARD: NEGATIVE

## 2021-06-09 LAB — COLOGUARD: COLOGUARD: NEGATIVE

## 2021-06-20 ENCOUNTER — Telehealth: Payer: Self-pay | Admitting: Interventional Cardiology

## 2021-06-20 NOTE — Telephone Encounter (Signed)
Pt called in with c/o CP. Reports pain started yesterday  to the right side of left nipple.   Occurs while sitting and with movement.  Pt opened up new bottle of NTG yesterday and has 6 tablets left today.  RN advised pt to immediately go to the ED.  Pt refuses expresses thinks chest pain is r/t spasms from May 2023 heart cath.  I again advised pt it would be better to over react then not to react.  Pt is adamant that will not go to the ED at this time.  Has not missed any doses of imdur or ranolazine.  No current BP reading. Pt is requesting a sooner OV with Dr. Katrinka Blazing.  Again reiterated the importance of going to the ED.  Pt reports has 21 stents and knows when to go to the ED and now is not the time.  Pt requesting OV with Dr. Katrinka Blazing. Scheduled pt for 06/21/21 at 4:30 pm .  Again advised pt to call 911 or go to the ED if symptoms persist or get worse.  Pt verbalizes understanding and will call 911 if needed.

## 2021-06-20 NOTE — Progress Notes (Signed)
Cardiology Office Note:    Date:  06/21/2021   ID:  [name], DOB 1955/08/19, MRN [id number]  PCP:  Mila Palmer, PA-C  Cardiologist:  Sinclair Grooms, MD   Referring MD: Mila Palmer, PA-C   Chief Complaint  Patient presents with   Chest Pain    History of Present Illness:    Terry Nelson is a 66 y.o. male with a hx of  CABG in 2002 by Dr. Roxan Hockey, overlapping proximal to distal LAD stents with ISR, multiple subsequent balloon angioplasty procedures, PAF with prior ablation, HtN, DM II, HLD, and CKD.  Increasingly frequent reintervention over the past 12 months with angioplasty, scoring balloons, and lithotripsy but with ongoing symptoms.  High risk orbital atherectomy/rotational atherectomy for poorly expanded previously placed 2 layer stent offered but patient has not yet decided to move forward.  Yesterday, he has severe waxing and waning chest discomfort that lasted greater than 10 hours.  The discomfort eventually went t away last p.m.  He has had no recurrence.  He denies chest discomfort with ambulating into the building today.  He took multiple nitroglycerin tablets yesterday.  Past Medical History:  Diagnosis Date   Anxiety state    Body mass index (bmi) 26.0-26.9, adult    CAD (coronary artery disease)    a. 2002 s/p CABG x 3 (LIMA->LAD, VG->OM1->OM2); b. s/p prior LAD and dRCA/RPDA stenting; c. 04/2019 PTCA & lithotripsy of ISR throughout LAD; d. 08/2020 PCI/scoring balloon PTCA throughout LAD 2/2 ISR; e. 09/2020 Cath: LM nl, LAD 40ost/p, 40p/d, 75d ISR (FFR 0.86), 50d, LCX small, OM1 90, OM2 25, RCA 20d ISR, RPDA 20 ISR, VG->OM1->OM2 ok, LIMA->LAD 116m-->Med rx.   Chronic heart failure with preserved ejection fraction (HFpEF) ([place])    a. 09/2020 Echo: Ef 60-65%, no rwma, nl RV size/fxn. Mod dil LA. triv MR. Mild AoV sclerosis w/o stenosis.   CKD (chronic kidney disease), stage II    DDD (degenerative disc disease), lumbar    DM (diabetes mellitus) ([place])     Gastritis    Headache    Hemorrhage    Hyperlipidemia    Hypertension    Kidney stones    Lumbago    Neck pain    Paroxysmal A-fib ([place])    a. CHA2DS2VASc = 4-->Eliquis; b. 06/2020 Zio: No afib. Up to 20 beats PSVT.   Pernicious anemia    Unstable angina (HCC)     Past Surgical History:  Procedure Laterality Date   ABLATION  2017   CARDIAC CATHETERIZATION     CORONARY ARTERY BYPASS GRAFT     CORONARY BALLOON ANGIOPLASTY N/A 04/27/2019   Procedure: CORONARY BALLOON ANGIOPLASTY;  Surgeon: Nelva Bush, MD;  Location: [facility];  Service: Cardiovascular;  Laterality: N/A;   CORONARY BALLOON ANGIOPLASTY N/A 12/16/2019   Procedure: CORONARY BALLOON ANGIOPLASTY;  Surgeon: Belva Crome, MD;  Location: [facility];  Service: Cardiovascular;  Laterality: N/A;   CORONARY BALLOON ANGIOPLASTY N/A 09/06/2020   Procedure: CORONARY BALLOON ANGIOPLASTY;  Surgeon: Belva Crome, MD;  Location: [facility];  Service: Cardiovascular;  Laterality: N/A;   CORONARY BALLOON ANGIOPLASTY N/A 06/02/2021   Procedure: CORONARY BALLOON ANGIOPLASTY;  Surgeon: Belva Crome, MD;  Location: [facility];  Service: Cardiovascular;  Laterality: N/A;   INTRAVASCULAR PRESSURE WIRE/FFR STUDY N/A 09/30/2020   Procedure: INTRAVASCULAR PRESSURE WIRE/FFR STUDY;  Surgeon: Early Osmond, MD;  Location: [facility];  Service: Cardiovascular;  Laterality:  N/A;   INTRAVASCULAR PRESSURE WIRE/FFR STUDY N/A 06/02/2021   Procedure: INTRAVASCULAR PRESSURE WIRE/FFR STUDY;  Surgeon: Belva Crome, MD;  Location: [facility];  Service: Cardiovascular;  Laterality: N/A;   INTRAVASCULAR ULTRASOUND/IVUS N/A 04/27/2019   Procedure: Intravascular Ultrasound/IVUS;  Surgeon: Nelva Bush, MD;  Location: [facility];  Service: Cardiovascular;  Laterality: N/A;   INTRAVASCULAR ULTRASOUND/IVUS N/A 12/16/2019   Procedure: Intravascular Ultrasound/IVUS;  Surgeon: Belva Crome, MD;   Location: [facility];  Service: Cardiovascular;  Laterality: N/A;   LEFT HEART CATH AND CORS/GRAFTS ANGIOGRAPHY N/A 04/09/2019   Procedure: LEFT HEART CATH AND CORS/GRAFTS ANGIOGRAPHY;  Surgeon: Belva Crome, MD;  Location: [facility];  Service: Cardiovascular;  Laterality: N/A;   LEFT HEART CATH AND CORS/GRAFTS ANGIOGRAPHY N/A 12/16/2019   Procedure: LEFT HEART CATH AND CORS/GRAFTS ANGIOGRAPHY;  Surgeon: Belva Crome, MD;  Location: [facility];  Service: Cardiovascular;  Laterality: N/A;   LEFT HEART CATH AND CORS/GRAFTS ANGIOGRAPHY N/A 09/06/2020   Procedure: LEFT HEART CATH AND CORS/GRAFTS ANGIOGRAPHY;  Surgeon: Belva Crome, MD;  Location: [facility];  Service: Cardiovascular;  Laterality: N/A;   LEFT HEART CATH AND CORS/GRAFTS ANGIOGRAPHY N/A 09/30/2020   Procedure: LEFT HEART CATH AND CORS/GRAFTS ANGIOGRAPHY;  Surgeon: Early Osmond, MD;  Location: [facility];  Service: Cardiovascular;  Laterality: N/A;   LEFT HEART CATH AND CORS/GRAFTS ANGIOGRAPHY N/A 06/02/2021   Procedure: LEFT HEART CATH AND CORS/GRAFTS ANGIOGRAPHY;  Surgeon: Belva Crome, MD;  Location: [facility];  Service: Cardiovascular;  Laterality: N/A;    Current Medications: Current Meds  Medication Sig   amLODipine (NORVASC) 10 MG tablet Take 1 tablet (10 mg total) by mouth daily.   aspirin 81 MG EC tablet Take 1 tablet (81 mg total) by mouth daily. Swallow whole.   atorvastatin (LIPITOR) 80 MG tablet Take 80 mg by mouth at bedtime.   Blood Glucose Monitoring Suppl (CONTOUR NEXT MONITOR) w/Device KIT daily.   dapagliflozin propanediol (FARXIGA) 10 MG TABS tablet Take 1 tablet (10 mg total) by mouth daily.   furosemide (LASIX) 40 MG tablet Take 40mg  (1 tablet) daily. Take additional 40mg  as needed for weight gain of 2 pounds overnight or 5 pounds in one week. (Patient taking differently: Take 40 mg by mouth See admin instructions. Take 40mg  (1 tablet) daily; take additional  40mg  as needed for weight gain of 2 pounds overnight or 5 pounds in one week.)   gabapentin (NEURONTIN) 300 MG capsule Take 1,500 mg by mouth in the morning and at bedtime.   glucose blood (CONTOUR NEXT TEST) test strip by Does not apply route.   Insulin Pen Needle (BD PEN NEEDLE NANO U/F) 32G X 4 MM MISC USE ONCE AS DIRECTED   isosorbide mononitrate (IMDUR) 60 MG 24 hr tablet Take 2 tablets (120 mg total) by mouth daily.   magnesium oxide (MAG-OX) 400 MG tablet Take 800 mg by mouth every morning.   metoprolol (TOPROL XL) 200 MG 24 hr tablet Take 1 tablet (200 mg total) by mouth daily.   nitroGLYCERIN (NITROSTAT) 0.4 MG SL tablet Place 1 tablet (0.4 mg total) under the tongue every 5 (five) minutes as needed for chest pain.   oxyCODONE-acetaminophen (PERCOCET) 10-325 MG tablet Take 1 tablet by mouth every 4 (four) hours as needed for pain.   pantoprazole (PROTONIX) 40 MG tablet Take 40 mg by mouth every morning.   Polyethylene Glycol 400 (BLINK  TEARS) 0.25 % SOLN Place 1 drop into both eyes daily as needed (dry eyes).   potassium chloride (KLOR-CON M) 10 MEQ tablet Take 1 tablet (10 mEq total) by mouth daily.   prasugrel (EFFIENT) 10 MG TABS tablet Take 10 mg by mouth every morning.   promethazine (PHENERGAN) 25 MG tablet Take 25 mg by mouth 2 (two) times daily as needed for nausea or vomiting.   ranolazine (RANEXA) 500 MG 12 hr tablet Take 1 tablet (500 mg total) by mouth 2 (two) times daily.   spironolactone (ALDACTONE) 25 MG tablet Take 25 mg by mouth every morning.   tiZANidine (ZANAFLEX) 4 MG tablet Take 4-8 mg by mouth See admin instructions. Take one tablet (4 mg) by mouth daily with supper, take two tablets (8 mg) daily at bedtime, may also take one tablet (4 mg) in the morning and at noon as needed for headaches.   TOUJEO SOLOSTAR 300 UNIT/ML Solostar Pen Inject 34 Units into the skin every morning.   traZODone (DESYREL) 50 MG tablet Take 50 mg by mouth at bedtime as needed for sleep.      Allergies:   Metoclopramide and Penicillins   Social History   Socioeconomic History   Marital status: Married    Spouse name: Not on file   Number of children: Not on file   Years of education: Not on file   Highest education level: Not on file  Occupational History   Not on file  Tobacco Use   Smoking status: Never   Smokeless tobacco: Former    Types: Chew   Tobacco comments:    per pt chewed tobacco in high school   Vaping Use   Vaping Use: Never used  Substance and Sexual Activity   Alcohol use: Never   Drug use: Never   Sexual activity: Not on file    Comment: MARRIED  Other Topics Concern   Not on file  Social History Narrative   Not on file   Social Determinants of Health   Financial Resource Strain: Not on file  Food Insecurity: Not on file  Transportation Needs: Not on file  Physical Activity: Not on file  Stress: Not on file  Social Connections: Not on file     Family History: The patient's family history includes Diabetes in his father; Heart disease in an other family member; High blood pressure in his mother; Stroke in his mother.  ROS:   Please see the history of present illness.    No blood in urine or stool all other systems reviewed and are negative.  EKGs/Labs/Other Studies Reviewed:    The following studies were reviewed today: No recent imaging studies.  Did have repeat PCI about 10 days ago.  EKG:  EKG normal sinus rhythm/sinus bradycardia with relatively short PR.  No acute ST-T wave change.  No Q wave.  Recent Labs: 02/01/2021: Magnesium 2.1 03/28/2021: ALT 16; BNP 321.8 06/03/2021: Hemoglobin 13.1; Platelets 173 06/06/2021: BUN 23; Creatinine, Ser 1.24; Potassium 4.4; Sodium 138  Recent Lipid Panel    Component Value Date/Time   CHOL 106 02/02/2021 0358   CHOL 136 03/30/2019 1234   TRIG 69 02/02/2021 0358   HDL 45 02/02/2021 0358   HDL 36 (L) 03/30/2019 1234   CHOLHDL 2.4 02/02/2021 0358   VLDL 14 02/02/2021 0358   LDLCALC  47 02/02/2021 0358   LDLCALC 70 03/30/2019 1234    Physical Exam:    VS:  BP 118/64   Pulse (!) 58  Wt 142 lb 12.8 oz (64.8 kg)   SpO2 97%   BMI 22.37 kg/m     Wt Readings from Last 3 Encounters:  06/21/21 142 lb 12.8 oz (64.8 kg)  06/02/21 145 lb (65.8 kg)  05/24/21 145 lb 6.4 oz (66 kg)     GEN: Healthy appearing and in good spirits. No acute distress HEENT: Normal NECK: No JVD. LYMPHATICS: No lymphadenopathy CARDIAC: No murmur. RRR no gallop, or edema. VASCULAR:  Normal Pulses. No bruits. RESPIRATORY:  Clear to auscultation without rales, wheezing or rhonchi  ABDOMEN: Soft, non-tender, non-distended, No pulsatile mass, MUSCULOSKELETAL: No deformity  SKIN: Warm and dry NEUROLOGIC:  Alert and oriented x 3 PSYCHIATRIC:  Normal affect   ASSESSMENT:    1. Coronary artery disease involving native coronary artery of native heart with angina pectoris ([place])   2. Hyperlipidemia LDL goal <70   3. Essential hypertension   4. Chronic heart failure with preserved ejection fraction (HCC)   5. Controlled type 2 diabetes mellitus with other circulatory complication, without long-term current use of insulin (HCC)   6. Chest pain of uncertain etiology    PLAN:    In order of problems listed above:  Extremely prolonged episode of chest pain yesterday in this patient without EKG changes today.  Troponin I will be obtained to document the presence or absence of significant myocardial injury.  There is a chance that he also has microvascular disease in addition to epicardial disease.  Reaction to troponin I if elevated.  Otherwise continue therapy as listed.  69-month follow-up.   Medication Adjustments/Labs and Tests Ordered: Current medicines are reviewed at length with the patient today.  Concerns regarding medicines are outlined above.  Orders Placed This Encounter  Procedures   Troponin I   EKG 12-Lead   No orders of the defined types were placed in this  encounter.   Patient Instructions  Medication Instructions:  Your physician recommends that you continue on your current medications as directed. Please refer to the Current Medication list given to you today.  *If you need a refill on your cardiac medications before your next appointment, please call your pharmacy*  Lab Work: TODAY: Troponin I If you have labs (blood work) drawn today and your tests are completely normal, you will receive your results only by: [place] (if you have MyChart) OR A paper copy in the mail If you have any lab test that is abnormal or we need to change your treatment, we will call you to review the results.   Testing/Procedures: NONE  Follow-Up: At [facility], you and your health needs are our priority.  As part of our continuing mission to provide you with exceptional heart care, we have created designated Provider Care Teams.  These Care Teams include your primary Cardiologist (physician) and Advanced Practice Providers (APPs -  Physician Assistants and Nurse Practitioners) who all work together to provide you with the care you need, when you need it.  Your next appointment:   3 month(s)  The format for your next appointment:   In Person  Provider:   Sinclair Grooms, MD {   Important Information About Sugar         Signed, Sinclair Grooms, MD  06/21/2021 4:37 PM    [place]

## 2021-06-20 NOTE — Telephone Encounter (Signed)
Pt c/o of Chest Pain: STAT if CP now or developed within 24 hours  1. Are you having CP right now? Yes  2. Are you experiencing any other symptoms (ex. SOB, nausea, vomiting, sweating)? Headache, but he believes from the nitroglycerin  3. How long have you been experiencing CP? Last two days  4. Is your CP continuous or coming and going? Continuous   5. Have you taken Nitroglycerin? Yes  Pt had procedure 05/26    ?

## 2021-06-21 ENCOUNTER — Encounter: Payer: Self-pay | Admitting: Interventional Cardiology

## 2021-06-21 ENCOUNTER — Ambulatory Visit (INDEPENDENT_AMBULATORY_CARE_PROVIDER_SITE_OTHER): Payer: BC Managed Care – PPO | Admitting: Interventional Cardiology

## 2021-06-21 VITALS — BP 118/64 | HR 58 | Ht 67.0 in | Wt 142.8 lb

## 2021-06-21 DIAGNOSIS — R079 Chest pain, unspecified: Secondary | ICD-10-CM | POA: Diagnosis not present

## 2021-06-21 DIAGNOSIS — I25119 Atherosclerotic heart disease of native coronary artery with unspecified angina pectoris: Secondary | ICD-10-CM | POA: Diagnosis not present

## 2021-06-21 DIAGNOSIS — I10 Essential (primary) hypertension: Secondary | ICD-10-CM

## 2021-06-21 DIAGNOSIS — I5032 Chronic diastolic (congestive) heart failure: Secondary | ICD-10-CM

## 2021-06-21 DIAGNOSIS — E785 Hyperlipidemia, unspecified: Secondary | ICD-10-CM | POA: Diagnosis not present

## 2021-06-21 DIAGNOSIS — E1159 Type 2 diabetes mellitus with other circulatory complications: Secondary | ICD-10-CM

## 2021-06-21 NOTE — Patient Instructions (Addendum)
Medication Instructions:  Your physician recommends that you continue on your current medications as directed. Please refer to the Current Medication list given to you today.  *If you need a refill on your cardiac medications before your next appointment, please call your pharmacy*  Lab Work: TODAY: Troponin T If you have labs (blood work) drawn today and your tests are completely normal, you will receive your results only by: MyChart Message (if you have MyChart) OR A paper copy in the mail If you have any lab test that is abnormal or we need to change your treatment, we will call you to review the results.   Testing/Procedures: NONE  Follow-Up: At [facility], you and your health needs are our priority.  As part of our continuing mission to provide you with exceptional heart care, we have created designated Provider Care Teams.  These Care Teams include your primary Cardiologist (physician) and Advanced Practice Providers (APPs -  Physician Assistants and Nurse Practitioners) who all work together to provide you with the care you need, when you need it.  Your next appointment:   3 month(s)  The format for your next appointment:   In Person  Provider:   Lesleigh Noe, MD {   Important Information About Sugar

## 2021-06-22 ENCOUNTER — Telehealth: Payer: Self-pay | Admitting: Interventional Cardiology

## 2021-06-22 LAB — TROPONIN T: Troponin T (Highly Sensitive): 53 ng/L (ref 0–22)

## 2021-06-22 NOTE — Telephone Encounter (Signed)
Patient is calling requesting his lab results. States he keeps receiving notifications that there are results for him to review in [place], but he doesn't know how to use/access his my chart. Please advise.

## 2021-06-22 NOTE — Telephone Encounter (Signed)
Spoke with patient who states he is getting alerts from MyChart about his lab results but he cannot view anything.  Advised patient that Dr. Katrinka Blazing needs to review his labs and we will call with any recommendations.  Patient reports he is "fine today." Denies CP today.  Will forward to Dr. Katrinka Blazing to review and advise.
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
## 2021-06-26 DIAGNOSIS — Z125 Encounter for screening for malignant neoplasm of prostate: Secondary | ICD-10-CM | POA: Diagnosis not present

## 2021-06-26 DIAGNOSIS — E1159 Type 2 diabetes mellitus with other circulatory complications: Secondary | ICD-10-CM | POA: Diagnosis not present

## 2021-06-26 DIAGNOSIS — I25118 Atherosclerotic heart disease of native coronary artery with other forms of angina pectoris: Secondary | ICD-10-CM | POA: Diagnosis not present

## 2021-06-29 ENCOUNTER — Telehealth: Payer: Self-pay | Admitting: Interventional Cardiology

## 2021-06-29 NOTE — Telephone Encounter (Signed)
Pt c/o of Chest Pain: STAT if CP now or developed within 24 hours  1. Are you having CP right now? Yes   2. Are you experiencing any other symptoms (ex. SOB, nausea, vomiting, sweating)? no  3. How long have you been experiencing CP? 3 to 4 hours   4. Is your CP continuous or coming and going? Coming and going   5. Have you taken Nitroglycerin? No, he states that this causes more problems.   The other day it was a constant pain but now it is coming and going. He states that he isn't doing anything strenuous.  ?

## 2021-06-29 NOTE — Telephone Encounter (Signed)
The patient said he continues to have chest pain coming and going like him and Dr. Katrinka Blazing talked about at his visit on 06/21/21. The patient said the pain feels different now stating it was uncomfortable and then would become very uncomfortable, before. Now it is uncomfortable but easing off without any medications. Lasts 30 minutes -1 hour per episode. Patient states this has occurred 5-6 times today. No missed doses of medication. Trigger for pain was stress. No VS available. Patient then expressed he was on the way to [facility] for an appointment this evening and he wanted to let us know what was going on incase he needed to seek assist while he was there. Gave ED precautions.  Patient verbalized understanding and agreement.  Will route to MD to advisement.

## 2021-06-30 DIAGNOSIS — D6869 Other thrombophilia: Secondary | ICD-10-CM | POA: Diagnosis not present

## 2021-06-30 DIAGNOSIS — I5032 Chronic diastolic (congestive) heart failure: Secondary | ICD-10-CM | POA: Diagnosis not present

## 2021-06-30 DIAGNOSIS — E1159 Type 2 diabetes mellitus with other circulatory complications: Secondary | ICD-10-CM | POA: Diagnosis not present

## 2021-06-30 DIAGNOSIS — F325 Major depressive disorder, single episode, in full remission: Secondary | ICD-10-CM | POA: Diagnosis not present

## 2021-07-06 ENCOUNTER — Ambulatory Visit: Payer: BC Managed Care – PPO | Admitting: Interventional Cardiology

## 2021-08-15 ENCOUNTER — Encounter (HOSPITAL_COMMUNITY): Payer: Self-pay | Admitting: Emergency Medicine

## 2021-08-15 ENCOUNTER — Emergency Department (HOSPITAL_COMMUNITY): Payer: BC Managed Care – PPO

## 2021-08-15 ENCOUNTER — Emergency Department (HOSPITAL_COMMUNITY)
Admission: EM | Admit: 2021-08-15 | Discharge: 2021-08-15 | Disposition: A | Discharge status: Home / Self Care | Payer: BC Managed Care – PPO | Attending: Emergency Medicine | Admitting: Emergency Medicine

## 2021-08-15 DIAGNOSIS — E119 Type 2 diabetes mellitus without complications: Secondary | ICD-10-CM | POA: Insufficient documentation

## 2021-08-15 DIAGNOSIS — R200 Anesthesia of skin: Secondary | ICD-10-CM | POA: Diagnosis not present

## 2021-08-15 DIAGNOSIS — Y92480 Sidewalk as the place of occurrence of the external cause: Secondary | ICD-10-CM | POA: Diagnosis not present

## 2021-08-15 DIAGNOSIS — Z79899 Other long term (current) drug therapy: Secondary | ICD-10-CM | POA: Diagnosis not present

## 2021-08-15 DIAGNOSIS — S63642A Sprain of metacarpophalangeal joint of left thumb, initial encounter: Secondary | ICD-10-CM

## 2021-08-15 DIAGNOSIS — W19XXXA Unspecified fall, initial encounter: Secondary | ICD-10-CM | POA: Diagnosis not present

## 2021-08-15 DIAGNOSIS — M25511 Pain in right shoulder: Secondary | ICD-10-CM

## 2021-08-15 DIAGNOSIS — Z794 Long term (current) use of insulin: Secondary | ICD-10-CM | POA: Insufficient documentation

## 2021-08-15 DIAGNOSIS — I130 Hypertensive heart and chronic kidney disease with heart failure and stage 1 through stage 4 chronic kidney disease, or unspecified chronic kidney disease: Secondary | ICD-10-CM | POA: Insufficient documentation

## 2021-08-15 DIAGNOSIS — N189 Chronic kidney disease, unspecified: Secondary | ICD-10-CM | POA: Diagnosis not present

## 2021-08-15 DIAGNOSIS — Z7982 Long term (current) use of aspirin: Secondary | ICD-10-CM | POA: Diagnosis not present

## 2021-08-15 DIAGNOSIS — Z7984 Long term (current) use of oral hypoglycemic drugs: Secondary | ICD-10-CM | POA: Insufficient documentation

## 2021-08-15 DIAGNOSIS — M25551 Pain in right hip: Secondary | ICD-10-CM | POA: Diagnosis not present

## 2021-08-15 DIAGNOSIS — I2510 Atherosclerotic heart disease of native coronary artery without angina pectoris: Secondary | ICD-10-CM | POA: Insufficient documentation

## 2021-08-15 DIAGNOSIS — S5332XA Traumatic rupture of left ulnar collateral ligament, initial encounter: Secondary | ICD-10-CM | POA: Diagnosis not present

## 2021-08-15 DIAGNOSIS — S6992XA Unspecified injury of left wrist, hand and finger(s), initial encounter: Secondary | ICD-10-CM | POA: Diagnosis not present

## 2021-08-15 DIAGNOSIS — I509 Heart failure, unspecified: Secondary | ICD-10-CM | POA: Insufficient documentation

## 2021-08-15 MED ORDER — ACETAMINOPHEN 325 MG PO TABS
650.0000 mg | ORAL_TABLET | Freq: Once | ORAL | Status: AC
  Administered 2021-08-15: 650 mg via ORAL
  Filled 2021-08-15: qty 2

## 2021-08-15 NOTE — Progress Notes (Signed)
Orthopedic Tech Progress Note Patient Details:  Terry Nelson 02-Jan-1956 [id number]  Ortho Devices Type of Ortho Device: Thumb velcro splint Ortho Device/Splint Location: LUE Ortho Device/Splint Interventions: Ordered, Application, Adjustment   Post Interventions Patient Tolerated: Well Instructions Provided: Care of device  [name] 08/15/2021, 4:07 PM

## 2021-08-15 NOTE — ED Provider Notes (Signed)
Terry Nelson   CSN: [id number] Arrival date & time: 08/15/21  1149     History  Chief Complaint  Patient presents with   Terry Nelson is a 66 y.o. male.  Terry Nelson is a 66 y.o. male with a history of CAD, paroxysmal A-fib, CHF, hypertension, hyperlipidemia, diabetes, CKD, degenerative disc disease, who presents to the emergency department for evaluation of pain in the right shoulder, right hip and left hand after a fall.  Patient had a mechanical fall just prior to arrival, he reports his foot got stuck between the sidewalk and an expansion joint and he tripped over it falling onto his right side.  No head injury or LOC.  Most severe pain in the right shoulder and right hip.  He reports that he caught his thumb when trying to catch himself with the outstretched left hand.  Pain is worse in the thumb with movement.  Reports that the tip of his thumb feels a bit numb.  No lacerations or wounds.  On aspirin but no blood thinners.  The history is provided by a relative and medical records.  Fall Pertinent negatives include no chest pain, no abdominal pain and no headaches.       Home Medications Prior to Admission medications   Medication Sig Start Date End Date Taking? Authorizing Provider  amLODipine (NORVASC) 10 MG tablet Take 1 tablet (10 mg total) by mouth daily. 04/24/21   Belva Crome, MD  aspirin 81 MG EC tablet Take 1 tablet (81 mg total) by mouth daily. Swallow whole. 02/04/21   Cheryln Manly, NP  atorvastatin (LIPITOR) 80 MG tablet Take 80 mg by mouth at bedtime.    [provider]  Blood Glucose Monitoring Suppl (CONTOUR NEXT MONITOR) w/Device KIT daily. 11/02/19   [provider]  dapagliflozin propanediol (FARXIGA) 10 MG TABS tablet Take 1 tablet (10 mg total) by mouth daily. 03/06/21   Belva Crome, MD  furosemide (LASIX) 40 MG tablet Take 49m (1 tablet) daily. Take additional 467mas  needed for weight gain of 2 pounds overnight or 5 pounds in one week. Patient taking differently: Take 40 mg by mouth See admin instructions. Take 4037m1 tablet) daily; take additional 26m35m needed for weight gain of 2 pounds overnight or 5 pounds in one week. 03/31/21   WalkLoel Dubonnet  gabapentin (NEURONTIN) 300 MG capsule Take 1,500 mg by mouth in the morning and at bedtime.    [provider]  glucose blood (CONTOUR NEXT TEST) test strip by Does not apply route. 11/02/19   [provider]  Insulin Pen Needle (BD PEN NEEDLE NANO U/F) 32G X 4 MM MISC USE ONCE AS DIRECTED 08/09/19   [provider]  isosorbide mononitrate (IMDUR) 60 MG 24 hr tablet Take 2 tablets (120 mg total) by mouth daily. 02/16/21   ContElgie Collard-C  magnesium oxide (MAG-OX) 400 MG tablet Take 800 mg by mouth every morning. 01/10/21   [provider]  metoprolol (TOPROL XL) 200 MG 24 hr tablet Take 1 tablet (200 mg total) by mouth daily. 08/11/20   Chandrasekhar, MaheTerisa Starr  nitroGLYCERIN (NITROSTAT) 0.4 MG SL tablet Place 1 tablet (0.4 mg total) under the tongue every 5 (five) minutes as needed for chest pain. 01/31/21   SmitBelva Crome  oxyCODONE-acetaminophen (PERCOCET) 10-325 MG tablet Take 1 tablet by mouth every 4 (four) hours  as needed for pain.    [provider]  pantoprazole (PROTONIX) 40 MG tablet Take 40 mg by mouth every morning.    [provider]  Polyethylene Glycol 400 (BLINK TEARS) 0.25 % SOLN Place 1 drop into both eyes daily as needed (dry eyes).    [provider]  potassium chloride (KLOR-CON M) 10 MEQ tablet Take 1 tablet (10 mEq total) by mouth daily. 03/31/21   Loel Dubonnet, NP  prasugrel (EFFIENT) 10 MG TABS tablet Take 10 mg by mouth every morning.    [provider]  promethazine (PHENERGAN) 25 MG tablet Take 25 mg by mouth 2 (two) times daily as needed for nausea or vomiting.    [provider]  ranolazine  (RANEXA) 500 MG 12 hr tablet Take 1 tablet (500 mg total) by mouth 2 (two) times daily. 01/25/21   Belva Crome, MD  spironolactone (ALDACTONE) 25 MG tablet Take 25 mg by mouth every morning. 08/26/18   [provider]  tiZANidine (ZANAFLEX) 4 MG tablet Take 4-8 mg by mouth See admin instructions. Take one tablet (4 mg) by mouth daily with supper, take two tablets (8 mg) daily at bedtime, may also take one tablet (4 mg) in the morning and at noon as needed for headaches.    [provider]  TOUJEO SOLOSTAR 300 UNIT/ML Solostar Pen Inject 34 Units into the skin every morning. 11/07/18   [provider]  traZODone (DESYREL) 50 MG tablet Take 50 mg by mouth at bedtime as needed for sleep. 08/26/20   [provider]      Allergies    Metoclopramide and Penicillins    Review of Systems   Review of Systems  Constitutional:  Negative for chills and fever.  Cardiovascular:  Negative for chest pain.  Gastrointestinal:  Negative for abdominal pain.  Musculoskeletal:  Positive for arthralgias.  Neurological:  Negative for syncope and headaches.    Physical Exam Updated Vital Signs BP 122/67 (BP Location: Left Arm)   Pulse (!) 53   Temp 98.5 F (36.9 C) (Oral)   Resp 16   SpO2 98%  Physical Exam Vitals and nursing Nelson reviewed.  Constitutional:      General: He is not in acute distress.    Appearance: Normal appearance. He is well-developed. He is not ill-appearing or diaphoretic.     Comments: Alert, chronically ill-appearing but in no acute distress  HENT:     Head: Normocephalic and atraumatic.     Comments: No hematoma, step-off or deformity Eyes:     General:        Right eye: No discharge.        Left eye: No discharge.  Neck:     Comments: No midline C-spine tenderness Cardiovascular:     Rate and Rhythm: Normal rate and regular rhythm.     Heart sounds: Normal heart sounds.  Pulmonary:     Effort: Pulmonary effort is normal. No respiratory  distress.     Breath sounds: Normal breath sounds.  Chest:     Chest wall: No tenderness.  Abdominal:     Palpations: Abdomen is soft.     Tenderness: There is no abdominal tenderness.  Musculoskeletal:        General: Tenderness present.     Comments: Tenderness over the right shoulder without palpable deformity or swelling, there is also tenderness over the lateral right hip again without deformity or swelling.  No rotation or shortening.  Patient has  been ambulatory. Tenderness to the left thumb with small bruise on the palmar surface of the IP joint, pain made worse with flexion as well as adduction All the joints supple and easily movable, all compartments soft  Neurological:     Mental Status: He is alert and oriented to person, place, and time.     Coordination: Coordination normal.  Psychiatric:        Mood and Affect: Mood normal.        Behavior: Behavior normal.     ED Results / Procedures / Treatments   Labs (all labs ordered are listed, but only abnormal results are displayed) Labs Reviewed - No data to display  EKG None  Radiology CT Hip Right Wo Contrast  Result Date: 08/15/2021 CLINICAL DATA:  Fall.  Right hip pain EXAM: CT OF THE RIGHT HIP WITHOUT CONTRAST TECHNIQUE: Multidetector CT imaging of the right hip was performed according to the standard protocol. Multiplanar CT image reconstructions were also generated. RADIATION DOSE REDUCTION: This exam was performed according to the departmental dose-optimization program which includes automated exposure control, adjustment of the mA and/or kV according to patient size and/or use of iterative reconstruction technique. COMPARISON:  Same-day x-ray FINDINGS: Bones/Joint/Cartilage No acute fracture. No dislocation. No evidence of femoral head avascular necrosis. Mild osteoarthritis of the right hip with joint space narrowing and marginal osteophyte formation. No appreciable hip joint effusion. Included portion of the right  hemipelvis appears intact without evidence of fracture or diastasis. No suspicious lytic or sclerotic bone lesion. Ligaments Suboptimally assessed by CT. Muscles and Tendons No acute musculotendinous abnormality by CT. Nonspecific soft tissue calcifications along the superficial aspect of the right gluteus maximus, which may be along the overlying fascia. Soft tissues No fluid collection or hematoma about the right hip. No right inguinal lymphadenopathy. IMPRESSION: 1. No acute fracture or dislocation of the right hip. 2. Mild osteoarthritis of the right hip. Electronically Signed   By: Davina Poke D.O.   On: 08/15/2021 15:34   DG Hand Complete Left  Result Date: 08/15/2021 CLINICAL DATA:  Fall with numbness and bruising in the left thumb. EXAM: LEFT HAND - COMPLETE 3+ VIEW COMPARISON:  None Available. FINDINGS: Left wrist is located. No evidence for an acute fracture or dislocation involving the left hand. There is irregularity and a small bone fragment along the tip of the little finger distal phalanx which appears chronic. No focal soft tissue swelling in left hand. IMPRESSION: No acute bone abnormality in left hand. Electronically Signed   By: Markus Daft M.D.   On: 08/15/2021 12:52   DG Shoulder Right  Result Date: 08/15/2021 CLINICAL DATA:  Fall with severe right shoulder pain. EXAM: RIGHT SHOULDER - 2+ VIEW COMPARISON:  Chest radiograph 09/29/2020 FINDINGS: Right shoulder is located without a fracture. Again noted is catheter tubing extending from the right neck and appears to terminate near the superior cavoatrial junction. Visualized right ribs are intact. Degenerative changes at the right [facility] joint. IMPRESSION: No acute bone abnormality to the right shoulder. Electronically Signed   By: Markus Daft M.D.   On: 08/15/2021 12:49   DG Hip Unilat  With Pelvis 2-3 Views Right  Result Date: 08/15/2021 CLINICAL DATA:  Fall with right hip pain. EXAM: DG HIP (WITH OR WITHOUT PELVIS) 2-3V RIGHT COMPARISON:   CT pelvis 11/04/2020 FINDINGS: Again noted is surgical hardware at the lumbosacral junction. Chronic soft tissue calcifications in the pelvis. Pelvic bony ring is intact. Right hip is located without a  fracture. Stable sclerosis at the junction of the right femoral head and neck. Again noted is catheter tubing in the left lower abdomen. IMPRESSION: No acute bone abnormality to the pelvis or right hip. Electronically Signed   By: Markus Daft M.D.   On: 08/15/2021 12:47    Procedures Procedures    Medications Ordered in ED Medications  acetaminophen (TYLENOL) tablet 650 mg (650 mg Oral Given 08/15/21 1539)    ED Course/ Medical Decision Making/ A&P                           Medical Decision Making Amount and/or Complexity of Data Reviewed Radiology: ordered.  Risk OTC drugs.   66 y.o. male presents to the ED with complaints of fall, this involves an extensive number of treatment options, and is a complaint that carries with it a high risk of complications and morbidity.  The differential diagnosis includes fracture, dislocation, soft tissue injury, ligamentous injury (Ddx)  On arrival pt is nontoxic, vitals WNL.   Additional history obtained from daughter at bedside. Previous records obtained and reviewed   I ordered medication Tylenol for pain  Imaging Studies ordered:  I ordered imaging studies which included x-rays of the right hip, right shoulder and left hand as well as CT of the right hip, I independently visualized and interpreted imaging which showed no fractures or dislocation, CT of the hip confirms no fracture, mild osteoarthritis.  ED Course:   Discussed reassuring imaging with patient and daughter at bedside.  Suspect skiers thumb injury of the left thumb, placed in thumb spica brace.  Discussed outpatient pain management and outpatient follow-up with hand and orthopedics if pain not improving.  At this time there does not appear to be any evidence of an acute emergency  medical condition requiring further emergent evaluation and the patient appears stable for discharge with appropriate outpatient follow up. Diagnosis and return precautions discussed with patient who verbalizes understanding and is agreeable to discharge.    Portions of this Nelson were generated with [employer]. Dictation errors may occur despite best attempts at proofreading.         Final Clinical Impression(s) / ED Diagnoses Final diagnoses:  Fall, initial encounter  Pain of right hip  Acute pain of right shoulder  Rupture of ulnar collateral ligament of left thumb, initial encounter    Rx / DC Orders ED Discharge Orders     None         Jacqlyn Larsen, Hershal Coria 37/10/62 [id number]    Campbell Stall P, DO 54/62/70 [id number]

## 2021-08-15 NOTE — ED Notes (Signed)
Pt requesting pain meds, RN notified.

## 2021-08-15 NOTE — ED Triage Notes (Signed)
Patient here for evaluation of right shoulder pain and right knee pain after a mechanical fall at 1100 today when patient tripped over an expansion joint in a sidewalk.

## 2021-08-15 NOTE — Discharge Instructions (Addendum)
Use Tylenol 650 mg every 4 hours and apply ice and heat as needed for pain.  Wear thumb brace at all times, can remove to shower.  If thumb pain and range of motion is not improving you should follow-up with hand specialist.  If pain in the shoulder and hip is not improving you should follow-up with orthopedics.

## 2021-08-15 NOTE — ED Provider Triage Note (Signed)
Emergency Medicine Provider Triage Evaluation Note  Terry Nelson , a 66 y.o. male  was evaluated in triage.  Pt complains of right shoulder, right hip and left thumb pain that occurred after a mechanical fall just prior to arrival.  Patient states that he was walking when his foot got stuck in between the sidewalk where it meets the expansion joint and he tripped over and fell onto his right side onto the concrete.  No head injury or loss of consciousness.  Most severe pain is in the right shoulder.  He states his left thumb feels a little bit numb until he starts moving it and then becomes quite painful.  Review of Systems  Positive:  Negative:   Physical Exam  BP 122/67 (BP Location: Left Arm)   Pulse (!) 53   Temp 98.5 F (36.9 C) (Oral)   Resp 16   SpO2 98%  Gen:   Awake, no distress   Resp:  Normal effort  MSK:   Moves extremities without difficulty, limited passive range of motion of the right upper extremity due to pain.  Good elbow flexion and extension.  Tenderness palpation of the right hip. Other:    Medical Decision Making  Medically screening exam initiated at 12:15 PM.  Appropriate orders placed.  Terry Nelson was informed that the remainder of the evaluation will be completed by another provider, this initial triage assessment does not replace that evaluation, and the importance of remaining in the ED until their evaluation is complete.     Janell Quiet, [place] 08/15/21 1217

## 2021-08-15 NOTE — ED Notes (Signed)
Patient verbalizes understanding of discharge instructions. Opportunity for questioning and answers were provided. Pt discharged from ED. Pt taken to ED waiting room via wheelchair.   

## 2021-08-21 ENCOUNTER — Other Ambulatory Visit: Payer: Self-pay

## 2021-08-21 MED ORDER — RANOLAZINE ER 500 MG PO TB12: 500.0000 mg | ORAL_TABLET | Freq: Two times a day (BID) | ORAL | 10 refills | Status: DC

## 2021-08-22 DIAGNOSIS — S63642A Sprain of metacarpophalangeal joint of left thumb, initial encounter: Secondary | ICD-10-CM | POA: Diagnosis not present

## 2021-08-25 ENCOUNTER — Other Ambulatory Visit: Payer: Self-pay

## 2021-08-25 MED ORDER — METOPROLOL SUCCINATE ER 200 MG PO TB24: 200.0000 mg | ORAL_TABLET | Freq: Every day | ORAL | 3 refills | Status: DC

## 2021-09-21 ENCOUNTER — Other Ambulatory Visit (HOSPITAL_BASED_OUTPATIENT_CLINIC_OR_DEPARTMENT_OTHER): Payer: Self-pay

## 2021-09-21 MED ORDER — POTASSIUM CHLORIDE CRYS ER 10 MEQ PO TBCR: 10.0000 meq | EXTENDED_RELEASE_TABLET | Freq: Every day | ORAL | 2 refills | Status: DC

## 2021-09-21 NOTE — Telephone Encounter (Signed)
Received fax from [facility] requesting refills for Potassium.   Pt of Dr. Katrinka Blazing. Please review for refill. Thank you!

## 2021-09-24 NOTE — Progress Notes (Unsigned)
Cardiology Office Note:    Date:  09/25/2021   ID:  [name], DOB [birth year], MRN [id number]  PCP:  Mila Palmer, PA-C  Cardiologist:  Sinclair Grooms, MD   Referring MD: Mila Palmer, PA-C   Chief Complaint  Patient presents with   Coronary Artery Disease    Angina pectoris Extensive LAD stenting (full metal jacket)    History of Present Illness:    Terry Nelson is a 66 y.o. male with a hx of CABG in 2002 by Dr. Roxan Hockey, overlapping proximal to distal LAD stents with ISR, multiple subsequent balloon angioplasty procedures, PAF with prior ablation, HtN, DM II, HLD, and CKD.  Increasingly frequent reintervention over the past 12 months with angioplasty, scoring balloons, and lithotripsy but with ongoing symptoms.  High risk orbital atherectomy/rotational atherectomy for poorly expanded previously placed 2 layer stent offered but patient has not yet decided to move forward.  Most recent PCI performed Jun 02, 2021.  He received Score-Flex ballooning throughout the entire stented segment and post ballooning, RFR was 0.96.Marland Kitchen  Shortly after discharge he complained of recurrent significant chest pain.  He refused to go to the emergency room.  He got no relief from sublingual nitroglycerin.  He was seen on 06/21/2021 and had an EKG that was unchanged, despite 10 hours of pain the day before.  With the duration of pain, troponin T was obtained and was 53 with an upper limit of 20.  EKG did not reveal any new/ischemic changes.  Therefore conservative management was recommended.  Since his 10-hour episode of chest discomfort in June, angina has been infrequent.  He has been able to do more.  He has not needed to use any nitroglycerin.  He even went to a shooting range with friends and enjoyed it immensely.  He has been fishing with his grandchild.  Overall he seems to be doing better.  Past Medical History:  Diagnosis Date   Anxiety state    Body mass index (bmi) 26.0-26.9, adult     CAD (coronary artery disease)    a. 2002 s/p CABG x 3 (LIMA->LAD, VG->OM1->OM2); b. s/p prior LAD and dRCA/RPDA stenting; c. 04/2019 PTCA & lithotripsy of ISR throughout LAD; d. 08/2020 PCI/scoring balloon PTCA throughout LAD 2/2 ISR; e. 09/2020 Cath: LM nl, LAD 40ost/p, 40p/d, 75d ISR (FFR 0.86), 50d, LCX small, OM1 90, OM2 25, RCA 20d ISR, RPDA 20 ISR, VG->OM1->OM2 ok, LIMA->LAD 139m-->Med rx.   Chronic heart failure with preserved ejection fraction (HFpEF) ([place])    a. 09/2020 Echo: Ef 60-65%, no rwma, nl RV size/fxn. Mod dil LA. triv MR. Mild AoV sclerosis w/o stenosis.   CKD (chronic kidney disease), stage II    DDD (degenerative disc disease), lumbar    DM (diabetes mellitus) ([place])    Gastritis    Headache    Hemorrhage    Hyperlipidemia    Hypertension    Kidney stones    Lumbago    Neck pain    Paroxysmal A-fib ([place])    a. CHA2DS2VASc = 4-->Eliquis; b. 06/2020 Zio: No afib. Up to 20 beats PSVT.   Pernicious anemia    Unstable angina (HCC)     Past Surgical History:  Procedure Laterality Date   ABLATION  2017   CARDIAC CATHETERIZATION     CORONARY ARTERY BYPASS GRAFT     CORONARY BALLOON ANGIOPLASTY N/A 04/27/2019   Procedure: CORONARY BALLOON ANGIOPLASTY;  Surgeon: Nelva Bush, MD;  Location: [facility];  Service:  Cardiovascular;  Laterality: N/A;   CORONARY BALLOON ANGIOPLASTY N/A 12/16/2019   Procedure: CORONARY BALLOON ANGIOPLASTY;  Surgeon: Belva Crome, MD;  Location: [facility];  Service: Cardiovascular;  Laterality: N/A;   CORONARY BALLOON ANGIOPLASTY N/A 09/06/2020   Procedure: CORONARY BALLOON ANGIOPLASTY;  Surgeon: Belva Crome, MD;  Location: [facility];  Service: Cardiovascular;  Laterality: N/A;   CORONARY BALLOON ANGIOPLASTY N/A 06/02/2021   Procedure: CORONARY BALLOON ANGIOPLASTY;  Surgeon: Belva Crome, MD;  Location: [facility];  Service: Cardiovascular;  Laterality: N/A;   INTRAVASCULAR PRESSURE WIRE/FFR STUDY N/A 09/30/2020    Procedure: INTRAVASCULAR PRESSURE WIRE/FFR STUDY;  Surgeon: Early Osmond, MD;  Location: [facility];  Service: Cardiovascular;  Laterality: N/A;   INTRAVASCULAR PRESSURE WIRE/FFR STUDY N/A 06/02/2021   Procedure: INTRAVASCULAR PRESSURE WIRE/FFR STUDY;  Surgeon: Belva Crome, MD;  Location: [facility];  Service: Cardiovascular;  Laterality: N/A;   INTRAVASCULAR ULTRASOUND/IVUS N/A 04/27/2019   Procedure: Intravascular Ultrasound/IVUS;  Surgeon: Nelva Bush, MD;  Location: [facility];  Service: Cardiovascular;  Laterality: N/A;   INTRAVASCULAR ULTRASOUND/IVUS N/A 12/16/2019   Procedure: Intravascular Ultrasound/IVUS;  Surgeon: Belva Crome, MD;  Location: [facility];  Service: Cardiovascular;  Laterality: N/A;   LEFT HEART CATH AND CORS/GRAFTS ANGIOGRAPHY N/A 04/09/2019   Procedure: LEFT HEART CATH AND CORS/GRAFTS ANGIOGRAPHY;  Surgeon: Belva Crome, MD;  Location: [facility];  Service: Cardiovascular;  Laterality: N/A;   LEFT HEART CATH AND CORS/GRAFTS ANGIOGRAPHY N/A 12/16/2019   Procedure: LEFT HEART CATH AND CORS/GRAFTS ANGIOGRAPHY;  Surgeon: Belva Crome, MD;  Location: [facility];  Service: Cardiovascular;  Laterality: N/A;   LEFT HEART CATH AND CORS/GRAFTS ANGIOGRAPHY N/A 09/06/2020   Procedure: LEFT HEART CATH AND CORS/GRAFTS ANGIOGRAPHY;  Surgeon: Belva Crome, MD;  Location: [facility];  Service: Cardiovascular;  Laterality: N/A;   LEFT HEART CATH AND CORS/GRAFTS ANGIOGRAPHY N/A 09/30/2020   Procedure: LEFT HEART CATH AND CORS/GRAFTS ANGIOGRAPHY;  Surgeon: Early Osmond, MD;  Location: [facility];  Service: Cardiovascular;  Laterality: N/A;   LEFT HEART CATH AND CORS/GRAFTS ANGIOGRAPHY N/A 06/02/2021   Procedure: LEFT HEART CATH AND CORS/GRAFTS ANGIOGRAPHY;  Surgeon: Belva Crome, MD;  Location: [facility];  Service: Cardiovascular;  Laterality: N/A;    Current Medications: Current Meds  Medication  Sig   amLODipine (NORVASC) 10 MG tablet Take 1 tablet (10 mg total) by mouth daily.   aspirin 81 MG EC tablet Take 1 tablet (81 mg total) by mouth daily. Swallow whole.   atorvastatin (LIPITOR) 80 MG tablet Take 80 mg by mouth at bedtime.   Blood Glucose Monitoring Suppl (CONTOUR NEXT MONITOR) w/Device KIT daily.   dapagliflozin propanediol (FARXIGA) 10 MG TABS tablet Take 1 tablet (10 mg total) by mouth daily.   furosemide (LASIX) 40 MG tablet Take 40mg  (1 tablet) daily. Take additional 40mg  as needed for weight gain of 2 pounds overnight or 5 pounds in one week. (Patient taking differently: Take 40 mg by mouth See admin instructions. Take 40mg  (1 tablet) daily; take additional 40mg  as needed for weight gain of 2 pounds overnight or 5 pounds in one week.)   gabapentin (NEURONTIN) 300 MG capsule Take 1,500 mg by mouth in the morning and at bedtime.   glucose blood (CONTOUR NEXT TEST) test strip by Does not apply route.   insulin glargine, 2 Unit Dial, (TOUJEO MAX  SOLOSTAR) 300 UNIT/ML Solostar Pen Inject 36 Units into the skin in the morning.   Insulin Pen Needle (BD PEN NEEDLE NANO U/F) 32G X 4 MM MISC USE ONCE AS DIRECTED   isosorbide mononitrate (IMDUR) 60 MG 24 hr tablet Take 2 tablets (120 mg total) by mouth daily.   magnesium oxide (MAG-OX) 400 MG tablet Take 800 mg by mouth every morning.   metoprolol (TOPROL XL) 200 MG 24 hr tablet Take 1 tablet (200 mg total) by mouth daily.   nitroGLYCERIN (NITROSTAT) 0.4 MG SL tablet Place 1 tablet (0.4 mg total) under the tongue every 5 (five) minutes as needed for chest pain.   oxyCODONE-acetaminophen (PERCOCET) 10-325 MG tablet Take 1 tablet by mouth every 4 (four) hours as needed for pain.   pantoprazole (PROTONIX) 40 MG tablet Take 40 mg by mouth every morning.   Polyethylene Glycol 400 (BLINK TEARS) 0.25 % SOLN Place 1 drop into both eyes daily as needed (dry eyes).   potassium chloride (KLOR-CON M) 10 MEQ tablet Take 1 tablet (10 mEq total) by  mouth daily.   prasugrel (EFFIENT) 10 MG TABS tablet Take 10 mg by mouth every morning.   promethazine (PHENERGAN) 25 MG tablet Take 25 mg by mouth 2 (two) times daily as needed for nausea or vomiting.   ranolazine (RANEXA) 500 MG 12 hr tablet Take 1 tablet (500 mg total) by mouth 2 (two) times daily.   spironolactone (ALDACTONE) 25 MG tablet Take 25 mg by mouth every morning.   tiZANidine (ZANAFLEX) 4 MG tablet Take 4-8 mg by mouth See admin instructions. Take one tablet (4 mg) by mouth daily with supper, take two tablets (8 mg) daily at bedtime, may also take one tablet (4 mg) in the morning and at noon as needed for headaches.   traZODone (DESYREL) 50 MG tablet Take 50 mg by mouth at bedtime as needed for sleep.     Allergies:   Metoclopramide and Penicillins   Social History   Socioeconomic History   Marital status: Married    Spouse name: Not on file   Number of children: Not on file   Years of education: Not on file   Highest education level: Not on file  Occupational History   Not on file  Tobacco Use   Smoking status: Never   Smokeless tobacco: Former    Types: Chew   Tobacco comments:    per pt chewed tobacco in high school   Vaping Use   Vaping Use: Never used  Substance and Sexual Activity   Alcohol use: Never   Drug use: Never   Sexual activity: Not on file    Comment: MARRIED  Other Topics Concern   Not on file  Social History Narrative   Not on file   Social Determinants of Health   Financial Resource Strain: Not on file  Food Insecurity: Not on file  Transportation Needs: Not on file  Physical Activity: Not on file  Stress: Not on file  Social Connections: Not on file     Family History: The patient's family history includes Diabetes in his father; Heart disease in an other family member; High blood pressure in his mother; Stroke in his mother.  ROS:   Please see the history of present illness.    His wife is now working.  She has MS.  He seems to  be in a better mood.  He has been fishing with his grandson and has gotten a chance to hike his  110-year-old granddaughter.  Last year these were the things that he wanted to do before anything bad happen to him.  All other systems reviewed and are negative.  EKGs/Labs/Other Studies Reviewed:    The following studies were reviewed today: No further imaging has been performed since the PCI in May 2023.  EKG:  EKG not performed today.  Last EKG after the prolonged episode of chest pain revealed nonspecific T wave flattening but no new Q waves or ST segment changes to suggest ischemia.  Recent Labs: 02/01/2021: Magnesium 2.1 03/28/2021: ALT 16; BNP 321.8 06/03/2021: Hemoglobin 13.1; Platelets 173 06/06/2021: BUN 23; Creatinine, Ser 1.24; Potassium 4.4; Sodium 138  Recent Lipid Panel    Component Value Date/Time   CHOL 106 02/02/2021 0358   CHOL 136 03/30/2019 1234   TRIG 69 02/02/2021 0358   HDL 45 02/02/2021 0358   HDL 36 (L) 03/30/2019 1234   CHOLHDL 2.4 02/02/2021 0358   VLDL 14 02/02/2021 0358   LDLCALC 47 02/02/2021 0358   LDLCALC 70 03/30/2019 1234    Physical Exam:    VS:  BP 128/70   Pulse (!) 59   Ht 5\' 7"  (1.702 m)   Wt 139 lb 6.4 oz (63.2 kg)   SpO2 98%   BMI 21.83 kg/m     Wt Readings from Last 3 Encounters:  09/25/21 139 lb 6.4 oz (63.2 kg)  06/21/21 142 lb 12.8 oz (64.8 kg)  06/02/21 145 lb (65.8 kg)     GEN: Slender with about a 6 pound weight loss since May 2023.June 2023 No acute distress HEENT: Normal NECK: No JVD. LYMPHATICS: No lymphadenopathy CARDIAC: No murmur. RRR no gallop, or edema. VASCULAR:  Normal Pulses. No bruits. RESPIRATORY:  Clear to auscultation without rales, wheezing or rhonchi  ABDOMEN: Soft, non-tender, non-distended, No pulsatile mass, MUSCULOSKELETAL: No deformity  SKIN: Warm and dry NEUROLOGIC:  Alert and oriented x 3 PSYCHIATRIC:  Normal affect   ASSESSMENT:    1. Coronary artery disease involving native coronary artery of native  heart with angina pectoris (HCC)   2. Hyperlipidemia LDL goal <70   3. Essential hypertension   4. Chronic heart failure with preserved ejection fraction (HCC)   5. Controlled type 2 diabetes mellitus with other circulatory complication, without long-term current use of insulin (HCC)    PLAN:    In order of problems listed above:  Secondary prevention reviewed.  Encouraged increased physical activity to build endurance and also to serve as a risk reducer for cardiac events. Continue Lipitor 80 mg/day.  Lipid panel needs to be done before the year is over continue Toprol-XL 200 mg daily, Imdur 120 mg daily, furosemide 40 mg as needed if weight gain, amlodipine 10 mg/day, and Aldactone daily. Blood pressure today is under excellent control given the above medication regimen. Chronic diastolic heart failure being managed with SGLT2 and spironolactone. Continue Farxiga.  He will follow-up in 4 to 6 months.  Dr. Marland Kitchen knows his case and actually performed one of his coronary angio's in September 2022.  Current plan is conservative medical management and try to avoid heart catheterization in absence of either enzymes or electrocardiographic abnormalities.  He may have a component of underlying microvascular dysfunction/spasm.   Medication Adjustments/Labs and Tests Ordered: Current medicines are reviewed at length with the patient today.  Concerns regarding medicines are outlined above.  No orders of the defined types were placed in this encounter.  No orders of the defined types were placed in this encounter.  Patient Instructions  Medication Instructions:  Your physician recommends that you continue on your current medications as directed. Please refer to the Current Medication list given to you today.  *If you need a refill on your cardiac medications before your next appointment, please call your pharmacy*  Lab Work: NONE  Testing/Procedures: NONE  Follow-Up: At [facility], you and your health needs are our priority.  As part of our continuing mission to provide you with exceptional heart care, we have created designated Provider Care Teams.  These Care Teams include your primary Cardiologist (physician) and Advanced Practice Providers (APPs -  Physician Assistants and Nurse Practitioners) who all work together to provide you with the care you need, when you need it.  Your next appointment:   4-6 month(s)  The format for your next appointment:   In Person  Provider:   Lenna Sciara, MD  Important Information About Sugar         Signed, Sinclair Grooms, MD  09/25/2021 5:43 PM    [place]

## 2021-09-25 ENCOUNTER — Ambulatory Visit: Payer: BC Managed Care – PPO | Attending: Interventional Cardiology | Admitting: Interventional Cardiology

## 2021-09-25 ENCOUNTER — Encounter: Payer: Self-pay | Admitting: Interventional Cardiology

## 2021-09-25 VITALS — BP 128/70 | HR 59 | Ht 67.0 in | Wt 139.4 lb

## 2021-09-25 DIAGNOSIS — I5032 Chronic diastolic (congestive) heart failure: Secondary | ICD-10-CM

## 2021-09-25 DIAGNOSIS — E1159 Type 2 diabetes mellitus with other circulatory complications: Secondary | ICD-10-CM

## 2021-09-25 DIAGNOSIS — I25119 Atherosclerotic heart disease of native coronary artery with unspecified angina pectoris: Secondary | ICD-10-CM | POA: Diagnosis not present

## 2021-09-25 DIAGNOSIS — I10 Essential (primary) hypertension: Secondary | ICD-10-CM | POA: Diagnosis not present

## 2021-09-25 DIAGNOSIS — E785 Hyperlipidemia, unspecified: Secondary | ICD-10-CM | POA: Diagnosis not present

## 2021-09-25 NOTE — Patient Instructions (Signed)
Medication Instructions:  Your physician recommends that you continue on your current medications as directed. Please refer to the Current Medication list given to you today.  *If you need a refill on your cardiac medications before your next appointment, please call your pharmacy*  Lab Work: NONE  Testing/Procedures: NONE  Follow-Up: At [facility], you and your health needs are our priority.  As part of our continuing mission to provide you with exceptional heart care, we have created designated Provider Care Teams.  These Care Teams include your primary Cardiologist (physician) and Advanced Practice Providers (APPs -  Physician Assistants and Nurse Practitioners) who all work together to provide you with the care you need, when you need it.  Your next appointment:   4-6 month(s)  The format for your next appointment:   In Person  Provider:   Lenna Sciara, MD  Important Information About Sugar

## 2021-10-11 DIAGNOSIS — M791 Myalgia, unspecified site: Secondary | ICD-10-CM | POA: Diagnosis not present

## 2021-10-11 DIAGNOSIS — R070 Pain in throat: Secondary | ICD-10-CM | POA: Diagnosis not present

## 2021-10-11 DIAGNOSIS — R0981 Nasal congestion: Secondary | ICD-10-CM | POA: Diagnosis not present

## 2021-10-11 DIAGNOSIS — R051 Acute cough: Secondary | ICD-10-CM | POA: Diagnosis not present

## 2021-10-11 DIAGNOSIS — J069 Acute upper respiratory infection, unspecified: Secondary | ICD-10-CM | POA: Diagnosis not present

## 2021-10-13 ENCOUNTER — Other Ambulatory Visit: Payer: Self-pay

## 2021-10-13 MED ORDER — RANOLAZINE ER 500 MG PO TB12: 500.0000 mg | ORAL_TABLET | Freq: Two times a day (BID) | ORAL | 3 refills | Status: DC

## 2021-10-27 DIAGNOSIS — S0502XA Injury of conjunctiva and corneal abrasion without foreign body, left eye, initial encounter: Secondary | ICD-10-CM | POA: Diagnosis not present

## 2021-12-25 ENCOUNTER — Telehealth (HOSPITAL_BASED_OUTPATIENT_CLINIC_OR_DEPARTMENT_OTHER): Payer: Self-pay

## 2021-12-25 DIAGNOSIS — I5031 Acute diastolic (congestive) heart failure: Secondary | ICD-10-CM

## 2021-12-25 MED ORDER — FUROSEMIDE 40 MG PO TABS: ORAL_TABLET | ORAL | 9 refills | Status: DC

## 2021-12-25 NOTE — Telephone Encounter (Signed)
Pt's medication was sent to pt's pharmacy as requested. Confirmation received.  °

## 2021-12-25 NOTE — Telephone Encounter (Signed)
Received fax from [facility] requesting refills for Furosemide 40 mg.

## 2022-01-17 ENCOUNTER — Telehealth: Payer: Self-pay

## 2022-01-17 NOTE — Telephone Encounter (Signed)
**Note De-Identified Terry Nelson Obfuscation** Manfred Beadle (Key: [id number]) PA Case ID #: [id number] Rx #: [id number] Outcome Approved today Prior Auth;Coverage Start Date:01/17/2022;Coverage End Date:01/07/2098; Authorization Expiration Date: 01/06/2098 Drug Farxiga 10MG  tablets ePA cloud logo Form Librarian, academic PA Form [phone] NCPDP) Original Claim Info 67)   I have notified [place], [address] (Ph: [phone]) of this approval.

## 2022-02-01 ENCOUNTER — Telehealth: Payer: Self-pay | Admitting: Internal Medicine

## 2022-02-01 NOTE — Telephone Encounter (Signed)
Pt c/o of Chest Pain: STAT if CP now or developed within 24 hours  1. Are you having CP right now? Yes   2. Are you experiencing any other symptoms (ex. SOB, nausea, vomiting, sweating)? nausea  3. How long have you been experiencing CP? Started today  4. Is your CP continuous or coming and going? Continuous   5. Have you taken Nitroglycerin? Yes  ?

## 2022-02-01 NOTE — Telephone Encounter (Signed)
Pt called in to report CP.  CP began around 9 am today.  Per pt pain is sharp and lots of pressure.   Pt has taken 6-8 NTG today with little relief.  CP will stop for a short time and then restart.  Last took NTG about 2 min ago.  Pt has left arm pain.  007/62-26.   Pt is hesitant to go to the ED d/t wait time.  Advised pt that CP is top priority and should be treated with urgency.  Advised pt to go to the ED for evaluation.   Pt reports daughter is calling in needs to take her call.  Advised pt prior to call end to report to ED.    Former Dr. Tamala Julian pt scheduled to see Dr. Ali Lowe 03/27/22.

## 2022-03-22 ENCOUNTER — Telehealth: Payer: Self-pay | Admitting: *Deleted

## 2022-03-22 NOTE — Telephone Encounter (Signed)
   Pre-operative Risk Assessment    Patient Name: Terry Nelson  DOB: [birth year] MRN: [id number]      Request for Surgical Clearance    Procedure:   Lumbar Laminectomy  Date of Surgery:  Clearance TBD                                 Surgeon:  Dr. Pieter Partridge Dawley Surgeon's Group or Practice Name:  [facility] Phone number:  [phone] Fax number:  [phone]   Type of Clearance Requested:   - Medical  - Pharmacy:  Hold Aspirin Not Indicated   Type of Anesthesia:  General    Additional requests/questions:    Signed, Greer Ee   03/22/2022, 1:37 PM

## 2022-03-22 NOTE — Telephone Encounter (Signed)
   Name: Terry Nelson  DOB: [birth year]  MRN: [id number]  Primary Cardiologist: Sinclair Grooms, MD (Inactive)  Chart reviewed as part of pre-operative protocol coverage. The patient has an upcoming visit scheduled with Dr. Ali Lowe on 03/27/2022 at which time clearance can be addressed in case there are any issues that would impact surgical recommendations.  Lumbar laminectomy is not scheduled until TBD as below. I added preop FYI to appointment note so that provider is aware to address at time of outpatient visit.  Per office protocol the cardiology provider should forward their finalized clearance decision and recommendations regarding antiplatelet therapy to the requesting party below.    I will route this message as FYI to requesting party and remove this message from the preop box as separate preop APP input not needed at this time.   Please call with any questions.  Lenna Sciara, NP  03/22/2022, 4:42 PM

## 2022-03-23 NOTE — Progress Notes (Addendum)
Cardiology Office Note:    Date:  03/27/2022   ID:  Terry Nelson, DOB September 10, 1955, MRN [id number]  PCP:  Mila Palmer, PA-C   [facility] Cardiologist:  Lenna Sciara, MD Referring MD: Mila Palmer, PA-C   Chief Complaint/Reason for Referral: Cardiology follow-up  ASSESSMENT:    1. Coronary artery disease involving native coronary artery of native heart with angina pectoris ([place])   2. Type 2 diabetes mellitus with complication, with long-term current use of insulin ([place])   3. Hypertension associated with diabetes ([place])   4. Hyperlipidemia associated with type 2 diabetes mellitus ([place])   5. CKD (chronic kidney disease) stage 2, GFR 60-89 ml/min   6. Preoperative cardiovascular examination     PLAN:    In order of problems listed above: 1.  Coronary artery disease: I reviewed the patient's most recent angiogram.  He has undilatable stents with multiple layers of stent.  He is currently on prasugrel.  I think his options are limited in regards to any revascularization procedure with perhaps the only other thing being intravascular lithotripsy.  For now continue Toprol, ranolazine, and Imdur. 2.  Type 2 diabetes: Continue aspirin, atorvastatin, and Jardiance.  Start losartan 25 mg. 3.  Hypertension: Blood pressure is well-controlled on his current regimen.  Would benefit from losartan given his history of diabetes we will defer this for later.  Will discontinue amlodipine and start losartan. 4.  Hyperlipidemia: Check lipid panel LFTs next week. 5.  CKD: Continue Jardiance and start losartan for renal preservation. 6.  Preoperative cardiovascular examination: He can do 4 METS of activity without chest pain.  He has multiple layers of stent and underwent balloon angioplasty in May 2023.  He is at low risk for cardiovascular complications during noncardiac surgery.  He may hold his dual antiplatelet therapy for 5 days prior to the procedure and then resume as soon as  possible.             Dispo:  Return in about 6 months (around 09/27/2022).      Medication Adjustments/Labs and Tests Ordered: Current medicines are reviewed at length with the patient today.  Concerns regarding medicines are outlined above.  The following changes have been made:     Labs/tests ordered: Orders Placed This Encounter  Procedures   Comprehensive metabolic panel   Lipid panel   EKG 12-Lead    Medication Changes: Meds ordered this encounter  Medications   losartan (COZAAR) 25 MG tablet    Sig: Take 1 tablet (25 mg total) by mouth daily.    Dispense:  90 tablet    Refill:  3    Stop amlodipine     Current medicines are reviewed at length with the patient today.  The patient does not have concerns regarding medicines.   History of Present Illness:    FOCUSED PROBLEM LIST:   1.  Coronary artery disease status post CABG consisting of a LIMA to LAD and vein graft to obtuse marginal 1 sequential to obtuse marginal 2 in 2002; LIMA to LAD is now occluded. 2.  Extensive history of multiple percutaneous coronary interventions with a full metal jacket of the proximal to distal LAD with multiple layers of stents and areas of undilated stents in the mid and distal LAD with most recent intervention in May 2023 with cutting balloon angioplasty 3.  Type 2 diabetes on insulin 4.  Hypertension 5.  Hyperlipidemia; low LP(a) 6.  CKD stage II  The patient is a 67  y.o. male with the indicated medical history here for routine cardiology follow-up.  The patient was seen last by Dr. Tamala Julian around 6 months ago.  He had previously done cutting balloon angioplasty due to ongoing symptoms.  The patient developed an episode of severe chest pain following the procedure however at his last visit with Dr. Tamala Julian was doing relatively well.  He is to undergo lumbar laminectomy with general anesthesia in the coming weeks.  He is doing well.  He denies any exertional angina with his activities  of daily living.  His breathing is also well.  He denies any significant bleeding or bruising.  He is required no hospitalizations or emergency room visit room visits.  He has not had any severe episodes of chest pain like the 1 that occurred several months ago.  He is otherwise well without complaints.          Current Medications: Current Meds  Medication Sig   aspirin 81 MG EC tablet Take 1 tablet (81 mg total) by mouth daily. Swallow whole.   atorvastatin (LIPITOR) 80 MG tablet Take 80 mg by mouth at bedtime.   Blood Glucose Monitoring Suppl (CONTOUR NEXT MONITOR) w/Device KIT daily.   dapagliflozin propanediol (FARXIGA) 10 MG TABS tablet Take 1 tablet (10 mg total) by mouth daily.   furosemide (LASIX) 40 MG tablet Take 40mg  (1 tablet) daily. Take additional 40mg  as needed for weight gain of 2 pounds overnight or 5 pounds in one week.   gabapentin (NEURONTIN) 300 MG capsule Take 1,500 mg by mouth in the morning and at bedtime. Per patient taking 1,200 mg in the morning and 1,500 mg at bedtime   glucose blood (CONTOUR NEXT TEST) test strip by Does not apply route.   Insulin Pen Needle (BD PEN NEEDLE NANO U/F) 32G X 4 MM MISC USE ONCE AS DIRECTED   isosorbide mononitrate (IMDUR) 60 MG 24 hr tablet Take 2 tablets (120 mg total) by mouth daily.   losartan (COZAAR) 25 MG tablet Take 1 tablet (25 mg total) by mouth daily.   magnesium oxide (MAG-OX) 400 MG tablet Take 800 mg by mouth every morning.   metoprolol (TOPROL XL) 200 MG 24 hr tablet Take 1 tablet (200 mg total) by mouth daily.   nitroGLYCERIN (NITROSTAT) 0.4 MG SL tablet Place 1 tablet (0.4 mg total) under the tongue every 5 (five) minutes as needed for chest pain.   oxyCODONE-acetaminophen (PERCOCET) 10-325 MG tablet Take 1 tablet by mouth every 4 (four) hours as needed for pain.   pantoprazole (PROTONIX) 40 MG tablet Take 40 mg by mouth every morning.   Polyethylene Glycol 400 (BLINK TEARS) 0.25 % SOLN Place 1 drop into both eyes  daily as needed (dry eyes).   potassium chloride (KLOR-CON M) 10 MEQ tablet Take 1 tablet (10 mEq total) by mouth daily.   prasugrel (EFFIENT) 10 MG TABS tablet Take 10 mg by mouth every morning.   promethazine (PHENERGAN) 25 MG tablet Take 25 mg by mouth 2 (two) times daily as needed for nausea or vomiting.   ranolazine (RANEXA) 500 MG 12 hr tablet Take 1 tablet (500 mg total) by mouth 2 (two) times daily.   spironolactone (ALDACTONE) 25 MG tablet Take 25 mg by mouth every morning.   tiZANidine (ZANAFLEX) 4 MG tablet Take 4-8 mg by mouth See admin instructions. Take one tablet (4 mg) by mouth daily with supper, take two tablets (8 mg) daily at bedtime, may also take one tablet (4 mg) in  the morning and at noon as needed for headaches.   traZODone (DESYREL) 50 MG tablet Take 50 mg by mouth at bedtime as needed for sleep.   [DISCONTINUED] amLODipine (NORVASC) 10 MG tablet Take 1 tablet (10 mg total) by mouth daily.   [DISCONTINUED] insulin glargine, 2 Unit Dial, (TOUJEO MAX SOLOSTAR) 300 UNIT/ML Solostar Pen Inject 36 Units into the skin in the morning.     Allergies:    Metoclopramide and Penicillins   Social History:   Social History   Tobacco Use   Smoking status: Never   Smokeless tobacco: Former    Types: Chew   Tobacco comments:    per pt chewed tobacco in high school   Vaping Use   Vaping Use: Never used  Substance Use Topics   Alcohol use: Never   Drug use: Never     Family Hx: Family History  Problem Relation Age of Onset   High blood pressure Mother    Stroke Mother    Diabetes Father    Heart disease Other      Review of Systems:   Please see the history of present illness.    All other systems reviewed and are negative.     EKGs/Labs/Other Test Reviewed:    EKG:  EKG performed June 2023 that I personally reviewed demonstrates sinus bradycardia with nonspecific ST and T wave changes; EKG performed today that I personally reviewed demonstrates sinus  bradycardia and isolated Q-wave in lead III.  Prior CV studies:  Cardiac Studies & Procedures   CARDIAC CATHETERIZATION  CARDIAC CATHETERIZATION 06/02/2021  Narrative CONCLUSIONS: Diffuse in-stent restenosis in the distal third of the LAD for mild jacket with 95% stenosis reduced to 75% using the score flex cutting balloon, 15 x 2.5 mm in diameter.  Maintained TIMI grade III flow. Proximal to ostial LAD 50 to 70% with RFR 0.96. Circumflex is patent. Sequential saphenous vein graft to obtuse marginals is patent. Native right coronary contains distal eccentric 70% stenosis and widely patent distal RCA to PDA stent Left main is short and widely patent Mild mid to apical anterior wall hypokinesis.  EF 55% with normal LVEDP.  RECOMMENDATIONS:  Continue risk factor modification. Continue anti-ischemic therapy. Pull and whole sheath this evening with discharge in a.m. is anticipated. Continue long-term aspirin and Effient.  Findings Coronary Findings Diagnostic  Dominance: Right  Left Anterior Descending Ost LAD to Prox LAD lesion is 40% stenosed. Prox LAD to Mid LAD lesion is 40% stenosed with 40% stenosed side branch in 2nd Diag. The lesion was previously treated . Dist LAD-1 lesion is 95% stenosed. Vessel is the culprit lesion. The lesion is type C. The lesion was previously treated . Dist LAD-2 lesion is 95% stenosed. Vessel is the culprit lesion. The lesion is tubular. The lesion was previously treated . Dist LAD-3 lesion is 50% stenosed.  Left Circumflex Vessel is small.  First Obtuse Marginal Branch 1st Mrg lesion is 90% stenosed.  Second Obtuse Marginal Branch 2nd Mrg lesion is 25% stenosed.  Third Obtuse Marginal Branch Vessel is small in size.  Right Coronary Artery There is moderate diffuse disease throughout the vessel. Mid RCA to Dist RCA lesion is 70% stenosed. Dist RCA lesion is 20% stenosed. The lesion was previously treated .  Right Posterior Descending  Artery There is moderate disease in the vessel. RPDA lesion is 20% stenosed. The lesion was previously treated .  First Right Posterolateral Branch Vessel is small in size.  Sequential Graft To 1st Mrg,  2nd Mrg  LIMA Graft To Mid LAD And is small. Mid Graft to Insertion lesion is 100% stenosed.  Intervention  Dist LAD-1 lesion Angioplasty Scoring balloon angioplasty was performed. Angioplasty (Also treats lesions: Dist LAD-2) Post-Intervention Lesion Assessment The intervention was successful. Pre-interventional TIMI flow is 3. Post-intervention TIMI flow is 3. There is a 75% residual stenosis post intervention.  Dist LAD-2 lesion Angioplasty (Also treats lesions: Dist LAD-1) Post-Intervention Lesion Assessment The intervention was successful. Pre-interventional TIMI flow is 3. Post-intervention TIMI flow is 3. There is a 75% residual stenosis post intervention.   CARDIAC CATHETERIZATION  CARDIAC CATHETERIZATION 10/02/2020  Narrative Relatively unchanged burden of disease with extensive stenting of the LAD; there is an area of the distal LAD with multiple layers of stent associated with a resting FFR of 0.86 (positive); resting FFR assessment proximal to this area was >0.90. Patent vein graft to OM. Mild disease of RCA.  RECOMMENDATION:  Given the recurrence of recoil in the distal LAD (with multiple layers of stent and prior treatment with Shockwave lithotripsy), consideration for either rotational or orbital atherectomy of this area will be discussed and considered.  For now, intensive medical therapy will be pursued.  Findings Coronary Findings Diagnostic  Dominance: Right  Left Anterior Descending Ost LAD to Prox LAD lesion is 40% stenosed. Prox LAD to Dist LAD lesion is 40% stenosed with 40% stenosed side branch in 2nd Diag. The lesion was previously treated . Dist LAD-1 lesion is 75% stenosed. The lesion was previously treated . Dist LAD-2 lesion is 50%  stenosed.  Left Circumflex Vessel is small.  First Obtuse Marginal Branch 1st Mrg lesion is 90% stenosed.  Second Obtuse Marginal Branch 2nd Mrg lesion is 25% stenosed.  Third Obtuse Marginal Branch Vessel is small in size.  Right Coronary Artery There is moderate diffuse disease throughout the vessel. Dist RCA lesion is 20% stenosed. The lesion was previously treated .  Right Posterior Descending Artery There is moderate disease in the vessel. RPDA lesion is 20% stenosed. The lesion was previously treated .  First Right Posterolateral Branch Vessel is small in size.  Sequential Graft To 1st Mrg, 2nd Mrg  LIMA Graft To Mid LAD And is small. Mid Graft to Insertion lesion is 100% stenosed.  Intervention  No interventions have been documented.     ECHOCARDIOGRAM  ECHOCARDIOGRAM COMPLETE 02/02/2021  Narrative ECHOCARDIOGRAM REPORT    Patient Name:   Terry Nelson Date of Exam: 02/02/2021 Medical Rec #:  [id number]     Height:       66.0 in Accession #:    [id number]    Weight:       146.0 lb Date of Birth:  1955/12/20     BSA:          1.749 m Patient Age:    16 years      BP:           112/73 mmHg Patient Gender: M             HR:           63 bpm. Exam Location:  Inpatient  Procedure: 2D Echo  Indications:    Dyspnea  History:        Patient has prior history of Echocardiogram examinations, most recent 09/30/2020. CAD; Risk Factors:Hypertension.  Sonographer:    Jefferey Pica Referring Phys: [id number] CHRISTOPHER RONALD [place]   1. Left ventricular ejection fraction, by estimation, is 60 to 65%. The left ventricle has  normal function. The left ventricle has no regional wall motion abnormalities. There is mild left ventricular hypertrophy of the basal-septal segment. Left ventricular diastolic parameters are consistent with Grade III diastolic dysfunction (restrictive). Elevated left ventricular end-diastolic pressure. 2. Right ventricular  systolic function is normal. The right ventricular size is normal. Tricuspid regurgitation signal is inadequate for assessing PA pressure. 3. Left atrial size was severely dilated. 4. The mitral valve is degenerative. Mild mitral valve regurgitation. No evidence of mitral stenosis. 5. The aortic valve is tricuspid. Aortic valve regurgitation is not visualized. Aortic valve sclerosis is present, with no evidence of aortic valve stenosis. Aortic valve Vmax measures 1.12 m/s. 6. The inferior vena cava is dilated in size with <50% respiratory variability, suggesting right atrial pressure of 15 mmHg.  FINDINGS Left Ventricle: Left ventricular ejection fraction, by estimation, is 60 to 65%. The left ventricle has normal function. The left ventricle has no regional wall motion abnormalities. The left ventricular internal cavity size was normal in size. There is mild left ventricular hypertrophy of the basal-septal segment. Left ventricular diastolic parameters are consistent with Grade III diastolic dysfunction (restrictive). Elevated left ventricular end-diastolic pressure.  Right Ventricle: The right ventricular size is normal. No increase in right ventricular wall thickness. Right ventricular systolic function is normal. Tricuspid regurgitation signal is inadequate for assessing PA pressure.  Left Atrium: Left atrial size was severely dilated.  Right Atrium: Right atrial size was normal in size.  Pericardium: There is no evidence of pericardial effusion.  Mitral Valve: The mitral valve is degenerative in appearance. There is mild thickening of the mitral valve leaflet(s). There is mild calcification of the mitral valve leaflet(s). Mild mitral valve regurgitation. No evidence of mitral valve stenosis.  Tricuspid Valve: The tricuspid valve is normal in structure. Tricuspid valve regurgitation is trivial. No evidence of tricuspid stenosis.  Aortic Valve: The aortic valve is tricuspid. Aortic valve  regurgitation is not visualized. Aortic valve sclerosis is present, with no evidence of aortic valve stenosis. Aortic valve peak gradient measures 5.0 mmHg.  Pulmonic Valve: The pulmonic valve was normal in structure. Pulmonic valve regurgitation is not visualized. No evidence of pulmonic stenosis.  Aorta: The aortic root is normal in size and structure.  Venous: The inferior vena cava is dilated in size with less than 50% respiratory variability, suggesting right atrial pressure of 15 mmHg.  IAS/Shunts: No atrial level shunt detected by color flow Doppler.   LEFT VENTRICLE PLAX 2D LVIDd:         4.50 cm   Diastology LVIDs:         2.90 cm   LV e' medial:    4.33 cm/s LV PW:         1.10 cm   LV E/e' medial:  21.4 LV IVS:        1.10 cm   LV e' lateral:   6.98 cm/s LVOT diam:     2.00 cm   LV E/e' lateral: 13.3 LV SV:         68 LV SV Index:   39 LVOT Area:     3.14 cm   RIGHT VENTRICLE            IVC RV Basal diam:  2.70 cm    IVC diam: 2.40 cm RV S prime:     8.72 cm/s TAPSE (M-mode): 1.6 cm  LEFT ATRIUM             Index  RIGHT ATRIUM           Index LA diam:        5.00 cm 2.86 cm/m   RA Area:     15.30 cm LA Vol (A2C):   90.4 ml 51.67 ml/m  RA Volume:   38.70 ml  22.12 ml/m LA Vol (A4C):   84.5 ml 48.30 ml/m LA Biplane Vol: 88.2 ml 50.42 ml/m AORTIC VALVE                 PULMONIC VALVE AV Area (Vmax): 2.47 cm     PV Vmax:       0.75 m/s AV Vmax:        111.50 cm/s  PV Peak grad:  2.3 mmHg AV Peak Grad:   5.0 mmHg LVOT Vmax:      87.50 cm/s LVOT Vmean:     55.500 cm/s LVOT VTI:       0.216 m  AORTA Ao Root diam: 3.50 cm Ao Asc diam:  3.30 cm  MITRAL VALVE MV Area (PHT): 3.60 cm    SHUNTS MV Decel Time: 211 msec    Systemic VTI:  0.22 m MV E velocity: 92.50 cm/s  Systemic Diam: 2.00 cm MV A velocity: 29.10 cm/s MV E/A ratio:  3.18  Fransico Him MD Electronically signed by Fransico Him MD Signature Date/Time: 02/02/2021/9:17:55 AM    Final     MONITORS  CARDIAC EVENT MONITOR 06/12/2020  Narrative  Normal sinus rhythm  Rare 4 beat VT salvo  SVT salvos up to 20 beats  No atrial fibrillation or sustained arrhythmia.           Other studies Reviewed: Review of the additional studies/records demonstrates: Review of imaging data available demonstrates no evidence of aortic atherosclerosis  Recent Labs: 03/28/2021: ALT 16; BNP 321.8 06/03/2021: Hemoglobin 13.1; Platelets 173 06/06/2021: BUN 23; Creatinine, Ser 1.24; Potassium 4.4; Sodium 138   Recent Lipid Panel Lab Results  Component Value Date/Time   CHOL 106 02/02/2021 03:58 AM   CHOL 136 03/30/2019 12:34 PM   TRIG 69 02/02/2021 03:58 AM   HDL 45 02/02/2021 03:58 AM   HDL 36 (L) 03/30/2019 12:34 PM   LDLCALC 47 02/02/2021 03:58 AM   LDLCALC 70 03/30/2019 12:34 PM    Risk Assessment/Calculations:                Physical Exam:    VS:  BP 100/60   Pulse (!) 49   Ht 5\' 7"  (1.702 m)   Wt 146 lb 12.8 oz (66.6 kg)   SpO2 98%   BMI 22.99 kg/m    Wt Readings from Last 3 Encounters:  03/27/22 146 lb 12.8 oz (66.6 kg)  09/25/21 139 lb 6.4 oz (63.2 kg)  06/21/21 142 lb 12.8 oz (64.8 kg)    GENERAL:  No apparent distress, AOx3 HEENT:  No carotid bruits, +2 carotid impulses, no scleral icterus CAR: RRR no murmurs, gallops, rubs, or thrills RES:  Clear to auscultation bilaterally ABD:  Soft, nontender, nondistended, positive bowel sounds x 4 VASC:  +2 radial pulses, +2 carotid pulses, palpable pedal pulses NEURO:  CN 2-12 grossly intact; motor and sensory grossly intact PSYCH:  No active depression or anxiety EXT:  No edema, ecchymosis, or cyanosis  Signed, Early Osmond, MD  03/27/2022 10:50 AM    [address] [place], [place], [place]  [address] Phone: [phone]; Fax: [phone]   Note:  This document was prepared  using Systems analyst and may include unintentional dictation errors.

## 2022-03-27 ENCOUNTER — Encounter: Payer: Self-pay | Admitting: Internal Medicine

## 2022-03-27 ENCOUNTER — Ambulatory Visit: Payer: Managed Care, Other (non HMO) | Attending: Internal Medicine | Admitting: Internal Medicine

## 2022-03-27 VITALS — BP 100/60 | HR 49 | Ht 67.0 in | Wt 146.8 lb

## 2022-03-27 DIAGNOSIS — N182 Chronic kidney disease, stage 2 (mild): Secondary | ICD-10-CM | POA: Insufficient documentation

## 2022-03-27 DIAGNOSIS — I25119 Atherosclerotic heart disease of native coronary artery with unspecified angina pectoris: Secondary | ICD-10-CM | POA: Diagnosis not present

## 2022-03-27 DIAGNOSIS — E1159 Type 2 diabetes mellitus with other circulatory complications: Secondary | ICD-10-CM | POA: Diagnosis not present

## 2022-03-27 DIAGNOSIS — E785 Hyperlipidemia, unspecified: Secondary | ICD-10-CM | POA: Diagnosis present

## 2022-03-27 DIAGNOSIS — I152 Hypertension secondary to endocrine disorders: Secondary | ICD-10-CM | POA: Diagnosis present

## 2022-03-27 DIAGNOSIS — Z01810 Encounter for preprocedural cardiovascular examination: Secondary | ICD-10-CM | POA: Insufficient documentation

## 2022-03-27 DIAGNOSIS — E1169 Type 2 diabetes mellitus with other specified complication: Secondary | ICD-10-CM | POA: Diagnosis not present

## 2022-03-27 DIAGNOSIS — E118 Type 2 diabetes mellitus with unspecified complications: Secondary | ICD-10-CM | POA: Diagnosis not present

## 2022-03-27 DIAGNOSIS — Z794 Long term (current) use of insulin: Secondary | ICD-10-CM | POA: Insufficient documentation

## 2022-03-27 MED ORDER — LOSARTAN POTASSIUM 25 MG PO TABS: 25.0000 mg | ORAL_TABLET | Freq: Every day | ORAL | 3 refills | Status: DC

## 2022-03-27 NOTE — Patient Instructions (Signed)
Medication Instructions:  Your physician has recommended you make the following change in your medication:  1.) stop amlodipine 2.) start losartan 25 mg - one tablet daily  *If you need a refill on your cardiac medications before your next appointment, please call your pharmacy*   Lab Work: Please return in about 7-10 days for blood work (cmet, lipids)  If you have labs (blood work) drawn today and your tests are completely normal, you will receive your results only by: [place] (if you have MyChart) OR A paper copy in the mail If you have any lab test that is abnormal or we need to change your treatment, we will call you to review the results.   Testing/Procedures: none   Follow-Up: At [facility], you and your health needs are our priority.  As part of our continuing mission to provide you with exceptional heart care, we have created designated Provider Care Teams.  These Care Teams include your primary Cardiologist (physician) and Advanced Practice Providers (APPs -  Physician Assistants and Nurse Practitioners) who all work together to provide you with the care you need, when you need it.   Your next appointment:   6 month(s)  Provider:   Early Osmond, MD

## 2022-03-28 ENCOUNTER — Other Ambulatory Visit: Payer: Self-pay | Admitting: Neurological Surgery

## 2022-03-29 ENCOUNTER — Other Ambulatory Visit: Payer: Self-pay | Admitting: Neurological Surgery

## 2022-04-03 ENCOUNTER — Telehealth: Payer: Self-pay | Admitting: Internal Medicine

## 2022-04-03 NOTE — Telephone Encounter (Signed)
Returned call to patient.  He asks if our office can include a melinnial drug test.  He is not sure what that means.  He has paperwork but not w him.  I asked him to take it into the [facility] in the first floor of our building to see if they can draw for him/PCP.

## 2022-04-03 NOTE — Telephone Encounter (Signed)
New Message:      Patient said he needs to talk to Dr Dara Hoyer nurse. This is concerning a lab test, that his primary doctor wants him to have.

## 2022-04-04 NOTE — Pre-Procedure Instructions (Signed)
Surgical Instructions    Your procedure is scheduled on Friday, April 5th.  Report to [facility] Main Entrance "A" at 05:30 A.M., then check in with the Admitting office.  Call this number if you have problems the morning of surgery:  [phone]  If you have any questions prior to your surgery date call [phone]: Open Monday-Friday 8am-4pm If you experience any cold or flu symptoms such as cough, fever, chills, shortness of breath, etc. between now and your scheduled surgery, please notify us at the above number.     Remember:  Do not eat or drink after midnight the night before your surgery     Take these medicines the morning of surgery with A SIP OF WATER  gabapentin (NEURONTIN)  isosorbide mononitrate (IMDUR)  metoprolol (TOPROL XL)  pantoprazole (PROTONIX)  ranolazine (RANEXA)  tiZANidine (ZANAFLEX)    If needed: nitroGLYCERIN (NITROSTAT)  oxyCODONE-acetaminophen (PERCOCET)  Polyethylene Glycol 400 (BLINK TEARS)  promethazine (PHENERGAN)    Follow your surgeon's instructions on when to stop Aspirin and Effient.  If no instructions were given by your surgeon then you will need to call the office to get those instructions.     As of today, STOP taking any Aleve, Naproxen, Ibuprofen, Motrin, Advil, Goody's, BC's, all herbal medications, fish oil, and all vitamins.   WHAT DO I DO ABOUT MY DIABETES MEDICATION?  THE NIGHT BEFORE SURGERY, take 18 units (50%) of TRESIBA FLEXTOUCH.     THE MORNING OF SURGERY, take 18 units (50%) of [place].  Hold dapagliflozin propanediol (FARXIGA) 72 hours prior to surgery. Last dose 4/1.   HOW TO MANAGE YOUR DIABETES BEFORE AND AFTER SURGERY  Why is it important to control my blood sugar before and after surgery? Improving blood sugar levels before and after surgery helps healing and can limit problems. A way of improving blood sugar control is eating a healthy diet by:  Eating less sugar and carbohydrates   Increasing activity/exercise  Talking with your doctor about reaching your blood sugar goals High blood sugars (greater than 180 mg/dL) can raise your risk of infections and slow your recovery, so you will need to focus on controlling your diabetes during the weeks before surgery. Make sure that the doctor who takes care of your diabetes knows about your planned surgery including the date and location.  How do I manage my blood sugar before surgery? Check your blood sugar at least 4 times a day, starting 2 days before surgery, to make sure that the level is not too high or low.  Check your blood sugar the morning of your surgery when you wake up and every 2 hours until you get to the Short Stay unit.  If your blood sugar is less than 70 mg/dL, you will need to treat for low blood sugar: Do not take insulin. Treat a low blood sugar (less than 70 mg/dL) with  cup of clear juice (cranberry or apple), 4 glucose tablets, OR glucose gel. Recheck blood sugar in 15 minutes after treatment (to make sure it is greater than 70 mg/dL). If your blood sugar is not greater than 70 mg/dL on recheck, call [phone] for further instructions. Report your blood sugar to the short stay nurse when you get to Short Stay.  If you are admitted to the hospital after surgery: Your blood sugar will be checked by the staff and you will probably be given insulin after surgery (instead of oral diabetes medicines) to make sure you have good blood sugar  levels. The goal for blood sugar control after surgery is 80-180 mg/dL.                     Do NOT Smoke (Tobacco/Vaping) for 24 hours prior to your procedure.  If you use a CPAP at night, you may bring your mask/headgear for your overnight stay.   Contacts, glasses, piercing's, hearing aid's, dentures or partials may not be worn into surgery, please bring cases for these belongings.    For patients admitted to the hospital, discharge time will be determined by your  treatment team.   Patients discharged the day of surgery will not be allowed to drive home, and someone needs to stay with them for 24 hours.  SURGICAL WAITING ROOM VISITATION Patients having surgery or a procedure may have no more than 2 support people in the waiting area - these visitors may rotate.   Children under the age of 92 must have an adult with them who is not the patient. If the patient needs to stay at the hospital during part of their recovery, the visitor guidelines for inpatient rooms apply. Pre-op nurse will coordinate an appropriate time for 1 support person to accompany patient in pre-op.  This support person may not rotate.   Please refer to the [facility] website for the visitor guidelines for Inpatients (after your surgery is over and you are in a regular room).    Special instructions:   [facility]- Preparing For Surgery  Before surgery, you can play an important role. Because skin is not sterile, your skin needs to be as free of germs as possible. You can reduce the number of germs on your skin by washing with CHG (chlorahexidine gluconate) Soap before surgery.  CHG is an antiseptic cleaner which kills germs and bonds with the skin to continue killing germs even after washing.    Oral Hygiene is also important to reduce your risk of infection.  Remember - BRUSH YOUR TEETH THE MORNING OF SURGERY WITH YOUR REGULAR TOOTHPASTE  Please do not use if you have an allergy to CHG or antibacterial soaps. If your skin becomes reddened/irritated stop using the CHG.  Do not shave (including legs and underarms) for at least 48 hours prior to first CHG shower. It is OK to shave your face.  Please follow these instructions carefully.   Shower the NIGHT BEFORE SURGERY and the MORNING OF SURGERY  If you chose to wash your hair, wash your hair first as usual with your normal shampoo.  After you shampoo, rinse your hair and body thoroughly to remove the shampoo.  Use CHG Soap as  you would any other liquid soap. You can apply CHG directly to the skin and wash gently with a scrungie or a clean washcloth.   Apply the CHG Soap to your body ONLY FROM THE NECK DOWN.  Do not use on open wounds or open sores. Avoid contact with your eyes, ears, mouth and genitals (private parts). Wash Face and genitals (private parts)  with your normal soap.   Wash thoroughly, paying special attention to the area where your surgery will be performed.  Thoroughly rinse your body with warm water from the neck down.  DO NOT shower/wash with your normal soap after using and rinsing off the CHG Soap.  Pat yourself dry with a CLEAN TOWEL.  Wear CLEAN PAJAMAS to bed the night before surgery  Place CLEAN SHEETS on your bed the night before your surgery  DO NOT  SLEEP WITH PETS.   Day of Surgery: Take a shower with CHG soap. Do not wear jewelry  Do not wear lotions, powders, colognes, or deodorant. Men may shave face and neck. Do not bring valuables to the hospital. [facility] is not responsible for any belongings or valuables.  Wear Clean/Comfortable clothing the morning of surgery Remember to brush your teeth WITH YOUR REGULAR TOOTHPASTE.   Please read over the following fact sheets that you were given.    If you received a COVID test during your pre-op visit  it is requested that you wear a mask when out in public, stay away from anyone that may not be feeling well and notify your surgeon if you develop symptoms. If you have been in contact with anyone that has tested positive in the last 10 days please notify you surgeon.

## 2022-04-05 ENCOUNTER — Encounter (HOSPITAL_COMMUNITY): Payer: Self-pay

## 2022-04-05 ENCOUNTER — Other Ambulatory Visit: Payer: Self-pay

## 2022-04-05 ENCOUNTER — Ambulatory Visit: Payer: Managed Care, Other (non HMO)

## 2022-04-05 ENCOUNTER — Encounter
Admission: RE | Admit: 2022-04-05 | Discharge: 2022-04-05 | Disposition: A | Discharge status: Home / Self Care | Payer: Managed Care, Other (non HMO) | Source: Ambulatory Visit | Attending: Internal Medicine | Admitting: Internal Medicine

## 2022-04-05 VITALS — BP 115/62 | HR 61 | Temp 98.2°F | Resp 18 | Ht 67.0 in | Wt 144.2 lb

## 2022-04-05 DIAGNOSIS — E119 Type 2 diabetes mellitus without complications: Secondary | ICD-10-CM | POA: Diagnosis not present

## 2022-04-05 DIAGNOSIS — Z01812 Encounter for preprocedural laboratory examination: Secondary | ICD-10-CM | POA: Diagnosis present

## 2022-04-05 DIAGNOSIS — E785 Hyperlipidemia, unspecified: Secondary | ICD-10-CM | POA: Diagnosis not present

## 2022-04-05 DIAGNOSIS — Z01818 Encounter for other preprocedural examination: Secondary | ICD-10-CM

## 2022-04-05 DIAGNOSIS — E1169 Type 2 diabetes mellitus with other specified complication: Secondary | ICD-10-CM | POA: Insufficient documentation

## 2022-04-05 DIAGNOSIS — I25119 Atherosclerotic heart disease of native coronary artery with unspecified angina pectoris: Secondary | ICD-10-CM

## 2022-04-05 HISTORY — DX: Gastro-esophageal reflux disease without esophagitis: K21.9

## 2022-04-05 HISTORY — DX: Personal history of urinary calculi: Z87.442

## 2022-04-05 HISTORY — DX: Acute myocardial infarction, unspecified: I21.9

## 2022-04-05 LAB — BASIC METABOLIC PANEL
Anion gap: 16 — ABNORMAL HIGH (ref 5–15)
BUN: 22 mg/dL (ref 8–23)
CO2: 26 mmol/L (ref 22–32)
Calcium: 9.7 mg/dL (ref 8.9–10.3)
Chloride: 96 mmol/L — ABNORMAL LOW (ref 98–111)
Creatinine, Ser: 1.31 mg/dL — ABNORMAL HIGH (ref 0.61–1.24)
GFR, Estimated: >60 mL/min (ref >60)
Glucose, Bld: 161 mg/dL — ABNORMAL HIGH (ref 70–99)
Potassium: 4.8 mmol/L (ref 3.5–5.1)
Sodium: 138 mmol/L (ref 135–145)

## 2022-04-05 LAB — GLUCOSE, CAPILLARY: Glucose-Capillary: 164 mg/dL — ABNORMAL HIGH (ref 70–99)

## 2022-04-05 LAB — SURGICAL PCR SCREEN
MRSA, PCR: NEGATIVE
Staphylococcus aureus: NEGATIVE

## 2022-04-05 LAB — CBC
HCT: 42.5 % (ref 39.0–52.0)
Hemoglobin: 14 g/dL (ref 13.0–17.0)
MCH: 32.6 pg (ref 26.0–34.0)
MCHC: 32.9 g/dL (ref 30.0–36.0)
MCV: 98.8 fL (ref 80.0–100.0)
Platelets: 172 10*3/uL (ref 150–400)
RBC: 4.3 MIL/uL (ref 4.22–5.81)
RDW: 13.2 % (ref 11.5–15.5)
WBC: 8.6 10*3/uL (ref 4.0–10.5)
nRBC: 0 % (ref 0.0–0.2)

## 2022-04-05 NOTE — Progress Notes (Signed)
PCP - Mila Palmer, PA-C Cardiologist - Dr. Lenna Sciara  PPM/ICD - denies   Chest x-ray - 09/29/20 EKG - 03/27/22 Stress Test - denies ECHO - 02/02/21 Cardiac Cath - 06/02/21  Sleep Study - denies   Fasting Blood Sugar - 80-100 Checks Blood Sugar continuously (has sensor)  Last dose of GLP1 agonist-  n/a   ASA/Blood Thinner Instructions: Hold ASA and Effient 5 days prior to surgery   ERAS Protcol - no, NPO   COVID TEST- n/a   Anesthesia review: yes, cardiac hx  Patient denies shortness of breath, fever, cough and chest pain at PAT appointment   All instructions explained to the patient, with a verbal understanding of the material. Patient agrees to go over the instructions while at home for a better understanding. The opportunity to ask questions was provided.

## 2022-04-06 LAB — COMPREHENSIVE METABOLIC PANEL
ALT: 12 IU/L (ref 0–44)
AST: 19 IU/L (ref 0–40)
Albumin/Globulin Ratio: 2 (ref 1.2–2.2)
Albumin: 5 g/dL — ABNORMAL HIGH (ref 3.9–4.9)
Alkaline Phosphatase: 92 IU/L (ref 44–121)
BUN/Creatinine Ratio: 15 (ref 10–24)
BUN: 22 mg/dL (ref 8–27)
Bilirubin Total: 0.7 mg/dL (ref 0.0–1.2)
CO2: 22 mmol/L (ref 20–29)
Calcium: 9.7 mg/dL (ref 8.6–10.2)
Chloride: 97 mmol/L (ref 96–106)
Creatinine, Ser: 1.49 mg/dL — ABNORMAL HIGH (ref 0.76–1.27)
Globulin, Total: 2.5 g/dL (ref 1.5–4.5)
Glucose: 102 mg/dL — ABNORMAL HIGH (ref 70–99)
Potassium: 4.7 mmol/L (ref 3.5–5.2)
Sodium: 140 mmol/L (ref 134–144)
Total Protein: 7.5 g/dL (ref 6.0–8.5)
eGFR: 52 mL/min/{1.73_m2} — ABNORMAL LOW (ref >59)

## 2022-04-06 LAB — HEMOGLOBIN A1C
Hgb A1c MFr Bld: 6.2 % — ABNORMAL HIGH (ref 4.8–5.6)
Mean Plasma Glucose: 131 mg/dL

## 2022-04-06 LAB — LIPID PANEL
Chol/HDL Ratio: 2.2 ratio (ref 0.0–5.0)
Cholesterol, Total: 141 mg/dL (ref 100–199)
HDL: 64 mg/dL (ref >39)
LDL Chol Calc (NIH): 61 mg/dL (ref 0–99)
Triglycerides: 81 mg/dL (ref 0–149)
VLDL Cholesterol Cal: 16 mg/dL (ref 5–40)

## 2022-04-06 NOTE — Progress Notes (Signed)
Anesthesia Chart Review:  Case: [id number] Date/Time: 04/13/22 0715   Procedure: OPENLUMBAR LAMINECTOMY AND FORAMINOTOMY - 3C   Anesthesia type: General   Pre-op diagnosis: SPONDYLOSIS WITH RADICULOPATHY, LUMBAR REGION   Location: [facility] OR ROOM 21 / [place] OR   Surgeons: Dawley, Theodoro Doing, DO       DISCUSSION: Patient is a 18-year male scheduled for the above procedure.  History includes never smoker, CAD/MI (s/p CABG: LIMA-LAD, SVG-PDA, SVG-OM1-OM2 2002; DES LAD 12/2010; PTCA LAD 06/2011; DES LAD 06/2012; Angiosculpt PDA 07/2013, DES PDA 09/2013; RCA & LAD PTCA 04/16/17; s/p PTCA & lithotripsy LAD 04/27/19; PTCA distal LAD-1 & LAD-2 11/02/21), PAF (diagnosed ~ 03/2014; ablation 05/06/15), chronic HFpEF, HTN, HLD, DM2, CKD (stage II), pernicious anemia, GERD, spinal surgery (L1-2 microdiscectomy 02/03/16; L3-4 microdiscectomy 07/10/17 & redo with L4 kyphoplasty 01/14/18).  He had cardiology follow-up and preoperative evaluation on 03/27/22 by Dr. Ali Lowe. He has had multiple coronary interventions and summarizes above. He noted, "I reviewed the patient's most recent angiogram.  He has undilatable stents with multiple layers of stent.  He is currently on prasugrel.  I think his options are limited in regards to any revascularization procedure with perhaps the only other thing being intravascular lithotripsy.  For now continue Toprol, ranolazine, and Imdur." In regards to preoperative input, Dr. Ali Lowe wrote: "Preoperative cardiovascular examination: He can do 4 METS of activity without chest pain.  He has multiple layers of stent and underwent balloon angioplasty in May 2023.  He is at low risk for cardiovascular complications during noncardiac surgery.  He may hold his dual antiplatelet therapy for 5 days prior to the procedure and then resume as soon as possible." He reported instructions to hold aspirin Effient 5 days prior to surgery.   A1c 6.2%. He is on [place]. He wears a continuous blood glucose  monitor.  Anesthesia team to evaluate on the day of surgery.    VS: BP 115/62   Pulse 61   Temp 36.8 C (Oral)   Resp 18   Ht 5\' 7"  (1.702 m)   Wt 65.4 kg   SpO2 100%   BMI 22.58 kg/m    PROVIDERS: Mila Palmer, PA-C is PCP Lenna Sciara, MD is cardiologist, previously he saw Daneen Schick, MD until his recent retirement.    LABS: Labs reviewed: Acceptable for surgery. Cr 1.31, appears consistent with previous results (Cr 1.24-1.49 when compared to [facility] labs since 06/06/21).  (all labs ordered are listed, but only abnormal results are displayed)  Labs Reviewed  GLUCOSE, CAPILLARY - Abnormal; Notable for the following components:      Result Value   Glucose-Capillary 164 (*)    All other components within normal limits  HEMOGLOBIN A1C - Abnormal; Notable for the following components:   Hgb A1c MFr Bld 6.2 (*)    All other components within normal limits  BASIC METABOLIC PANEL - Abnormal; Notable for the following components:   Chloride 96 (*)    Glucose, Bld 161 (*)    Creatinine, Ser 1.31 (*)    Anion gap 16 (*)    All other components within normal limits  SURGICAL PCR SCREEN  CBC     IMAGES: MRI L-spine 03/07/22 ([facility]/PACS): IMPRESSION: 1. At L3-L4, moderate canal stenosis with right greater than left subarticular recess stenosis and moderate bilateral foraminal stenosis. 2. At L2-L3, mild-to-moderate canal stenosis with moderate right and mild left foraminal stenosis. 3. At L4-L5, mild bilateral foraminal stenosis. 4. L5-S1 PLIF with patent canal and foramina.  5. Prior L4 compression fracture without marrow edema.  MRA Neck 08/10/16 ([facility]): CONCLUSION:  NO EVIDENCE OF HEMODYNAMICALLY SIGNIFICANT CERVICAL CAROTID OR  VERTEBRAL ARTERY STENOSIS WITHIN THE LIMITATIONS OF UNENHANCED  CERVICAL MRA.    EKG: 3/19/243:  Sinus bradycardia at 49 bpm Cannot rule out inferior infarct, age undetermined   CV: Cardiac cath 06/02/21: Left Anterior Descending   Ost LAD to Prox LAD lesion is 40% stenosed.  Prox LAD to Mid LAD lesion is 40% stenosed with 40% stenosed side branch in 2nd Diag. The lesion was previously treated .  Dist LAD-1 lesion is 95% stenosed. Vessel is the culprit lesion. The lesion is type C. The lesion was previously treated .  Dist LAD-2 lesion is 95% stenosed. Vessel is the culprit lesion. The lesion is tubular. The lesion was previously treated .  Dist LAD-3 lesion is 50% stenosed.    Left Circumflex  Vessel is small.    First Obtuse Marginal Branch  1st Mrg lesion is 90% stenosed.    Second Obtuse Marginal Branch  2nd Mrg lesion is 25% stenosed.    Third Obtuse Marginal Branch  Vessel is small in size.    Right Coronary Artery  There is moderate diffuse disease throughout the vessel.  Mid RCA to Dist RCA lesion is 70% stenosed.  Dist RCA lesion is 20% stenosed. The lesion was previously treated .    Right Posterior Descending Artery  There is moderate disease in the vessel.  RPDA lesion is 20% stenosed. The lesion was previously treated .    First Right Posterolateral Branch  Vessel is small in size.    Sequential Graft To 1st Mrg, 2nd Mrg    LIMA Graft To Mid LAD  And is small.  Mid Graft to Insertion lesion is 100% stenosed.     CONCLUSIONS: Diffuse in-stent restenosis in the distal third of the LAD for mild jacket with 95% stenosis reduced to 75% using the score flex cutting balloon, 15 x 2.5 mm in diameter.  Maintained TIMI grade III flow. Proximal to ostial LAD 50 to 70% with RFR 0.96. Circumflex is patent. Sequential saphenous vein graft to obtuse marginals is patent. Native right coronary contains distal eccentric 70% stenosis and widely patent distal RCA to PDA stent Left main is short and widely patent Mild mid to apical anterior wall hypokinesis.  EF 55% with normal LVEDP.   RECOMMENDATIONS: Continue risk factor modification. Continue anti-ischemic therapy.   Echo 02/02/21: IMPRESSIONS    1. Left ventricular ejection fraction, by estimation, is 60 to 65%. The  left ventricle has normal function. The left ventricle has no regional  wall motion abnormalities. There is mild left ventricular hypertrophy of  the basal-septal segment. Left  ventricular diastolic parameters are consistent with Grade III diastolic  dysfunction (restrictive). Elevated left ventricular end-diastolic  pressure.   2. Right ventricular systolic function is normal. The right ventricular  size is normal. Tricuspid regurgitation signal is inadequate for assessing  PA pressure.   3. Left atrial size was severely dilated.   4. The mitral valve is degenerative. Mild mitral valve regurgitation. No  evidence of mitral stenosis.   5. The aortic valve is tricuspid. Aortic valve regurgitation is not  visualized. Aortic valve sclerosis is present, with no evidence of aortic  valve stenosis. Aortic valve Vmax measures 1.12 m/s.   6. The inferior vena cava is dilated in size with <50% respiratory  variability, suggesting right atrial pressure of 15 mmHg.    Cardiac  event monitor 04/28/20- 05/27/20: Normal sinus rhythm Rare 4 beat VT salvo SVT salvos up to 20 beats No atrial fibrillation or sustained arrhythmia.     Past Medical History:  Diagnosis Date   Anxiety state    pt denies   Body mass index (bmi) 26.0-26.9, adult    CAD (coronary artery disease)    a. 2002 s/p CABG x 3 (LIMA->LAD, VG->OM1->OM2); b. s/p prior LAD and dRCA/RPDA stenting; c. 04/2019 PTCA & lithotripsy of ISR throughout LAD; d. 08/2020 PCI/scoring balloon PTCA throughout LAD 2/2 ISR; e. 09/2020 Cath: LM nl, LAD 40ost/p, 40p/d, 75d ISR (FFR 0.86), 50d, LCX small, OM1 90, OM2 25, RCA 20d ISR, RPDA 20 ISR, VG->OM1->OM2 ok, LIMA->LAD 126m-->Med rx.   Chronic heart failure with preserved ejection fraction (HFpEF) ([place])    a. 09/2020 Echo: Ef 60-65%, no rwma, nl RV size/fxn. Mod dil LA. triv MR. Mild AoV sclerosis w/o stenosis.   CKD (chronic  kidney disease), stage II    DDD (degenerative disc disease), lumbar    DM (diabetes mellitus) (HCC)    Gastritis    GERD (gastroesophageal reflux disease)    Headache    Hemorrhage    History of kidney stones    Hyperlipidemia    Hypertension    Kidney stones    Lumbago    Myocardial infarction [facility])    Neck pain    Paroxysmal A-fib ([place])    a. CHA2DS2VASc = 4-->Eliquis; b. 06/2020 Zio: No afib. Up to 20 beats PSVT.   Pernicious anemia    Unstable angina (HCC)     Past Surgical History:  Procedure Laterality Date   ABLATION  2017   CARDIAC CATHETERIZATION     CORONARY ARTERY BYPASS GRAFT     CORONARY BALLOON ANGIOPLASTY N/A 04/27/2019   Procedure: CORONARY BALLOON ANGIOPLASTY;  Surgeon: Nelva Bush, MD;  Location: [place] CV LAB;  Service: Cardiovascular;  Laterality: N/A;   CORONARY BALLOON ANGIOPLASTY N/A 12/16/2019   Procedure: CORONARY BALLOON ANGIOPLASTY;  Surgeon: Belva Crome, MD;  Location: [place] CV LAB;  Service: Cardiovascular;  Laterality: N/A;   CORONARY BALLOON ANGIOPLASTY N/A 09/06/2020   Procedure: CORONARY BALLOON ANGIOPLASTY;  Surgeon: Belva Crome, MD;  Location: [place] CV LAB;  Service: Cardiovascular;  Laterality: N/A;   CORONARY BALLOON ANGIOPLASTY N/A 06/02/2021   Procedure: CORONARY BALLOON ANGIOPLASTY;  Surgeon: Belva Crome, MD;  Location: [place] CV LAB;  Service: Cardiovascular;  Laterality: N/A;   INTRAVASCULAR PRESSURE WIRE/FFR STUDY N/A 09/30/2020   Procedure: INTRAVASCULAR PRESSURE WIRE/FFR STUDY;  Surgeon: Early Osmond, MD;  Location: [place] CV LAB;  Service: Cardiovascular;  Laterality: N/A;   INTRAVASCULAR PRESSURE WIRE/FFR STUDY N/A 06/02/2021   Procedure: INTRAVASCULAR PRESSURE WIRE/FFR STUDY;  Surgeon: Belva Crome, MD;  Location: [place] CV LAB;  Service: Cardiovascular;  Laterality: N/A;   INTRAVASCULAR ULTRASOUND/IVUS N/A 04/27/2019   Procedure: Intravascular Ultrasound/IVUS;  Surgeon: Nelva Bush, MD;  Location: [place] CV LAB;  Service: Cardiovascular;  Laterality: N/A;   INTRAVASCULAR ULTRASOUND/IVUS N/A 12/16/2019   Procedure: Intravascular Ultrasound/IVUS;  Surgeon: Belva Crome, MD;  Location: [place] CV LAB;  Service: Cardiovascular;  Laterality: N/A;   KNEE ARTHROSCOPY Bilateral    3 times on each knee   LEFT HEART CATH AND CORS/GRAFTS ANGIOGRAPHY N/A 04/09/2019   Procedure: LEFT HEART CATH AND CORS/GRAFTS ANGIOGRAPHY;  Surgeon: Belva Crome, MD;  Location: [place] CV LAB;  Service: Cardiovascular;  Laterality: N/A;   LEFT HEART  CATH AND CORS/GRAFTS ANGIOGRAPHY N/A 12/16/2019   Procedure: LEFT HEART CATH AND CORS/GRAFTS ANGIOGRAPHY;  Surgeon: Belva Crome, MD;  Location: [place] CV LAB;  Service: Cardiovascular;  Laterality: N/A;   LEFT HEART CATH AND CORS/GRAFTS ANGIOGRAPHY N/A 09/06/2020   Procedure: LEFT HEART CATH AND CORS/GRAFTS ANGIOGRAPHY;  Surgeon: Belva Crome, MD;  Location: [place] CV LAB;  Service: Cardiovascular;  Laterality: N/A;   LEFT HEART CATH AND CORS/GRAFTS ANGIOGRAPHY N/A 09/30/2020   Procedure: LEFT HEART CATH AND CORS/GRAFTS ANGIOGRAPHY;  Surgeon: Early Osmond, MD;  Location: [place] CV LAB;  Service: Cardiovascular;  Laterality: N/A;   LEFT HEART CATH AND CORS/GRAFTS ANGIOGRAPHY N/A 06/02/2021   Procedure: LEFT HEART CATH AND CORS/GRAFTS ANGIOGRAPHY;  Surgeon: Belva Crome, MD;  Location: [place] CV LAB;  Service: Cardiovascular;  Laterality: N/A;   LUMBAR FUSION     L5-S1   LUMBAR PERITONEAL SHUNT     pt states "this is blocked"   SHOULDER ARTHROSCOPY Bilateral     MEDICATIONS:  aspirin 81 MG EC tablet   atorvastatin (LIPITOR) 80 MG tablet   Blood Glucose Monitoring Suppl (CONTOUR NEXT MONITOR) w/Device KIT   dapagliflozin propanediol (FARXIGA) 10 MG TABS tablet   furosemide (LASIX) 40 MG tablet   gabapentin (NEURONTIN) 300 MG capsule   glucose blood (CONTOUR NEXT TEST) test strip   Insulin Pen  Needle (BD PEN NEEDLE NANO U/F) 32G X 4 MM MISC   isosorbide mononitrate (IMDUR) 60 MG 24 hr tablet   losartan (COZAAR) 25 MG tablet   magnesium oxide (MAG-OX) 400 MG tablet   metoprolol (TOPROL XL) 200 MG 24 hr tablet   nitroGLYCERIN (NITROSTAT) 0.4 MG SL tablet   oxyCODONE-acetaminophen (PERCOCET) 10-325 MG tablet   pantoprazole (PROTONIX) 40 MG tablet   Polyethylene Glycol 400 (BLINK TEARS) 0.25 % SOLN   potassium chloride (KLOR-CON M) 10 MEQ tablet   prasugrel (EFFIENT) 10 MG TABS tablet   promethazine (PHENERGAN) 25 MG tablet   ranolazine (RANEXA) 500 MG 12 hr tablet   spironolactone (ALDACTONE) 25 MG tablet   tiZANidine (ZANAFLEX) 4 MG tablet   traZODone (DESYREL) 50 MG tablet   TRESIBA FLEXTOUCH 100 UNIT/ML FlexTouch Pen   No current facility-administered medications for this encounter.    Myra Gianotti, PA-C Surgical Short Stay/Anesthesiology [facility] Phone [phone] [facility] Phone [phone] 04/06/2022 2:18 PM

## 2022-04-06 NOTE — Anesthesia Preprocedure Evaluation (Addendum)
Anesthesia Evaluation  Patient identified by MRN, date of birth, ID band Patient awake    Reviewed: Allergy & Precautions, NPO status , Patient's Chart, lab work & pertinent test results, reviewed documented beta blocker date and time   Airway Mallampati: II  TM Distance: >3 FB Neck ROM: Full    Dental  (+) Teeth Intact, Dental Advisory Given, Caps   Pulmonary neg pulmonary ROS   Pulmonary exam normal breath sounds clear to auscultation       Cardiovascular hypertension, Pt. on medications and Pt. on home beta blockers (-) angina + CAD, + Past MI, + Cardiac Stents and + CABG  Normal cardiovascular exam+ dysrhythmias Atrial Fibrillation  Rhythm:Regular Rate:Normal     Neuro/Psych  Headaches PSYCHIATRIC DISORDERS Anxiety     PONDYLOSIS WITH RADICULOPATHY, LUMBAR REGION    GI/Hepatic Neg liver ROS,GERD  Medicated,,  Endo/Other  diabetes, Type 2, Oral Hypoglycemic Agents    Renal/GU Renal InsufficiencyRenal disease     Musculoskeletal  (+) Arthritis ,    Abdominal   Peds  Hematology  (+) Blood dyscrasia (Effient)   Anesthesia Other Findings   Reproductive/Obstetrics                             Anesthesia Physical Anesthesia Plan  ASA: 3  Anesthesia Plan: General   Post-op Pain Management: Tylenol PO (pre-op)*   Induction: Intravenous  PONV Risk Score and Plan: 2 and Midazolam, Dexamethasone and Ondansetron  Airway Management Planned: Oral ETT  Additional Equipment: ClearSight  Intra-op Plan:   Post-operative Plan: Extubation in OR  Informed Consent: I have reviewed the patients History and Physical, chart, labs and discussed the procedure including the risks, benefits and alternatives for the proposed anesthesia with the patient or authorized representative who has indicated his/her understanding and acceptance.     Dental advisory given  Plan Discussed with:  CRNA  Anesthesia Plan Comments: (PAT note written 04/06/2022 by Shonna Chock, PA-C.  He had cardiology follow-up and preoperative evaluation on 03/27/22 by Dr. Lynnette Caffey. He noted, "I reviewed the patient's most recent angiogram.  He has undilatable stents with multiple layers of stent.  He is currently on prasugrel.  I think his options are limited in regards to any revascularization procedure with perhaps the only other thing being intravascular lithotripsy.  For now continue Toprol, ranolazine, and Imdur... Preoperative cardiovascular examination: He can do 4 METS of activity without chest pain.  He has multiple layers of stent and underwent balloon angioplasty in May 2023.  He is at low risk for cardiovascular complications during noncardiac surgery.  He may hold his dual antiplatelet therapy for 5 days prior to the procedure and then resume as soon as possible."  )       Anesthesia Quick Evaluation

## 2022-04-13 ENCOUNTER — Other Ambulatory Visit: Payer: Self-pay

## 2022-04-13 ENCOUNTER — Ambulatory Visit (HOSPITAL_COMMUNITY): Payer: Managed Care, Other (non HMO)

## 2022-04-13 ENCOUNTER — Encounter (HOSPITAL_COMMUNITY)
Admission: RE | Disposition: A | Discharge status: Home / Self Care | Payer: Self-pay | Source: Home / Self Care | Attending: Neurological Surgery

## 2022-04-13 ENCOUNTER — Ambulatory Visit (HOSPITAL_BASED_OUTPATIENT_CLINIC_OR_DEPARTMENT_OTHER): Payer: Managed Care, Other (non HMO) | Admitting: Anesthesiology

## 2022-04-13 ENCOUNTER — Observation Stay (HOSPITAL_COMMUNITY)
Admission: RE | Admit: 2022-04-13 | Discharge: 2022-04-14 | Disposition: A | Discharge status: Home / Self Care | Payer: Managed Care, Other (non HMO) | Attending: Neurological Surgery | Admitting: Neurological Surgery

## 2022-04-13 ENCOUNTER — Ambulatory Visit (HOSPITAL_COMMUNITY): Payer: Managed Care, Other (non HMO) | Admitting: Vascular Surgery

## 2022-04-13 ENCOUNTER — Encounter (HOSPITAL_COMMUNITY): Payer: Self-pay | Admitting: Neurological Surgery

## 2022-04-13 DIAGNOSIS — Z955 Presence of coronary angioplasty implant and graft: Secondary | ICD-10-CM | POA: Diagnosis not present

## 2022-04-13 DIAGNOSIS — Z7982 Long term (current) use of aspirin: Secondary | ICD-10-CM | POA: Diagnosis not present

## 2022-04-13 DIAGNOSIS — M4726 Other spondylosis with radiculopathy, lumbar region: Principal | ICD-10-CM | POA: Insufficient documentation

## 2022-04-13 DIAGNOSIS — N182 Chronic kidney disease, stage 2 (mild): Secondary | ICD-10-CM | POA: Insufficient documentation

## 2022-04-13 DIAGNOSIS — M48062 Spinal stenosis, lumbar region with neurogenic claudication: Secondary | ICD-10-CM

## 2022-04-13 DIAGNOSIS — I480 Paroxysmal atrial fibrillation: Secondary | ICD-10-CM | POA: Insufficient documentation

## 2022-04-13 DIAGNOSIS — I2510 Atherosclerotic heart disease of native coronary artery without angina pectoris: Secondary | ICD-10-CM | POA: Diagnosis not present

## 2022-04-13 DIAGNOSIS — E1122 Type 2 diabetes mellitus with diabetic chronic kidney disease: Secondary | ICD-10-CM | POA: Insufficient documentation

## 2022-04-13 DIAGNOSIS — I10 Essential (primary) hypertension: Secondary | ICD-10-CM

## 2022-04-13 DIAGNOSIS — M5416 Radiculopathy, lumbar region: Secondary | ICD-10-CM | POA: Diagnosis not present

## 2022-04-13 DIAGNOSIS — Z87891 Personal history of nicotine dependence: Secondary | ICD-10-CM | POA: Diagnosis not present

## 2022-04-13 DIAGNOSIS — Z79899 Other long term (current) drug therapy: Secondary | ICD-10-CM | POA: Diagnosis not present

## 2022-04-13 DIAGNOSIS — E119 Type 2 diabetes mellitus without complications: Secondary | ICD-10-CM

## 2022-04-13 DIAGNOSIS — I129 Hypertensive chronic kidney disease with stage 1 through stage 4 chronic kidney disease, or unspecified chronic kidney disease: Secondary | ICD-10-CM | POA: Diagnosis not present

## 2022-04-13 DIAGNOSIS — Z7984 Long term (current) use of oral hypoglycemic drugs: Secondary | ICD-10-CM

## 2022-04-13 HISTORY — PX: LUMBAR LAMINECTOMY/DECOMPRESSION MICRODISCECTOMY: SHX5026

## 2022-04-13 LAB — GLUCOSE, CAPILLARY
Glucose-Capillary: 168 mg/dL — ABNORMAL HIGH (ref 70–99)
Glucose-Capillary: 107 mg/dL — ABNORMAL HIGH (ref 70–99)
Glucose-Capillary: 70 mg/dL (ref 70–99)
Glucose-Capillary: 107 mg/dL — ABNORMAL HIGH (ref 70–99)
Glucose-Capillary: 179 mg/dL — ABNORMAL HIGH (ref 70–99)
Glucose-Capillary: 232 mg/dL — ABNORMAL HIGH (ref 70–99)

## 2022-04-13 SURGERY — LUMBAR LAMINECTOMY/DECOMPRESSION MICRODISCECTOMY 1 LEVEL
Anesthesia: General | Site: Spine Lumbar

## 2022-04-13 MED ORDER — OXYCODONE HCL 5 MG PO TABS
5.0000 mg | ORAL_TABLET | ORAL | Status: DC | PRN
  Administered 2022-04-13 – 2022-04-14 (×3): 5 mg via ORAL
  Filled 2022-04-13 (×3): qty 1

## 2022-04-13 MED ORDER — VANCOMYCIN HCL IN DEXTROSE 1-5 GM/200ML-% IV SOLN
1000.0000 mg | Freq: Once | INTRAVENOUS | Status: AC
  Administered 2022-04-13: 1000 mg via INTRAVENOUS
  Filled 2022-04-13: qty 200

## 2022-04-13 MED ORDER — FENTANYL CITRATE (PF) 100 MCG/2ML IJ SOLN: 25.0000 ug | INTRAMUSCULAR | Status: DC | PRN

## 2022-04-13 MED ORDER — METOPROLOL SUCCINATE ER 100 MG PO TB24: 200.0000 mg | ORAL_TABLET | Freq: Every day | ORAL | Status: DC

## 2022-04-13 MED ORDER — PROPOFOL 10 MG/ML IV BOLUS
INTRAVENOUS | Status: AC
  Filled 2022-04-13: qty 20

## 2022-04-13 MED ORDER — ONDANSETRON HCL 4 MG/2ML IJ SOLN: 4.0000 mg | Freq: Once | INTRAMUSCULAR | Status: DC | PRN

## 2022-04-13 MED ORDER — TIZANIDINE HCL 4 MG PO TABS
4.0000 mg | ORAL_TABLET | Freq: Every day | ORAL | Status: DC
  Administered 2022-04-13: 4 mg via ORAL
  Filled 2022-04-13: qty 1

## 2022-04-13 MED ORDER — CHLORHEXIDINE GLUCONATE CLOTH 2 % EX PADS: 6.0000 | MEDICATED_PAD | Freq: Once | CUTANEOUS | Status: DC

## 2022-04-13 MED ORDER — LACTATED RINGERS IV SOLN: INTRAVENOUS | Status: DC

## 2022-04-13 MED ORDER — DAPAGLIFLOZIN PROPANEDIOL 10 MG PO TABS
10.0000 mg | ORAL_TABLET | Freq: Every day | ORAL | Status: DC
  Filled 2022-04-13: qty 1

## 2022-04-13 MED ORDER — MORPHINE SULFATE (PF) 2 MG/ML IV SOLN: 2.0000 mg | INTRAVENOUS | Status: DC | PRN

## 2022-04-13 MED ORDER — TIZANIDINE HCL 4 MG PO TABS
8.0000 mg | ORAL_TABLET | Freq: Every day | ORAL | Status: DC
  Administered 2022-04-13: 8 mg via ORAL
  Filled 2022-04-13: qty 2

## 2022-04-13 MED ORDER — THROMBIN 5000 UNITS EX SOLR
CUTANEOUS | Status: AC
  Filled 2022-04-13: qty 5000

## 2022-04-13 MED ORDER — OXYCODONE HCL 5 MG PO TABS: 10.0000 mg | ORAL_TABLET | ORAL | Status: DC | PRN

## 2022-04-13 MED ORDER — SUGAMMADEX SODIUM 200 MG/2ML IV SOLN
INTRAVENOUS | Status: DC | PRN
  Administered 2022-04-13: 150 mg via INTRAVENOUS

## 2022-04-13 MED ORDER — DOCUSATE SODIUM 100 MG PO CAPS
100.0000 mg | ORAL_CAPSULE | Freq: Two times a day (BID) | ORAL | Status: DC
  Administered 2022-04-13 – 2022-04-14 (×2): 100 mg via ORAL
  Filled 2022-04-13 (×2): qty 1

## 2022-04-13 MED ORDER — SODIUM CHLORIDE 0.9% FLUSH
3.0000 mL | Freq: Two times a day (BID) | INTRAVENOUS | Status: DC
  Administered 2022-04-13: 3 mL via INTRAVENOUS

## 2022-04-13 MED ORDER — FUROSEMIDE 40 MG PO TABS
40.0000 mg | ORAL_TABLET | Freq: Every day | ORAL | Status: DC
  Administered 2022-04-13: 40 mg via ORAL
  Filled 2022-04-13: qty 1

## 2022-04-13 MED ORDER — TRAZODONE HCL 50 MG PO TABS
50.0000 mg | ORAL_TABLET | Freq: Every day | ORAL | Status: DC
  Administered 2022-04-13: 50 mg via ORAL
  Filled 2022-04-13: qty 1

## 2022-04-13 MED ORDER — LIDOCAINE 2% (20 MG/ML) 5 ML SYRINGE
INTRAMUSCULAR | Status: DC | PRN
  Administered 2022-04-13: 60 mg via INTRAVENOUS

## 2022-04-13 MED ORDER — ONDANSETRON HCL 4 MG/2ML IJ SOLN
INTRAMUSCULAR | Status: AC
  Filled 2022-04-13: qty 2

## 2022-04-13 MED ORDER — DEXAMETHASONE SODIUM PHOSPHATE 10 MG/ML IJ SOLN
INTRAMUSCULAR | Status: AC
  Filled 2022-04-13: qty 1

## 2022-04-13 MED ORDER — INSULIN ASPART 100 UNIT/ML IJ SOLN: 0.0000 [IU] | Freq: Three times a day (TID) | INTRAMUSCULAR | Status: DC

## 2022-04-13 MED ORDER — BUPIVACAINE-EPINEPHRINE (PF) 0.5% -1:200000 IJ SOLN
INTRAMUSCULAR | Status: AC
  Filled 2022-04-13: qty 30

## 2022-04-13 MED ORDER — SPIRONOLACTONE 25 MG PO TABS: 25.0000 mg | ORAL_TABLET | Freq: Every morning | ORAL | Status: DC

## 2022-04-13 MED ORDER — VANCOMYCIN HCL IN DEXTROSE 1-5 GM/200ML-% IV SOLN
1000.0000 mg | INTRAVENOUS | Status: AC
  Administered 2022-04-13: 1000 mg via INTRAVENOUS
  Filled 2022-04-13: qty 200

## 2022-04-13 MED ORDER — TIZANIDINE HCL 4 MG PO TABS: 4.0000 mg | ORAL_TABLET | ORAL | Status: DC

## 2022-04-13 MED ORDER — ACETAMINOPHEN 325 MG PO TABS
650.0000 mg | ORAL_TABLET | ORAL | Status: DC | PRN
  Administered 2022-04-13: 650 mg via ORAL
  Filled 2022-04-13: qty 2

## 2022-04-13 MED ORDER — ISOSORBIDE MONONITRATE ER 60 MG PO TB24: 120.0000 mg | ORAL_TABLET | Freq: Every day | ORAL | Status: DC

## 2022-04-13 MED ORDER — RANOLAZINE ER 500 MG PO TB12
500.0000 mg | ORAL_TABLET | Freq: Two times a day (BID) | ORAL | Status: DC
  Administered 2022-04-13: 500 mg via ORAL
  Filled 2022-04-13 (×2): qty 1

## 2022-04-13 MED ORDER — INSULIN ASPART 100 UNIT/ML IJ SOLN
0.0000 [IU] | INTRAMUSCULAR | Status: DC | PRN
  Administered 2022-04-13: 2 [IU] via SUBCUTANEOUS
  Filled 2022-04-13: qty 1

## 2022-04-13 MED ORDER — MENTHOL 3 MG MT LOZG: 1.0000 | LOZENGE | OROMUCOSAL | Status: DC | PRN

## 2022-04-13 MED ORDER — INSULIN ASPART 100 UNIT/ML IJ SOLN
0.0000 [IU] | Freq: Three times a day (TID) | INTRAMUSCULAR | Status: DC
  Administered 2022-04-14: 3 [IU] via SUBCUTANEOUS

## 2022-04-13 MED ORDER — ONDANSETRON HCL 4 MG/2ML IJ SOLN
INTRAMUSCULAR | Status: DC | PRN
  Administered 2022-04-13: 4 mg via INTRAVENOUS

## 2022-04-13 MED ORDER — FENTANYL CITRATE (PF) 250 MCG/5ML IJ SOLN
INTRAMUSCULAR | Status: DC | PRN
  Administered 2022-04-13: 50 ug via INTRAVENOUS
  Administered 2022-04-13: 100 ug via INTRAVENOUS

## 2022-04-13 MED ORDER — POTASSIUM CHLORIDE CRYS ER 10 MEQ PO TBCR
10.0000 meq | EXTENDED_RELEASE_TABLET | Freq: Every day | ORAL | Status: DC
  Administered 2022-04-13: 10 meq via ORAL
  Filled 2022-04-13: qty 1

## 2022-04-13 MED ORDER — PHENOL 1.4 % MT LIQD: 1.0000 | OROMUCOSAL | Status: DC | PRN

## 2022-04-13 MED ORDER — PANTOPRAZOLE SODIUM 40 MG PO TBEC
40.0000 mg | DELAYED_RELEASE_TABLET | Freq: Every morning | ORAL | Status: DC
  Administered 2022-04-14: 40 mg via ORAL
  Filled 2022-04-13: qty 1

## 2022-04-13 MED ORDER — LIDOCAINE-EPINEPHRINE 1 %-1:100000 IJ SOLN
INTRAMUSCULAR | Status: AC
  Filled 2022-04-13: qty 1

## 2022-04-13 MED ORDER — GABAPENTIN 400 MG PO CAPS
1500.0000 mg | ORAL_CAPSULE | Freq: Two times a day (BID) | ORAL | Status: DC
  Administered 2022-04-13: 1500 mg via ORAL
  Filled 2022-04-13: qty 1

## 2022-04-13 MED ORDER — INSULIN ASPART 100 UNIT/ML IJ SOLN
0.0000 [IU] | Freq: Every day | INTRAMUSCULAR | Status: DC
  Administered 2022-04-13: 2 [IU] via SUBCUTANEOUS

## 2022-04-13 MED ORDER — THROMBIN 5000 UNITS EX SOLR: OROMUCOSAL | Status: DC | PRN

## 2022-04-13 MED ORDER — ROCURONIUM BROMIDE 50 MG/5ML IV SOSY
PREFILLED_SYRINGE | INTRAVENOUS | Status: DC | PRN
  Administered 2022-04-13: 60 mg via INTRAVENOUS

## 2022-04-13 MED ORDER — LIDOCAINE-EPINEPHRINE 1 %-1:100000 IJ SOLN
INTRAMUSCULAR | Status: DC | PRN
  Administered 2022-04-13: 3 mL

## 2022-04-13 MED ORDER — MIDAZOLAM HCL 2 MG/2ML IJ SOLN
INTRAMUSCULAR | Status: AC
  Filled 2022-04-13: qty 2

## 2022-04-13 MED ORDER — PHENYLEPHRINE HCL-NACL 20-0.9 MG/250ML-% IV SOLN
INTRAVENOUS | Status: DC | PRN
  Administered 2022-04-13: 50 ug/min via INTRAVENOUS

## 2022-04-13 MED ORDER — ACETAMINOPHEN 650 MG RE SUPP: 650.0000 mg | RECTAL | Status: DC | PRN

## 2022-04-13 MED ORDER — EPHEDRINE SULFATE-NACL 50-0.9 MG/10ML-% IV SOSY
PREFILLED_SYRINGE | INTRAVENOUS | Status: DC | PRN
  Administered 2022-04-13: 5 mg via INTRAVENOUS
  Administered 2022-04-13 (×2): 10 mg via INTRAVENOUS

## 2022-04-13 MED ORDER — SODIUM CHLORIDE 0.9% FLUSH: 3.0000 mL | INTRAVENOUS | Status: DC | PRN

## 2022-04-13 MED ORDER — CHLORHEXIDINE GLUCONATE 0.12 % MT SOLN
15.0000 mL | Freq: Once | OROMUCOSAL | Status: AC
  Administered 2022-04-13: 15 mL via OROMUCOSAL
  Filled 2022-04-13: qty 15

## 2022-04-13 MED ORDER — MIDAZOLAM HCL 5 MG/5ML IJ SOLN
INTRAMUSCULAR | Status: DC | PRN
  Administered 2022-04-13: 2 mg via INTRAVENOUS

## 2022-04-13 MED ORDER — KETOROLAC TROMETHAMINE 15 MG/ML IJ SOLN
7.5000 mg | Freq: Four times a day (QID) | INTRAMUSCULAR | Status: AC
  Administered 2022-04-13 – 2022-04-14 (×4): 7.5 mg via INTRAVENOUS
  Filled 2022-04-13 (×4): qty 1

## 2022-04-13 MED ORDER — NITROGLYCERIN 0.4 MG SL SUBL: 0.4000 mg | SUBLINGUAL_TABLET | SUBLINGUAL | Status: DC | PRN

## 2022-04-13 MED ORDER — PROPOFOL 10 MG/ML IV BOLUS
INTRAVENOUS | Status: DC | PRN
  Administered 2022-04-13: 110 mg via INTRAVENOUS

## 2022-04-13 MED ORDER — BUPIVACAINE-EPINEPHRINE (PF) 0.5% -1:200000 IJ SOLN
INTRAMUSCULAR | Status: DC | PRN
  Administered 2022-04-13: 3 mL via PERINEURAL

## 2022-04-13 MED ORDER — 0.9 % SODIUM CHLORIDE (POUR BTL) OPTIME
TOPICAL | Status: DC | PRN
  Administered 2022-04-13: 1000 mL

## 2022-04-13 MED ORDER — PHENYLEPHRINE HCL (PRESSORS) 10 MG/ML IV SOLN
INTRAVENOUS | Status: DC | PRN
  Administered 2022-04-13: 80 ug via INTRAVENOUS
  Administered 2022-04-13 (×5): 160 ug via INTRAVENOUS

## 2022-04-13 MED ORDER — ORAL CARE MOUTH RINSE: 15.0000 mL | Freq: Once | OROMUCOSAL | Status: AC

## 2022-04-13 MED ORDER — DEXAMETHASONE SODIUM PHOSPHATE 10 MG/ML IJ SOLN
INTRAMUSCULAR | Status: DC | PRN
  Administered 2022-04-13: 4 mg via INTRAVENOUS

## 2022-04-13 MED ORDER — FENTANYL CITRATE (PF) 250 MCG/5ML IJ SOLN
INTRAMUSCULAR | Status: AC
  Filled 2022-04-13: qty 5

## 2022-04-13 MED ORDER — ONDANSETRON HCL 4 MG/2ML IJ SOLN
4.0000 mg | Freq: Four times a day (QID) | INTRAMUSCULAR | Status: DC | PRN
  Administered 2022-04-13: 4 mg via INTRAVENOUS
  Filled 2022-04-13: qty 2

## 2022-04-13 MED ORDER — SODIUM CHLORIDE 0.9 % IV SOLN
250.0000 mL | INTRAVENOUS | Status: DC
  Administered 2022-04-13: 250 mL via INTRAVENOUS

## 2022-04-13 MED ORDER — ACETAMINOPHEN 500 MG PO TABS
1000.0000 mg | ORAL_TABLET | Freq: Once | ORAL | Status: AC
  Administered 2022-04-13: 1000 mg via ORAL
  Filled 2022-04-13: qty 2

## 2022-04-13 MED ORDER — LOSARTAN POTASSIUM 50 MG PO TABS: 25.0000 mg | ORAL_TABLET | Freq: Every day | ORAL | Status: DC

## 2022-04-13 MED ORDER — ONDANSETRON HCL 4 MG PO TABS: 4.0000 mg | ORAL_TABLET | Freq: Four times a day (QID) | ORAL | Status: DC | PRN

## 2022-04-13 MED ORDER — ATORVASTATIN CALCIUM 80 MG PO TABS
80.0000 mg | ORAL_TABLET | Freq: Every day | ORAL | Status: DC
  Administered 2022-04-13: 80 mg via ORAL
  Filled 2022-04-13: qty 1

## 2022-04-13 SURGICAL SUPPLY — 58 items
ADH SKN CLS APL DERMABOND .7 (GAUZE/BANDAGES/DRESSINGS) ×1
BAG COUNTER SPONGE SURGICOUNT (BAG) ×2 IMPLANT
BAG SPNG CNTER NS LX DISP (BAG) ×1
BAND INSRT 18 STRL LF DISP RB (MISCELLANEOUS) ×2
BAND RUBBER #18 3X1/16 STRL (MISCELLANEOUS) ×4 IMPLANT
BUR CARBIDE MATCH 3.0 (BURR) ×2 IMPLANT
CNTNR URN SCR LID CUP LEK RST (MISCELLANEOUS) ×2 IMPLANT
CONT SPEC 4OZ STRL OR WHT (MISCELLANEOUS) ×1
COVER MAYO STAND STRL (DRAPES) ×2 IMPLANT
DERMABOND ADVANCED .7 DNX12 (GAUZE/BANDAGES/DRESSINGS) IMPLANT
DRAIN JACKSON RD 7FR 3/32 (WOUND CARE) IMPLANT
DRAPE C-ARM 42X72 X-RAY (DRAPES) ×2 IMPLANT
DRAPE LAPAROTOMY 100X72X124 (DRAPES) ×2 IMPLANT
DRAPE MICROSCOPE SLANT 54X150 (MISCELLANEOUS) ×2 IMPLANT
DRAPE SURG 17X23 STRL (DRAPES) ×2 IMPLANT
DRSG OPSITE POSTOP 4X6 (GAUZE/BANDAGES/DRESSINGS) IMPLANT
DURAPREP 26ML APPLICATOR (WOUND CARE) ×2 IMPLANT
ELECT BLADE INSULATED 4IN (ELECTROSURGICAL) ×1
ELECT REM PT RETURN 9FT ADLT (ELECTROSURGICAL) ×1
ELECTRODE BLADE INSULATED 4IN (ELECTROSURGICAL) ×2 IMPLANT
ELECTRODE REM PT RTRN 9FT ADLT (ELECTROSURGICAL) ×2 IMPLANT
EVACUATOR 1/8 PVC DRAIN (DRAIN) IMPLANT
GAUZE 4X4 16PLY ~~LOC~~+RFID DBL (SPONGE) IMPLANT
GAUZE SPONGE 4X4 12PLY STRL (GAUZE/BANDAGES/DRESSINGS) ×2 IMPLANT
GLOVE BIO SURGEON STRL SZ7 (GLOVE) ×2 IMPLANT
GLOVE BIOGEL PI IND STRL 7.0 (GLOVE) ×2 IMPLANT
GLOVE BIOGEL PI IND STRL 7.5 (GLOVE) ×2 IMPLANT
GLOVE BIOGEL PI IND STRL 8 (GLOVE) ×2 IMPLANT
GLOVE ECLIPSE 8.0 STRL XLNG CF (GLOVE) ×4 IMPLANT
GOWN STRL REUS W/ TWL LRG LVL3 (GOWN DISPOSABLE) IMPLANT
GOWN STRL REUS W/ TWL XL LVL3 (GOWN DISPOSABLE) ×4 IMPLANT
GOWN STRL REUS W/TWL 2XL LVL3 (GOWN DISPOSABLE) IMPLANT
GOWN STRL REUS W/TWL LRG LVL3 (GOWN DISPOSABLE)
GOWN STRL REUS W/TWL XL LVL3 (GOWN DISPOSABLE) ×2
HEMOSTAT POWDER KIT SURGIFOAM (HEMOSTASIS) ×2 IMPLANT
KIT BASIN OR (CUSTOM PROCEDURE TRAY) ×2 IMPLANT
KIT POSITION SURG JACKSON T1 (MISCELLANEOUS) ×2 IMPLANT
KIT TURNOVER KIT B (KITS) ×2 IMPLANT
MARKER SKIN DUAL TIP RULER LAB (MISCELLANEOUS) ×2 IMPLANT
NDL HYPO 25X1 1.5 SAFETY (NEEDLE) ×2 IMPLANT
NEEDLE HYPO 25X1 1.5 SAFETY (NEEDLE) ×1 IMPLANT
NS IRRIG 1000ML POUR BTL (IV SOLUTION) ×2 IMPLANT
PACK LAMINECTOMY NEURO (CUSTOM PROCEDURE TRAY) ×2 IMPLANT
PAD ARMBOARD 7.5X6 YLW CONV (MISCELLANEOUS) ×6 IMPLANT
PATTIES SURGICAL .5 X.5 (GAUZE/BANDAGES/DRESSINGS) IMPLANT
PATTIES SURGICAL .5 X1 (DISPOSABLE) IMPLANT
PATTIES SURGICAL 1X1 (DISPOSABLE) IMPLANT
SPIKE FLUID TRANSFER (MISCELLANEOUS) ×2 IMPLANT
SPONGE SURGIFOAM ABS GEL SZ50 (HEMOSTASIS) ×2 IMPLANT
SPONGE T-LAP 4X18 ~~LOC~~+RFID (SPONGE) IMPLANT
STAPLER VISISTAT 35W (STAPLE) IMPLANT
SUT VIC AB 0 CT1 18XCR BRD8 (SUTURE) ×2 IMPLANT
SUT VIC AB 0 CT1 8-18 (SUTURE) ×1
SUT VIC AB 2-0 CP2 18 (SUTURE) ×2 IMPLANT
SUT VIC AB 3-0 SH 8-18 (SUTURE) ×2 IMPLANT
TOWEL GREEN STERILE (TOWEL DISPOSABLE) ×2 IMPLANT
TOWEL GREEN STERILE FF (TOWEL DISPOSABLE) ×2 IMPLANT
WATER STERILE IRR 1000ML POUR (IV SOLUTION) ×2 IMPLANT

## 2022-04-13 NOTE — Anesthesia Procedure Notes (Signed)
Procedure Name: Intubation Date/Time: 04/13/2022 7:43 AM  Performed by: Adria Dill, CRNAPre-anesthesia Checklist: Patient identified, Emergency Drugs available, Suction available and Patient being monitored Patient Re-evaluated:Patient Re-evaluated prior to induction Oxygen Delivery Method: Circle system utilized Preoxygenation: Pre-oxygenation with 100% oxygen Induction Type: IV induction Ventilation: Mask ventilation without difficulty Laryngoscope Size: Miller and 3 Grade View: Grade I Tube type: Oral Tube size: 7.5 mm Number of attempts: 1 Airway Equipment and Method: Stylet and Oral airway Placement Confirmation: ETT inserted through vocal cords under direct vision, positive ETCO2 and breath sounds checked- equal and bilateral Secured at: 21 cm Tube secured with: Tape Dental Injury: Teeth and Oropharynx as per pre-operative assessment

## 2022-04-13 NOTE — Transfer of Care (Signed)
Immediate Anesthesia Transfer of Care Note  Patient: Terry Nelson  Procedure(s) Performed: LUMBAR THREE-FOUR LAMINECTOMY AND FORAMINOTOMY (Spine Lumbar)  Patient Location: PACU  Anesthesia Type:General  Level of Consciousness: awake and patient cooperative  Airway & Oxygen Therapy: Patient Spontanous Breathing and Patient connected to nasal cannula oxygen  Post-op Assessment: Report given to RN and Post -op Vital signs reviewed and stable  Post vital signs: Reviewed and stable  Last Vitals:  Vitals Value Taken Time  BP 141/75 04/13/22 0949  Temp    Pulse 72 04/13/22 0954  Resp 11 04/13/22 0954  SpO2 95 % 04/13/22 0954  Vitals shown include unvalidated device data.  Last Pain:  Vitals:   04/13/22 0554  TempSrc: Oral         Complications: No notable events documented.

## 2022-04-13 NOTE — Op Note (Signed)
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 Kendell Bane C. Danasia Baker, DO  ASSISTANT: Dr. Lisbeth Renshaw, MD; Paticia Stack, PA  ANESTHESIA: General Endotracheal  EBL: 20 cc  SPECIMENS: None  DRAINS: None  COMPLICATIONS: None  CONDITION: Hemodynamically stable  HISTORY: Terry Nelson is a 67 y.o. male with a history of right L3-4 microdiscectomy many years ago, LP shunt for pseudotumor cerebri (since removed) L5-S1 PLIF, with complaints of worsening bilateral lower extremity numbness and radiculopathy, left greater than right.  MRI revealed severe lateral recess stenosis bilaterally at L3-4 due to ligamentum and facet hypertrophy as well as moderate to severe central stenosis and bilateral foraminal stenosis.  He failed conservative measures and therefore I offered him surgical intervention in the form of an open L3-4 laminectomy, bilateral lateral recess decompression and bilateral foraminotomies.  We discussed all risks, benefits and expected outcomes as well as alternatives to treatment.  PROCEDURE IN DETAIL: The patient was brought to the operating room. After induction of general anesthesia, the patient was positioned on the operative table in the prone position. All pressure points were meticulously padded. Skin incision was then marked out and prepped and draped in the usual sterile fashion. Physician driven timeout was performed.  Local anesthetic was injected into the planned incision.  Using lateral fluoroscopy, the L3-4 space was identified.  Using 10 blade, midline incision was made sharply down to the dorsal fascia.  Using  Bovie electrocautery, bilateral subperiosteal dissection was performed to expose the L3 bilateral and L4 bilateral lamina and medial facets.  There was clearly significant scarring right greater than left from prior laminotomy.  Lateral fluoroscopy confirmed the appropriate level.  Microscope was sterilely draped and brought into the field for the remainder the procedure.  Using Leksell rongeur, the L3 and L4 spinous processes were removed down to the lamina.  Using the high-speed drill, a bilateral laminectomy was performed down to the ligamentum flavum of L3 to the superior attachment point.  This was carried laterally on the left and the right to the lateral recess.  Then using the high-speed drill, the superior portion of the bilateral L4 lamina was removed down to the epidural space.  There was significant ligamentum hypertrophy, calcification and scarring.  The epidural space was identified and using micro curettes, I carefully dissected the ligamentum flavum off of the L4 lamina towards the left.  Using a series of Kerrison rongeurs, ligamentum flavum was resected on the left side to the lateral recess.  The entire ligamentum flavum was significantly adherent to the dura however I was able to dissect a plane carefully and remove the ligamentum.  This was then attempted to be dissected on the right side from the L4 lamina.  I was able to do this partially.  Along the lateral recess I then identified what appeared to be old glue for a durotomy from his prior discectomy at L3-4 on the right.  Therefore I change my direction of dissection to the superior portion of the ligamentum along L3 and dissected this down inferiorly.  There was a moderate size facet synovial cyst on the right that was removed with Kerrison rongeurs.  I was able to carry this decompression to the lateral recess however I  did not feel it was appropriate to dissect off the old glue in efforts to not open an old durotomy.  Therefore  foraminotomy on the right was not completed.  The lateral recess decompression was carried further lateral, and utilizing Penfield 4 I dissected the epidural space to the left foramina.  This was cleared free of ligamentum flavum from the medial foramina using Kerrison rongeur.  Epidural hemostasis was achieved with Surgifoam.  Using ball-tipped probe and Penfield 4, the left lateral recess and foraminal was felt and noted to be decompressed.  The right lateral recess was felt and noted to be decompressed.  Copious irrigation was used, the wound was excellently hemostatic.  Retractor was taken of the wound.  Soft tissue hemostasis was achieved with bipolar cautery.  The wound was then closed in layers, muscle and fascia with 0 Vicryl suture.  Dermis was closed with 2-0 and 3-0 Vicryl sutures.  Skin was closed with skin glue, sterile dressing applied.  At the end of the case all sponge, needle, and instrument counts were correct. The patient was then transferred to the stretcher, extubated, and taken to the post-anesthesia care unit in stable hemodynamic condition.

## 2022-04-13 NOTE — H&P (Signed)
Providing Compassionate, Quality Care - Together  NEUROSURGERY HISTORY & PHYSICAL   Terry Nelson is an 67 y.o. male.   Chief Complaint: Bilateral lower extremity claudication and radiculopathy HPI: This is a 67 year old male with a history of CAD, cardiac stents, reflux, multiple lumbar spine surgeries, with complaints of worsening bilateral lower extremity neurogenic claudication and radiculopathy.  Imaging revealed moderate to severe stenosis at L3-4 due to disc osteophyte complex and facet and ligamentous hypertrophy with severe lateral recess stenosis and foraminal stenosis.  He failed conservative measures and presents today for surgical intervention.  He obtained cardiac clearance.  Past Medical History:  Diagnosis Date   Anxiety state    pt denies   Body mass index (bmi) 26.0-26.9, adult    CAD (coronary artery disease)    a. 2002 s/p CABG x 3 (LIMA->LAD, VG->OM1->OM2); b. s/p prior LAD and dRCA/RPDA stenting; c. 04/2019 PTCA & lithotripsy of ISR throughout LAD; d. 08/2020 PCI/scoring balloon PTCA throughout LAD 2/2 ISR; e. 09/2020 Cath: LM nl, LAD 40ost/p, 40p/d, 75d ISR (FFR 0.86), 50d, LCX small, OM1 90, OM2 25, RCA 20d ISR, RPDA 20 ISR, VG->OM1->OM2 ok, LIMA->LAD 1420m-->Med rx.   Chronic heart failure with preserved ejection fraction (HFpEF)    a. 09/2020 Echo: Ef 60-65%, no rwma, nl RV size/fxn. Mod dil LA. triv MR. Mild AoV sclerosis w/o stenosis.   CKD (chronic kidney disease), stage II    DDD (degenerative disc disease), lumbar    DM (diabetes mellitus)    Gastritis    GERD (gastroesophageal reflux disease)    Headache    Hemorrhage    History of kidney stones    Hyperlipidemia    Hypertension    Kidney stones    Lumbago    Myocardial infarction    Neck pain    Paroxysmal A-fib    a. CHA2DS2VASc = 4-->Eliquis; b. 06/2020 Zio: No afib. Up to 20 beats PSVT.   Pernicious anemia    Unstable angina     Past Surgical History:  Procedure Laterality Date   ABLATION   2017   CARDIAC CATHETERIZATION     CORONARY ARTERY BYPASS GRAFT     CORONARY BALLOON ANGIOPLASTY N/A 04/27/2019   Procedure: CORONARY BALLOON ANGIOPLASTY;  Surgeon: Yvonne KendallEnd, Christopher, MD;  Location: [facility];  Service: Cardiovascular;  Laterality: N/A;   CORONARY BALLOON ANGIOPLASTY N/A 12/16/2019   Procedure: CORONARY BALLOON ANGIOPLASTY;  Surgeon: Lyn RecordsSmith, Henry W, MD;  Location: [facility];  Service: Cardiovascular;  Laterality: N/A;   CORONARY BALLOON ANGIOPLASTY N/A 09/06/2020   Procedure: CORONARY BALLOON ANGIOPLASTY;  Surgeon: Lyn RecordsSmith, Henry W, MD;  Location: [facility];  Service: Cardiovascular;  Laterality: N/A;   CORONARY BALLOON ANGIOPLASTY N/A 06/02/2021   Procedure: CORONARY BALLOON ANGIOPLASTY;  Surgeon: Lyn RecordsSmith, Henry W, MD;  Location: [facility];  Service: Cardiovascular;  Laterality: N/A;   CORONARY PRESSURE/FFR STUDY N/A 09/30/2020   Procedure: INTRAVASCULAR PRESSURE WIRE/FFR STUDY;  Surgeon: Orbie Pyohukkani, Arun K, MD;  Location: [facility];  Service: Cardiovascular;  Laterality: N/A;   CORONARY PRESSURE/FFR STUDY N/A 06/02/2021   Procedure: INTRAVASCULAR PRESSURE WIRE/FFR STUDY;  Surgeon: Lyn RecordsSmith, Henry W, MD;  Location: [facility];  Service: Cardiovascular;  Laterality: N/A;   CORONARY ULTRASOUND/IVUS N/A 04/27/2019   Procedure: Intravascular Ultrasound/IVUS;  Surgeon: Yvonne KendallEnd, Christopher, MD;  Location: [facility];  Service: Cardiovascular;  Laterality: N/A;   CORONARY ULTRASOUND/IVUS N/A 12/16/2019   Procedure: Intravascular Ultrasound/IVUS;  Surgeon:  Lyn RecordsSmith, Henry W, MD;  [facility];  Service: Cardiovascular;  Laterality: N/A;   KNEE ARTHROSCOPY Bilateral    3 times on each knee   LEFT HEART CATH AND CORS/GRAFTS ANGIOGRAPHY N/A 04/09/2019   Procedure: LEFT HEART CATH AND CORS/GRAFTS ANGIOGRAPHY;  Surgeon: Lyn RecordsSmith, Henry W, MD;  Location: [facility];  Service: Cardiovascular;  Laterality: N/A;   LEFT HEART  CATH AND CORS/GRAFTS ANGIOGRAPHY N/A 12/16/2019   Procedure: LEFT HEART CATH AND CORS/GRAFTS ANGIOGRAPHY;  Surgeon: Lyn RecordsSmith, Henry W, MD;  Location: [facility];  Service: Cardiovascular;  Laterality: N/A;   LEFT HEART CATH AND CORS/GRAFTS ANGIOGRAPHY N/A 09/06/2020   Procedure: LEFT HEART CATH AND CORS/GRAFTS ANGIOGRAPHY;  Surgeon: Lyn RecordsSmith, Henry W, MD;  Location: [facility];  Service: Cardiovascular;  Laterality: N/A;   LEFT HEART CATH AND CORS/GRAFTS ANGIOGRAPHY N/A 09/30/2020   Procedure: LEFT HEART CATH AND CORS/GRAFTS ANGIOGRAPHY;  Surgeon: Orbie Pyohukkani, Arun K, MD;  Location: [facility];  Service: Cardiovascular;  Laterality: N/A;   LEFT HEART CATH AND CORS/GRAFTS ANGIOGRAPHY N/A 06/02/2021   Procedure: LEFT HEART CATH AND CORS/GRAFTS ANGIOGRAPHY;  Surgeon: Lyn RecordsSmith, Henry W, MD;  Location: [facility];  Service: Cardiovascular;  Laterality: N/A;   LUMBAR FUSION     L5-S1   LUMBAR PERITONEAL SHUNT     pt states "this is blocked"   SHOULDER ARTHROSCOPY Bilateral     Family History  Problem Relation Age of Onset   High blood pressure Mother    Stroke Mother    Diabetes Father    Heart disease Other    Social History:  reports that he has never smoked. He has quit using smokeless tobacco.  His smokeless tobacco use included chew. He reports that he does not drink alcohol and does not use drugs.  Allergies:  Allergies  Allergen Reactions   Metoclopramide Other (See Comments)    DYSKINESIAS contractions of whole body   Penicillins Swelling and Rash    Did it involve swelling of the face/tongue/throat, SOB, or low BP? Yes Did it involve sudden or severe rash/hives, skin peeling, or any reaction on the inside of your mouth or nose? Unknown Did you need to seek medical attention at a hospital or doctor's office? Yes When did it last happen?More than 40 years ago    If all above answers are "NO", may proceed with cephalosporin use.     Medications Prior to  Admission  Medication Sig Dispense Refill   aspirin 81 MG EC tablet Take 1 tablet (81 mg total) by mouth daily. Swallow whole. 30 tablet 11   atorvastatin (LIPITOR) 80 MG tablet Take 80 mg by mouth at bedtime.     dapagliflozin propanediol (FARXIGA) 10 MG TABS tablet Take 1 tablet (10 mg total) by mouth daily. 90 tablet 3   furosemide (LASIX) 40 MG tablet Take 40mg  (1 tablet) daily. Take additional 40mg  as needed for weight gain of 2 pounds overnight or 5 pounds in one week. 45 tablet 9   gabapentin (NEURONTIN) 300 MG capsule Take 1,500 mg by mouth in the morning and at bedtime.     isosorbide mononitrate (IMDUR) 60 MG 24 hr tablet Take 2 tablets (120 mg total) by mouth daily. 180 tablet 3   losartan (COZAAR) 25 MG tablet Take 1 tablet (25 mg total) by mouth daily. 90 tablet 3   magnesium oxide (MAG-OX) 400 MG tablet Take 800 mg by mouth every morning.  metoprolol (TOPROL XL) 200 MG 24 hr tablet Take 1 tablet (200 mg total) by mouth daily. 90 tablet 3   nitroGLYCERIN (NITROSTAT) 0.4 MG SL tablet Place 1 tablet (0.4 mg total) under the tongue every 5 (five) minutes as needed for chest pain. 100 tablet 2   oxyCODONE-acetaminophen (PERCOCET) 10-325 MG tablet Take 1 tablet by mouth every 4 (four) hours as needed for pain.     pantoprazole (PROTONIX) 40 MG tablet Take 40 mg by mouth every morning.     Polyethylene Glycol 400 (BLINK TEARS) 0.25 % SOLN Place 1 drop into both eyes daily as needed (dry eyes).     potassium chloride (KLOR-CON M) 10 MEQ tablet Take 1 tablet (10 mEq total) by mouth daily. (Patient taking differently: Take 10 mEq by mouth at bedtime.) 90 tablet 2   prasugrel (EFFIENT) 10 MG TABS tablet Take 10 mg by mouth every morning.     promethazine (PHENERGAN) 25 MG tablet Take 25 mg by mouth 2 (two) times daily as needed for nausea or vomiting (Headache).     ranolazine (RANEXA) 500 MG 12 hr tablet Take 1 tablet (500 mg total) by mouth 2 (two) times daily. 180 tablet 3    spironolactone (ALDACTONE) 25 MG tablet Take 25 mg by mouth every morning.     tiZANidine (ZANAFLEX) 4 MG tablet Take 4-8 mg by mouth See admin instructions. Take one tablet (4 mg) by mouth daily with supper, take two tablets (8 mg) daily at bedtime, may also take one tablet (4 mg) in the morning     traZODone (DESYREL) 50 MG tablet Take 50 mg by mouth at bedtime.     TRESIBA FLEXTOUCH 100 UNIT/ML FlexTouch Pen Inject 36 Units into the skin daily.     Blood Glucose Monitoring Suppl (CONTOUR NEXT MONITOR) w/Device KIT daily.     glucose blood (CONTOUR NEXT TEST) test strip by Does not apply route.     Insulin Pen Needle (BD PEN NEEDLE NANO U/F) 32G X 4 MM MISC USE ONCE AS DIRECTED      Results for orders placed or performed during the hospital encounter of 04/13/22 (from the past 48 hour(s))  Glucose, capillary     Status: Abnormal   Collection Time: 04/13/22  5:59 AM  Result Value Ref Range   Glucose-Capillary 168 (H) 70 - 99 mg/dL    Comment: Glucose reference range applies only to samples taken after fasting for at least 8 hours.   No results found.  ROS All pertinent positives and negatives are listed in HPI above  Blood pressure (!) 153/91, pulse 69, temperature 98.9 F (37.2 C), temperature source Oral, resp. rate 17, height 5\' 7"  (1.702 m), weight 64.4 kg, SpO2 96 %. Physical Exam  Awake alert oriented x 3, no acute distress PERRLA Cranial nerves II through XII intact Bilateral upper extremities full strength throughout Speech fluent and appropriate Bilateral lower extremities full strength throughout Sensory intact to light touch  Assessment/Plan 67 year old male with  L3-4 degenerative spondylosis, stenosis with neurogenic claudication and radiculopathy  -OR today for L3-4 open laminectomy, bilateral lateral recess decompression and foraminotomies.  We discussed all risks, benefits and expected outcomes as well as alternatives to treatment.  Informed consent was obtained  and witnessed.  He has held his anticoagulation.   Thank you for allowing me to participate in this patient's care.  Please do not hesitate to call with questions or concerns.   Monia Pouch, DO Neurosurgeon [facility]  Cell: [phone]

## 2022-04-14 DIAGNOSIS — M4726 Other spondylosis with radiculopathy, lumbar region: Secondary | ICD-10-CM | POA: Diagnosis not present

## 2022-04-14 LAB — GLUCOSE, CAPILLARY: Glucose-Capillary: 153 mg/dL — ABNORMAL HIGH (ref 70–99)

## 2022-04-14 MED ORDER — OXYCODONE HCL 5 MG PO TABS: 5.0000 mg | ORAL_TABLET | Freq: Four times a day (QID) | ORAL | 0 refills | Status: AC | PRN

## 2022-04-14 MED ORDER — ASPIRIN 81 MG PO TBEC: 81.0000 mg | DELAYED_RELEASE_TABLET | Freq: Every day | ORAL | 11 refills | Status: AC

## 2022-04-14 MED ORDER — PRASUGREL HCL 10 MG PO TABS: 10.0000 mg | ORAL_TABLET | Freq: Every morning | ORAL | 0 refills | Status: AC

## 2022-04-14 NOTE — Evaluation (Signed)
Occupational Therapy Evaluation Patient Details Name: Terry Nelson MRN: [id number] DOB: 02/07/1955 Today's Date: 04/14/2022   History of Present Illness Pt is a 67 y/o M s/p L3-4 laminectomies. PMH includes CAD s/p CABGx3, CKD, DDD, DM, gastritis, GERD, HLD, HTN, MI, paroxysmal A fib   Clinical Impression   Pt reports independence at baseline with ADLs, uses cane for mobility, lives with spouse who is w/c bound, pt reports he typically has to assist her with some ADLs (toileting), but she is able to transfer to w/c independently. Pt currently needing mod I - supervision for ADLs, supervision for bed mobility, and supervision for transfers with straight cane. Pt educated on back precautions, and compensatory strategies for ADLs, pt able to verbalize and demo understanding throughout session. Pt presenting with impairments listed below, will follow acutely. Anticipate no OT follow up needs at d/c.      Recommendations for follow up therapy are one component of a multi-disciplinary discharge planning process, led by the attending physician.  Recommendations may be updated based on patient status, additional functional criteria and insurance authorization.   Assistance Recommended at Discharge PRN  Patient can return home with the following A little help with bathing/dressing/bathroom;Assistance with cooking/housework;Assist for transportation;Help with stairs or ramp for entrance;A little help with walking and/or transfers    Functional Status Assessment  Patient has had a recent decline in their functional status and demonstrates the ability to make significant improvements in function in a reasonable and predictable amount of time.  Equipment Recommendations  None recommended by OT (pt has all needed DME)    Recommendations for Other Services PT consult     Precautions / Restrictions Precautions Precautions: Back Precaution Booklet Issued: Yes (comment) Precaution Comments: pt educated  on 3/3 back precautions Required Braces or Orthoses: Other Brace Other Brace: no brace needed per MD Restrictions Weight Bearing Restrictions: No      Mobility Bed Mobility Overal bed mobility: Needs Assistance Bed Mobility: Sidelying to Sit, Sit to Sidelying, Rolling Rolling: Supervision Sidelying to sit: Supervision     Sit to sidelying: Supervision      Transfers Overall transfer level: Needs assistance Equipment used: Straight cane Transfers: Sit to/from Stand Sit to Stand: Supervision                  Balance Overall balance assessment: Mild deficits observed, not formally tested                                         ADL either performed or assessed with clinical judgement   ADL Overall ADL's : Needs assistance/impaired Eating/Feeding: Modified independent   Grooming: Modified independent   Upper Body Bathing: Supervision/ safety   Lower Body Bathing: Supervison/ safety   Upper Body Dressing : Supervision/safety   Lower Body Dressing: Supervision/safety   Toilet Transfer: Supervision/safety   Toileting- Clothing Manipulation and Hygiene: Supervision/safety       Functional mobility during ADLs: Supervision/safety;Cane       Vision Baseline Vision/History: 1 Wears glasses Vision Assessment?: No apparent visual deficits     Perception Perception Perception Tested?: No   Praxis Praxis Praxis tested?: Not tested    Pertinent Vitals/Pain Pain Assessment Pain Assessment: Faces Pain Score: 3  Faces Pain Scale: Hurts little more Pain Location: back at incision Pain Descriptors / Indicators: Discomfort Pain Intervention(s): Limited activity within patient's tolerance, Monitored during session, Repositioned  Hand Dominance     Extremity/Trunk Assessment Upper Extremity Assessment Upper Extremity Assessment: Overall WFL for tasks assessed   Lower Extremity Assessment Lower Extremity Assessment: Overall WFL for  tasks assessed   Cervical / Trunk Assessment Cervical / Trunk Assessment: Back Surgery   Communication Communication Communication: No difficulties   Cognition Arousal/Alertness: Awake/alert Behavior During Therapy: WFL for tasks assessed/performed, Flat affect Overall Cognitive Status: Within Functional Limits for tasks assessed                                       General Comments  VSS on RA    Exercises     Shoulder Instructions      Home Living Family/patient expects to be discharged to:: Private residence Living Arrangements: Spouse/significant other Available Help at Discharge: Family;Available PRN/intermittently Type of Home: House Home Access: Level entry     Home Layout: One level     Bathroom Shower/Tub: Producer, television/film/video: Standard     Home Equipment: Cane - single point;BSC/3in1;Shower seat   Additional Comments: spouse is w/c bound/bedbound per pt, she is able to transfer herself and needs some assist with ADLs, per spouse she cannot assist him      Prior Functioning/Environment Prior Level of Function : Independent/Modified Independent             Mobility Comments: cane for mobility ADLs Comments: ind        OT Problem List: Decreased strength;Decreased range of motion;Decreased activity tolerance;Impaired balance (sitting and/or standing);Decreased knowledge of precautions      OT Treatment/Interventions: Self-care/ADL training;Therapeutic exercise;Energy conservation;DME and/or AE instruction;Therapeutic activities;Patient/family education;Balance training    OT Goals(Current goals can be found in the care plan section) Acute Rehab OT Goals Patient Stated Goal: none stated OT Goal Formulation: With patient Time For Goal Achievement: 04/28/22 Potential to Achieve Goals: Good  OT Frequency: Min 2X/week    Co-evaluation              AM-PAC OT "6 Clicks" Daily Activity     Outcome Measure Help  from another person eating meals?: None Help from another person taking care of personal grooming?: None Help from another person toileting, which includes using toliet, bedpan, or urinal?: A Little Help from another person bathing (including washing, rinsing, drying)?: A Little Help from another person to put on and taking off regular upper body clothing?: A Little Help from another person to put on and taking off regular lower body clothing?: A Little 6 Click Score: 20   End of Session Equipment Utilized During Treatment: Other (comment) (cane) Nurse Communication: Mobility status  Activity Tolerance: Patient tolerated treatment well Patient left: in bed;with call bell/phone within reach  OT Visit Diagnosis: Unsteadiness on feet (R26.81);Other abnormalities of gait and mobility (R26.89);Muscle weakness (generalized) (M62.81)                Time: 9892-1194 OT Time Calculation (min): 28 min Charges:  OT General Charges $OT Visit: 1 Visit OT Evaluation $OT Eval Low Complexity: 1 Low OT Treatments $Self Care/Home Management : 8-22 mins  Carver Fila, OTD, OTR/L SecureChat Preferred Acute Rehab [phone]   Carver Fila Koonce 04/14/2022, 8:39 AM

## 2022-04-14 NOTE — Care Management (Signed)
Patient with order to DC to home today. Unit staff to provide DME needed for home.   No HH needs identified Patient will have family/ friends provide transportation home. No other TOC needs identified for DC 

## 2022-04-14 NOTE — Discharge Summary (Signed)
Physician Discharge Summary  Patient ID: Terry Nelson MRN: [id number] DOB/AGE: Jul 25, 1955 67 y.o.  Admit date: 04/13/2022 Discharge date: 04/14/2022  Admission Diagnoses:  Lumbar spinal stenosis with claudication  Discharge Diagnoses:  Same Principal Problem:   Lumbar stenosis with neurogenic claudication   Discharged Condition: Stable  Hospital Course:  Terry Nelson is a 67 y.o. male admitted after lumbar laminectomy. He reported appropriate back pain postop. He was ambulating well, tolerating diet, voiding normally and requested discharge home.  Treatments: Surgery - L3-4 laminectomy  Discharge Exam: Blood pressure (!) 94/58, pulse 69, temperature 97.8 F (36.6 C), temperature source Oral, resp. rate 20, height 5\' 7"  (1.702 m), weight 64.4 kg, SpO2 98 %. Awake, alert, oriented Speech fluent, appropriate CN grossly intact 5/5 BUE/BLE Wound c/d/i  Disposition: Discharge disposition: 01-Home or Self Care       Discharge Instructions     Call MD for:  redness, tenderness, or signs of infection (pain, swelling, redness, odor or green/yellow discharge around incision site)   Complete by: As directed    Call MD for:  temperature >100.4   Complete by: As directed    Diet - low sodium heart healthy   Complete by: As directed    Discharge instructions   Complete by: As directed    Walk at home as much as possible, at least 4 times / day   Incentive spirometry RT   Complete by: As directed    Increase activity slowly   Complete by: As directed    Lifting restrictions   Complete by: As directed    No lifting > 10 lbs   May shower / Bathe   Complete by: As directed    48 hours after surgery   May walk up steps   Complete by: As directed    Other Restrictions   Complete by: As directed    No bending/twisting at waist      Allergies as of 04/14/2022       Reactions   Metoclopramide Other (See Comments)   DYSKINESIAS contractions of whole body   Penicillins  Swelling, Rash   Did it involve swelling of the face/tongue/throat, SOB, or low BP? Yes Did it involve sudden or severe rash/hives, skin peeling, or any reaction on the inside of your mouth or nose? Unknown Did you need to seek medical attention at a hospital or doctor's office? Yes When did it last happen?More than 40 years ago    If all above answers are "NO", may proceed with cephalosporin use.        Medication List     TAKE these medications    aspirin EC 81 MG tablet Take 1 tablet (81 mg total) by mouth daily. Swallow whole. Start taking on: April 18, 2022 What changed: These instructions start on April 18, 2022. If you are unsure what to do until then, ask your doctor or other care provider.   atorvastatin 80 MG tablet Commonly known as: LIPITOR Take 80 mg by mouth at bedtime.   BD Pen Needle Nano U/F 32G X 4 MM Misc Generic drug: Insulin Pen Needle USE ONCE AS DIRECTED   Blink Tears 0.25 % Soln Generic drug: Polyethylene Glycol 400 Place 1 drop into both eyes daily as needed (dry eyes).   Contour Next Monitor w/Device Kit daily.   Contour Next Test test strip Generic drug: glucose blood by Does not apply route.   dapagliflozin propanediol 10 MG Tabs tablet Commonly known as: FARXIGA Take 1  tablet (10 mg total) by mouth daily.   furosemide 40 MG tablet Commonly known as: LASIX Take 40mg  (1 tablet) daily. Take additional 40mg  as needed for weight gain of 2 pounds overnight or 5 pounds in one week.   gabapentin 300 MG capsule Commonly known as: NEURONTIN Take 1,500 mg by mouth in the morning and at bedtime.   isosorbide mononitrate 60 MG 24 hr tablet Commonly known as: IMDUR Take 2 tablets (120 mg total) by mouth daily.   losartan 25 MG tablet Commonly known as: COZAAR Take 1 tablet (25 mg total) by mouth daily.   magnesium oxide 400 MG tablet Commonly known as: MAG-OX Take 800 mg by mouth every morning.   metoprolol 200 MG 24 hr tablet Commonly  known as: Toprol XL Take 1 tablet (200 mg total) by mouth daily.   nitroGLYCERIN 0.4 MG SL tablet Commonly known as: NITROSTAT Place 1 tablet (0.4 mg total) under the tongue every 5 (five) minutes as needed for chest pain.   oxyCODONE 5 MG immediate release tablet Commonly known as: Oxy IR/ROXICODONE Take 1 tablet (5 mg total) by mouth every 6 (six) hours as needed for up to 7 days for moderate pain ((score 4 to 6)).   oxyCODONE-acetaminophen 10-325 MG tablet Commonly known as: PERCOCET Take 1 tablet by mouth every 4 (four) hours as needed for pain.   pantoprazole 40 MG tablet Commonly known as: PROTONIX Take 40 mg by mouth every morning.   potassium chloride 10 MEQ tablet Commonly known as: KLOR-CON M Take 1 tablet (10 mEq total) by mouth daily. What changed: when to take this   prasugrel 10 MG Tabs tablet Commonly known as: EFFIENT Take 1 tablet (10 mg total) by mouth every morning.   promethazine 25 MG tablet Commonly known as: PHENERGAN Take 25 mg by mouth 2 (two) times daily as needed for nausea or vomiting (Headache).   ranolazine 500 MG 12 hr tablet Commonly known as: Ranexa Take 1 tablet (500 mg total) by mouth 2 (two) times daily.   spironolactone 25 MG tablet Commonly known as: ALDACTONE Take 25 mg by mouth every morning.   tiZANidine 4 MG tablet Commonly known as: ZANAFLEX Take 4-8 mg by mouth See admin instructions. Take one tablet (4 mg) by mouth daily with supper, take two tablets (8 mg) daily at bedtime, may also take one tablet (4 mg) in the morning   traZODone 50 MG tablet Commonly known as: DESYREL Take 50 mg by mouth at bedtime.   Evaristo Bury FlexTouch 100 UNIT/ML FlexTouch Pen Generic drug: insulin degludec Inject 36 Units into the skin daily.        Follow-up Information     Dawley, Troy C, DO Follow up.   Contact information: [address] [place] [address] [place] [address] [phone]                 Signed: Jackelyn Hoehn 04/14/2022, 8:57 AM

## 2022-04-14 NOTE — Plan of Care (Signed)
  Problem: Education: Goal: Ability to verbalize activity precautions or restrictions will improve Outcome: Completed/Met Goal: Knowledge of the prescribed therapeutic regimen will improve Outcome: Completed/Met Goal: Understanding of discharge needs will improve Outcome: Completed/Met   Problem: Activity: Goal: Ability to avoid complications of mobility impairment will improve Outcome: Completed/Met Goal: Ability to tolerate increased activity will improve Outcome: Completed/Met Goal: Will remain free from falls Outcome: Completed/Met   Problem: Bowel/Gastric: Goal: Gastrointestinal status for postoperative course will improve Outcome: Completed/Met   Problem: Clinical Measurements: Goal: Ability to maintain clinical measurements within normal limits will improve Outcome: Completed/Met Goal: Postoperative complications will be avoided or minimized Outcome: Completed/Met Goal: Diagnostic test results will improve Outcome: Completed/Met   Problem: Pain Management: Goal: Pain level will decrease Outcome: Completed/Met   Problem: Skin Integrity: Goal: Will show signs of wound healing Outcome: Completed/Met   Problem: Health Behavior/Discharge Planning: Goal: Identification of resources available to assist in meeting health care needs will improve Outcome: Completed/Met   Problem: Bladder/Genitourinary: Goal: Urinary functional status for postoperative course will improve Outcome: Completed/Met Patient alert and oriented, void, ambulate. Surgical site clean and dry. D/c instructions explain and given.

## 2022-04-14 NOTE — Anesthesia Postprocedure Evaluation (Signed)
Anesthesia Post Note  Patient: Terry Nelson  Procedure(s) Performed: LUMBAR THREE-FOUR LAMINECTOMY AND FORAMINOTOMY (Spine Lumbar)     Patient location during evaluation: PACU Anesthesia Type: General Level of consciousness: awake and alert Pain management: pain level controlled Vital Signs Assessment: post-procedure vital signs reviewed and stable Respiratory status: spontaneous breathing, nonlabored ventilation, respiratory function stable and patient connected to nasal cannula oxygen Cardiovascular status: blood pressure returned to baseline and stable Postop Assessment: no apparent nausea or vomiting Anesthetic complications: no   No notable events documented.  Last Vitals:  Vitals:   04/14/22 0516 04/14/22 0708  BP: (!) 86/58 (!) 94/58  Pulse: 71 69  Resp: 16 20  Temp: 36.6 C 36.6 C  SpO2: 97% 98%    Last Pain:  Vitals:   04/14/22 0708  TempSrc: Oral  PainSc:                  Collene Schlichter

## 2022-04-15 ENCOUNTER — Encounter (HOSPITAL_COMMUNITY): Payer: Self-pay | Admitting: Neurological Surgery

## 2022-05-02 ENCOUNTER — Other Ambulatory Visit: Payer: Self-pay | Admitting: *Deleted

## 2022-05-02 MED ORDER — DAPAGLIFLOZIN PROPANEDIOL 10 MG PO TABS: 10.0000 mg | ORAL_TABLET | Freq: Every day | ORAL | 3 refills | Status: DC

## 2022-06-07 ENCOUNTER — Other Ambulatory Visit: Payer: Self-pay

## 2022-06-07 MED ORDER — POTASSIUM CHLORIDE CRYS ER 10 MEQ PO TBCR: 10.0000 meq | EXTENDED_RELEASE_TABLET | Freq: Every day | ORAL | 3 refills | Status: DC

## 2022-06-20 ENCOUNTER — Telehealth: Payer: Self-pay | Admitting: Internal Medicine

## 2022-06-20 NOTE — Telephone Encounter (Signed)
Received call transferred directly from operator and spoke with patient.  He reports he began having episodes of chest pain about 2 weeks ago.  They are becoming more frequent and requiring more NTG to relieve the pain.  Last episode of pain was last night.  He reports for the last 4 days pain has been occurring every day.  He is unable to tell how often but reports it is at least 2-3 times per day.  He thought he had an upcoming appointment in our office and I let him know his next scheduled appointment is 9/20.   Patient on Imdur and Ranexa.  I explained to patient due to increasing frequency and worsening of chest pain he should go to ED for evaluation.  Patient does not want to go to ED and I was not able to determine if he would go.

## 2022-06-20 NOTE — Telephone Encounter (Signed)
I called and spoke w the patient.  He did not go to hospital for the chest pain even though he does feel it is his heart and he would bet "something is closing up in there".    He is frustrated with going to the hospital, waiting a long time.    I offered him an APP appointment w [facility] on 6/18 but he will be in [place] to see his PCP and is not sure what day he is returning.  I asked him to call back when he knows so we can try to get him seen as soon as possible.

## 2022-06-20 NOTE — Telephone Encounter (Signed)
Pt c/o of Chest Pain: STAT if CP now or developed within 24 hours  1. Are you having CP right now? Not right now but he was last night   2. Are you experiencing any other symptoms (ex. SOB, nausea, vomiting, sweating)? SOB, Nausea, and fatigue sometimes   3. How long have you been experiencing CP? 2 weeks or so   4. Is your CP continuous or coming and going? Coming and going   5. Have you taken Nitroglycerin? Yes, it would chase the pain away and then come back  ?

## 2022-07-30 ENCOUNTER — Encounter: Payer: Self-pay | Admitting: Cardiovascular Disease

## 2022-07-30 ENCOUNTER — Telehealth: Payer: Self-pay | Admitting: Internal Medicine

## 2022-07-30 ENCOUNTER — Ambulatory Visit: Payer: Managed Care, Other (non HMO) | Attending: Cardiovascular Disease | Admitting: Cardiovascular Disease

## 2022-07-30 VITALS — BP 132/70 | HR 52 | Ht 67.0 in | Wt 142.6 lb

## 2022-07-30 DIAGNOSIS — R079 Chest pain, unspecified: Secondary | ICD-10-CM | POA: Diagnosis not present

## 2022-07-30 DIAGNOSIS — I25110 Atherosclerotic heart disease of native coronary artery with unstable angina pectoris: Secondary | ICD-10-CM

## 2022-07-30 DIAGNOSIS — Z01812 Encounter for preprocedural laboratory examination: Secondary | ICD-10-CM

## 2022-07-30 NOTE — Telephone Encounter (Signed)
Pt c/o of Chest Pain: STAT if CP now or developed within 24 hours  1. Are you having CP right now?  No   2. Are you experiencing any other symptoms (ex. SOB, nausea, vomiting, sweating)?  Chest pressure   3. How long have you been experiencing CP?  A couple of weeks, possibly longer   4. Is your CP continuous or coming and going?  Coming and going   5. Have you taken Nitroglycerin?  Yes  ?

## 2022-07-30 NOTE — Telephone Encounter (Signed)
Called patient back about his message. Patient has history of CAD with multiple stents. Patient complaining of chest pressure that is relieved by nitroglycerin. Patient on Imdur 120 mg daily. Patient stated this has been going on for about 3 weeks or longer. Patient is not having any current chest pain. Made patient an appointment with DOD, Dr. Clifton James for further evaluation. Encouraged patient to go to ED if his chest pain is not relieved with nitroglycerin or his symptoms get worse. Patient verbalized understanding.

## 2022-07-30 NOTE — H&P (View-Only) (Signed)
Chief Complaint  Patient presents with   Follow-up    CAD    History of Present Illness: 67 yo male with history of CAD s/p 3V CABG, CKD, chronic diastolic CHF, DM, GERD, HTN, HLD and paroxysmal atrial fibrillation who is here today for unplanned follow up. He is now followed in our office by Dr. Roby Lofts. He had been followed previously by Dr. Katrinka Blazing. He is known to have CAD with prior 3V CABG in 2002. He has undergone multiple stenting procedures in the LAD and has multiple layers of stents in the LAD. He also has prior stenting of the distal RCA/PDA. Last cardiac cath in May 2023 with patent LAD but high grade stenosis in the stented segment of distal LAD. This was treated with cutting balloon angioplasty with sub-optimal result (stenosis taken from 95% down to 75%). The LIMA to the LAD is atretic. The SVG both OM branches is patent. Moderate distal RCA stenosis with patent distal RCA stent into the PDA. Echo January 2023 with normal LV systolic function. Mild MR.   He tells me today that he has been having severe daily chest pain for the past weeks. There is associated dyspnea. His pain is more   Primary Care Physician: Jiles Harold, PA-C   Past Medical History:  Diagnosis Date   Anxiety state    pt denies   Body mass index (bmi) 26.0-26.9, adult    CAD (coronary artery disease)    a. 2002 s/p CABG x 3 (LIMA->LAD, VG->OM1->OM2); b. s/p prior LAD and dRCA/RPDA stenting; c. 04/2019 PTCA & lithotripsy of ISR throughout LAD; d. 08/2020 PCI/scoring balloon PTCA throughout LAD 2/2 ISR; e. 09/2020 Cath: LM nl, LAD 40ost/p, 40p/d, 75d ISR (FFR 0.86), 50d, LCX small, OM1 90, OM2 25, RCA 20d ISR, RPDA 20 ISR, VG->OM1->OM2 ok, LIMA->LAD 193m-->Med rx.   Chronic heart failure with preserved ejection fraction (HFpEF) (HCC)    a. 09/2020 Echo: Ef 60-65%, no rwma, nl RV size/fxn. Mod dil LA. triv MR. Mild AoV sclerosis w/o stenosis.   CKD (chronic kidney disease), stage II    DDD (degenerative disc  disease), lumbar    DM (diabetes mellitus) (HCC)    Gastritis    GERD (gastroesophageal reflux disease)    Headache    Hemorrhage    History of kidney stones    Hyperlipidemia    Hypertension    Kidney stones    Lumbago    Myocardial infarction [facility])    Neck pain    Paroxysmal A-fib (HCC)    a. CHA2DS2VASc = 4-->Eliquis; b. 06/2020 Zio: No afib. Up to 20 beats PSVT.   Pernicious anemia    Unstable angina (HCC)     Past Surgical History:  Procedure Laterality Date   ABLATION  2017   CARDIAC CATHETERIZATION     CORONARY ARTERY BYPASS GRAFT     CORONARY BALLOON ANGIOPLASTY N/A 04/27/2019   Procedure: CORONARY BALLOON ANGIOPLASTY;  Surgeon: Yvonne Kendall, MD;  [facility]: [facility];  Service: Cardiovascular;  Laterality: N/A;   CORONARY BALLOON ANGIOPLASTY N/A 12/16/2019   Procedure: CORONARY BALLOON ANGIOPLASTY;  Surgeon: Lyn Records, MD;  Location: [facility];  Service: Cardiovascular;  Laterality: N/A;   CORONARY BALLOON ANGIOPLASTY N/A 09/06/2020   Procedure: CORONARY BALLOON ANGIOPLASTY;  Surgeon: Lyn Records, MD;  Location: [facility];  Service: Cardiovascular;  Laterality: N/A;   CORONARY BALLOON ANGIOPLASTY N/A 06/02/2021   Procedure: CORONARY BALLOON ANGIOPLASTY;  Surgeon: Lyn Records,  MD;  [facility]: [facility];  Service: Cardiovascular;  Laterality: N/A;   CORONARY PRESSURE/FFR STUDY N/A 09/30/2020   Procedure: INTRAVASCULAR PRESSURE WIRE/FFR STUDY;  Surgeon: [name], MD;  [facility]: [facility];  Service: Cardiovascular;  Laterality: N/A;   CORONARY PRESSURE/FFR STUDY N/A 06/02/2021   Procedure: INTRAVASCULAR PRESSURE WIRE/FFR STUDY;  Surgeon: Lyn Records, MD;  [facility];  Service: Cardiovascular;  Laterality: N/A;   CORONARY ULTRASOUND/IVUS N/A 04/27/2019   Procedure: Intravascular Ultrasound/IVUS;  Surgeon: Yvonne Kendall, MD;  [facility];  Service: Cardiovascular;   Laterality: N/A;   CORONARY ULTRASOUND/IVUS N/A 12/16/2019   Procedure: Intravascular Ultrasound/IVUS;  Surgeon: Lyn Records, MD;  Location[facility] CV [facility];  Service: Cardiovascular;  Laterality: N/A;   KNEE ARTHROSCOPY Bilateral    3 times on each knee   LEFT HEART CATH AND CORS/GRAFTS ANGIOGRAPHY N/A 04/09/2019   Procedure: LEFT HEART CATH AND CORS/GRAFTS ANGIOGRAPHY;  Surgeon: Lyn Records, MD;  [facility];  Service: Cardiovascular;  Laterality: N/A;   LEFT HEART CATH AND CORS/GRAFTS ANGIOGRAPHY N/A 12/16/2019   Procedure: LEFT HEART CATH AND CORS/GRAFTS ANGIOGRAPHY;  Surgeon: Lyn Records, MD;  [facility];  Service: Cardiovascular;  Laterality: N/A;   LEFT HEART CATH AND CORS/GRAFTS ANGIOGRAPHY N/A 09/06/2020   Procedure: LEFT HEART CATH AND CORS/GRAFTS ANGIOGRAPHY;  Surgeon: Lyn Records, MD;  [facility];  Service: Cardiovascular;  Laterality: N/A;   LEFT HEART CATH AND CORS/GRAFTS ANGIOGRAPHY N/A 09/30/2020   Procedure: LEFT HEART CATH AND CORS/GRAFTS ANGIOGRAPHY;  Surgeon: [name], MD;  [facility];  Service: Cardiovascular;  Laterality: N/A;   LEFT HEART CATH AND CORS/GRAFTS ANGIOGRAPHY N/A 06/02/2021   Procedure: LEFT HEART CATH AND CORS/GRAFTS ANGIOGRAPHY;  Surgeon: Lyn Records, MD;  [facility];  Service: Cardiovascular;  Laterality: N/A;   LUMBAR FUSION     L5-S1   LUMBAR LAMINECTOMY/DECOMPRESSION MICRODISCECTOMY N/A 04/13/2022   Procedure: LUMBAR THREE-FOUR LAMINECTOMY AND FORAMINOTOMY;  Surgeon: Dawley, Alan Mulder, DO;  Location: [facility];  Service: Neurosurgery;  Laterality: N/A;  3C   LUMBAR PERITONEAL SHUNT     pt states "this is blocked"   SHOULDER ARTHROSCOPY Bilateral     Current Outpatient Medications  Medication Sig Dispense Refill   aspirin EC 81 MG tablet Take 1 tablet (81 mg total) by mouth daily. Swallow whole. 30 tablet 11   atorvastatin (LIPITOR) 80 MG tablet  Take 80 mg by mouth at bedtime.     Blood Glucose Monitoring Suppl (CONTOUR NEXT MONITOR) w/Device KIT daily.     dapagliflozin propanediol (FARXIGA) 10 MG TABS tablet Take 1 tablet (10 mg total) by mouth daily. 90 tablet 3   furosemide (LASIX) 40 MG tablet Take 40mg  (1 tablet) daily. Take additional 40mg  as needed for weight gain of 2 pounds overnight or 5 pounds in one week. (Patient taking differently: 20 mg. Take 40mg  (1 tablet) daily. Take additional 40mg  as needed for weight gain of 2 pounds overnight or 5 pounds in one week.) 45 tablet 9   gabapentin (NEURONTIN) 300 MG capsule Take 1,500 mg by mouth in the morning and at bedtime.     glucose blood (CONTOUR NEXT TEST) test strip by Does not apply route.     Insulin Pen Needle (BD PEN NEEDLE NANO U/F) 32G X 4 MM MISC USE ONCE AS DIRECTED     isosorbide  mononitrate (IMDUR) 60 MG 24 hr tablet Take 2 tablets (120 mg total) by mouth daily. 180 tablet 3   losartan (COZAAR) 25 MG tablet Take 1 tablet (25 mg total) by mouth daily. 90 tablet 3   magnesium oxide (MAG-OX) 400 MG tablet Take 800 mg by mouth every morning.     metoprolol (TOPROL XL) 200 MG 24 hr tablet Take 1 tablet (200 mg total) by mouth daily. 90 tablet 3   nitroGLYCERIN (NITROSTAT) 0.4 MG SL tablet Place 1 tablet (0.4 mg total) under the tongue every 5 (five) minutes as needed for chest pain. 100 tablet 2   oxyCODONE-acetaminophen (PERCOCET) 10-325 MG tablet Take 1 tablet by mouth every 4 (four) hours as needed for pain.     pantoprazole (PROTONIX) 40 MG tablet Take 40 mg by mouth every morning.     Polyethylene Glycol 400 (BLINK TEARS) 0.25 % SOLN Place 1 drop into both eyes daily as needed (dry eyes).     potassium chloride (KLOR-CON M) 10 MEQ tablet Take 1 tablet (10 mEq total) by mouth daily. 90 tablet 3   prasugrel (EFFIENT) 10 MG TABS tablet Take 1 tablet (10 mg total) by mouth every morning. 30 tablet 0   promethazine (PHENERGAN) 25 MG tablet Take 25 mg by mouth 2 (two) times  daily as needed for nausea or vomiting (Headache).     ranolazine (RANEXA) 500 MG 12 hr tablet Take 1 tablet (500 mg total) by mouth 2 (two) times daily. 180 tablet 3   spironolactone (ALDACTONE) 25 MG tablet Take 25 mg by mouth every morning.     tiZANidine (ZANAFLEX) 4 MG tablet Take 4-8 mg by mouth See admin instructions. Take one tablet (4 mg) by mouth daily with supper, take two tablets (8 mg) daily at bedtime, may also take one tablet (4 mg) in the morning     traZODone (DESYREL) 50 MG tablet Take 50 mg by mouth at bedtime.     TRESIBA FLEXTOUCH 100 UNIT/ML FlexTouch Pen Inject 36 Units into the skin daily.     No current facility-administered medications for this visit.    Allergies  Allergen Reactions   Metoclopramide Other (See Comments)    DYSKINESIAS contractions of whole body   Penicillins Swelling and Rash    Did it involve swelling of the face/tongue/throat, SOB, or low BP? Yes Did it involve sudden or severe rash/hives, skin peeling, or any reaction on the inside of your mouth or nose? Unknown Did you need to seek medical attention at a hospital or doctor's office? Yes When did it last happen?More than 40 years ago    If all above answers are "NO", may proceed with cephalosporin use.     Social History   Socioeconomic History   Marital status: Married    Spouse name: Not on file   Number of children: 2   Years of education: Not on file   Highest education level: Not on file  Occupational History   Not on file  Tobacco Use   Smoking status: Never   Smokeless tobacco: Former    Types: Chew   Tobacco comments:    per pt chewed tobacco in high school   Vaping Use   Vaping status: Never Used  Substance and Sexual Activity   Alcohol use: Never   Drug use: Never   Sexual activity: Not on file    Comment: MARRIED  Other Topics Concern   Not on file  Social History Narrative   Not  on file   Social Determinants of Health   Financial Resource Strain: Not on  file  Food Insecurity: Not on file  Transportation Needs: Not on file  Physical Activity: Not on file  Stress: Not on file  Social Connections: Not on file  Intimate Partner Violence: Not on file    Family History  Problem Relation Age of Onset   High blood pressure Mother    Stroke Mother    Diabetes Father    Heart disease Other     Review of Systems:  As stated in the HPI and otherwise negative.   BP 132/70   Pulse (!) 52   Ht 5\' 7"  (1.702 m)   Wt 64.7 kg   SpO2 98%   BMI 22.33 kg/m   Physical Examination: General: Well developed, well nourished, NAD  HEENT: OP clear, mucus membranes moist  SKIN: warm, dry. No rashes. Neuro: No focal deficits  Musculoskeletal: Muscle strength 5/5 all ext  Psychiatric: Mood and affect normal  Neck: No JVD, no carotid bruits, no thyromegaly, no lymphadenopathy.  Lungs:Clear bilaterally, no wheezes, rhonci, crackles Cardiovascular: Regular rate and rhythm. No murmurs, gallops or rubs. Abdomen:Soft. Bowel sounds present. Non-tender.  Extremities: No lower extremity edema. Pulses are 2 + in the bilateral DP/PT.  EKG:  EKG is ordered today. The ekg ordered today demonstrates  EKG Interpretation Date/Time:  Monday July 30 2022 16:27:00 EDT Ventricular Rate:  52 PR Interval:  116 QRS Duration:  90 QT Interval:  436 QTC Calculation: 405 R Axis:   62  Text Interpretation: Sinus bradycardia ST & T wave abnormality, consider lateral ischemia When compared with ECG of 03-Jun-2021 06:24, Nonspecific T wave abnormality, improved in Inferior leads Inverted T waves have replaced nonspecific T wave abnormality in Lateral leads Confirmed by Verne Carrow [phone]) on 07/30/2022 4:30:21 PM    Recent Labs: 04/05/2022: ALT 12; BUN 22; Creatinine, Ser 1.31; Hemoglobin 14.0; Platelets 172; Potassium 4.8; Sodium 138   Lipid Panel    Component Value Date/Time   CHOL 141 04/05/2022 1158   TRIG 81 04/05/2022 1158   HDL 64 04/05/2022 1158    CHOLHDL 2.2 04/05/2022 1158   CHOLHDL 2.4 02/02/2021 0358   VLDL 14 02/02/2021 0358   LDLCALC 61 04/05/2022 1158    Wt Readings from Last 3 Encounters:  07/30/22 64.7 kg  04/13/22 64.4 kg  04/05/22 65.4 kg    Assessment and Plan:   1. CAD s/p CABG with unstable angina: He is having unstable angina. Cardiac catheterization is indicated. I am not sure that we will have any good options for treatment given the multiple layers of stents in the LAD but we can explore treatment options once his coronary anatomy is visualized. He has had prior Shockwave lithotripsy of the LAD in 2022 but Dr. Katrinka Blazing was unable to fully expand the stent.   I have reviewed the risks, indications, and alternatives to cardiac catheterization, possible angioplasty, and stenting with the patient. Risks include but are not limited to bleeding, infection, vascular injury, stroke, myocardial infection, arrhythmia, kidney injury, radiation-related injury in the case of prolonged fluoroscopy use, emergency cardiac surgery, and death. The patient understands the risks of serious complication is 1-2 in 1000 with diagnostic cardiac cath and 1-2% or less with angioplasty/stenting. BMET and CBC today.    Labs/ tests ordered today include:   Orders Placed This Encounter  Procedures   EKG 12-Lead   Disposition:   F/U with Dr. Lynnette Caffey or office APP in 2-4  weeks.    Signed, Verne Carrow, MD, [facility] 07/30/2022 5:01 PM    [facility] [address] [place], [place], [place]  [address] Phone: [phone]; Fax: [phone]

## 2022-07-30 NOTE — Patient Instructions (Addendum)
Medication Instructions:  No changes *If you need a refill on your cardiac medications before your next appointment, please call your pharmacy*   Lab Work: TO BE DRAWN AT THE HOSPITAL   Testing/Procedures: Your physician has requested that you have a cardiac catheterization. Cardiac catheterization is used to diagnose and/or treat various heart conditions. Doctors may recommend this procedure for a number of different reasons. The most common reason is to evaluate chest pain. Chest pain can be a symptom of coronary artery disease (CAD), and cardiac catheterization can show whether plaque is narrowing or blocking your heart's arteries. This procedure is also used to evaluate the valves, as well as measure the blood flow and oxygen levels in different parts of your heart. For further information please visit [email or web address] Please follow instruction sheet, as given.          Cardiac/Peripheral Catheterization   You are scheduled for a Cardiac Catheterization on Tuesday, July 23 with Dr. Verne Carrow.  1. Please arrive at the [facility] (Main Entrance A) at [facility]: [address] [place], [place] [address] at 7:00 AM (This time is TWO hour(s) before your procedure to ensure your preparation). Free valet parking service is available. You will check in at ADMITTING. The support person will be asked to wait in the waiting room.  It is OK to have someone drop you off and come back when you are ready to be discharged.        Special note: Every effort is made to have your procedure done on time. Please understand that emergencies sometimes delay scheduled procedures.  2. Diet: Do not eat solid foods after midnight.  You may have clear liquids until 5 AM the day of the procedure.  3. Labs: You will need to have blood drawn TOMORROW MORNING WHEN YOU COME TO THE HOSPITAL  4. Medication instructions in preparation for your procedure:   Contrast Allergy: No   HOLD  LASIX TOMORROW MORNING   On the morning of your procedure, take Aspirin 81 mg and Effient/Prasugrel and any morning medicines NOT listed above.  You may use sips of water.  5. Plan to go home the same day, you will only stay overnight if medically necessary. 6. You MUST have a responsible adult to drive you home. 7. An adult MUST be with you the first 24 hours after you arrive home. 8. Bring a current list of your medications, and the last time and date medication taken. 9. Bring ID and current insurance cards. 10.Please wear clothes that are easy to get on and off and wear slip-on shoes.  Thank you for allowing [place] to care for you!   -- [place] [facility]

## 2022-07-30 NOTE — Progress Notes (Signed)
Chief Complaint  Patient presents with   Follow-up    CAD    History of Present Illness: 67 yo male with history of CAD s/p 3V CABG, CKD, chronic diastolic CHF, DM, GERD, HTN, HLD and paroxysmal atrial fibrillation who is here today for unplanned follow up. He is now followed in our office by Dr. Roby Lofts. He had been followed previously by Dr. Katrinka Blazing. He is known to have CAD with prior 3V CABG in 2002. He has undergone multiple stenting procedures in the LAD and has multiple layers of stents in the LAD. He also has prior stenting of the distal RCA/PDA. Last cardiac cath in May 2023 with patent LAD but high grade stenosis in the stented segment of distal LAD. This was treated with cutting balloon angioplasty with sub-optimal result (stenosis taken from 95% down to 75%). The LIMA to the LAD is atretic. The SVG both OM branches is patent. Moderate distal RCA stenosis with patent distal RCA stent into the PDA. Echo January 2023 with normal LV systolic function. Mild MR.   He tells me today that he has been having severe daily chest pain for the past weeks. There is associated dyspnea. His pain is more severe than his baseline pain and similar to the pain he has when he has had stent restenosis.   Primary Care Physician: Jiles Harold, PA-C   Past Medical History:  Diagnosis Date   Anxiety state    pt denies   Body mass index (bmi) 26.0-26.9, adult    CAD (coronary artery disease)    a. 2002 s/p CABG x 3 (LIMA->LAD, VG->OM1->OM2); b. s/p prior LAD and dRCA/RPDA stenting; c. 04/2019 PTCA & lithotripsy of ISR throughout LAD; d. 08/2020 PCI/scoring balloon PTCA throughout LAD 2/2 ISR; e. 09/2020 Cath: LM nl, LAD 40ost/p, 40p/d, 75d ISR (FFR 0.86), 50d, LCX small, OM1 90, OM2 25, RCA 20d ISR, RPDA 20 ISR, VG->OM1->OM2 ok, LIMA->LAD 133m-->Med rx.   Chronic heart failure with preserved ejection fraction (HFpEF) (HCC)    a. 09/2020 Echo: Ef 60-65%, no rwma, nl RV size/fxn. Mod dil LA. triv MR. Mild AoV  sclerosis w/o stenosis.   CKD (chronic kidney disease), stage II    DDD (degenerative disc disease), lumbar    DM (diabetes mellitus) (HCC)    Gastritis    GERD (gastroesophageal reflux disease)    Headache    Hemorrhage    History of kidney stones    Hyperlipidemia    Hypertension    Kidney stones    Lumbago    Myocardial infarction [facility])    Neck pain    Paroxysmal A-fib (HCC)    a. CHA2DS2VASc = 4-->Eliquis; b. 06/2020 Zio: No afib. Up to 20 beats PSVT.   Pernicious anemia    Unstable angina (HCC)     Past Surgical History:  Procedure Laterality Date   ABLATION  2017   CARDIAC CATHETERIZATION     CORONARY ARTERY BYPASS GRAFT     CORONARY BALLOON ANGIOPLASTY N/A 04/27/2019   Procedure: CORONARY BALLOON ANGIOPLASTY;  Surgeon: Yvonne Kendall, MD;  Location: [facility];  Service: Cardiovascular;  Laterality: N/A;   CORONARY BALLOON ANGIOPLASTY N/A 12/16/2019   Procedure: CORONARY BALLOON ANGIOPLASTY;  Surgeon: Lyn Records, MD;  Location: [facility];  Service: Cardiovascular;  Laterality: N/A;   CORONARY BALLOON ANGIOPLASTY N/A 09/06/2020   Procedure: CORONARY BALLOON ANGIOPLASTY;  Surgeon: Lyn Records, MD;  Location: [facility];  Service: Cardiovascular;  Laterality: N/A;  CORONARY BALLOON ANGIOPLASTY N/A 06/02/2021   Procedure: CORONARY BALLOON ANGIOPLASTY;  Surgeon: Lyn Records, MD;  Location: [facility] INVASIVE CV LAB;  Service: Cardiovascular;  Laterality: N/A;   CORONARY PRESSURE/FFR STUDY N/A 09/30/2020   Procedure: INTRAVASCULAR PRESSURE WIRE/FFR STUDY;  Surgeon: [name], MD;  Location: [facility];  Service: Cardiovascular;  Laterality: N/A;   CORONARY PRESSURE/FFR STUDY N/A 06/02/2021   Procedure: INTRAVASCULAR PRESSURE WIRE/FFR STUDY;  Surgeon: Lyn Records, MD;  Location: [facility];  Service: Cardiovascular;  Laterality: N/A;   CORONARY ULTRASOUND/IVUS N/A 04/27/2019   Procedure: Intravascular Ultrasound/IVUS;   Surgeon: Yvonne Kendall, MD;  Location: [facility];  Service: Cardiovascular;  Laterality: N/A;   CORONARY ULTRASOUND/IVUS N/A 12/16/2019   Procedure: Intravascular Ultrasound/IVUS;  Surgeon: Lyn Records, MD;  Location: [facility] INVASIVE CV LAB;  Service: Cardiovascular;  Laterality: N/A;   KNEE ARTHROSCOPY Bilateral    3 times on each knee   LEFT HEART CATH AND CORS/GRAFTS ANGIOGRAPHY N/A 04/09/2019   Procedure: LEFT HEART CATH AND CORS/GRAFTS ANGIOGRAPHY;  Surgeon: Lyn Records, MD;  Location: [facility];  Service: Cardiovascular;  Laterality: N/A;   LEFT HEART CATH AND CORS/GRAFTS ANGIOGRAPHY N/A 12/16/2019   Procedure: LEFT HEART CATH AND CORS/GRAFTS ANGIOGRAPHY;  Surgeon: Lyn Records, MD;  Location: [facility];  Service: Cardiovascular;  Laterality: N/A;   LEFT HEART CATH AND CORS/GRAFTS ANGIOGRAPHY N/A 09/06/2020   Procedure: LEFT HEART CATH AND CORS/GRAFTS ANGIOGRAPHY;  Surgeon: Lyn Records, MD;  Location: [facility];  Service: Cardiovascular;  Laterality: N/A;   LEFT HEART CATH AND CORS/GRAFTS ANGIOGRAPHY N/A 09/30/2020   Procedure: LEFT HEART CATH AND CORS/GRAFTS ANGIOGRAPHY;  Surgeon: [name], MD;  Location: [facility];  Service: Cardiovascular;  Laterality: N/A;   LEFT HEART CATH AND CORS/GRAFTS ANGIOGRAPHY N/A 06/02/2021   Procedure: LEFT HEART CATH AND CORS/GRAFTS ANGIOGRAPHY;  Surgeon: Lyn Records, MD;  Location: [facility];  Service: Cardiovascular;  Laterality: N/A;   LUMBAR FUSION     L5-S1   LUMBAR LAMINECTOMY/DECOMPRESSION MICRODISCECTOMY N/A 04/13/2022   Procedure: LUMBAR THREE-FOUR LAMINECTOMY AND FORAMINOTOMY;  Surgeon: Dawley, Alan Mulder, DO;  Location: [facility] OR;  Service: Neurosurgery;  Laterality: N/A;  3C   LUMBAR PERITONEAL SHUNT     pt states "this is blocked"   SHOULDER ARTHROSCOPY Bilateral     Current Outpatient Medications  Medication Sig Dispense Refill   aspirin EC 81 MG tablet Take 1 tablet (81 mg  total) by mouth daily. Swallow whole. 30 tablet 11   atorvastatin (LIPITOR) 80 MG tablet Take 80 mg by mouth at bedtime.     Blood Glucose Monitoring Suppl (CONTOUR NEXT MONITOR) w/Device KIT daily.     dapagliflozin propanediol (FARXIGA) 10 MG TABS tablet Take 1 tablet (10 mg total) by mouth daily. 90 tablet 3   furosemide (LASIX) 40 MG tablet Take 40mg  (1 tablet) daily. Take additional 40mg  as needed for weight gain of 2 pounds overnight or 5 pounds in one week. (Patient taking differently: 20 mg. Take 40mg  (1 tablet) daily. Take additional 40mg  as needed for weight gain of 2 pounds overnight or 5 pounds in one week.) 45 tablet 9   gabapentin (NEURONTIN) 300 MG capsule Take 1,500 mg by mouth in the morning and at bedtime.     glucose blood (CONTOUR NEXT TEST) test strip by Does not apply route.     Insulin Pen Needle (BD PEN NEEDLE  NANO U/F) 32G X 4 MM MISC USE ONCE AS DIRECTED     isosorbide mononitrate (IMDUR) 60 MG 24 hr tablet Take 2 tablets (120 mg total) by mouth daily. 180 tablet 3   losartan (COZAAR) 25 MG tablet Take 1 tablet (25 mg total) by mouth daily. 90 tablet 3   magnesium oxide (MAG-OX) 400 MG tablet Take 800 mg by mouth every morning.     metoprolol (TOPROL XL) 200 MG 24 hr tablet Take 1 tablet (200 mg total) by mouth daily. 90 tablet 3   nitroGLYCERIN (NITROSTAT) 0.4 MG SL tablet Place 1 tablet (0.4 mg total) under the tongue every 5 (five) minutes as needed for chest pain. 100 tablet 2   oxyCODONE-acetaminophen (PERCOCET) 10-325 MG tablet Take 1 tablet by mouth every 4 (four) hours as needed for pain.     pantoprazole (PROTONIX) 40 MG tablet Take 40 mg by mouth every morning.     Polyethylene Glycol 400 (BLINK TEARS) 0.25 % SOLN Place 1 drop into both eyes daily as needed (dry eyes).     potassium chloride (KLOR-CON M) 10 MEQ tablet Take 1 tablet (10 mEq total) by mouth daily. 90 tablet 3   prasugrel (EFFIENT) 10 MG TABS tablet Take 1 tablet (10 mg total) by mouth every morning.  30 tablet 0   promethazine (PHENERGAN) 25 MG tablet Take 25 mg by mouth 2 (two) times daily as needed for nausea or vomiting (Headache).     ranolazine (RANEXA) 500 MG 12 hr tablet Take 1 tablet (500 mg total) by mouth 2 (two) times daily. 180 tablet 3   spironolactone (ALDACTONE) 25 MG tablet Take 25 mg by mouth every morning.     tiZANidine (ZANAFLEX) 4 MG tablet Take 4-8 mg by mouth See admin instructions. Take one tablet (4 mg) by mouth daily with supper, take two tablets (8 mg) daily at bedtime, may also take one tablet (4 mg) in the morning     traZODone (DESYREL) 50 MG tablet Take 50 mg by mouth at bedtime.     TRESIBA FLEXTOUCH 100 UNIT/ML FlexTouch Pen Inject 36 Units into the skin daily.     No current facility-administered medications for this visit.    Allergies  Allergen Reactions   Metoclopramide Other (See Comments)    DYSKINESIAS contractions of whole body   Penicillins Swelling and Rash    Did it involve swelling of the face/tongue/throat, SOB, or low BP? Yes Did it involve sudden or severe rash/hives, skin peeling, or any reaction on the inside of your mouth or nose? Unknown Did you need to seek medical attention at a hospital or doctor's office? Yes When did it last happen?More than 40 years ago    If all above answers are "NO", may proceed with cephalosporin use.     Social History   Socioeconomic History   Marital status: Married    Spouse name: Not on file   Number of children: 2   Years of education: Not on file   Highest education level: Not on file  Occupational History   Not on file  Tobacco Use   Smoking status: Never   Smokeless tobacco: Former    Types: Chew   Tobacco comments:    per pt chewed tobacco in high school   Vaping Use   Vaping status: Never Used  Substance and Sexual Activity   Alcohol use: Never   Drug use: Never   Sexual activity: Not on file    Comment: MARRIED  Other Topics Concern   Not on file  Social History Narrative    Not on file   Social Determinants of Health   Financial Resource Strain: Not on file  Food Insecurity: Not on file  Transportation Needs: Not on file  Physical Activity: Not on file  Stress: Not on file  Social Connections: Not on file  Intimate Partner Violence: Not on file    Family History  Problem Relation Age of Onset   High blood pressure Mother    Stroke Mother    Diabetes Father    Heart disease Other     Review of Systems:  As stated in the HPI and otherwise negative.   BP 132/70   Pulse (!) 52   Ht 5\' 7"  (1.702 m)   Wt 64.7 kg   SpO2 98%   BMI 22.33 kg/m   Physical Examination: General: Well developed, well nourished, NAD  HEENT: OP clear, mucus membranes moist  SKIN: warm, dry. No rashes. Neuro: No focal deficits  Musculoskeletal: Muscle strength 5/5 all ext  Psychiatric: Mood and affect normal  Neck: No JVD, no carotid bruits, no thyromegaly, no lymphadenopathy.  Lungs:Clear bilaterally, no wheezes, rhonci, crackles Cardiovascular: Regular rate and rhythm. No murmurs, gallops or rubs. Abdomen:Soft. Bowel sounds present. Non-tender.  Extremities: No lower extremity edema. Pulses are 2 + in the bilateral DP/PT.  EKG:  EKG is ordered today. The ekg ordered today demonstrates  EKG Interpretation Date/Time:  Monday July 30 2022 16:27:00 EDT Ventricular Rate:  52 PR Interval:  116 QRS Duration:  90 QT Interval:  436 QTC Calculation: 405 R Axis:   62  Text Interpretation: Sinus bradycardia ST & T wave abnormality, consider lateral ischemia When compared with ECG of 03-Jun-2021 06:24, Nonspecific T wave abnormality, improved in Inferior leads Inverted T waves have replaced nonspecific T wave abnormality in Lateral leads Confirmed by Verne Carrow [phone]) on 07/30/2022 4:30:21 PM    Recent Labs: 04/05/2022: ALT 12; BUN 22; Creatinine, Ser 1.31; Hemoglobin 14.0; Platelets 172; Potassium 4.8; Sodium 138   Lipid Panel    Component Value Date/Time    CHOL 141 04/05/2022 1158   TRIG 81 04/05/2022 1158   HDL 64 04/05/2022 1158   CHOLHDL 2.2 04/05/2022 1158   CHOLHDL 2.4 02/02/2021 0358   VLDL 14 02/02/2021 0358   LDLCALC 61 04/05/2022 1158    Wt Readings from Last 3 Encounters:  07/30/22 64.7 kg  04/13/22 64.4 kg  04/05/22 65.4 kg    Assessment and Plan:   1. CAD s/p CABG with unstable angina: He is having unstable angina. Cardiac catheterization is indicated. I am not sure that we will have any good options for treatment given the multiple layers of stents in the LAD but we can explore treatment options once his coronary anatomy is visualized. He has had prior Shockwave lithotripsy of the LAD in 2022 but Dr. Katrinka Blazing was unable to fully expand the stent.   I have reviewed the risks, indications, and alternatives to cardiac catheterization, possible angioplasty, and stenting with the patient. Risks include but are not limited to bleeding, infection, vascular injury, stroke, myocardial infection, arrhythmia, kidney injury, radiation-related injury in the case of prolonged fluoroscopy use, emergency cardiac surgery, and death. The patient understands the risks of serious complication is 1-2 in 1000 with diagnostic cardiac cath and 1-2% or less with angioplasty/stenting. BMET and CBC today.    Labs/ tests ordered today include:   Orders Placed This Encounter  Procedures   EKG  12-Lead   Disposition:   F/U with Dr. Lynnette Caffey or office APP in 2-4 weeks.    Signed, Verne Carrow, MD, [facility] 07/30/2022 5:01 PM    [facility] [address] [place], [place], [place]  [address] Phone: [phone]; Fax: [phone]

## 2022-07-31 ENCOUNTER — Encounter (HOSPITAL_COMMUNITY): Payer: Self-pay | Admitting: Cardiovascular Disease

## 2022-07-31 ENCOUNTER — Ambulatory Visit (HOSPITAL_COMMUNITY)
Admission: RE | Disposition: A | Discharge status: Home / Self Care | Payer: Self-pay | Source: Home / Self Care | Attending: Cardiovascular Disease

## 2022-07-31 ENCOUNTER — Other Ambulatory Visit: Payer: Self-pay

## 2022-07-31 ENCOUNTER — Observation Stay (HOSPITAL_COMMUNITY)
Admission: RE | Admit: 2022-07-31 | Discharge: 2022-08-01 | Disposition: A | Discharge status: Home / Self Care | Payer: Managed Care, Other (non HMO) | Attending: Cardiovascular Disease | Admitting: Cardiovascular Disease

## 2022-07-31 DIAGNOSIS — N183 Chronic kidney disease, stage 3 unspecified: Secondary | ICD-10-CM | POA: Diagnosis not present

## 2022-07-31 DIAGNOSIS — I10 Essential (primary) hypertension: Secondary | ICD-10-CM | POA: Diagnosis present

## 2022-07-31 DIAGNOSIS — Z87891 Personal history of nicotine dependence: Secondary | ICD-10-CM | POA: Insufficient documentation

## 2022-07-31 DIAGNOSIS — I480 Paroxysmal atrial fibrillation: Secondary | ICD-10-CM | POA: Diagnosis not present

## 2022-07-31 DIAGNOSIS — Z7982 Long term (current) use of aspirin: Secondary | ICD-10-CM | POA: Insufficient documentation

## 2022-07-31 DIAGNOSIS — E1122 Type 2 diabetes mellitus with diabetic chronic kidney disease: Secondary | ICD-10-CM | POA: Insufficient documentation

## 2022-07-31 DIAGNOSIS — I25810 Atherosclerosis of coronary artery bypass graft(s) without angina pectoris: Secondary | ICD-10-CM | POA: Diagnosis present

## 2022-07-31 DIAGNOSIS — I130 Hypertensive heart and chronic kidney disease with heart failure and stage 1 through stage 4 chronic kidney disease, or unspecified chronic kidney disease: Secondary | ICD-10-CM | POA: Insufficient documentation

## 2022-07-31 DIAGNOSIS — Z9861 Coronary angioplasty status: Secondary | ICD-10-CM

## 2022-07-31 DIAGNOSIS — Z951 Presence of aortocoronary bypass graft: Secondary | ICD-10-CM | POA: Insufficient documentation

## 2022-07-31 DIAGNOSIS — Z794 Long term (current) use of insulin: Secondary | ICD-10-CM | POA: Diagnosis not present

## 2022-07-31 DIAGNOSIS — Z79899 Other long term (current) drug therapy: Secondary | ICD-10-CM | POA: Diagnosis not present

## 2022-07-31 DIAGNOSIS — I5032 Chronic diastolic (congestive) heart failure: Secondary | ICD-10-CM | POA: Diagnosis not present

## 2022-07-31 DIAGNOSIS — Z01812 Encounter for preprocedural laboratory examination: Secondary | ICD-10-CM

## 2022-07-31 DIAGNOSIS — E785 Hyperlipidemia, unspecified: Secondary | ICD-10-CM | POA: Diagnosis present

## 2022-07-31 DIAGNOSIS — I2510 Atherosclerotic heart disease of native coronary artery without angina pectoris: Secondary | ICD-10-CM | POA: Diagnosis not present

## 2022-07-31 DIAGNOSIS — I25110 Atherosclerotic heart disease of native coronary artery with unstable angina pectoris: Principal | ICD-10-CM | POA: Insufficient documentation

## 2022-07-31 DIAGNOSIS — I209 Angina pectoris, unspecified: Secondary | ICD-10-CM | POA: Diagnosis present

## 2022-07-31 DIAGNOSIS — I200 Unstable angina: Secondary | ICD-10-CM | POA: Diagnosis present

## 2022-07-31 HISTORY — PX: LEFT HEART CATH AND CORS/GRAFTS ANGIOGRAPHY: CATH118250

## 2022-07-31 HISTORY — PX: CORONARY BALLOON ANGIOPLASTY: CATH118233

## 2022-07-31 LAB — BASIC METABOLIC PANEL
Anion gap: 7 (ref 5–15)
BUN: 21 mg/dL (ref 8–23)
CO2: 26 mmol/L (ref 22–32)
Calcium: 9.1 mg/dL (ref 8.9–10.3)
Chloride: 104 mmol/L (ref 98–111)
Creatinine, Ser: 1.18 mg/dL (ref 0.61–1.24)
GFR, Estimated: >60 mL/min (ref >60)
Glucose, Bld: 127 mg/dL — ABNORMAL HIGH (ref 70–99)
Potassium: 4.1 mmol/L (ref 3.5–5.1)
Sodium: 137 mmol/L (ref 135–145)

## 2022-07-31 LAB — CBC
HCT: 40.3 % (ref 39.0–52.0)
Hemoglobin: 13.5 g/dL (ref 13.0–17.0)
MCH: 32.6 pg (ref 26.0–34.0)
MCHC: 33.5 g/dL (ref 30.0–36.0)
MCV: 97.3 fL (ref 80.0–100.0)
Platelets: 143 10*3/uL — ABNORMAL LOW (ref 150–400)
RBC: 4.14 MIL/uL — ABNORMAL LOW (ref 4.22–5.81)
RDW: 13.5 % (ref 11.5–15.5)
WBC: 6.9 10*3/uL (ref 4.0–10.5)
nRBC: 0 % (ref 0.0–0.2)

## 2022-07-31 LAB — GLUCOSE, CAPILLARY
Glucose-Capillary: 102 mg/dL — ABNORMAL HIGH (ref 70–99)
Glucose-Capillary: 82 mg/dL (ref 70–99)
Glucose-Capillary: 173 mg/dL — ABNORMAL HIGH (ref 70–99)

## 2022-07-31 LAB — POCT ACTIVATED CLOTTING TIME
Activated Clotting Time: 250 seconds
Activated Clotting Time: 360 seconds
Activated Clotting Time: 207 seconds
Activated Clotting Time: 183 seconds

## 2022-07-31 SURGERY — LEFT HEART CATH AND CORS/GRAFTS ANGIOGRAPHY
Anesthesia: LOCAL

## 2022-07-31 MED ORDER — SODIUM CHLORIDE 0.9 % WEIGHT BASED INFUSION: 1.0000 mL/kg/h | INTRAVENOUS | Status: DC

## 2022-07-31 MED ORDER — SPIRONOLACTONE 25 MG PO TABS
25.0000 mg | ORAL_TABLET | Freq: Every morning | ORAL | Status: DC
  Administered 2022-08-01: 25 mg via ORAL
  Filled 2022-07-31: qty 1

## 2022-07-31 MED ORDER — ASPIRIN 81 MG PO TBEC
81.0000 mg | DELAYED_RELEASE_TABLET | Freq: Every day | ORAL | Status: DC
  Administered 2022-08-01: 81 mg via ORAL
  Filled 2022-07-31: qty 1

## 2022-07-31 MED ORDER — TRAZODONE HCL 50 MG PO TABS
50.0000 mg | ORAL_TABLET | Freq: Every day | ORAL | Status: DC
  Administered 2022-07-31: 50 mg via ORAL
  Filled 2022-07-31: qty 1

## 2022-07-31 MED ORDER — SODIUM CHLORIDE 0.9 % IV SOLN: INTRAVENOUS | Status: AC

## 2022-07-31 MED ORDER — MIDAZOLAM HCL 2 MG/2ML IJ SOLN
INTRAMUSCULAR | Status: AC
  Filled 2022-07-31: qty 2

## 2022-07-31 MED ORDER — FUROSEMIDE 20 MG PO TABS
20.0000 mg | ORAL_TABLET | Freq: Every day | ORAL | Status: DC
  Administered 2022-08-01: 20 mg via ORAL
  Filled 2022-07-31: qty 1

## 2022-07-31 MED ORDER — DAPAGLIFLOZIN PROPANEDIOL 10 MG PO TABS
10.0000 mg | ORAL_TABLET | Freq: Every day | ORAL | Status: DC
  Administered 2022-08-01: 10 mg via ORAL
  Filled 2022-07-31: qty 1

## 2022-07-31 MED ORDER — ACETAMINOPHEN 325 MG PO TABS
650.0000 mg | ORAL_TABLET | ORAL | Status: DC | PRN
  Filled 2022-07-31: qty 2

## 2022-07-31 MED ORDER — GABAPENTIN 300 MG PO CAPS
1500.0000 mg | ORAL_CAPSULE | Freq: Two times a day (BID) | ORAL | Status: DC
  Administered 2022-07-31 – 2022-08-01 (×2): 1500 mg via ORAL
  Filled 2022-07-31 (×2): qty 5

## 2022-07-31 MED ORDER — SODIUM CHLORIDE 0.9% FLUSH
3.0000 mL | Freq: Two times a day (BID) | INTRAVENOUS | Status: DC
  Administered 2022-07-31 – 2022-08-01 (×2): 3 mL via INTRAVENOUS

## 2022-07-31 MED ORDER — NITROGLYCERIN 0.4 MG SL SUBL: 0.4000 mg | SUBLINGUAL_TABLET | SUBLINGUAL | Status: DC | PRN

## 2022-07-31 MED ORDER — ATORVASTATIN CALCIUM 80 MG PO TABS
80.0000 mg | ORAL_TABLET | Freq: Every day | ORAL | Status: DC
  Administered 2022-07-31: 80 mg via ORAL
  Filled 2022-07-31: qty 1

## 2022-07-31 MED ORDER — TIZANIDINE HCL 4 MG PO TABS
4.0000 mg | ORAL_TABLET | Freq: Two times a day (BID) | ORAL | Status: DC
  Administered 2022-07-31: 4 mg via ORAL
  Filled 2022-07-31 (×3): qty 1

## 2022-07-31 MED ORDER — HEPARIN SODIUM (PORCINE) 1000 UNIT/ML IJ SOLN
INTRAMUSCULAR | Status: DC | PRN
  Administered 2022-07-31: 6000 [IU] via INTRAVENOUS

## 2022-07-31 MED ORDER — VERAPAMIL HCL 2.5 MG/ML IV SOLN
INTRAVENOUS | Status: AC
  Filled 2022-07-31: qty 2

## 2022-07-31 MED ORDER — HEPARIN SODIUM (PORCINE) 1000 UNIT/ML IJ SOLN
INTRAMUSCULAR | Status: AC
  Filled 2022-07-31: qty 10

## 2022-07-31 MED ORDER — LABETALOL HCL 5 MG/ML IV SOLN: 10.0000 mg | INTRAVENOUS | Status: AC | PRN

## 2022-07-31 MED ORDER — ASPIRIN 81 MG PO CHEW: 81.0000 mg | CHEWABLE_TABLET | ORAL | Status: DC

## 2022-07-31 MED ORDER — ONDANSETRON HCL 4 MG/2ML IJ SOLN: 4.0000 mg | Freq: Four times a day (QID) | INTRAMUSCULAR | Status: DC | PRN

## 2022-07-31 MED ORDER — OXYCODONE-ACETAMINOPHEN 10-325 MG PO TABS: 1.0000 | ORAL_TABLET | ORAL | Status: DC | PRN

## 2022-07-31 MED ORDER — LIDOCAINE HCL (PF) 1 % IJ SOLN
INTRAMUSCULAR | Status: AC
  Filled 2022-07-31: qty 30

## 2022-07-31 MED ORDER — TIZANIDINE HCL 4 MG PO TABS: 4.0000 mg | ORAL_TABLET | Freq: Three times a day (TID) | ORAL | Status: DC

## 2022-07-31 MED ORDER — FENTANYL CITRATE (PF) 100 MCG/2ML IJ SOLN
INTRAMUSCULAR | Status: AC
  Filled 2022-07-31: qty 2

## 2022-07-31 MED ORDER — OXYCODONE HCL 5 MG PO TABS
5.0000 mg | ORAL_TABLET | ORAL | Status: DC | PRN
  Administered 2022-07-31: 5 mg via ORAL
  Filled 2022-07-31: qty 1

## 2022-07-31 MED ORDER — PANTOPRAZOLE SODIUM 40 MG PO TBEC
40.0000 mg | DELAYED_RELEASE_TABLET | Freq: Every morning | ORAL | Status: DC
  Administered 2022-08-01: 40 mg via ORAL
  Filled 2022-07-31: qty 1

## 2022-07-31 MED ORDER — FENTANYL CITRATE (PF) 100 MCG/2ML IJ SOLN
INTRAMUSCULAR | Status: DC | PRN
  Administered 2022-07-31 (×2): 50 ug via INTRAVENOUS

## 2022-07-31 MED ORDER — LOSARTAN POTASSIUM 50 MG PO TABS
25.0000 mg | ORAL_TABLET | Freq: Every day | ORAL | Status: DC
  Administered 2022-08-01: 25 mg via ORAL
  Filled 2022-07-31: qty 1

## 2022-07-31 MED ORDER — METOPROLOL SUCCINATE ER 25 MG PO TB24
200.0000 mg | ORAL_TABLET | Freq: Every day | ORAL | Status: DC
  Administered 2022-08-01: 200 mg via ORAL
  Filled 2022-07-31: qty 8

## 2022-07-31 MED ORDER — RANOLAZINE ER 500 MG PO TB12
500.0000 mg | ORAL_TABLET | Freq: Two times a day (BID) | ORAL | Status: DC
  Administered 2022-07-31 – 2022-08-01 (×2): 500 mg via ORAL
  Filled 2022-07-31 (×2): qty 1

## 2022-07-31 MED ORDER — TIZANIDINE HCL 4 MG PO TABS
8.0000 mg | ORAL_TABLET | Freq: Every day | ORAL | Status: DC
  Administered 2022-07-31: 8 mg via ORAL
  Filled 2022-07-31 (×2): qty 2

## 2022-07-31 MED ORDER — IOHEXOL 350 MG/ML SOLN
INTRAVENOUS | Status: DC | PRN
  Administered 2022-07-31: 84 mL

## 2022-07-31 MED ORDER — HEPARIN (PORCINE) IN NACL 1000-0.9 UT/500ML-% IV SOLN
INTRAVENOUS | Status: DC | PRN
  Administered 2022-07-31 (×2): 500 mL

## 2022-07-31 MED ORDER — LIDOCAINE HCL (PF) 1 % IJ SOLN
INTRAMUSCULAR | Status: DC | PRN
  Administered 2022-07-31: 10 mL via INTRADERMAL

## 2022-07-31 MED ORDER — HYDRALAZINE HCL 20 MG/ML IJ SOLN: 10.0000 mg | INTRAMUSCULAR | Status: AC | PRN

## 2022-07-31 MED ORDER — PRASUGREL HCL 10 MG PO TABS
10.0000 mg | ORAL_TABLET | Freq: Every morning | ORAL | Status: DC
  Administered 2022-08-01: 10 mg via ORAL
  Filled 2022-07-31: qty 1

## 2022-07-31 MED ORDER — SODIUM CHLORIDE 0.9 % WEIGHT BASED INFUSION
3.0000 mL/kg/h | INTRAVENOUS | Status: DC
  Administered 2022-07-31: 3 mL/kg/h via INTRAVENOUS

## 2022-07-31 MED ORDER — ISOSORBIDE MONONITRATE ER 60 MG PO TB24
120.0000 mg | ORAL_TABLET | Freq: Every day | ORAL | Status: DC
  Administered 2022-08-01: 120 mg via ORAL
  Filled 2022-07-31: qty 2

## 2022-07-31 MED ORDER — SODIUM CHLORIDE 0.9% FLUSH: 3.0000 mL | INTRAVENOUS | Status: DC | PRN

## 2022-07-31 MED ORDER — MIDAZOLAM HCL 2 MG/2ML IJ SOLN
INTRAMUSCULAR | Status: DC | PRN
  Administered 2022-07-31: 2 mg via INTRAVENOUS

## 2022-07-31 MED ORDER — SODIUM CHLORIDE 0.9 % IV SOLN: 250.0000 mL | INTRAVENOUS | Status: DC | PRN

## 2022-07-31 MED ORDER — OXYCODONE-ACETAMINOPHEN 5-325 MG PO TABS
1.0000 | ORAL_TABLET | ORAL | Status: DC | PRN
  Administered 2022-07-31: 1 via ORAL
  Filled 2022-07-31: qty 1

## 2022-07-31 SURGICAL SUPPLY — 18 items
BALLN SCOREFLEX 2.75X15 (BALLOONS) ×1
BALLN ~~LOC~~ EMERGE MR 2.0X15 (BALLOONS) ×1
BALLN ~~LOC~~ EMERGE MR 2.5X20 (BALLOONS) ×1
BALLN ~~LOC~~ EMERGE MR 3.0X20 (BALLOONS) ×1
BALLOON SCOREFLEX 2.75X15 (BALLOONS) IMPLANT
BALLOON ~~LOC~~ EMERGE MR 2.0X15 (BALLOONS) IMPLANT
BALLOON ~~LOC~~ EMERGE MR 2.5X20 (BALLOONS) IMPLANT
BALLOON ~~LOC~~ EMERGE MR 3.0X20 (BALLOONS) IMPLANT
CATH INFINITI 5FR MULTPACK ANG (CATHETERS) IMPLANT
CATH VISTA GUIDE 6FR XBLAD3.5 (CATHETERS) IMPLANT
KIT ENCORE 26 ADVANTAGE (KITS) IMPLANT
KIT MICROPUNCTURE NIT STIFF (SHEATH) IMPLANT
PACK CARDIAC CATHETERIZATION (CUSTOM PROCEDURE TRAY) ×2 IMPLANT
SET ATX-X65L (MISCELLANEOUS) IMPLANT
SHEATH PINNACLE 5F 10CM (SHEATH) IMPLANT
SHEATH PINNACLE 6F 10CM (SHEATH) IMPLANT
WIRE ASAHI PROWATER 180CM (WIRE) IMPLANT
WIRE EMERALD 3MM-J .035X150CM (WIRE) IMPLANT

## 2022-07-31 NOTE — Plan of Care (Signed)

## 2022-07-31 NOTE — Progress Notes (Signed)
Pre sheath removal-  6 french sheath in right femoral artery Site oozing but sheath was intact Right DP doppler Bp- 144/78 Hr 61  Sheath removal manual pressure by Charlene RT-R/  Sherelle RCIS for 20 minutes VS reamin stable no changes  Right DP doppler q55min  Post removal   Site level 0  Gauze/tape applied Vitals remain stable Right DP doppler Post instructions given and confirmed verbally by patient

## 2022-07-31 NOTE — Interval H&P Note (Signed)
History and Physical Interval Note:  07/31/2022 8:57 AM  Terry Nelson  has presented today for surgery, with the diagnosis of unstable angina - chest pain.  The various methods of treatment have been discussed with the patient and family. After consideration of risks, benefits and other options for treatment, the patient has consented to  Procedure(s): LEFT HEART CATH AND CORS/GRAFTS ANGIOGRAPHY (N/A) as a surgical intervention.  The patient's history has been reviewed, patient examined, no change in status, stable for surgery.  I have reviewed the patient's chart and labs.  Questions were answered to the patient's satisfaction.    Cath Lab Visit (complete for each Cath Lab visit)  Clinical Evaluation Leading to the Procedure:   ACS: No.  Non-ACS:    Anginal Classification: CCS III  Anti-ischemic medical therapy: Maximal Therapy (2 or more classes of medications)  Non-Invasive Test Results: No non-invasive testing performed  Prior CABG: Previous CABG        Verne Carrow

## 2022-08-01 ENCOUNTER — Encounter (HOSPITAL_COMMUNITY): Payer: Self-pay | Admitting: Cardiovascular Disease

## 2022-08-01 DIAGNOSIS — I10 Essential (primary) hypertension: Secondary | ICD-10-CM

## 2022-08-01 DIAGNOSIS — I2583 Coronary atherosclerosis due to lipid rich plaque: Secondary | ICD-10-CM

## 2022-08-01 DIAGNOSIS — E7800 Pure hypercholesterolemia, unspecified: Secondary | ICD-10-CM

## 2022-08-01 DIAGNOSIS — I25110 Atherosclerotic heart disease of native coronary artery with unstable angina pectoris: Secondary | ICD-10-CM | POA: Diagnosis not present

## 2022-08-01 LAB — CBC
HCT: 38.4 % — ABNORMAL LOW (ref 39.0–52.0)
Hemoglobin: 12.8 g/dL — ABNORMAL LOW (ref 13.0–17.0)
MCH: 32.7 pg (ref 26.0–34.0)
MCHC: 33.3 g/dL (ref 30.0–36.0)
MCV: 98.2 fL (ref 80.0–100.0)
Platelets: 145 10*3/uL — ABNORMAL LOW (ref 150–400)
RBC: 3.91 MIL/uL — ABNORMAL LOW (ref 4.22–5.81)
RDW: 13.5 % (ref 11.5–15.5)
WBC: 7.2 10*3/uL (ref 4.0–10.5)
nRBC: 0 % (ref 0.0–0.2)

## 2022-08-01 LAB — BASIC METABOLIC PANEL
Anion gap: 8 (ref 5–15)
BUN: 16 mg/dL (ref 8–23)
CO2: 27 mmol/L (ref 22–32)
Calcium: 9.2 mg/dL (ref 8.9–10.3)
Chloride: 104 mmol/L (ref 98–111)
Creatinine, Ser: 1.03 mg/dL (ref 0.61–1.24)
GFR, Estimated: >60 mL/min (ref >60)
Glucose, Bld: 122 mg/dL — ABNORMAL HIGH (ref 70–99)
Potassium: 3.9 mmol/L (ref 3.5–5.1)
Sodium: 139 mmol/L (ref 135–145)

## 2022-08-01 LAB — GLUCOSE, CAPILLARY
Glucose-Capillary: 122 mg/dL — ABNORMAL HIGH (ref 70–99)
Glucose-Capillary: 153 mg/dL — ABNORMAL HIGH (ref 70–99)

## 2022-08-01 MED ORDER — NITROGLYCERIN 0.4 MG SL SUBL: 0.4000 mg | SUBLINGUAL_TABLET | SUBLINGUAL | 1 refills | Status: DC | PRN

## 2022-08-01 MED FILL — Verapamil HCl IV Soln 2.5 MG/ML: INTRAVENOUS | Qty: 2 | Status: AC

## 2022-08-01 NOTE — Discharge Summary (Signed)
Discharge Summary    Patient ID: Terry Nelson MRN: [id number]; DOB: November 30, 1955  Admit date: 07/31/2022 Discharge date: 08/01/2022  PCP:  Jiles Harold, PA-C   [facility] Providers Cardiologist:  [name], MD      Discharge Diagnoses    Principal Problem:   Unstable angina [facility]) Active Problems:   CAD (coronary artery disease) of artery bypass graft   Angina pectoris (HCC)   Hyperlipidemia LDL goal <70   Hypertension   PAF (paroxysmal atrial fibrillation) [facility])    Diagnostic Studies/Procedures    Left Heart Catheterization 07/31/22   Ost LAD to Prox LAD lesion is 40% stenosed.   Prox LAD to Mid LAD lesion is 40% stenosed with 40% stenosed side branch in 2nd Diag.   Dist RCA lesion is 20% stenosed.   1st Mrg lesion is 90% stenosed.   2nd Mrg lesion is 25% stenosed.   RPDA lesion is 20% stenosed.   Mid RCA to Dist RCA lesion is 60% stenosed.   Dist LAD-3 lesion is 90% stenosed.   Dist LAD-2 lesion is 99% stenosed.   Dist LAD-1 lesion is 99% stenosed.   Scoring balloon angioplasty was performed using a BALLN SCOREFLEX 2.75X15.   Post intervention, there is a 50% residual stenosis.   Post intervention, there is a 50% residual stenosis.   LIMA graft was not injected.   SVG graft was visualized by angiography and is normal in caliber.   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  Moderate ostial/proximal LAD stenosis that does not appear to be flow limiting.  Multiple layers of stent in the proximal to distal LAD with severe mid to distal stented segment restenosis. Beyond the distal edge of the LAD stent the apical LAD becomes very small and is diffusely diseased. LIMA to mid LAD is known to be atretic and was not visualized.  Successful PTCA with scoring balloon angioplasty of the mid and distal LAD stented segment.  The Circumflex is patent. Severe stenosis ostium of the first obtuse marginal branch. Patent sequential SVG to OM1 and  OM2.  Native RCA with mild diffuse proximal and mid vessel stenosis, moderate distal vessel stenosis and patent distal RCA stent into the small caliber PDA Normal LV filling pressure   Recommendations: Continue current medical therapy. Monitor overnight post groin sheath pull and PCI. He has no good options for more definitive treatment of his recurrent stent restenosis in the entire LAD. There are multiple layers of stents and these are under expanded in several areas. Prior attempts at Shockwave lithotripsy in 2021 not successful.   Diagnostic Dominance: Right  Intervention   _____________   History of Present Illness     Terry Nelson is a 67 y.o. male with past medical history of CAD s/p CABG x 3, CKD, chronic diastolic heart failure, diabetes, GERD, hypertension, hyperlipidemia, paroxysmal atrial fibrillation.  Patient is currently followed by Dr. Lynnette Caffey, and was previously followed by Dr. Katrinka Blazing.  He has a known history of CAD with prior three-vessel CABG in 2002.  He has undergone multiple stenting procedures to the LAD and has multiple layers of stents in the LAD.  Patient also has prior stenting of the distal RCA/PDA.  Last cardiac catheterization in 05/2021 revealed patent LAD but high-grade stenosis in the stented segment of the distal LAD.  This was treated with balloon cutting angioplasty with suboptimal result (stenosis taken from 95% down to 75%).  The LIMA to the LAD  is atretic.  The SVG both OM branches is patent.  Moderate distal RCA stenosis with patent distal RCA stent stent into the PDA.  Echo in 01/2021 with normal LV systolic function and mild MR.  Patient was seen by Dr. Clifton James in the clinic on 7/22.  At that appointment he reported me having severe daily chest pain for the past few weeks.  Pain was associated with dyspnea.  There was concern he was having unstable angina and he was set up for an outpatient cardiac catheterization.  Hospital Course     Consultants:  none  Patient presented for cardiac catheterization on 7/23 with a diagnosis of unstable angina, chest pain.  He was taken to the Cath Lab with Dr. Clifton James and was found to have severe three-vessel CAD s/p 3V CABG with 2/3 patent grafts.  There was moderate ostial/proximal LAD stenosis that did not appear to be flow-limiting.  Multiple layers of stent in the proximal to distal LAD with severe mid-distal stented segment restenosis.  Beyond the distal edge of the LAD stent, the apical LAD becomes very small and is diffusely diseased.  LIMA to mid LAD is known to be atretic and was not visualized.  Patient underwent successful PTCA with scoring balloon angioplasty of the mid and distal LAD stented segment.  The circumflex is patent and there is severe stenosis in the ostium of the first obtuse marginal branch.  Patent SVG-OM1 and OM 2.  Native RCA with mild diffuse proximal and mid vessel stenosis, moderate distal vessel stenosis and patent distal RCA stent into the small caliber PDA. It was recommended that patient continue his current medical therapy. He was admitted overnight post groin sheath pull and PCI. Of note, there are no good options for more definitive treatment of his recurrent stent restenosis in the entire LAD as there are multiple layers of stents and they are under expanded in several areas.   CAD s/p CAGB Unstable angina  - Patient presented for outpatient cardiac catheterization on 7/23 as above- underwent successful PTCA with scoring balloon angioplasty of the mid and distal LAD stented segment  - He was admitted overnight after groin sheath pull and PCI  - Right groin was soft, mildly tender to palpation on 7/24. No bruising or bleeding noted. Discussed site care and activity restrictions. Provided written instructions on AVS - Patient walked in the halls with cardiac rehab prior to DC and tolerated ambulation well  - Continue ASA, effient  - Continue lipitor 80 mg daily  - Continue  metoprolol succinate 200 mg daily, ranexa 500 mg BID, imdur 120 mg daily   HTN  - BP well controlled on imdur 120 mg daily, losartan 25 mg daily, metoprolol succinate 200 mg daily, spironolactone 25 mg daily   HLD  - Lipid panel from 03/2022 showed LDL 61, HDL 64, triglycerides 81 mg daily, total cholesterol 141 - Continue lipitor 80 mg daily   Type 2 DM  - A1c was 6.2 in 03/2022  - Continue home regiment. Managed by PCP   CKD stage II - Creatinine stable at 1.03 after cardiac catheterization   PAF - Quiescent for several years. No longer on AC due to need for DAPT   Did the patient have an acute coronary syndrome (MI, NSTEMI, STEMI, etc) this admission?:  No                               Did the  patient have a percutaneous coronary intervention (stent / angioplasty)?:  Yes.     Cath/PCI Registry Performance & Quality Measures: Aspirin prescribed? - Yes ADP Receptor Inhibitor (Plavix/Clopidogrel, Brilinta/Ticagrelor or Effient/Prasugrel) prescribed (includes medically managed patients)? - Yes High Intensity Statin (Lipitor 40-80mg  or Crestor 20-40mg ) prescribed? - Yes For EF <40%, was ACEI/ARB prescribed? - Not Applicable (EF >/= 40%) For EF <40%, Aldosterone Antagonist (Spironolactone or Eplerenone) prescribed? - Not Applicable (EF >/= 40%) Cardiac Rehab Phase II ordered? - Yes         _____________  Discharge Vitals Blood pressure 125/70, pulse 68, temperature 98.1 F (36.7 C), temperature source Oral, resp. rate 18, height 5\' 7"  (1.702 m), weight 64.5 kg, SpO2 95%.  Filed Weights   07/31/22 0709  Weight: 64.5 kg    Labs & Radiologic Studies    CBC Recent Labs    07/31/22 0734 08/01/22 0414  WBC 6.9 7.2  HGB 13.5 12.8*  HCT 40.3 38.4*  MCV 97.3 98.2  PLT 143* 145*   Basic Metabolic Panel Recent Labs    96/04/54 0734 08/01/22 0414  NA 137 139  K 4.1 3.9  CL 104 104  CO2 26 27  GLUCOSE 127* 122*  BUN 21 16  CREATININE 1.18 1.03  CALCIUM 9.1 9.2    Liver Function Tests No results for input(s): "AST", "ALT", "ALKPHOS", "BILITOT", "PROT", "ALBUMIN" in the last 72 hours. No results for input(s): "LIPASE", "AMYLASE" in the last 72 hours. High Sensitivity Troponin:   No results for input(s): "TROPONINIHS" in the last 720 hours.  BNP Invalid input(s): "POCBNP" D-Dimer No results for input(s): "DDIMER" in the last 72 hours. Hemoglobin A1C No results for input(s): "HGBA1C" in the last 72 hours. Fasting Lipid Panel No results for input(s): "CHOL", "HDL", "LDLCALC", "TRIG", "CHOLHDL", "LDLDIRECT" in the last 72 hours. Thyroid Function Tests No results for input(s): "TSH", "T4TOTAL", "T3FREE", "THYROIDAB" in the last 72 hours.  Invalid input(s): "FREET3" _____________  CARDIAC CATHETERIZATION  Result Date: 07/31/2022   Ost LAD to Prox LAD lesion is 40% stenosed.   Prox LAD to Mid LAD lesion is 40% stenosed with 40% stenosed side branch in 2nd Diag.   Dist RCA lesion is 20% stenosed.   1st Mrg lesion is 90% stenosed.   2nd Mrg lesion is 25% stenosed.   RPDA lesion is 20% stenosed.   Mid RCA to Dist RCA lesion is 60% stenosed.   Dist LAD-3 lesion is 90% stenosed.   Dist LAD-2 lesion is 99% stenosed.   Dist LAD-1 lesion is 99% stenosed.   Scoring balloon angioplasty was performed using a BALLN SCOREFLEX 2.75X15.   Post intervention, there is a 50% residual stenosis.   Post intervention, there is a 50% residual stenosis.   LIMA graft was not injected.   SVG graft was visualized by angiography and is normal in caliber. 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 Moderate ostial/proximal LAD stenosis that does not appear to be flow limiting. Multiple layers of stent in the proximal to distal LAD with severe mid to distal stented segment restenosis. Beyond the distal edge of the LAD stent the apical LAD becomes very small and is diffusely diseased. LIMA to mid LAD is known to be atretic and was not visualized. Successful PTCA with scoring  balloon angioplasty of the mid and distal LAD stented segment. The Circumflex is patent. Severe stenosis ostium of the first obtuse marginal branch. Patent sequential SVG to OM1 and OM2. Native RCA with mild diffuse  proximal and mid vessel stenosis, moderate distal vessel stenosis and patent distal RCA stent into the small caliber PDA Normal LV filling pressure Recommendations: Continue current medical therapy. Monitor overnight post groin sheath pull and PCI. He has no good options for more definitive treatment of his recurrent stent restenosis in the entire LAD. There are multiple layers of stents and these are under expanded in several areas. Prior attempts at Shockwave lithotripsy in 2021 not successful.   Disposition   Pt is being discharged home today in good condition.  Follow-up Plans & Appointments     Follow-up Information     Ronney Asters, NP Follow up on 08/13/2022.   Specialty: Cardiology Why: Appointment at 2:45 PM Contact information: [address] [place] [address] [phone]                Discharge Instructions     Amb Referral to Cardiac Rehabilitation   Complete by: As directed    Diagnosis: PTCA   After initial evaluation and assessments completed: Virtual Based Care may be provided alone or in conjunction with Phase 2 Cardiac Rehab based on patient barriers.: Yes   Intensive Cardiac Rehabilitation (ICR) [facility] location only OR Traditional Cardiac Rehabilitation (TCR) *If criteria for ICR are not met will enroll in TCR [facility] only): Yes        Discharge Medications   Allergies as of 08/01/2022       Reactions   Metoclopramide Other (See Comments)   DYSKINESIAS contractions of whole body   Penicillins Swelling, Rash   Did it involve swelling of the face/tongue/throat, SOB, or low BP? Yes Did it involve sudden or severe rash/hives, skin peeling, or any reaction on the inside of your mouth or nose? Unknown Did you need to seek medical  attention at a hospital or doctor's office? Yes When did it last happen?More than 40 years ago    If all above answers are "NO", may proceed with cephalosporin use.        Medication List     TAKE these medications    aspirin EC 81 MG tablet Take 1 tablet (81 mg total) by mouth daily. Swallow whole.   atorvastatin 80 MG tablet Commonly known as: LIPITOR Take 80 mg by mouth at bedtime.   BD Pen Needle Nano U/F 32G X 4 MM Misc Generic drug: Insulin Pen Needle USE ONCE AS DIRECTED   Blink Tears 0.25 % Soln Generic drug: Polyethylene Glycol 400 Place 1 drop into both eyes daily as needed (dry eyes).   Contour Next Monitor w/Device Kit daily.   Contour Next Test test strip Generic drug: glucose blood by Does not apply route.   dapagliflozin propanediol 10 MG Tabs tablet Commonly known as: FARXIGA Take 1 tablet (10 mg total) by mouth daily.   furosemide 40 MG tablet Commonly known as: LASIX Take 40mg  (1 tablet) daily. Take additional 40mg  as needed for weight gain of 2 pounds overnight or 5 pounds in one week. What changed:  how much to take how to take this when to take this   gabapentin 300 MG capsule Commonly known as: NEURONTIN Take 900-1,500 mg by mouth See admin instructions. Takes 900 mg in the morning and 1500 in the evening   isosorbide mononitrate 60 MG 24 hr tablet Commonly known as: IMDUR Take 2 tablets (120 mg total) by mouth daily.   losartan 25 MG tablet Commonly known as: COZAAR Take 1 tablet (25 mg total) by mouth daily.  magnesium oxide 400 MG tablet Commonly known as: MAG-OX Take 800 mg by mouth every morning.   metoprolol 200 MG 24 hr tablet Commonly known as: Toprol XL Take 1 tablet (200 mg total) by mouth daily.   nitroGLYCERIN 0.4 MG SL tablet Commonly known as: NITROSTAT Place 1 tablet (0.4 mg total) under the tongue every 5 (five) minutes as needed for chest pain.   oxyCODONE-acetaminophen 10-325 MG tablet Commonly known as:  PERCOCET Take 1 tablet by mouth every 4 (four) hours as needed for pain.   pantoprazole 40 MG tablet Commonly known as: PROTONIX Take 40 mg by mouth every morning.   potassium chloride 10 MEQ tablet Commonly known as: KLOR-CON M Take 1 tablet (10 mEq total) by mouth daily.   prasugrel 10 MG Tabs tablet Commonly known as: EFFIENT Take 1 tablet (10 mg total) by mouth every morning.   promethazine 25 MG tablet Commonly known as: PHENERGAN Take 25 mg by mouth 2 (two) times daily as needed for nausea or vomiting (Headache).   ranolazine 500 MG 12 hr tablet Commonly known as: Ranexa Take 1 tablet (500 mg total) by mouth 2 (two) times daily.   spironolactone 25 MG tablet Commonly known as: ALDACTONE Take 25 mg by mouth every morning.   tiZANidine 4 MG tablet Commonly known as: ZANAFLEX Take 4-8 mg by mouth See admin instructions. Take one tablet (4 mg) by mouth daily with supper, take two tablets (8 mg) daily at bedtime, may also take one tablet (4 mg) in the morning   traZODone 50 MG tablet Commonly known as: DESYREL Take 50 mg by mouth at bedtime.   Evaristo Bury FlexTouch 100 UNIT/ML FlexTouch Pen Generic drug: insulin degludec Inject 30 Units into the skin daily.           Outstanding Labs/Studies    Duration of Discharge Encounter   Greater than 30 minutes including physician time.  Signed, Jonita Albee, PA-C 08/01/2022, 1:22 PM

## 2022-08-01 NOTE — Progress Notes (Signed)
CARDIAC REHAB PHASE I   PRE:  Rate/Rhythm: 65 NSR  BP:  Sitting: 125/70      SaO2: 98 RA  MODE:  Ambulation: 470 ft   AD:   no AD  POST:  Rate/Rhythm: 80 NSR  BP:  Sitting: 128/61      SaO2: 98 RA  Pt amb with supervision assistance, pt denies CP and SOB during amb and was returned to room w/o complaint.   Pt was educated on angioplasty, wt restrictions, no baths/daily wash-ups, s/s of infection, ex guidelines, s/s to stop exercising, NTG use and calling 911, heart healthy diet, risk factors and CRPII. Pt received materials on exercise, diet, and CRPII. Will refer to [facility].   Faustino Congress  ACSM-CEP 10:13 AM 08/01/2022

## 2022-08-01 NOTE — Discharge Instructions (Signed)
Groin Site Care °Refer to this sheet in the next few weeks. These instructions provide you with information on caring for yourself after your procedure. Your caregiver may also give you more specific instructions. Your treatment has been planned according to current medical practices, but problems sometimes occur. Call your caregiver if you have any problems or questions after your procedure. °HOME CARE INSTRUCTIONS °· You may shower 24 hours after the procedure. Remove the bandage (dressing) and gently wash the site with plain soap and water. Gently pat the site dry.  °· Do not apply powder or lotion to the site.  °· Do not sit in a bathtub, swimming pool, or whirlpool for 5 to 7 days.  °· No bending, squatting, or lifting anything over 10 pounds (4.5 kg) as directed by your caregiver.  °· Inspect the site at least twice daily.  °· Do not drive home if you are discharged the same day of the procedure. Have someone else drive you.  °· You may drive 24 hours after the procedure unless otherwise instructed by your caregiver.  °What to expect: °· Any bruising will usually fade within 1 to 2 weeks.  °· Blood that collects in the tissue (hematoma) may be painful to the touch. It should usually decrease in size and tenderness within 1 to 2 weeks.  °SEEK IMMEDIATE MEDICAL CARE IF: °· You have unusual pain at the groin site or down the affected leg.  °· You have redness, warmth, swelling, or pain at the groin site.  °· You have drainage (other than a small amount of blood on the dressing).  °· You have chills.  °· You have a fever or persistent symptoms for more than 72 hours.  °· You have a fever and your symptoms suddenly get worse.  °· Your leg becomes pale, cool, tingly, or numb.  °You have heavy bleeding from the site. Hold pressure on the site. . ° °

## 2022-08-09 NOTE — Progress Notes (Unsigned)
Cardiology Clinic Note   Patient Name: Terry Nelson Date of Encounter: 08/13/2022  Primary Care Provider:  Jiles Harold, PA-C Primary Cardiologist:  [name], MD  Patient Profile    Terry Nelson 66 year old male presents the clinic today for follow-up evaluation of his coronary artery disease.  Past Medical History    Past Medical History:  Diagnosis Date   Anxiety state    pt denies   Body mass index (bmi) 26.0-26.9, adult    CAD (coronary artery disease)    a. 2002 s/p CABG x 3 (LIMA->LAD, VG->OM1->OM2); b. s/p prior LAD and dRCA/RPDA stenting; c. 04/2019 PTCA & lithotripsy of ISR throughout LAD; d. 08/2020 PCI/scoring balloon PTCA throughout LAD 2/2 ISR; e. 09/2020 Cath: LM nl, LAD 40ost/p, 40p/d, 75d ISR (FFR 0.86), 50d, LCX small, OM1 90, OM2 25, RCA 20d ISR, RPDA 20 ISR, VG->OM1->OM2 ok, LIMA->LAD 194m-->Med rx.   Chronic heart failure with preserved ejection fraction (HFpEF) (HCC)    a. 09/2020 Echo: Ef 60-65%, no rwma, nl RV size/fxn. Mod dil LA. triv MR. Mild AoV sclerosis w/o stenosis.   CKD (chronic kidney disease), stage II    DDD (degenerative disc disease), lumbar    DM (diabetes mellitus) (HCC)    Gastritis    GERD (gastroesophageal reflux disease)    Headache    Hemorrhage    History of kidney stones    Hyperlipidemia    Hypertension    Kidney stones    Lumbago    Myocardial infarction [facility])    Neck pain    Paroxysmal A-fib (HCC)    a. CHA2DS2VASc = 4-->Eliquis; b. 06/2020 Zio: No afib. Up to 20 beats PSVT.   Pernicious anemia    Unstable angina (HCC)    Past Surgical History:  Procedure Laterality Date   ABLATION  2017   CARDIAC CATHETERIZATION     CORONARY ARTERY BYPASS GRAFT     CORONARY BALLOON ANGIOPLASTY N/A 04/27/2019   Procedure: CORONARY BALLOON ANGIOPLASTY;  Surgeon: Yvonne Kendall, MD;  Location: [facility];  Service: Cardiovascular;  Laterality: N/A;   CORONARY BALLOON ANGIOPLASTY N/A 12/16/2019   Procedure: CORONARY  BALLOON ANGIOPLASTY;  Surgeon: Lyn Records, MD;  Location: [facility];  Service: Cardiovascular;  Laterality: N/A;   CORONARY BALLOON ANGIOPLASTY N/A 09/06/2020   Procedure: CORONARY BALLOON ANGIOPLASTY;  Surgeon: Lyn Records, MD;  Location: [facility];  Service: Cardiovascular;  Laterality: N/A;   CORONARY BALLOON ANGIOPLASTY N/A 06/02/2021   Procedure: CORONARY BALLOON ANGIOPLASTY;  Surgeon: Lyn Records, MD;  Location: [facility];  Service: Cardiovascular;  Laterality: N/A;   CORONARY BALLOON ANGIOPLASTY N/A 07/31/2022   Procedure: CORONARY BALLOON ANGIOPLASTY;  Surgeon: Kathleene Hazel, MD;  Location: [facility];  Service: Cardiovascular;  Laterality: N/A;   CORONARY PRESSURE/FFR STUDY N/A 09/30/2020   Procedure: INTRAVASCULAR PRESSURE WIRE/FFR STUDY;  Surgeon: [name], MD;  Location: [facility];  Service: Cardiovascular;  Laterality: N/A;   CORONARY PRESSURE/FFR STUDY N/A 06/02/2021   Procedure: INTRAVASCULAR PRESSURE WIRE/FFR STUDY;  Surgeon: Lyn Records, MD;  [facility]: [facility];  Service: Cardiovascular;  Laterality: N/A;   CORONARY ULTRASOUND/IVUS N/A 04/27/2019   Procedure: Intravascular Ultrasound/IVUS;  Surgeon: Yvonne Kendall, MD;  Location: [facility];  Service: Cardiovascular;  Laterality: N/A;   CORONARY ULTRASOUND/IVUS N/A 12/16/2019   Procedure: Intravascular Ultrasound/IVUS;  Surgeon: Lyn Records, MD;  Location: [facility];  Service: Cardiovascular;  Laterality: N/A;   KNEE ARTHROSCOPY Bilateral    3 times on each knee   LEFT HEART CATH AND CORS/GRAFTS ANGIOGRAPHY N/A 04/09/2019   Procedure: LEFT HEART CATH AND CORS/GRAFTS ANGIOGRAPHY;  Surgeon: Lyn Records, MD;  [facility]: [facility];  Service: Cardiovascular;  Laterality: N/A;   LEFT HEART CATH AND CORS/GRAFTS ANGIOGRAPHY N/A 12/16/2019   Procedure: LEFT HEART CATH AND CORS/GRAFTS ANGIOGRAPHY;  Surgeon: Lyn Records,  MD;  [facility]: [facility];  Service: Cardiovascular;  Laterality: N/A;   LEFT HEART CATH AND CORS/GRAFTS ANGIOGRAPHY N/A 09/06/2020   Procedure: LEFT HEART CATH AND CORS/GRAFTS ANGIOGRAPHY;  Surgeon: Lyn Records, MD;  [facility]: [facility];  Service: Cardiovascular;  Laterality: N/A;   LEFT HEART CATH AND CORS/GRAFTS ANGIOGRAPHY N/A 09/30/2020   Procedure: LEFT HEART CATH AND CORS/GRAFTS ANGIOGRAPHY;  Surgeon: [name], MD;  [facility]: [facility];  Service: Cardiovascular;  Laterality: N/A;   LEFT HEART CATH AND CORS/GRAFTS ANGIOGRAPHY N/A 06/02/2021   Procedure: LEFT HEART CATH AND CORS/GRAFTS ANGIOGRAPHY;  Surgeon: Lyn Records, MD;  [facility]: [facility];  Service: Cardiovascular;  Laterality: N/A;   LEFT HEART CATH AND CORS/GRAFTS ANGIOGRAPHY N/A 07/31/2022   Procedure: LEFT HEART CATH AND CORS/GRAFTS ANGIOGRAPHY;  Surgeon: Kathleene Hazel, MD;  [facility]: [facility];  Service: Cardiovascular;  Laterality: N/A;   LUMBAR FUSION     L5-S1   LUMBAR LAMINECTOMY/DECOMPRESSION MICRODISCECTOMY N/A 04/13/2022   Procedure: LUMBAR THREE-FOUR LAMINECTOMY AND FORAMINOTOMY;  Surgeon: Dawley, Alan Mulder, DO;  Location: [facility];  Service: Neurosurgery;  Laterality: N/A;  3C   LUMBAR PERITONEAL SHUNT     pt states "this is blocked"   SHOULDER ARTHROSCOPY Bilateral     Allergies  Allergies  Allergen Reactions   Metoclopramide Other (See Comments)    DYSKINESIAS contractions of whole body   Penicillins Swelling and Rash    Did it involve swelling of the face/tongue/throat, SOB, or low BP? Yes Did it involve sudden or severe rash/hives, skin peeling, or any reaction on the inside of your mouth or nose? Unknown Did you need to seek medical attention at a hospital or doctor's office? Yes When did it last happen?More than 40 years ago    If all above answers are "NO", may proceed with cephalosporin use.     History of Present Illness    Terry Nelson  has a PMH of coronary artery disease, hyperlipidemia, anxiety, HFpEF, CKD stage II, degenerative disc disease, diabetes mellitus, GERD, HTN, and paroxysmal atrial fibrillation CHA2DS2-VASc score 4 on apixaban.  He is status post CABG 2002.  He has undergone multiple stenting's to his LAD.  He has also had prior stenting to his distal RCA and PDA.  He underwent cardiac catheterization 5/23 which showed patent LAD with high-grade stenosis and stented segments of his distal LAD.  He was treated with Cutting Balloon angioplasty which reduced his 95% stenosis down to 75%.  His LIMA-LAD was noted to be atretic.  Echocardiogram 1/23 showed normal LV function with mild mitral valve regurgitation.  He was seen and evaluated by Dr. Clifton James on 07/30/2022.  During that time he reported having severe daily chest pain for the past few weeks.  His discomfort was associated with dyspnea.  He reported that his pain was more severe than his baseline pain is similar to his symptoms when he was noted to have in-stent restenosis.  He underwent left heart cath  on 07/31/2022 and was noted to have distal LAD stenosis.  He underwent scoring balloon angioplasty and was noted to have 50% residual stenosis, 2/3 patent grafts.    He presents to the clinic today for follow-up evaluation and states he is feeling well.  He is back to being active and walking his dogs multiple times per day.  He is also caring for his wife.  We reviewed his cardiac catheterization.  He expressed understanding.  We reviewed the importance of heart healthy high-fiber diet.  I reviewed his lipid panel.  He expressed understanding.  I will start ezetimibe, repeat fasting lipids and LFTs in 6 to 8 weeks, give high-fiber diet information, and plan follow-up in 3 to 4 months..  Today he denies chest pain, shortness of breath, lower extremity edema, palpitations, melena, hematuria, hemoptysis, orthopnea, and PND.    Home Medications    Prior to Admission  medications   Medication Sig Start Date End Date Taking? Authorizing Provider  aspirin EC 81 MG tablet Take 1 tablet (81 mg total) by mouth daily. Swallow whole. 04/18/22   Lisbeth Renshaw, MD  atorvastatin (LIPITOR) 80 MG tablet Take 80 mg by mouth at bedtime.    [provider]  Blood Glucose Monitoring Suppl (CONTOUR NEXT MONITOR) w/Device KIT daily. 11/02/19   [provider]  dapagliflozin propanediol (FARXIGA) 10 MG TABS tablet Take 1 tablet (10 mg total) by mouth daily. 05/02/22   [name], MD  furosemide (LASIX) 40 MG tablet Take 40mg  (1 tablet) daily. Take additional 40mg  as needed for weight gain of 2 pounds overnight or 5 pounds in one week. Patient taking differently: Take 20 mg by mouth daily. Take 40mg  (1 tablet) daily. Take additional 40mg  as needed for weight gain of 2 pounds overnight or 5 pounds in one week. 12/25/21   Lyn Records, MD  gabapentin (NEURONTIN) 300 MG capsule Take 900-1,500 mg by mouth See admin instructions. Takes 900 mg in the morning and 1500 in the evening    [provider]  glucose blood (CONTOUR NEXT TEST) test strip by Does not apply route. 11/02/19   [provider]  Insulin Pen Needle (BD PEN NEEDLE NANO U/F) 32G X 4 MM MISC USE ONCE AS DIRECTED 08/09/19   [provider]  isosorbide mononitrate (IMDUR) 60 MG 24 hr tablet Take 2 tablets (120 mg total) by mouth daily. 02/16/21   Sharlene Dory, PA-C  losartan (COZAAR) 25 MG tablet Take 1 tablet (25 mg total) by mouth daily. 03/27/22   [name], MD  magnesium oxide (MAG-OX) 400 MG tablet Take 800 mg by mouth every morning. 01/10/21   [provider]  metoprolol (TOPROL XL) 200 MG 24 hr tablet Take 1 tablet (200 mg total) by mouth daily. 08/25/21   Lyn Records, MD  nitroGLYCERIN (NITROSTAT) 0.4 MG SL tablet Place 1 tablet (0.4 mg total) under the tongue every 5 (five) minutes as needed for chest pain. 08/01/22   Jonita Albee, PA-C   oxyCODONE-acetaminophen (PERCOCET) 10-325 MG tablet Take 1 tablet by mouth every 4 (four) hours as needed for pain.    [provider]  pantoprazole (PROTONIX) 40 MG tablet Take 40 mg by mouth every morning.    [provider]  Polyethylene Glycol 400 (BLINK TEARS) 0.25 % SOLN Place 1 drop into both eyes daily as needed (dry eyes).    [provider]  potassium chloride (KLOR-CON M) 10 MEQ tablet Take 1 tablet (10  mEq total) by mouth daily. 06/07/22   [name], MD  prasugrel (EFFIENT) 10 MG TABS tablet Take 1 tablet (10 mg total) by mouth every morning. 04/14/22   Lisbeth Renshaw, MD  promethazine (PHENERGAN) 25 MG tablet Take 25 mg by mouth 2 (two) times daily as needed for nausea or vomiting (Headache).    [provider]  ranolazine (RANEXA) 500 MG 12 hr tablet Take 1 tablet (500 mg total) by mouth 2 (two) times daily. 10/13/21   Lyn Records, MD  spironolactone (ALDACTONE) 25 MG tablet Take 25 mg by mouth every morning. 08/26/18   [provider]  tiZANidine (ZANAFLEX) 4 MG tablet Take 4-8 mg by mouth See admin instructions. Take one tablet (4 mg) by mouth daily with supper, take two tablets (8 mg) daily at bedtime, may also take one tablet (4 mg) in the morning    [provider]  traZODone (DESYREL) 50 MG tablet Take 50 mg by mouth at bedtime. 08/26/20   [provider]  TRESIBA FLEXTOUCH 100 UNIT/ML FlexTouch Pen Inject 30 Units into the skin daily.    [provider]    Family History    Family History  Problem Relation Age of Onset   High blood pressure Mother    Stroke Mother    Diabetes Father    Heart disease Other    He indicated that his mother is deceased. He indicated that his father is alive. He indicated that his maternal grandmother is deceased. He indicated that his maternal grandfather is deceased. He indicated that his paternal grandmother is deceased. He indicated that his paternal  grandfather is deceased. He indicated that his other is alive.  Social History    Social History   Socioeconomic History   Marital status: Married    Spouse name: Not on file   Number of children: 2   Years of education: Not on file   Highest education level: Not on file  Occupational History   Not on file  Tobacco Use   Smoking status: Never   Smokeless tobacco: Former    Types: Chew   Tobacco comments:    per pt chewed tobacco in high school   Vaping Use   Vaping status: Never Used  Substance and Sexual Activity   Alcohol use: Never   Drug use: Never   Sexual activity: Not on file    Comment: MARRIED  Other Topics Concern   Not on file  Social History Narrative   Not on file   Social Determinants of Health   Financial Resource Strain: Not on file  Food Insecurity: No Food Insecurity (07/31/2022)   Hunger Vital Sign    Worried About Running Out of Food in the Last Year: Never true    Ran Out of Food in the Last Year: Never true  Transportation Needs: No Transportation Needs (07/31/2022)   PRAPARE - Administrator, Civil Service (Medical): No    Lack of Transportation (Non-Medical): No  Physical Activity: Not on file  Stress: Not on file  Social Connections: Not on file  Intimate Partner Violence: Not At Risk (07/31/2022)   Humiliation, Afraid, Rape, and Kick questionnaire    Fear of Current or Ex-Partner: No    Emotionally Abused: No    Physically Abused: No    Sexually Abused: No     Review of Systems    General:  No chills, fever, night sweats or weight changes.  Cardiovascular:  No chest  pain, dyspnea on exertion, edema, orthopnea, palpitations, paroxysmal nocturnal dyspnea. Dermatological: No rash, lesions/masses Respiratory: No cough, dyspnea Urologic: No hematuria, dysuria Abdominal:   No nausea, vomiting, diarrhea, bright red blood per rectum, melena, or hematemesis Neurologic:  No visual changes, wkns, changes in mental status. All other  systems reviewed and are otherwise negative except as noted above.  Physical Exam    VS:  BP (!) 92/54 (BP Location: Left Arm, Patient Position: Sitting, Cuff Size: Normal)   Pulse (!) 54   Ht 5\' 7"  (1.702 m)   Wt 146 lb 3.2 oz (66.3 kg)   SpO2 98%   BMI 22.90 kg/m  , BMI Body mass index is 22.9 kg/m. GEN: Well nourished, well developed, in no acute distress. HEENT: normal. Neck: Supple, no JVD, carotid bruits, or masses. Cardiac: RRR, no murmurs, rubs, or gallops. No clubbing, cyanosis, edema.  Radials/DP/PT 2+ and equal bilaterally.  Respiratory:  Respirations regular and unlabored, clear to auscultation bilaterally. GI: Soft, nontender, nondistended, BS + x 4. MS: no deformity or atrophy. Skin: warm and dry, no rash. Neuro:  Strength and sensation are intact. Psych: Normal affect.  Accessory Clinical Findings    Recent Labs: 04/05/2022: ALT 12 08/01/2022: BUN 16; Creatinine, Ser 1.03; Hemoglobin 12.8; Platelets 145; Potassium 3.9; Sodium 139   Recent Lipid Panel    Component Value Date/Time   CHOL 141 04/05/2022 1158   TRIG 81 04/05/2022 1158   HDL 64 04/05/2022 1158   CHOLHDL 2.2 04/05/2022 1158   CHOLHDL 2.4 02/02/2021 0358   VLDL 14 02/02/2021 0358   LDLCALC 61 04/05/2022 1158         ECG personally reviewed by me today- EKG Interpretation Date/Time:  Monday August 13 2022 14:52:42 EDT Ventricular Rate:  56 PR Interval:  122 QRS Duration:  90 QT Interval:  432 QTC Calculation: 416 R Axis:   71  Text Interpretation: Sinus bradycardia T wave abnormality, consider lateral ischemia Confirmed by Edd Fabian [phone] on 08/13/2022 2:55:34 PM   Left heart cath 07/31/2022    Ost LAD to Prox LAD lesion is 40% stenosed.   Prox LAD to Mid LAD lesion is 40% stenosed with 40% stenosed side branch in 2nd Diag.   Dist RCA lesion is 20% stenosed.   1st Mrg lesion is 90% stenosed.   2nd Mrg lesion is 25% stenosed.   RPDA lesion is 20% stenosed.   Mid RCA to Dist RCA  lesion is 60% stenosed.   Dist LAD-3 lesion is 90% stenosed.   Dist LAD-2 lesion is 99% stenosed.   Dist LAD-1 lesion is 99% stenosed.   Scoring balloon angioplasty was performed using a BALLN SCOREFLEX 2.75X15.   Post intervention, there is a 50% residual stenosis.   Post intervention, there is a 50% residual stenosis.   LIMA graft was not injected.   SVG graft was visualized by angiography and is normal in caliber.   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  Moderate ostial/proximal LAD stenosis that does not appear to be flow limiting.  Multiple layers of stent in the proximal to distal LAD with severe mid to distal stented segment restenosis. Beyond the distal edge of the LAD stent the apical LAD becomes very small and is diffusely diseased. LIMA to mid LAD is known to be atretic and was not visualized.  Successful PTCA with scoring balloon angioplasty of the mid and distal LAD stented segment.  The Circumflex is patent. Severe stenosis ostium of the  first obtuse marginal branch. Patent sequential SVG to OM1 and OM2.  Native RCA with mild diffuse proximal and mid vessel stenosis, moderate distal vessel stenosis and patent distal RCA stent into the small caliber PDA Normal LV filling pressure   Recommendations: Continue current medical therapy. Monitor overnight post groin sheath pull and PCI. He has no good options for more definitive treatment of his recurrent stent restenosis in the entire LAD. There are multiple layers of stents and these are under expanded in several areas. Prior attempts at Shockwave lithotripsy in 2021 not successful.     Diagnostic Dominance: Right  Intervention       Echocardiogram 02/02/2021  IMPRESSIONS     1. Left ventricular ejection fraction, by estimation, is 60 to 65%. The  left ventricle has normal function. The left ventricle has no regional  wall motion abnormalities. There is mild left ventricular hypertrophy of  the  basal-septal segment. Left  ventricular diastolic parameters are consistent with Grade III diastolic  dysfunction (restrictive). Elevated left ventricular end-diastolic  pressure.   2. Right ventricular systolic function is normal. The right ventricular  size is normal. Tricuspid regurgitation signal is inadequate for assessing  PA pressure.   3. Left atrial size was severely dilated.   4. The mitral valve is degenerative. Mild mitral valve regurgitation. No  evidence of mitral stenosis.   5. The aortic valve is tricuspid. Aortic valve regurgitation is not  visualized. Aortic valve sclerosis is present, with no evidence of aortic  valve stenosis. Aortic valve Vmax measures 1.12 m/s.   6. The inferior vena cava is dilated in size with <50% respiratory  variability, suggesting right atrial pressure of 15 mmHg.   FINDINGS   Left Ventricle: Left ventricular ejection fraction, by estimation, is 60  to 65%. The left ventricle has normal function. The left ventricle has no  regional wall motion abnormalities. The left ventricular internal cavity  size was normal in size. There is   mild left ventricular hypertrophy of the basal-septal segment. Left  ventricular diastolic parameters are consistent with Grade III diastolic  dysfunction (restrictive). Elevated left ventricular end-diastolic  pressure.   Right Ventricle: The right ventricular size is normal. No increase in  right ventricular wall thickness. Right ventricular systolic function is  normal. Tricuspid regurgitation signal is inadequate for assessing PA  pressure.   Left Atrium: Left atrial size was severely dilated.   Right Atrium: Right atrial size was normal in size.   Pericardium: There is no evidence of pericardial effusion.   Mitral Valve: The mitral valve is degenerative in appearance. There is  mild thickening of the mitral valve leaflet(s). There is mild  calcification of the mitral valve leaflet(s). Mild mitral valve   regurgitation. No evidence of mitral valve stenosis.   Tricuspid Valve: The tricuspid valve is normal in structure. Tricuspid  valve regurgitation is trivial. No evidence of tricuspid stenosis.   Aortic Valve: The aortic valve is tricuspid. Aortic valve regurgitation is  not visualized. Aortic valve sclerosis is present, with no evidence of  aortic valve stenosis. Aortic valve peak gradient measures 5.0 mmHg.   Pulmonic Valve: The pulmonic valve was normal in structure. Pulmonic valve  regurgitation is not visualized. No evidence of pulmonic stenosis.   Aorta: The aortic root is normal in size and structure.   Venous: The inferior vena cava is dilated in size with less than 50%  respiratory variability, suggesting right atrial pressure of 15 mmHg.   IAS/Shunts: No atrial level shunt  detected by color flow Doppler.    Assessment & Plan   1.  Coronary artery disease-significant improvement in chest discomfort.  Underwent cardiac catheterization 07/31/2022 and underwent scoring balloon angioplasty to his distal LAD which was 90% stenosed and reduced to 50% stenosis. Continue aspirin, atorvastatin, Imdur, metoprolol, Ranexa, Praluent, nitroglycerin as needed   Essential hypertension-BP today 92/54. Maintain blood pressure log Heart healthy low-sodium diet Continue metoprolol, Imdur, spironolactone  Hyperlipidemia-LDL 61 on 04/05/22. Continue aspirin, atorvastatin Zetia 10 mg daily  Repeat fasting lipids and LFTs in 6-8 weeks  Diabetes mellitus-reports compliance with insulin, Farxiga Carb modified diet Increase physical activity as tolerated Follows with PCP  Disposition: Follow-up with Dr. Clifton James or APP in 3-4 months.  He wishes to be seen in follow-up by Dr. McAlhany/transition care.   Thomasene Ripple.  NP-C     08/13/2022, 2:55 PM  [facility] [address] Office [phone] Fax [phone]    I spent 14 minutes examining  this patient, reviewing medications, and using patient centered shared decision making involving her cardiac care.  Prior to her visit I spent greater than 20 minutes reviewing her past medical history,  medications, and prior cardiac tests.

## 2022-08-13 ENCOUNTER — Encounter: Payer: Self-pay | Admitting: General Practice

## 2022-08-13 ENCOUNTER — Ambulatory Visit: Payer: Managed Care, Other (non HMO) | Attending: General Practice | Admitting: General Practice

## 2022-08-13 VITALS — BP 92/54 | HR 54 | Ht 67.0 in | Wt 146.2 lb

## 2022-08-13 DIAGNOSIS — I10 Essential (primary) hypertension: Secondary | ICD-10-CM

## 2022-08-13 DIAGNOSIS — E118 Type 2 diabetes mellitus with unspecified complications: Secondary | ICD-10-CM

## 2022-08-13 DIAGNOSIS — E785 Hyperlipidemia, unspecified: Secondary | ICD-10-CM

## 2022-08-13 DIAGNOSIS — I25119 Atherosclerotic heart disease of native coronary artery with unspecified angina pectoris: Secondary | ICD-10-CM | POA: Diagnosis not present

## 2022-08-13 DIAGNOSIS — I480 Paroxysmal atrial fibrillation: Secondary | ICD-10-CM

## 2022-08-13 DIAGNOSIS — Z794 Long term (current) use of insulin: Secondary | ICD-10-CM

## 2022-08-13 MED ORDER — EZETIMIBE 10 MG PO TABS: 10.0000 mg | ORAL_TABLET | Freq: Every day | ORAL | 2 refills | Status: DC

## 2022-08-13 NOTE — Patient Instructions (Addendum)
Medication Instructions:  START EZETIMIBE 10MG  DAILY *If you need a refill on your cardiac medications before your next appointment, please call your pharmacy*  Lab Work: FASTING LIPID AND LFT If you have labs (blood work) drawn today and your tests are completely normal, you will receive your results only by:  MyChart Message (if you have MyChart) OR  A paper copy in the mail If you have any lab test that is abnormal or we need to change your treatment, we will call you to review the results.  Testing/Procedures: NONE  Other Instructions FOLLOW INCREASED FIBER DIET-ATTACHED INCREASE PHYSICAL ACTIVITY-AS TOLERATED  Follow-Up: At [facility], you and your health needs are our priority.  As part of our continuing mission to provide you with exceptional heart care, we have created designated Provider Care Teams.  These Care Teams include your primary Cardiologist (physician) and Advanced Practice Providers (APPs -  Physician Assistants and Nurse Practitioners) who all work together to provide you with the care you need, when you need it.  Your next appointment:   3-4 month(s)  Provider:   Kathleene Hazel, MD      High-Fiber Eating Plan Fiber, also called dietary fiber, is found in foods such as fruits, vegetables, whole grains, and beans. A high-fiber diet can be good for your health. Your health care provider may recommend a high-fiber diet to help: Prevent trouble pooping (constipation). Lower your cholesterol. Treat the following conditions: Hemorrhoids. This is inflammation of veins in the anus. Inflammation of specific areas of the digestive tract. Irritable bowel syndrome (IBS). This is a problem of the large intestine, also called the colon, that sometimes causes belly pain and bloating. Prevent overeating as part of a weight-loss plan. Lower the risk of heart disease, type 2 diabetes, and certain cancers. What are tips for following this plan? Reading  food labels  Check the nutrition facts label on foods for the amount of dietary fiber. Choose foods that have 4 grams of fiber or more per serving. The recommended goals for how much fiber you should eat each day include: Males 62 years old or younger: 30-34 g. Males over 68 years old: 28-34 g. Females 74 years old or younger: 25-28 g. Females over 59 years old: 22-25 g. Shopping Choose whole fruits and vegetables instead of processed. For example, choose apples instead of apple juice or applesauce. Choose a variety of high-fiber foods such as avocados, lentils, oats, and pinto beans. Read the nutrition facts label on foods. Check for foods with added fiber. These foods often have high sugar and salt (sodium) amounts per serving. Cooking Use whole-grain flour for baking and cooking. Cook with brown rice instead of white rice. Make meals that have a lot of beans and vegetables in them, such as chili or vegetable-based soups. Meal planning Start the day with a breakfast that is high in fiber, such as a cereal that has 5 g of fiber or more per serving. Eat breads and cereals that are made with whole-grain flour instead of refined flour or white flour. Eat brown rice, bulgur wheat, or millet instead of white rice. Use beans in place of meat in soups, salads, and pasta dishes. Be sure that half of the grains you eat each day are whole grains. General information You can get the recommended amount of dietary fiber by: Eating a variety of fruits, vegetables, grains, nuts, and beans. Taking a fiber supplement if you aren't able to eat enough fiber. It's better to get  fiber through food than from a supplement. Slowly increase how much fiber you eat. If you increase the amount of fiber you eat too quickly, you may have bloating, cramping, or gas. Drink plenty of water to help you digest fiber. Choose high-fiber snacks, such as berries, raw vegetables, nuts, and popcorn. What foods should I  eat? Fruits Berries. Pears. Apples. Oranges. Avocado. Prunes and raisins. Dried figs. Vegetables Sweet potatoes. Spinach. Kale. Artichokes. Cabbage. Broccoli. Cauliflower. Green peas. Carrots. Squash. Grains Whole-grain breads. Multigrain cereal. Oats and oatmeal. Brown rice. Barley. Bulgur wheat. Millet. Quinoa. Bran muffins. Popcorn. Rye wafer crackers. Meats and other proteins Navy beans, kidney beans, and pinto beans. Soybeans. Split peas. Lentils. Nuts and seeds. Dairy Fiber-fortified yogurt. Fortified means that fiber has been added to the product. Beverages Fiber-fortified soy milk. Fiber-fortified orange juice. Other foods Fiber bars. The items listed above may not be all the foods and drinks you can have. Talk to a dietitian to learn more. What foods should I avoid? Fruits Fruit juice. Cooked, strained fruit. Vegetables Fried potatoes. Canned vegetables. Well-cooked vegetables. Grains White bread. Pasta made with refined flour. White rice. Meats and other proteins Fatty meat. Fried chicken or fried fish. Dairy Milk. Cream cheese. Sour cream. Fats and oils Butters. Beverages Soft drinks. Other foods Cakes and pastries. The items listed above may not be all the foods and drinks you should avoid. Talk to a dietitian to learn more. This information is not intended to replace advice given to you by your health care provider. Make sure you discuss any questions you have with your health care provider. Document Revised: 03/19/2022 Document Reviewed: 03/19/2022 Elsevier Patient Education  2024 [employer].

## 2022-08-22 ENCOUNTER — Telehealth: Payer: Self-pay | Admitting: General Practice

## 2022-08-22 DIAGNOSIS — E785 Hyperlipidemia, unspecified: Secondary | ICD-10-CM

## 2022-08-22 NOTE — Telephone Encounter (Signed)
Left voicemail to return call to office.

## 2022-08-22 NOTE — Telephone Encounter (Signed)
Returned pt call. Pt has had diarrhea for two days now. He started Zetia late last week and since then this has started. He has stopped taking it because of this. Please advise.

## 2022-08-22 NOTE — Telephone Encounter (Signed)
Pt is returning nurse call and is requesting a callback. Please advise

## 2022-08-22 NOTE — Telephone Encounter (Signed)
Patient aware to d/c zetia. He states he is taking his atorvastatin as prescribed. Referral placed for lipid clinic.

## 2022-08-22 NOTE — Telephone Encounter (Signed)
  Pt c/o medication issue:  1. Name of Medication:   ezetimibe (ZETIA) 10 MG tablet    2. How are you currently taking this medication (dosage and times per day)?   Take 1 tablet (10 mg total) by mouth daily.    3. Are you having a reaction (difficulty breathing--STAT)? No   4. What is your medication issue? The patient mentioned that he has been experiencing diarrhea since starting this medication and has decided to stop taking it

## 2022-09-04 ENCOUNTER — Other Ambulatory Visit: Payer: Self-pay

## 2022-09-04 MED ORDER — METOPROLOL SUCCINATE ER 200 MG PO TB24: 200.0000 mg | ORAL_TABLET | Freq: Every day | ORAL | 3 refills | Status: DC

## 2022-09-06 IMAGING — CR DG CHEST 2V
2 series · 2 of 2 positions shown · non-contrast
Comparison: None.

CLINICAL DATA: chest pain

EXAM:
CHEST - 2 VIEW

[chest pa]
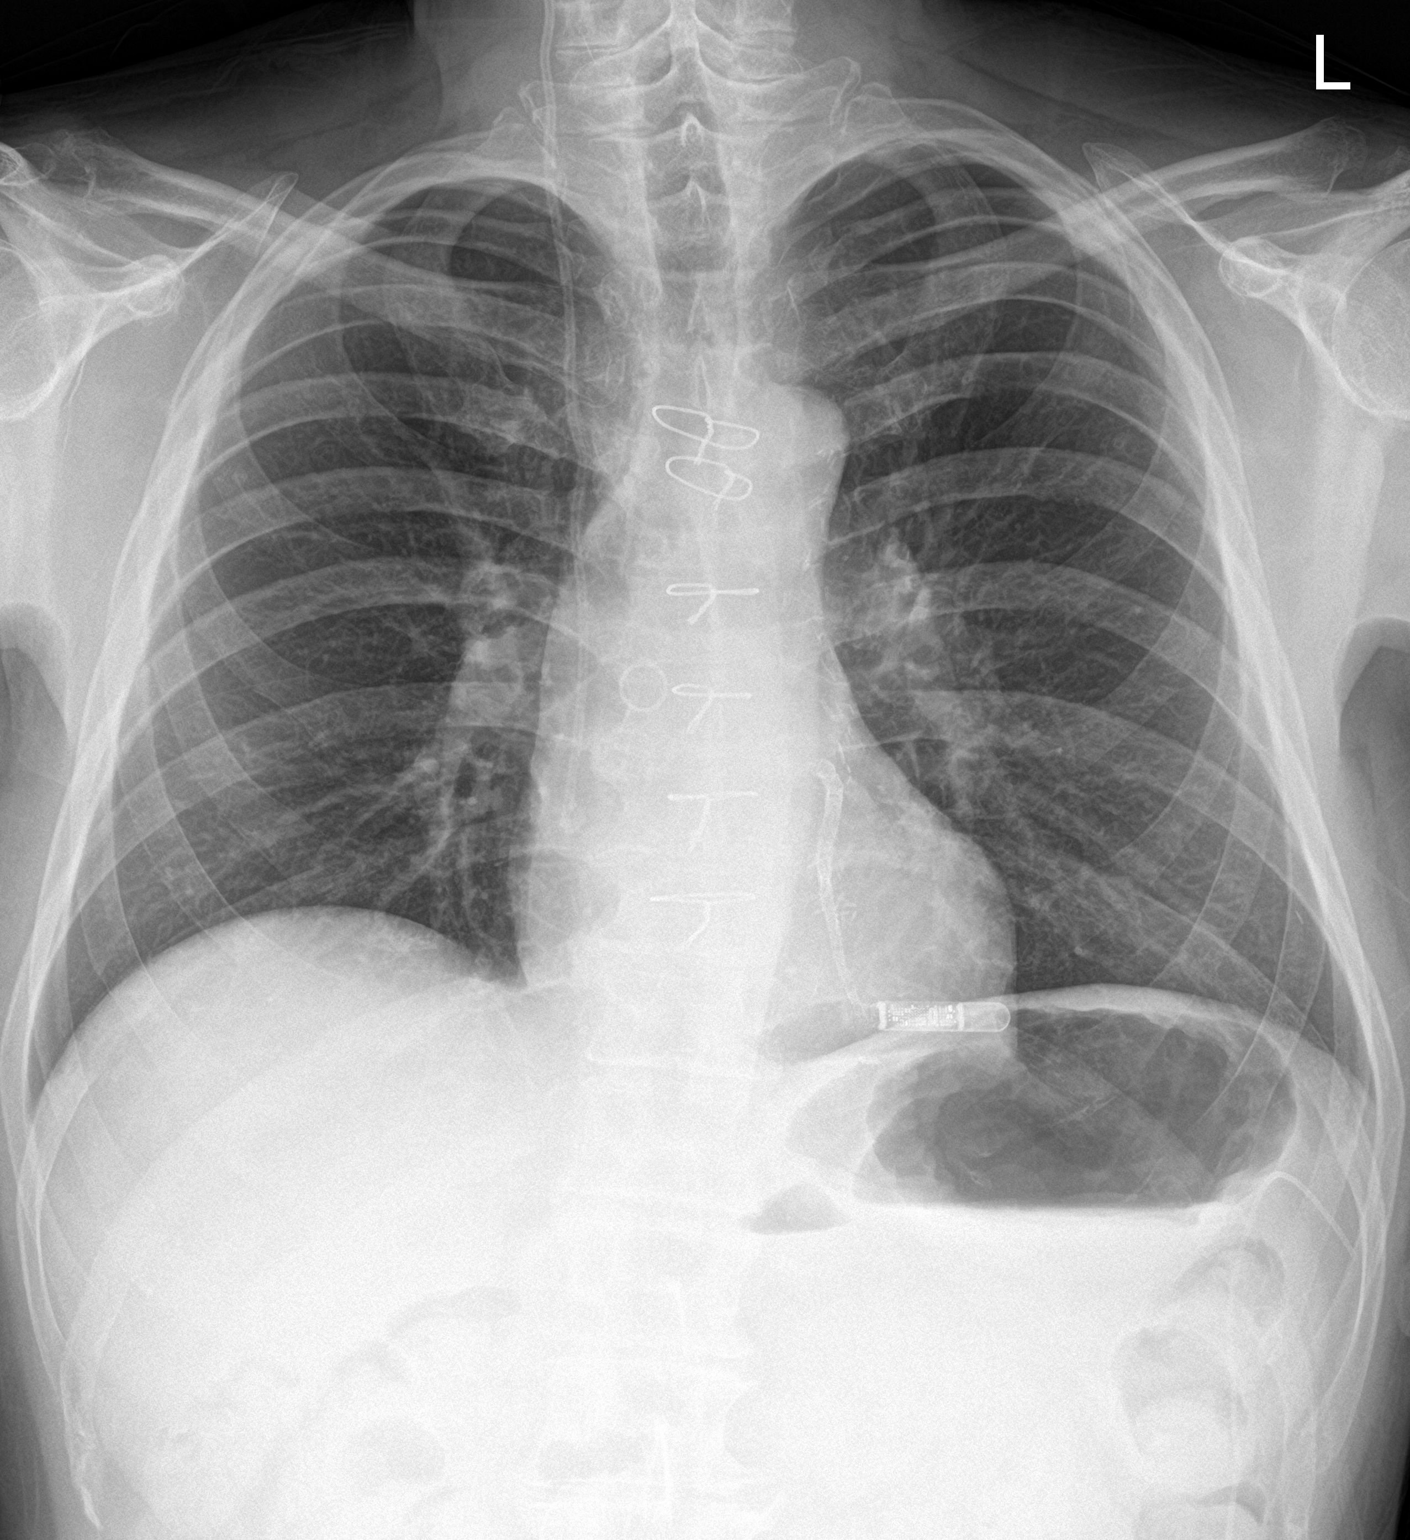

[chest lat]
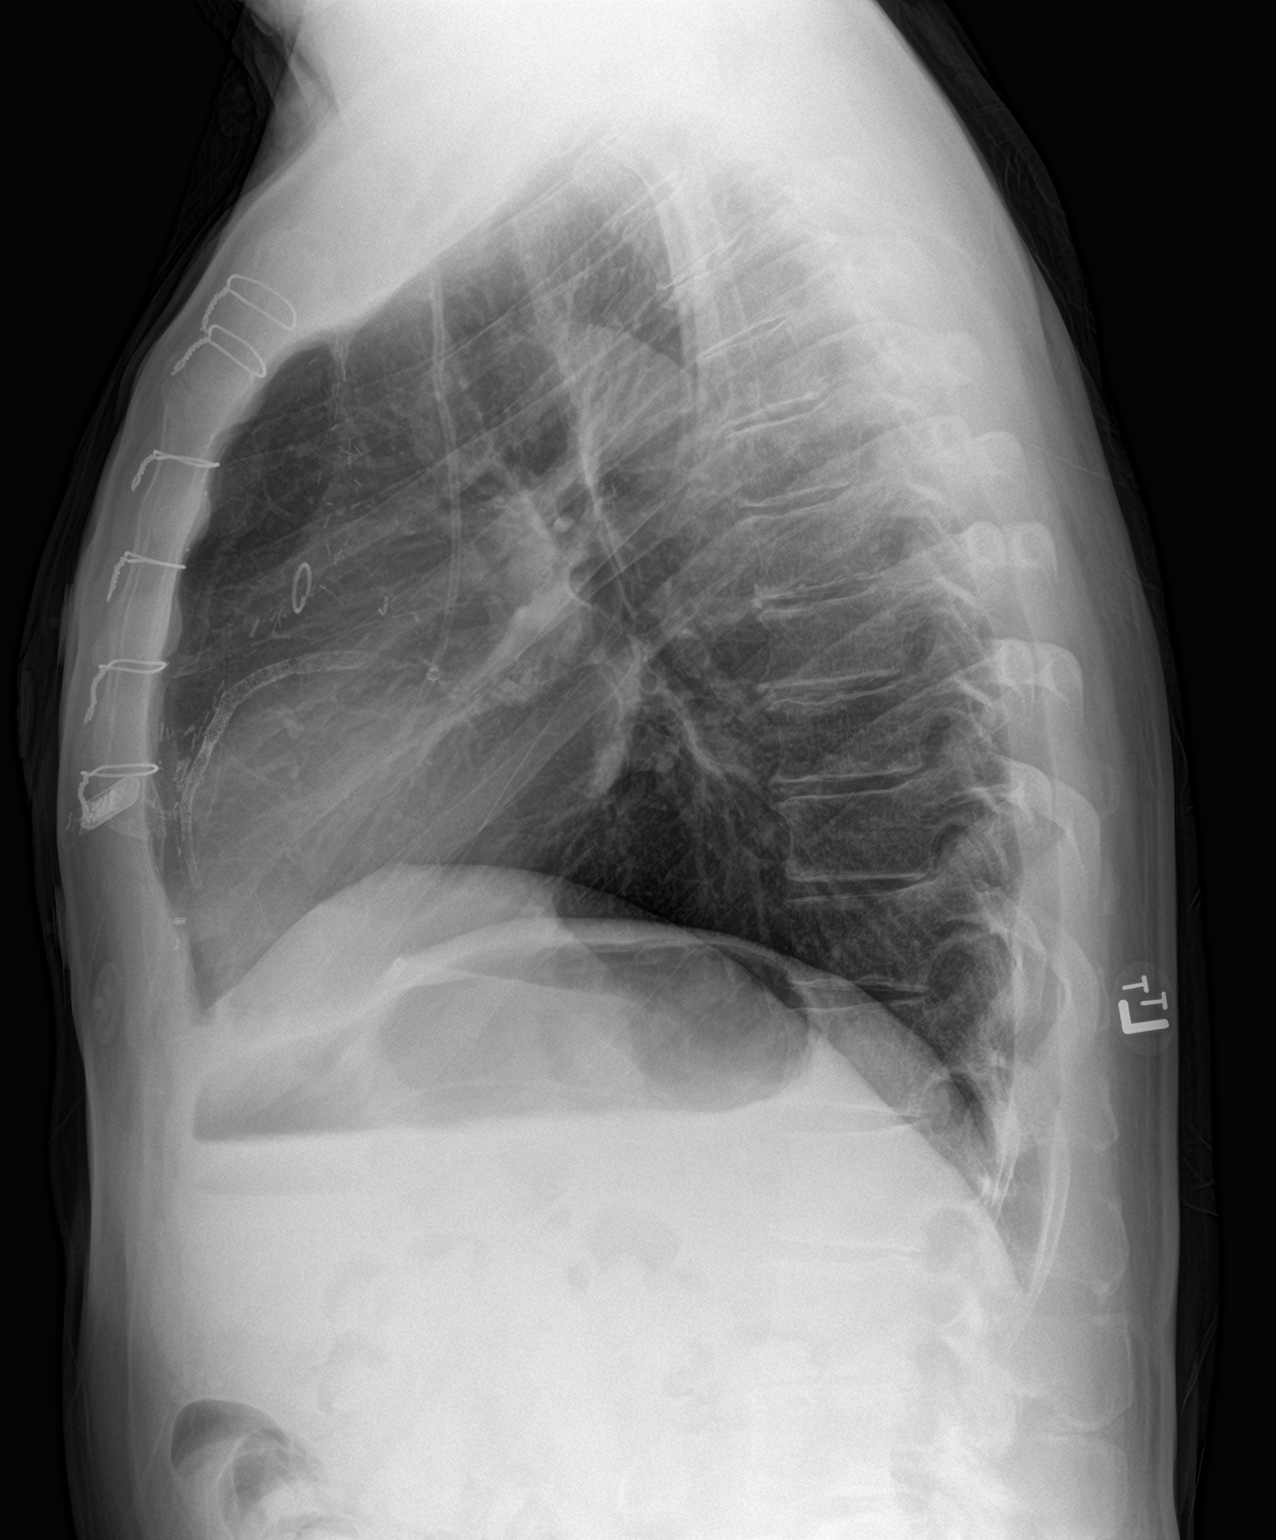

[2 of 2 positions shown; findings below may reference images not displayed]

FINDINGS: Right-sided transvenous catheter with tip overlying the right
atrium. Coronary artery stents/graft. Implantable loop recorder.
Cardiomediastinal silhouette is within normal limits. No pleural
effusion no pneumothorax. No focal consolidative process. Median
sternotomy wires without acute osseous abnormality.
IMPRESSION: No acute radiographic abnormality in the chest. Other stable
findings as described.

## 2022-09-17 ENCOUNTER — Other Ambulatory Visit: Payer: Self-pay

## 2022-09-17 MED ORDER — NITROGLYCERIN 0.4 MG SL SUBL: 0.4000 mg | SUBLINGUAL_TABLET | SUBLINGUAL | 3 refills | Status: DC | PRN

## 2022-09-21 NOTE — Progress Notes (Signed)
cutting balloon, 15 x 2.5 mm in diameter.  Maintained TIMI grade III flow. Proximal to ostial LAD 50 to 70% with RFR 0.96. Circumflex is patent. Sequential saphenous vein graft to obtuse marginals is patent. Native right coronary contains distal eccentric 70% stenosis and widely patent distal RCA to PDA stent Left main is short and widely patent Mild mid to apical anterior wall hypokinesis.  EF 55% with normal  LVEDP.  RECOMMENDATIONS:  Continue risk factor modification. Continue anti-ischemic therapy. Pull and whole sheath this evening with discharge in a.m. is anticipated. Continue long-term aspirin and Effient.  Findings Coronary Findings Diagnostic  Dominance: Right  Left Anterior Descending Ost LAD to Prox LAD lesion is 40% stenosed. Prox LAD to Mid LAD lesion is 40% stenosed with 40% stenosed side branch in 2nd Diag. The lesion was previously treated . Dist LAD-1 lesion is 95% stenosed. Vessel is the culprit lesion. The lesion is type C. The lesion was previously treated . Dist LAD-2 lesion is 95% stenosed. Vessel is the culprit lesion. The lesion is tubular. The lesion was previously treated . Dist LAD-3 lesion is 50% stenosed.  Left Circumflex Vessel is small.  First Obtuse Marginal Branch 1st Mrg lesion is 90% stenosed.  Second Obtuse Marginal Branch 2nd Mrg lesion is 25% stenosed.  Third Obtuse Marginal Branch Vessel is small in size.  Right Coronary Artery There is moderate diffuse disease throughout the vessel. Mid RCA to Dist RCA lesion is 70% stenosed. Dist RCA lesion is 20% stenosed. The lesion was previously treated .  Right Posterior Descending Artery There is moderate disease in the vessel. RPDA lesion is 20% stenosed. The lesion was previously treated .  First Right Posterolateral Branch Vessel is small in size.  Sequential Graft To 1st Mrg, 2nd Mrg  LIMA Graft To Mid LAD And is small. Mid Graft to Insertion lesion is 100% stenosed.  Intervention  Dist LAD-1 lesion Angioplasty Scoring balloon angioplasty was performed. Angioplasty (Also treats lesions: Dist LAD-2) Post-Intervention Lesion Assessment The intervention was successful. Pre-interventional TIMI flow is 3. Post-intervention TIMI flow is 3. There is a 75% residual stenosis post intervention.  Dist LAD-2 lesion Angioplasty (Also treats lesions: Dist LAD-1) Post-Intervention Lesion  Assessment The intervention was successful. Pre-interventional TIMI flow is 3. Post-intervention TIMI flow is 3. There is a 75% residual stenosis post intervention.     ECHOCARDIOGRAM  ECHOCARDIOGRAM COMPLETE 02/02/2021  Narrative ECHOCARDIOGRAM REPORT    Patient Name:   Terry Nelson Date of Exam: 02/02/2021 Medical Rec #:  [id number]     Height:       66.0 in Accession #:    [id number]    Weight:       146.0 lb Date of Birth:  Jul 08, 1955     BSA:          1.749 m Patient Age:    67 years      BP:           112/73 mmHg Patient Gender: M             HR:           63 bpm. Exam Location:  Inpatient  Procedure: 2D Echo  Indications:    Dyspnea  History:        Patient has prior history of Echocardiogram examinations, most recent 09/30/2020. CAD; Risk Factors:Hypertension.  Sonographer:    Eduard Roux Referring Phys: [id number] RONALD BERGE  IMPRESSIONS   1. Left ventricular ejection fraction, by estimation, is 60  Cardiology Office Note:   Date:  09/28/2022  ID:  Terry Nelson, DOB 08/24/1955, MRN [id number] PCP:  Jiles Harold, PA-C  [facility] Providers Cardiologist:  Alverda Skeans, MD Referring MD: Jiles Harold, PA-C   Chief Complaint/Reason for Referral:  Cardiology follow up ASSESSMENT:    1. Coronary artery disease involving native coronary artery of native heart with angina pectoris (HCC)   2. Type 2 diabetes mellitus with complication, with long-term current use of insulin (HCC)   3. Hypertension associated with diabetes (HCC)   4. Hyperlipidemia associated with type 2 diabetes mellitus (HCC)   5. CKD (chronic kidney disease) stage 2, GFR 60-89 ml/min     PLAN:   In order of problems listed above: 1.  Coronary artery disease: Underwent extensive scoring balloon angioplasty in July.  He has limited options for definitive therapy.  I imagine that he will need occasional balloon angioplasty for recurrent anginal symptoms in the future.  We potentially could pursue drug-eluting balloon angioplasty but given that the stents are constrained and not expanded well I do not think this would be of great utility.  Continue current therapy with Imdur and Toprol. 2.  Type 2 diabetes: Continue aspirin, Lipitor, Farxiga, and losartan. 3.  Hypertension: His blood pressure is actually relatively low.  Will decrease losartan to 12.5 mg. 4.  Hyperlipidemia: Given history of ACS goal LDL is less than 55.  Patient to meet with pharmacy today regarding PCSK9 inhibition.  He could not tolerate Zetia due to diarrhea. 5.  Chronic kidney disease: Continue losartan and Farxiga.             Dispo:  Return in about 6 months (around 03/28/2023).      Medication Adjustments/Labs and Tests Ordered: Current medicines are reviewed at length with the patient today.  Concerns regarding medicines are outlined above.  The following changes have been made:     Labs/tests ordered: No orders of the defined  types were placed in this encounter.   Medication Changes: Meds ordered this encounter  Medications   losartan (COZAAR) 25 MG tablet    Sig: Take 0.5 tablets (12.5 mg total) by mouth daily.    Dispense:  45 tablet    Refill:  3    Dose decrease    Current medicines are reviewed at length with the patient today.  The patient does not have concerns regarding medicines.  History of Present Illness:   FOCUSED PROBLEM LIST:   1.  Coronary artery disease status post CABG consisting of a LIMA to LAD and vein graft to obtuse marginal 1 sequential to obtuse marginal 2 in 2002; LIMA to LAD is now occluded. 2.  Extensive history of multiple percutaneous coronary interventions due to ACS with a full metal jacket of the proximal to distal LAD with multiple layers of stents and areas of undilated stents in the mid and distal LAD with most recent intervention in July 2024 with scoring balloon angioplasty 3.  Type 2 diabetes on insulin 4.  Hypertension 5.  Hyperlipidemia goal LDL < 55; low LP(a) 6.  CKD stage II 7.  BMI 30 March 2022:  The patient is a 67 y.o. male with the indicated medical history here for routine cardiology follow-up.  The patient was seen last by Dr. Katrinka Blazing around 6 months ago.  He had previously done cutting balloon angioplasty due to ongoing symptoms.  The patient developed an episode of severe chest pain following the procedure however at his last  Cardiology Office Note:   Date:  09/28/2022  ID:  Terry Nelson, DOB 08/24/1955, MRN [id number] PCP:  Jiles Harold, PA-C  [facility] Providers Cardiologist:  Alverda Skeans, MD Referring MD: Jiles Harold, PA-C   Chief Complaint/Reason for Referral:  Cardiology follow up ASSESSMENT:    1. Coronary artery disease involving native coronary artery of native heart with angina pectoris (HCC)   2. Type 2 diabetes mellitus with complication, with long-term current use of insulin (HCC)   3. Hypertension associated with diabetes (HCC)   4. Hyperlipidemia associated with type 2 diabetes mellitus (HCC)   5. CKD (chronic kidney disease) stage 2, GFR 60-89 ml/min     PLAN:   In order of problems listed above: 1.  Coronary artery disease: Underwent extensive scoring balloon angioplasty in July.  He has limited options for definitive therapy.  I imagine that he will need occasional balloon angioplasty for recurrent anginal symptoms in the future.  We potentially could pursue drug-eluting balloon angioplasty but given that the stents are constrained and not expanded well I do not think this would be of great utility.  Continue current therapy with Imdur and Toprol. 2.  Type 2 diabetes: Continue aspirin, Lipitor, Farxiga, and losartan. 3.  Hypertension: His blood pressure is actually relatively low.  Will decrease losartan to 12.5 mg. 4.  Hyperlipidemia: Given history of ACS goal LDL is less than 55.  Patient to meet with pharmacy today regarding PCSK9 inhibition.  He could not tolerate Zetia due to diarrhea. 5.  Chronic kidney disease: Continue losartan and Farxiga.             Dispo:  Return in about 6 months (around 03/28/2023).      Medication Adjustments/Labs and Tests Ordered: Current medicines are reviewed at length with the patient today.  Concerns regarding medicines are outlined above.  The following changes have been made:     Labs/tests ordered: No orders of the defined  types were placed in this encounter.   Medication Changes: Meds ordered this encounter  Medications   losartan (COZAAR) 25 MG tablet    Sig: Take 0.5 tablets (12.5 mg total) by mouth daily.    Dispense:  45 tablet    Refill:  3    Dose decrease    Current medicines are reviewed at length with the patient today.  The patient does not have concerns regarding medicines.  History of Present Illness:   FOCUSED PROBLEM LIST:   1.  Coronary artery disease status post CABG consisting of a LIMA to LAD and vein graft to obtuse marginal 1 sequential to obtuse marginal 2 in 2002; LIMA to LAD is now occluded. 2.  Extensive history of multiple percutaneous coronary interventions due to ACS with a full metal jacket of the proximal to distal LAD with multiple layers of stents and areas of undilated stents in the mid and distal LAD with most recent intervention in July 2024 with scoring balloon angioplasty 3.  Type 2 diabetes on insulin 4.  Hypertension 5.  Hyperlipidemia goal LDL < 55; low LP(a) 6.  CKD stage II 7.  BMI 30 March 2022:  The patient is a 67 y.o. male with the indicated medical history here for routine cardiology follow-up.  The patient was seen last by Dr. Katrinka Blazing around 6 months ago.  He had previously done cutting balloon angioplasty due to ongoing symptoms.  The patient developed an episode of severe chest pain following the procedure however at his last  cutting balloon, 15 x 2.5 mm in diameter.  Maintained TIMI grade III flow. Proximal to ostial LAD 50 to 70% with RFR 0.96. Circumflex is patent. Sequential saphenous vein graft to obtuse marginals is patent. Native right coronary contains distal eccentric 70% stenosis and widely patent distal RCA to PDA stent Left main is short and widely patent Mild mid to apical anterior wall hypokinesis.  EF 55% with normal  LVEDP.  RECOMMENDATIONS:  Continue risk factor modification. Continue anti-ischemic therapy. Pull and whole sheath this evening with discharge in a.m. is anticipated. Continue long-term aspirin and Effient.  Findings Coronary Findings Diagnostic  Dominance: Right  Left Anterior Descending Ost LAD to Prox LAD lesion is 40% stenosed. Prox LAD to Mid LAD lesion is 40% stenosed with 40% stenosed side branch in 2nd Diag. The lesion was previously treated . Dist LAD-1 lesion is 95% stenosed. Vessel is the culprit lesion. The lesion is type C. The lesion was previously treated . Dist LAD-2 lesion is 95% stenosed. Vessel is the culprit lesion. The lesion is tubular. The lesion was previously treated . Dist LAD-3 lesion is 50% stenosed.  Left Circumflex Vessel is small.  First Obtuse Marginal Branch 1st Mrg lesion is 90% stenosed.  Second Obtuse Marginal Branch 2nd Mrg lesion is 25% stenosed.  Third Obtuse Marginal Branch Vessel is small in size.  Right Coronary Artery There is moderate diffuse disease throughout the vessel. Mid RCA to Dist RCA lesion is 70% stenosed. Dist RCA lesion is 20% stenosed. The lesion was previously treated .  Right Posterior Descending Artery There is moderate disease in the vessel. RPDA lesion is 20% stenosed. The lesion was previously treated .  First Right Posterolateral Branch Vessel is small in size.  Sequential Graft To 1st Mrg, 2nd Mrg  LIMA Graft To Mid LAD And is small. Mid Graft to Insertion lesion is 100% stenosed.  Intervention  Dist LAD-1 lesion Angioplasty Scoring balloon angioplasty was performed. Angioplasty (Also treats lesions: Dist LAD-2) Post-Intervention Lesion Assessment The intervention was successful. Pre-interventional TIMI flow is 3. Post-intervention TIMI flow is 3. There is a 75% residual stenosis post intervention.  Dist LAD-2 lesion Angioplasty (Also treats lesions: Dist LAD-1) Post-Intervention Lesion  Assessment The intervention was successful. Pre-interventional TIMI flow is 3. Post-intervention TIMI flow is 3. There is a 75% residual stenosis post intervention.     ECHOCARDIOGRAM  ECHOCARDIOGRAM COMPLETE 02/02/2021  Narrative ECHOCARDIOGRAM REPORT    Patient Name:   Terry Nelson Date of Exam: 02/02/2021 Medical Rec #:  [id number]     Height:       66.0 in Accession #:    [id number]    Weight:       146.0 lb Date of Birth:  Jul 08, 1955     BSA:          1.749 m Patient Age:    67 years      BP:           112/73 mmHg Patient Gender: M             HR:           63 bpm. Exam Location:  Inpatient  Procedure: 2D Echo  Indications:    Dyspnea  History:        Patient has prior history of Echocardiogram examinations, most recent 09/30/2020. CAD; Risk Factors:Hypertension.  Sonographer:    Eduard Roux Referring Phys: [id number] RONALD BERGE  IMPRESSIONS   1. Left ventricular ejection fraction, by estimation, is 60  Cardiology Office Note:   Date:  09/28/2022  ID:  Terry Nelson, DOB 08/24/1955, MRN [id number] PCP:  Jiles Harold, PA-C  [facility] Providers Cardiologist:  Alverda Skeans, MD Referring MD: Jiles Harold, PA-C   Chief Complaint/Reason for Referral:  Cardiology follow up ASSESSMENT:    1. Coronary artery disease involving native coronary artery of native heart with angina pectoris (HCC)   2. Type 2 diabetes mellitus with complication, with long-term current use of insulin (HCC)   3. Hypertension associated with diabetes (HCC)   4. Hyperlipidemia associated with type 2 diabetes mellitus (HCC)   5. CKD (chronic kidney disease) stage 2, GFR 60-89 ml/min     PLAN:   In order of problems listed above: 1.  Coronary artery disease: Underwent extensive scoring balloon angioplasty in July.  He has limited options for definitive therapy.  I imagine that he will need occasional balloon angioplasty for recurrent anginal symptoms in the future.  We potentially could pursue drug-eluting balloon angioplasty but given that the stents are constrained and not expanded well I do not think this would be of great utility.  Continue current therapy with Imdur and Toprol. 2.  Type 2 diabetes: Continue aspirin, Lipitor, Farxiga, and losartan. 3.  Hypertension: His blood pressure is actually relatively low.  Will decrease losartan to 12.5 mg. 4.  Hyperlipidemia: Given history of ACS goal LDL is less than 55.  Patient to meet with pharmacy today regarding PCSK9 inhibition.  He could not tolerate Zetia due to diarrhea. 5.  Chronic kidney disease: Continue losartan and Farxiga.             Dispo:  Return in about 6 months (around 03/28/2023).      Medication Adjustments/Labs and Tests Ordered: Current medicines are reviewed at length with the patient today.  Concerns regarding medicines are outlined above.  The following changes have been made:     Labs/tests ordered: No orders of the defined  types were placed in this encounter.   Medication Changes: Meds ordered this encounter  Medications   losartan (COZAAR) 25 MG tablet    Sig: Take 0.5 tablets (12.5 mg total) by mouth daily.    Dispense:  45 tablet    Refill:  3    Dose decrease    Current medicines are reviewed at length with the patient today.  The patient does not have concerns regarding medicines.  History of Present Illness:   FOCUSED PROBLEM LIST:   1.  Coronary artery disease status post CABG consisting of a LIMA to LAD and vein graft to obtuse marginal 1 sequential to obtuse marginal 2 in 2002; LIMA to LAD is now occluded. 2.  Extensive history of multiple percutaneous coronary interventions due to ACS with a full metal jacket of the proximal to distal LAD with multiple layers of stents and areas of undilated stents in the mid and distal LAD with most recent intervention in July 2024 with scoring balloon angioplasty 3.  Type 2 diabetes on insulin 4.  Hypertension 5.  Hyperlipidemia goal LDL < 55; low LP(a) 6.  CKD stage II 7.  BMI 30 March 2022:  The patient is a 67 y.o. male with the indicated medical history here for routine cardiology follow-up.  The patient was seen last by Dr. Katrinka Blazing around 6 months ago.  He had previously done cutting balloon angioplasty due to ongoing symptoms.  The patient developed an episode of severe chest pain following the procedure however at his last  Cardiology Office Note:   Date:  09/28/2022  ID:  Terry Nelson, DOB 08/24/1955, MRN [id number] PCP:  Jiles Harold, PA-C  [facility] Providers Cardiologist:  Alverda Skeans, MD Referring MD: Jiles Harold, PA-C   Chief Complaint/Reason for Referral:  Cardiology follow up ASSESSMENT:    1. Coronary artery disease involving native coronary artery of native heart with angina pectoris (HCC)   2. Type 2 diabetes mellitus with complication, with long-term current use of insulin (HCC)   3. Hypertension associated with diabetes (HCC)   4. Hyperlipidemia associated with type 2 diabetes mellitus (HCC)   5. CKD (chronic kidney disease) stage 2, GFR 60-89 ml/min     PLAN:   In order of problems listed above: 1.  Coronary artery disease: Underwent extensive scoring balloon angioplasty in July.  He has limited options for definitive therapy.  I imagine that he will need occasional balloon angioplasty for recurrent anginal symptoms in the future.  We potentially could pursue drug-eluting balloon angioplasty but given that the stents are constrained and not expanded well I do not think this would be of great utility.  Continue current therapy with Imdur and Toprol. 2.  Type 2 diabetes: Continue aspirin, Lipitor, Farxiga, and losartan. 3.  Hypertension: His blood pressure is actually relatively low.  Will decrease losartan to 12.5 mg. 4.  Hyperlipidemia: Given history of ACS goal LDL is less than 55.  Patient to meet with pharmacy today regarding PCSK9 inhibition.  He could not tolerate Zetia due to diarrhea. 5.  Chronic kidney disease: Continue losartan and Farxiga.             Dispo:  Return in about 6 months (around 03/28/2023).      Medication Adjustments/Labs and Tests Ordered: Current medicines are reviewed at length with the patient today.  Concerns regarding medicines are outlined above.  The following changes have been made:     Labs/tests ordered: No orders of the defined  types were placed in this encounter.   Medication Changes: Meds ordered this encounter  Medications   losartan (COZAAR) 25 MG tablet    Sig: Take 0.5 tablets (12.5 mg total) by mouth daily.    Dispense:  45 tablet    Refill:  3    Dose decrease    Current medicines are reviewed at length with the patient today.  The patient does not have concerns regarding medicines.  History of Present Illness:   FOCUSED PROBLEM LIST:   1.  Coronary artery disease status post CABG consisting of a LIMA to LAD and vein graft to obtuse marginal 1 sequential to obtuse marginal 2 in 2002; LIMA to LAD is now occluded. 2.  Extensive history of multiple percutaneous coronary interventions due to ACS with a full metal jacket of the proximal to distal LAD with multiple layers of stents and areas of undilated stents in the mid and distal LAD with most recent intervention in July 2024 with scoring balloon angioplasty 3.  Type 2 diabetes on insulin 4.  Hypertension 5.  Hyperlipidemia goal LDL < 55; low LP(a) 6.  CKD stage II 7.  BMI 30 March 2022:  The patient is a 67 y.o. male with the indicated medical history here for routine cardiology follow-up.  The patient was seen last by Dr. Katrinka Blazing around 6 months ago.  He had previously done cutting balloon angioplasty due to ongoing symptoms.  The patient developed an episode of severe chest pain following the procedure however at his last  cutting balloon, 15 x 2.5 mm in diameter.  Maintained TIMI grade III flow. Proximal to ostial LAD 50 to 70% with RFR 0.96. Circumflex is patent. Sequential saphenous vein graft to obtuse marginals is patent. Native right coronary contains distal eccentric 70% stenosis and widely patent distal RCA to PDA stent Left main is short and widely patent Mild mid to apical anterior wall hypokinesis.  EF 55% with normal  LVEDP.  RECOMMENDATIONS:  Continue risk factor modification. Continue anti-ischemic therapy. Pull and whole sheath this evening with discharge in a.m. is anticipated. Continue long-term aspirin and Effient.  Findings Coronary Findings Diagnostic  Dominance: Right  Left Anterior Descending Ost LAD to Prox LAD lesion is 40% stenosed. Prox LAD to Mid LAD lesion is 40% stenosed with 40% stenosed side branch in 2nd Diag. The lesion was previously treated . Dist LAD-1 lesion is 95% stenosed. Vessel is the culprit lesion. The lesion is type C. The lesion was previously treated . Dist LAD-2 lesion is 95% stenosed. Vessel is the culprit lesion. The lesion is tubular. The lesion was previously treated . Dist LAD-3 lesion is 50% stenosed.  Left Circumflex Vessel is small.  First Obtuse Marginal Branch 1st Mrg lesion is 90% stenosed.  Second Obtuse Marginal Branch 2nd Mrg lesion is 25% stenosed.  Third Obtuse Marginal Branch Vessel is small in size.  Right Coronary Artery There is moderate diffuse disease throughout the vessel. Mid RCA to Dist RCA lesion is 70% stenosed. Dist RCA lesion is 20% stenosed. The lesion was previously treated .  Right Posterior Descending Artery There is moderate disease in the vessel. RPDA lesion is 20% stenosed. The lesion was previously treated .  First Right Posterolateral Branch Vessel is small in size.  Sequential Graft To 1st Mrg, 2nd Mrg  LIMA Graft To Mid LAD And is small. Mid Graft to Insertion lesion is 100% stenosed.  Intervention  Dist LAD-1 lesion Angioplasty Scoring balloon angioplasty was performed. Angioplasty (Also treats lesions: Dist LAD-2) Post-Intervention Lesion Assessment The intervention was successful. Pre-interventional TIMI flow is 3. Post-intervention TIMI flow is 3. There is a 75% residual stenosis post intervention.  Dist LAD-2 lesion Angioplasty (Also treats lesions: Dist LAD-1) Post-Intervention Lesion  Assessment The intervention was successful. Pre-interventional TIMI flow is 3. Post-intervention TIMI flow is 3. There is a 75% residual stenosis post intervention.     ECHOCARDIOGRAM  ECHOCARDIOGRAM COMPLETE 02/02/2021  Narrative ECHOCARDIOGRAM REPORT    Patient Name:   Terry Nelson Date of Exam: 02/02/2021 Medical Rec #:  [id number]     Height:       66.0 in Accession #:    [id number]    Weight:       146.0 lb Date of Birth:  Jul 08, 1955     BSA:          1.749 m Patient Age:    67 years      BP:           112/73 mmHg Patient Gender: M             HR:           63 bpm. Exam Location:  Inpatient  Procedure: 2D Echo  Indications:    Dyspnea  History:        Patient has prior history of Echocardiogram examinations, most recent 09/30/2020. CAD; Risk Factors:Hypertension.  Sonographer:    Eduard Roux Referring Phys: [id number] CHRISTOPHER RONALD BERGE  IMPRESSIONS   1. Left ventricular ejection fraction, by estimation, is 60

## 2022-09-28 ENCOUNTER — Ambulatory Visit: Payer: Managed Care, Other (non HMO) | Attending: Internal Medicine | Admitting: Internal Medicine

## 2022-09-28 ENCOUNTER — Encounter: Payer: Self-pay | Admitting: Student

## 2022-09-28 ENCOUNTER — Ambulatory Visit (INDEPENDENT_AMBULATORY_CARE_PROVIDER_SITE_OTHER): Payer: Managed Care, Other (non HMO) | Admitting: Student

## 2022-09-28 ENCOUNTER — Telehealth: Payer: Self-pay | Admitting: Pharmacist

## 2022-09-28 ENCOUNTER — Encounter: Payer: Self-pay | Admitting: Internal Medicine

## 2022-09-28 ENCOUNTER — Telehealth: Payer: Self-pay

## 2022-09-28 ENCOUNTER — Other Ambulatory Visit (HOSPITAL_COMMUNITY): Payer: Self-pay

## 2022-09-28 VITALS — BP 90/48 | HR 48 | Ht 67.0 in | Wt 146.0 lb

## 2022-09-28 DIAGNOSIS — Z794 Long term (current) use of insulin: Secondary | ICD-10-CM

## 2022-09-28 DIAGNOSIS — I25119 Atherosclerotic heart disease of native coronary artery with unspecified angina pectoris: Secondary | ICD-10-CM

## 2022-09-28 DIAGNOSIS — E785 Hyperlipidemia, unspecified: Secondary | ICD-10-CM

## 2022-09-28 DIAGNOSIS — I152 Hypertension secondary to endocrine disorders: Secondary | ICD-10-CM

## 2022-09-28 DIAGNOSIS — N182 Chronic kidney disease, stage 2 (mild): Secondary | ICD-10-CM

## 2022-09-28 DIAGNOSIS — E1159 Type 2 diabetes mellitus with other circulatory complications: Secondary | ICD-10-CM | POA: Diagnosis not present

## 2022-09-28 DIAGNOSIS — E1169 Type 2 diabetes mellitus with other specified complication: Secondary | ICD-10-CM

## 2022-09-28 DIAGNOSIS — E118 Type 2 diabetes mellitus with unspecified complications: Secondary | ICD-10-CM

## 2022-09-28 MED ORDER — REPATHA SURECLICK 140 MG/ML ~~LOC~~ SOAJ
140.0000 mg | SUBCUTANEOUS | 3 refills | Status: DC
  Filled 2022-09-28: qty 6, 84d supply, fill #0
  Filled 2022-10-11: qty 2, 28d supply, fill #0

## 2022-09-28 MED ORDER — LOSARTAN POTASSIUM 25 MG PO TABS: 12.5000 mg | ORAL_TABLET | Freq: Every day | ORAL | 3 refills | Status: AC

## 2022-09-28 NOTE — Progress Notes (Signed)
Patient ID: Terry Nelson                 DOB: 1955/03/01                    MRN: [id number]      HPI: Terry Nelson is a 67 y.o. male patient referred to lipid clinic by Dr.Thukkani. PMH is significant for T2DM, CKD stage II, HTN, HLD, CAD s/p CABG, multiple PCI unstable angina, PAF.   Patient presented today for lipid clinic. Reports he tolerates Lipitor max dose well without any side effects, he had tried Zetia in the past unable to tolerate due to diarrhea. He eats healthy home cooked meals - low in salt, sugar and fat. Unable to do any exercise due to chronic pain and his busy schedule. Reports  Wife is bed bound he is busy taking care of her. He was mot feeling well right before our visit started. He was given small can of coke and some candies. His BG initially close to 50 but after drinking coke it went up to 68. Patient drive back to [place] so encourage him to eat snack/ meal with complex carb ( carb+protein) before he go home.  We Reviewed  PCSK-9 inhibitors,   Discussed mechanisms of action, dosing, side effects and potential decreases in LDL cholesterol.  Also reviewed cost information and potential options for patient assistance.   Current Medications: atorvastatin 80 mg daily  Intolerances: Zetia - diarrhea Risk Factors: T2DM, CKD stage II, HTN, HLD, CAD s/p CABG, multiple PCI unstable angina LDL goal: <55 mg/dl  Last Lab: TC 295, TG 81, HDL 64, LDL 61  Diet: low salt, low fat diet   Exercise: none   Family History:   Relation Problem Comments  Mother (Deceased) High blood pressure   Stroke     Father Metallurgist) Diabetes     Maternal Grandmother (Deceased)   Maternal Grandfather (Deceased)   Paternal Grandmother (Deceased)   Paternal Grandfather (Deceased)      Social History: alcohol: none  Smoking: never   Labs: Lipid Panel     Component Value Date/Time   CHOL 141 04/05/2022 1158   TRIG 81 04/05/2022 1158   HDL 64 04/05/2022 1158   CHOLHDL 2.2 04/05/2022  1158   CHOLHDL 2.4 02/02/2021 0358   VLDL 14 02/02/2021 0358   LDLCALC 61 04/05/2022 1158   LABVLDL 16 04/05/2022 1158    Past Medical History:  Diagnosis Date   Anxiety state    pt denies   Body mass index (bmi) 26.0-26.9, adult    CAD (coronary artery disease)    a. 2002 s/p CABG x 3 (LIMA->LAD, VG->OM1->OM2); b. s/p prior LAD and dRCA/RPDA stenting; c. 04/2019 PTCA & lithotripsy of ISR throughout LAD; d. 08/2020 PCI/scoring balloon PTCA throughout LAD 2/2 ISR; e. 09/2020 Cath: LM nl, LAD 40ost/p, 40p/d, 75d ISR (FFR 0.86), 50d, LCX small, OM1 90, OM2 25, RCA 20d ISR, RPDA 20 ISR, VG->OM1->OM2 ok, LIMA->LAD 177m-->Med rx.   Chronic heart failure with preserved ejection fraction (HFpEF) (HCC)    a. 09/2020 Echo: Ef 60-65%, no rwma, nl RV size/fxn. Mod dil LA. triv MR. Mild AoV sclerosis w/o stenosis.   CKD (chronic kidney disease), stage II    DDD (degenerative disc disease), lumbar    DM (diabetes mellitus) (HCC)    Gastritis    GERD (gastroesophageal reflux disease)    Headache    Hemorrhage    History of kidney stones  Hyperlipidemia    Hyperlipidemia LDL goal <70 04/09/2019   Hypertension    Kidney stones    Lumbago    Myocardial infarction [facility])    Neck pain    Paroxysmal A-fib (HCC)    a. CHA2DS2VASc = 4-->Eliquis; b. 06/2020 Zio: No afib. Up to 20 beats PSVT.   Pernicious anemia    Unstable angina (HCC)     Current Outpatient Medications on File Prior to Visit  Medication Sig Dispense Refill   aspirin EC 81 MG tablet Take 1 tablet (81 mg total) by mouth daily. Swallow whole. 30 tablet 11   atorvastatin (LIPITOR) 80 MG tablet Take 80 mg by mouth at bedtime.     Blood Glucose Monitoring Suppl (CONTOUR NEXT MONITOR) w/Device KIT daily.     Continuous Glucose Sensor (FREESTYLE LIBRE 3 SENSOR) MISC USE TO TEST BLOOD SUGAR AND CHANGE EVERY 14 DAYS AS DIRECTED     dapagliflozin propanediol (FARXIGA) 10 MG TABS tablet Take 1 tablet (10 mg total) by mouth daily. 90 tablet 3    furosemide (LASIX) 40 MG tablet Take 40mg  (1 tablet) daily. Take additional 40mg  as needed for weight gain of 2 pounds overnight or 5 pounds in one week. (Patient taking differently: Take 20 mg by mouth daily. Take 40mg  (1 tablet) daily. Take additional 40mg  as needed for weight gain of 2 pounds overnight or 5 pounds in one week.) 45 tablet 9   gabapentin (NEURONTIN) 300 MG capsule Take 900-1,500 mg by mouth See admin instructions. Takes 900 mg in the morning and 1500 in the evening     glucose blood (CONTOUR NEXT TEST) test strip by Does not apply route.     Insulin Pen Needle (BD PEN NEEDLE NANO U/F) 32G X 4 MM MISC USE ONCE AS DIRECTED     isosorbide mononitrate (IMDUR) 60 MG 24 hr tablet Take 2 tablets (120 mg total) by mouth daily. 180 tablet 3   losartan (COZAAR) 25 MG tablet Take 0.5 tablets (12.5 mg total) by mouth daily. 45 tablet 3   magnesium oxide (MAG-OX) 400 MG tablet Take 800 mg by mouth every morning.     metoprolol (TOPROL XL) 200 MG 24 hr tablet Take 1 tablet (200 mg total) by mouth daily. 90 tablet 3   nitroGLYCERIN (NITROSTAT) 0.4 MG SL tablet Place 1 tablet (0.4 mg total) under the tongue every 5 (five) minutes as needed for chest pain. 75 tablet 3   oxyCODONE-acetaminophen (PERCOCET) 10-325 MG tablet Take 1 tablet by mouth every 4 (four) hours as needed for pain.     pantoprazole (PROTONIX) 40 MG tablet Take 40 mg by mouth every morning.     Polyethylene Glycol 400 (BLINK TEARS) 0.25 % SOLN Place 1 drop into both eyes daily as needed (dry eyes).     potassium chloride (KLOR-CON M) 10 MEQ tablet Take 1 tablet (10 mEq total) by mouth daily. 90 tablet 3   prasugrel (EFFIENT) 10 MG TABS tablet Take 1 tablet (10 mg total) by mouth every morning. 30 tablet 0   promethazine (PHENERGAN) 25 MG tablet Take 25 mg by mouth 2 (two) times daily as needed for nausea or vomiting (Headache).     ranolazine (RANEXA) 500 MG 12 hr tablet Take 1 tablet (500 mg total) by mouth 2 (two) times daily. 180  tablet 3   spironolactone (ALDACTONE) 25 MG tablet Take 25 mg by mouth every morning.     tiZANidine (ZANAFLEX) 4 MG tablet Take 4-8 mg by mouth See  admin instructions. Take one tablet (4 mg) by mouth daily with supper, take two tablets (8 mg) daily at bedtime, may also take one tablet (4 mg) in the morning     traZODone (DESYREL) 50 MG tablet Take 50 mg by mouth at bedtime.     TRESIBA FLEXTOUCH 100 UNIT/ML FlexTouch Pen Inject 30 Units into the skin daily.     No current facility-administered medications on file prior to visit.    Allergies  Allergen Reactions   Metoclopramide Other (See Comments)    DYSKINESIAS contractions of whole body   Zetia [Ezetimibe] Diarrhea   Penicillins Swelling and Rash    Did it involve swelling of the face/tongue/throat, SOB, or low BP? Yes Did it involve sudden or severe rash/hives, skin peeling, or any reaction on the inside of your mouth or nose? Unknown Did you need to seek medical attention at a hospital or doctor's office? Yes When did it last happen?More than 40 years ago    If all above answers are "NO", may proceed with cephalosporin use.     Assessment/Plan:  1. Hyperlipidemia -  Problem  Hyperlipidemia Ldl Goal <50   Current Medications: atorvastatin 80 mg daily  Intolerances: Zetia - diarrhea Risk Factors: T2DM, CKD stage II, HTN, HLD, CAD s/p CABG, multiple PCI unstable angina LDL goal: <55 mg/dl  Last Lab: TC 062, TG 81, HDL 64, LDL 61   Hyperlipidemia Ldl Goal <70 (Resolved)   Hyperlipidemia LDL goal <50 Assessment:  LDL goal: < 55 mg/dl last LDLc 61 mg/dl (69/4854) while on high intensity statin  Tolerates atorvastatin 80 mg  well without any side effects  Intolerance to Zetia - diarrhea  Discussed next potential options -PCSK-9 inhibitors; cost, dosing efficacy, side effects    Plan: Continue taking current medications (Lipitor 80 mg daily) Received PA approval for Repatha 140 mg SQ Q14d, prescription sent to [facility]  outpatient pharmacy  Lipid lab due in 2-3 months after starting PCSK9i    Thank you,  Carmela Hurt, Pharm.D [facility] [address], [place], [place] [address]  Phone: [phone]; Fax: [phone]

## 2022-09-28 NOTE — Assessment & Plan Note (Signed)
Assessment:  LDL goal: < 55 mg/dl last LDLc 61 mg/dl (16/1096) while on high intensity statin  Tolerates atorvastatin 80 mg  well without any side effects  Intolerance to Zetia - diarrhea  Discussed next potential options -PCSK-9 inhibitors; cost, dosing efficacy, side effects    Plan: Continue taking current medications (Lipitor 80 mg daily) Received PA approval for Repatha 140 mg SQ Q14d, prescription sent to [facility] outpatient pharmacy  Lipid lab due in 2-3 months after starting [facility]

## 2022-09-28 NOTE — Telephone Encounter (Signed)
Pharmacy Patient Advocate Encounter   Received notification from Physician's Office that prior authorization for REPATHA is required/requested.   Insurance verification completed.   The patient is insured through [employer] .   Per test claim: PA required; PA submitted to [employer] via CoverMyMeds Key/confirmation #/EOC [id number] Status is pending

## 2022-09-28 NOTE — Patient Instructions (Signed)
   Medication changes: start using Repatha and continue taking Lipitor 80 mg daily    Repatha is a cholesterol medication that improved your body's ability to get rid of "bad cholesterol" known as LDL. It can lower your LDL up to 60%! It is an injection that is given under the skin every 2 weeks. The medication often requires a prior authorization from your insurance company. We will take care of submitting all the necessary information to your insurance company to get it approved. The most common side effects of Repatha include runny nose, symptoms of the common cold, rarely flu or flu-like symptoms, back/muscle pain in about 3-4% of the patients, and redness, pain, or bruising at the injection site.   Lab orders: We want to repeat labs after 2-3 months.  We will send you a lab order to remind you once we get closer to that time.

## 2022-09-28 NOTE — Patient Instructions (Addendum)
Medication Instructions:  Your physician has recommended you make the following change in your medication:  1.) decrease losartan 25 mg to HALF TABLET (12.5 mg) DAILY  *If you need a refill on your cardiac medications before your next appointment, please call your pharmacy*   Lab Work: none   Testing/Procedures: none   Follow-Up: At [facility], you and your health needs are our priority.  As part of our continuing mission to provide you with exceptional heart care, we have created designated Provider Care Teams.  These Care Teams include your primary Cardiologist (physician) and Advanced Practice Providers (APPs -  Physician Assistants and Nurse Practitioners) who all work together to provide you with the care you need, when you need it.   Your next appointment:   6 month(s)  Provider:   Jari Favre, PA-C, Ronie Spies, PA-C, Robin Searing, NP, Jacolyn Reedy, PA-C, Eligha Bridegroom, NP, Tereso Newcomer, PA-C, or Perlie Gold, PA-C

## 2022-09-28 NOTE — Telephone Encounter (Signed)
PA for PCSK9i requested

## 2022-09-28 NOTE — Telephone Encounter (Signed)
Pharmacy Patient Advocate Encounter  Received notification from [employer] that Prior Authorization for REPATHA has been APPROVED from 09/28/22 to 09/28/23. Ran test claim, Copay is $25. This test claim was processed through [facility]- copay amounts may vary at other pharmacies due to pharmacy/plan contracts, or as the patient moves through the different stages of their insurance plan.

## 2022-09-28 NOTE — Telephone Encounter (Signed)
PA request has been Submitted. New Encounter created for follow up. For additional info see Pharmacy Prior Auth telephone encounter from 09/28/22.

## 2022-10-02 ENCOUNTER — Other Ambulatory Visit (HOSPITAL_COMMUNITY): Payer: Self-pay

## 2022-10-03 ENCOUNTER — Other Ambulatory Visit (HOSPITAL_COMMUNITY): Payer: Self-pay

## 2022-10-09 ENCOUNTER — Other Ambulatory Visit: Payer: Self-pay

## 2022-10-09 DIAGNOSIS — I5031 Acute diastolic (congestive) heart failure: Secondary | ICD-10-CM

## 2022-10-09 MED ORDER — FUROSEMIDE 40 MG PO TABS: 40.0000 mg | ORAL_TABLET | Freq: Every day | ORAL | 11 refills | Status: DC

## 2022-10-10 ENCOUNTER — Other Ambulatory Visit (HOSPITAL_COMMUNITY): Payer: Self-pay

## 2022-10-11 ENCOUNTER — Other Ambulatory Visit (HOSPITAL_COMMUNITY): Payer: Self-pay

## 2022-10-11 ENCOUNTER — Other Ambulatory Visit: Payer: Self-pay

## 2022-11-05 ENCOUNTER — Other Ambulatory Visit: Payer: Self-pay

## 2022-11-05 ENCOUNTER — Telehealth: Payer: Self-pay | Admitting: Internal Medicine

## 2022-11-05 MED ORDER — RANOLAZINE ER 500 MG PO TB12: 500.0000 mg | ORAL_TABLET | Freq: Two times a day (BID) | ORAL | 3 refills | Status: DC

## 2022-11-05 MED ORDER — POTASSIUM CHLORIDE CRYS ER 10 MEQ PO TBCR: 10.0000 meq | EXTENDED_RELEASE_TABLET | Freq: Every day | ORAL | 3 refills | Status: DC

## 2022-11-05 NOTE — Telephone Encounter (Signed)
Pt's medication was sent to pt's pharmacy as requested. Confirmation received.  °

## 2022-11-05 NOTE — Telephone Encounter (Signed)
*  STAT* If patient is at the pharmacy, call can be transferred to refill team.   1. Which medications need to be refilled? (please list name of each medication and dose if known) Ranolazine   2. Would you like to learn more about the convenience, safety, & potential cost savings by using the [facility]?    3. Are you open to using the [facility] (Type [facility].    4. Which pharmacy/location (including street and city if local pharmacy) is medication to be sent to?[address] [place]   5. Do they need a 30 day or 90 day supply? 30 days and refills- please call today-out of medicine

## 2022-12-03 NOTE — Progress Notes (Signed)
This encounter was created in error - please disregard.

## 2022-12-04 ENCOUNTER — Telehealth: Payer: Self-pay | Admitting: Internal Medicine

## 2022-12-04 MED ORDER — EMPAGLIFLOZIN 10 MG PO TABS: 10.0000 mg | ORAL_TABLET | Freq: Every day | ORAL | 3 refills | Status: DC

## 2022-12-04 NOTE — Telephone Encounter (Addendum)
Per Dr. Lynnette Caffey, discontinued Terry Nelson and sent in prescription for Jardiance 10 mg daily.  Called the patient and informed him of the change.  Pt voices understanding.

## 2022-12-04 NOTE — Telephone Encounter (Signed)
Pt c/o medication issue:  1. Name of Medication:   dapagliflozin propanediol (FARXIGA) 10 MG TABS tablet   2. How are you currently taking this medication (dosage and times per day)?  As prescribed  3. Are you having a reaction (difficulty breathing--STAT)?   4. What is your medication issue?   Caller Raynelle Fanning) stated patient's insurance will no longer cover this medication and will cover Jardiance 25 mg.  Caller wants to know if patient could switch to the [place] and she has already submitted a prior authorization for patient's Farxiga.

## 2022-12-04 NOTE — Telephone Encounter (Signed)
Spoke with Raynelle Fanning and she states patient's insurance no longer covers farxiga. They are requesting for patient to start jardiance,  Is it ok to switch to Jardiance?

## 2022-12-11 ENCOUNTER — Encounter: Payer: Self-pay | Admitting: Pharmacist

## 2022-12-13 ENCOUNTER — Telehealth: Payer: Self-pay | Admitting: Cardiology

## 2022-12-13 NOTE — Telephone Encounter (Signed)
Received outpatient page requesting callback regarding HR. Attempted to callback, VM left to return call if needed.

## 2022-12-13 NOTE — Telephone Encounter (Signed)
Spoke to patient, patient got new insurance and Repatha was cost prohibitive so no longer on it. Offered to enroll him in [facility], patient is not ready to add any more medications to his current medications(Lipitor 80 mg daily). He has f/u appointment with APP at [facility] in early March will discuss then.

## 2022-12-14 ENCOUNTER — Telehealth: Payer: Self-pay | Admitting: Internal Medicine

## 2022-12-14 NOTE — Telephone Encounter (Signed)
Spoke with patient and he states last night at [facility] his heart was beating really fast HR  was from 120-160. He also experienced SOB. Denied any chest pain. He stated it happened twice last night . This morning HR at 71. No SOB or chest pain. He is not sure what triggered it and he states he happened suddenly. He states he feel fine currently.    Did advise if it does occur again to call 911 and go to ED. He only takes baby aspirin. He will monitor HR for now.

## 2022-12-14 NOTE — Telephone Encounter (Signed)
Patient called in to see say it feels like his heart is skipping a beat. Please advise

## 2022-12-26 ENCOUNTER — Other Ambulatory Visit (HOSPITAL_COMMUNITY): Payer: Self-pay

## 2022-12-30 NOTE — Progress Notes (Deleted)
Cardiology Office Note:   Date:  12/30/2022  ID:  Terry Nelson, DOB 10-May-1955, MRN [id number] PCP:  Jiles Harold, PA-C  [facility] Providers Cardiologist:  Alverda Skeans, MD Referring MD: Jiles Harold, PA-C  Chief Complaint/Reason for Referral: Cardiology follow-up ASSESSMENT:    1. Palpitations   2. Dyspnea, unspecified type   3. Coronary artery disease involving native coronary artery of native heart with angina pectoris (HCC)   4. Type 2 diabetes mellitus with complication, with long-term current use of insulin (HCC)   5. Hypertension associated with diabetes (HCC)   6. Hyperlipidemia associated with type 2 diabetes mellitus (HCC)   7. Diastolic dysfunction   8. BMI 22.0-22.9, adult     PLAN:   In order of problems listed above: Palpitations: Will obtain reflex TSH, CMP, CBC, monitor, and echocardiogram. Dyspnea: We will obtain echocardiogram to evaluate further.  Pulse dose Lasix with 40 mg twice daily for 3 days then back to 40 mg daily given diastolic dysfunction with LVH on prior echocardiogram. Coronary artery disease: On indefinite aspirin and prasugrel due to complex coronary artery disease status.*** Type 2 diabetes mellitus: Continue aspirin, atorvastatin, losartan, and Jardiance. Hypertension:*** Hyperlipidemia: Check lipid panel and LFTs today.  Continue Repatha and high-dose atorvastatin. Diastolic dysfunction: Pulse dose Lasix as above; continue Jardiance and losartan.  Will check echocardiogram as detailed above. BMI 22: Will have patient meet with pharmacy to evaluate whether he would be a good candidate for GLP-1 receptor agonist therapy to decrease future risk of myocardial infarction given his history of diabetes.***       {Are you ordering a CV Procedure (e.g. stress test, cath, DCCV, TEE, etc)?   Press F2        :[id number]}   Dispo:  No follow-ups on file.      Medication Adjustments/Labs and Tests Ordered: Current medicines are reviewed at  length with the patient today.  Concerns regarding medicines are outlined above.  The following changes have been made:  {PLAN; NO CHANGE:13088:s}   Labs/tests ordered: No orders of the defined types were placed in this encounter.   Medication Changes: No orders of the defined types were placed in this encounter.   Current medicines are reviewed at length with the patient today.  The patient {ACTIONS; HAS/DOES NOT HAVE:19233} concerns regarding medicines.  I spent *** minutes reviewing all clinical data during and prior to this visit including all relevant imaging studies, laboratories, clinical information from other health systems and prior notes from both Cardiology and other specialties, interviewing the patient, conducting a complete physical examination, and coordinating care in order to formulate a comprehensive and personalized evaluation and treatment plan.   History of Present Illness:      FOCUSED PROBLEM LIST:   CAD LIMA to LAD, vein graft to OM1-OM2 2002 LIMA to LAD occluded Multiple PCI due to ACS Full metal jacket proximal to distal LAD with multiple layers of stents and under dilation Scoring balloon angioplasty July 2024 Type 2 diabetes On insulin Hypertension Hyperlipidemia On Repatha  LDL goal less than 55 LP(a) 29 Intolerant of Zetia BMI 22 Diastolic dysfunction Mild LVH TTE 2023  March 2024:  The patient is a 67 y.o. male with the indicated medical history here for routine cardiology follow-up.  The patient was seen last by Dr. Katrinka Blazing around 6 months ago.  He had previously done cutting balloon angioplasty due to ongoing symptoms.  The patient developed an episode of severe chest pain following the procedure however at his  last visit with Dr. Katrinka Blazing was doing relatively well.   He is to undergo lumbar laminectomy with general anesthesia in the coming weeks.  He is doing well.  He denies any exertional angina with his activities of daily living.  His  breathing is also well.  He denies any significant bleeding or bruising.  He is required no hospitalizations or emergency room visit room visits.  He has not had any severe episodes of chest pain like the one that occurred several months ago.  He is otherwise well without complaints.  Plan: Continue current therapy.   September 2024: In the interim the patient contacted our office due to chest pain.  He was seen by Dr. Clifton James in July for a DOD visit and referred for coronary angiography.  He had a scoring balloon angioplasty of his LAD extensively.  He was seen in follow-up and due to an inability to obtain an LDL of less than 55 Zetia was started.  He felt diarrhea in relation to Zetia so this was stopped.  The patient will be seeing pharmacy for PCSK9 inhibitor therapy.   The patient has been doing well from a chest pain perspective.  He is caring for his wife most of the time.  He is able to do what he needs to do in a day without much by way of chest pain.  He has noticed low blood pressures at home with pressures in the 100 systolic.  His biggest issue today honestly is back pain.  He will be seeing a provider regarding this next week.  Right now he does not know if surgery is in his near future or not.  Plan: Decrease losartan to 12.5 mg given low blood pressure; refer to pharmacy for lipid management.  12/24: In the interim the patient contacted our office in regards to shortness of breath and palpitations with no chest pain.          Current Medications: No outpatient medications have been marked as taking for the 12/31/22 encounter (Appointment) with [name], MD.     Review of Systems:   Please see the history of present illness.    All other systems reviewed and are negative.     EKGs/Labs/Other Test Reviewed:   EKG:    EKG Interpretation Date/Time:    Ventricular Rate:    PR Interval:    QRS Duration:    QT Interval:    QTC Calculation:   R Axis:      Text  Interpretation:           Risk Assessment/Calculations:   {Does this patient have ATRIAL FIBRILLATION?:[phone]}      Physical Exam:   VS:  There were no vitals taken for this visit.   No BP recorded.  {Refresh Note OR Click here to enter BP  :1}***   Wt Readings from Last 3 Encounters:  09/28/22 146 lb (66.2 kg)  08/13/22 146 lb 3.2 oz (66.3 kg)  07/31/22 142 lb 4.8 oz (64.5 kg)      GENERAL:  No apparent distress, AOx3 HEENT:  No carotid bruits, +2 carotid impulses, no scleral icterus CAR: RRR Irregular RR*** no murmurs***, gallops, rubs, or thrills RES:  Clear to auscultation bilaterally ABD:  Soft, nontender, nondistended, positive bowel sounds x 4 VASC:  +2 radial pulses, +2 carotid pulses NEURO:  CN 2-12 grossly intact; motor and sensory grossly intact PSYCH:  No active depression or anxiety EXT:  No edema, ecchymosis, or cyanosis  Signed,  [name], MD  12/30/2022 1:51 PM    [facility] [address] [place], [place], [place]  [address] Phone: [phone]; Fax: [phone]   Note:  This document was prepared using Dragon voice recognition software and may include unintentional dictation errors.

## 2022-12-31 ENCOUNTER — Ambulatory Visit: Payer: Medicare Other | Admitting: Internal Medicine

## 2022-12-31 DIAGNOSIS — I25119 Atherosclerotic heart disease of native coronary artery with unspecified angina pectoris: Secondary | ICD-10-CM

## 2022-12-31 DIAGNOSIS — E118 Type 2 diabetes mellitus with unspecified complications: Secondary | ICD-10-CM

## 2022-12-31 DIAGNOSIS — I5189 Other ill-defined heart diseases: Secondary | ICD-10-CM

## 2022-12-31 DIAGNOSIS — E1169 Type 2 diabetes mellitus with other specified complication: Secondary | ICD-10-CM

## 2022-12-31 DIAGNOSIS — E1159 Type 2 diabetes mellitus with other circulatory complications: Secondary | ICD-10-CM

## 2022-12-31 DIAGNOSIS — Z6822 Body mass index (BMI) 22.0-22.9, adult: Secondary | ICD-10-CM

## 2022-12-31 DIAGNOSIS — R0600 Dyspnea, unspecified: Secondary | ICD-10-CM

## 2022-12-31 DIAGNOSIS — R002 Palpitations: Secondary | ICD-10-CM

## 2023-01-01 ENCOUNTER — Encounter: Payer: Self-pay | Admitting: Cardiology

## 2023-01-01 ENCOUNTER — Ambulatory Visit: Payer: 59 | Attending: Cardiology | Admitting: Cardiology

## 2023-01-01 VITALS — BP 138/72 | HR 58 | Ht 67.0 in | Wt 141.2 lb

## 2023-01-01 DIAGNOSIS — I480 Paroxysmal atrial fibrillation: Secondary | ICD-10-CM

## 2023-01-01 DIAGNOSIS — I200 Unstable angina: Secondary | ICD-10-CM

## 2023-01-01 DIAGNOSIS — E785 Hyperlipidemia, unspecified: Secondary | ICD-10-CM | POA: Diagnosis not present

## 2023-01-01 DIAGNOSIS — I25700 Atherosclerosis of coronary artery bypass graft(s), unspecified, with unstable angina pectoris: Secondary | ICD-10-CM

## 2023-01-01 DIAGNOSIS — I10 Essential (primary) hypertension: Secondary | ICD-10-CM

## 2023-01-01 MED ORDER — AMLODIPINE BESYLATE 5 MG PO TABS: 5.0000 mg | ORAL_TABLET | Freq: Every day | ORAL | 3 refills | Status: DC

## 2023-01-01 NOTE — H&P (View-Only) (Signed)
 Cardiology Office Note:  .   Date:  01/01/2023  ID:  Terry Nelson, DOB 1955/05/09, MRN [id number] PCP: Terry Harold, PA-C  [facility] Providers Cardiologist:  Terry Pyo, MD    History of Present Illness: .   Terry Nelson is a 67 y.o. male with a past medical history of CAD s/p CABG and multiple PCI's with full metal jacket from the proximal to distal LAD, PAF, hypertension, dyslipidemia, migraines, DM2.  07/31/2022 left heart cath severe three-vessel CAD s/p three-vessel CABG with 2 out of 3 patent grafts, scoring balloon angioplasty, with recommendations to continue current medical therapy 06/02/2021 left heart cath angioplasty to distal LAD lesion 1, angioplasty to LAD lesion 2 02/02/2021 echo EF 60 to 65%, mild concentric LVH, grade 3 DD, LA severely dilated, mild MR, aortic valve sclerosis was present without stenosis 09/30/2020 left heart cath relatively unchanged burden of disease with extensive stenting of the LAD recommendations for medical therapy 09/06/2020 left heart cath and balloon angioplasty with recurrent diffuse in-stent restenosis to distal overlapping full metal jacket 06/12/2020 monitor predominantly normal sinus rhythm, no atrial fibrillation or sustained arrhythmias 12/16/2019 cardiac cath no further interventions 2002 CABG x 3 LIMA to LAD, SVG to OM1 and OM 2  Most recently evaluated by Dr. Lynnette Nelson on 09/28/2022, he was stable from a cardiac perspective, blood pressure was on the low side and his losartan dose was decreased.  He was advised to follow-up in 6 months.  He presents today for follow-up of chest pain.  He states his chest pain has been worsening with frequency and duration, he is " chewing nitroglycerin nonstop".  Currently, he is without chest pain.  He has chest pain he also has associated nausea, typically he has been taking 2 nitroglycerin and then the symptoms will subside-it is difficult for him to quantify how long the symptoms are spaced  apart.  Denies shortness of breath, palpitations, dizziness, syncope, edema, early CAD.  ROS: Review of Systems  Cardiovascular:  Positive for chest pain.  Gastrointestinal:  Positive for nausea.  All other systems reviewed and are negative.    Studies Reviewed: Marland Kitchen   EKG Interpretation Date/Time:  Tuesday January 01 2023 09:04:53 EST Ventricular Rate:  58 PR Interval:  124 QRS Duration:  80 QT Interval:  408 QTC Calculation: 400 R Axis:   38  Text Interpretation: Sinus bradycardia -- no changes ST & T wave abnormality, consider lateral ischemia Abnormal ECG When compared with ECG of 13-Aug-2022 14:52, No significant change was found Confirmed by Terry Nelson [phone] on 01/01/2023 8:04:55 AM    Cardiac Studies & Procedures   CARDIAC CATHETERIZATION  CARDIAC CATHETERIZATION 07/31/2022  Narrative   Ost LAD to Prox LAD lesion is 40% stenosed.   Prox LAD to Mid LAD lesion is 40% stenosed with 40% stenosed side branch in 2nd Diag.   Dist RCA lesion is 20% stenosed.   1st Mrg lesion is 90% stenosed.   2nd Mrg lesion is 25% stenosed.   RPDA lesion is 20% stenosed.   Mid RCA to Dist RCA lesion is 60% stenosed.   Dist LAD-3 lesion is 90% stenosed.   Dist LAD-2 lesion is 99% stenosed.   Dist LAD-1 lesion is 99% stenosed.   Scoring balloon angioplasty was performed using a BALLN SCOREFLEX 2.75X15.   Post intervention, there is a 50% residual stenosis.   Post intervention, there is a 50% residual stenosis.   LIMA graft was not injected.   SVG graft was  visualized by angiography and is normal in caliber.  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 Moderate ostial/proximal LAD stenosis that does not appear to be flow limiting. Multiple layers of stent in the proximal to distal LAD with severe mid to distal stented segment restenosis. Beyond the distal edge of the LAD stent the apical LAD becomes very small and is diffusely diseased. LIMA to mid LAD is known to be  atretic and was not visualized. Successful PTCA with scoring balloon angioplasty of the mid and distal LAD stented segment. The Circumflex is patent. Severe stenosis ostium of the first obtuse marginal branch. Patent sequential SVG to OM1 and OM2. Native RCA with mild diffuse proximal and mid vessel stenosis, moderate distal vessel stenosis and patent distal RCA stent into the small caliber PDA Normal LV filling pressure  Recommendations: Continue current medical therapy. Monitor overnight post groin sheath pull and PCI. He has no good options for more definitive treatment of his recurrent stent restenosis in the entire LAD. There are multiple layers of stents and these are under expanded in several areas. Prior attempts at Shockwave lithotripsy in 2021 not successful.  Findings Coronary Findings Diagnostic  Dominance: Right  Left Anterior Descending Ost LAD to Prox LAD lesion is 40% stenosed. Prox LAD to Mid LAD lesion is 40% stenosed with 40% stenosed side branch in 2nd Diag. The lesion was previously treated . Dist LAD-1 lesion is 99% stenosed. Vessel is the culprit lesion. The lesion is type C. The lesion was previously treated . Dist LAD-2 lesion is 99% stenosed. Vessel is the culprit lesion. The lesion is tubular. The lesion was previously treated . Dist LAD-3 lesion is 90% stenosed.  Left Circumflex Vessel is small.  First Obtuse Marginal Branch 1st Mrg lesion is 90% stenosed.  Second Obtuse Marginal Branch 2nd Mrg lesion is 25% stenosed.  Third Obtuse Marginal Branch Vessel is small in size.  Right Coronary Artery There is moderate diffuse disease throughout the vessel. Mid RCA to Dist RCA lesion is 60% stenosed. Dist RCA lesion is 20% stenosed. The lesion was previously treated .  Right Posterior Descending Artery There is moderate disease in the vessel. RPDA lesion is 20% stenosed. The lesion was previously treated .  First Right Posterolateral Branch Vessel is  small in size.  Sequential Saphenous Graft To 1st Mrg, 2nd Mrg SVG graft was visualized by angiography and is normal in caliber.  LIMA LIMA Graft To Mid LAD LIMA graft was not injected.  Known to be atretic Mid Graft to Insertion lesion is 100% stenosed.  Intervention  Dist LAD-1 lesion Angioplasty (Also treats lesions: Dist LAD-2) CATH VISTA GUIDE 6FR XBLAD3.5 guide catheter was inserted. WIRE ASAHI PROWATER 180CM guidewire used to cross lesion. Scoring balloon angioplasty was performed using a BALLN SCOREFLEX 2.75X15. Post-Intervention Lesion Assessment The intervention was successful. Pre-interventional TIMI flow is 2. Post-intervention TIMI flow is 3. No complications occurred at this lesion. There is a 50% residual stenosis post intervention.  Dist LAD-2 lesion Angioplasty (Also treats lesions: Dist LAD-1) See details in Dist LAD-1 lesion. Post-Intervention Lesion Assessment The intervention was successful. Pre-interventional TIMI flow is 2. Post-intervention TIMI flow is 3. No complications occurred at this lesion. There is a 50% residual stenosis post intervention.   CARDIAC CATHETERIZATION  CARDIAC CATHETERIZATION 06/02/2021  Narrative CONCLUSIONS: Diffuse in-stent restenosis in the distal third of the LAD for mild jacket with 95% stenosis reduced to 75% using the score flex cutting balloon, 15 x 2.5 mm  in diameter.  Maintained TIMI grade III flow. Proximal to ostial LAD 50 to 70% with RFR 0.96. Circumflex is patent. Sequential saphenous vein graft to obtuse marginals is patent. Native right coronary contains distal eccentric 70% stenosis and widely patent distal RCA to PDA stent Left main is short and widely patent Mild mid to apical anterior wall hypokinesis.  EF 55% with normal LVEDP.  RECOMMENDATIONS:  Continue risk factor modification. Continue anti-ischemic therapy. Pull and whole sheath this evening with discharge in a.m. is anticipated. Continue long-term  aspirin and Effient.  Findings Coronary Findings Diagnostic  Dominance: Right  Left Anterior Descending Ost LAD to Prox LAD lesion is 40% stenosed. Prox LAD to Mid LAD lesion is 40% stenosed with 40% stenosed side branch in 2nd Diag. The lesion was previously treated . Dist LAD-1 lesion is 95% stenosed. Vessel is the culprit lesion. The lesion is type C. The lesion was previously treated . Dist LAD-2 lesion is 95% stenosed. Vessel is the culprit lesion. The lesion is tubular. The lesion was previously treated . Dist LAD-3 lesion is 50% stenosed.  Left Circumflex Vessel is small.  First Obtuse Marginal Branch 1st Mrg lesion is 90% stenosed.  Second Obtuse Marginal Branch 2nd Mrg lesion is 25% stenosed.  Third Obtuse Marginal Branch Vessel is small in size.  Right Coronary Artery There is moderate diffuse disease throughout the vessel. Mid RCA to Dist RCA lesion is 70% stenosed. Dist RCA lesion is 20% stenosed. The lesion was previously treated .  Right Posterior Descending Artery There is moderate disease in the vessel. RPDA lesion is 20% stenosed. The lesion was previously treated .  First Right Posterolateral Branch Vessel is small in size.  Sequential Graft To 1st Mrg, 2nd Mrg  LIMA Graft To Mid LAD And is small. Mid Graft to Insertion lesion is 100% stenosed.  Intervention  Dist LAD-1 lesion Angioplasty Scoring balloon angioplasty was performed. Angioplasty (Also treats lesions: Dist LAD-2) Post-Intervention Lesion Assessment The intervention was successful. Pre-interventional TIMI flow is 3. Post-intervention TIMI flow is 3. There is a 75% residual stenosis post intervention.  Dist LAD-2 lesion Angioplasty (Also treats lesions: Dist LAD-1) Post-Intervention Lesion Assessment The intervention was successful. Pre-interventional TIMI flow is 3. Post-intervention TIMI flow is 3. There is a 75% residual stenosis post intervention.     ECHOCARDIOGRAM  ECHOCARDIOGRAM COMPLETE 02/02/2021  Narrative ECHOCARDIOGRAM REPORT    Patient Name:   Terry Nelson Date of Exam: 02/02/2021 Medical Rec #:  [id number]     Height:       66.0 in Accession #:    [id number]    Weight:       146.0 lb Date of Birth:  06/19/1955     BSA:          1.749 m Patient Age:    66 years      BP:           112/73 mmHg Patient Gender: M             HR:           63 bpm. Exam Location:  Inpatient  Procedure: 2D Echo  Indications:    Dyspnea  History:        Patient has prior history of Echocardiogram examinations, most recent 09/30/2020. CAD; Risk Factors:Hypertension.  Sonographer:    Eduard Roux Referring Phys: [id number] CHRISTOPHER RONALD BERGE  IMPRESSIONS   1. Left ventricular ejection fraction, by estimation, is 60 to 65%. The left ventricle has normal  function. The left ventricle has no regional wall motion abnormalities. There is mild left ventricular hypertrophy of the basal-septal segment. Left ventricular diastolic parameters are consistent with Grade III diastolic dysfunction (restrictive). Elevated left ventricular end-diastolic pressure. 2. Right ventricular systolic function is normal. The right ventricular size is normal. Tricuspid regurgitation signal is inadequate for assessing PA pressure. 3. Left atrial size was severely dilated. 4. The mitral valve is degenerative. Mild mitral valve regurgitation. No evidence of mitral stenosis. 5. The aortic valve is tricuspid. Aortic valve regurgitation is not visualized. Aortic valve sclerosis is present, with no evidence of aortic valve stenosis. Aortic valve Vmax measures 1.12 m/s. 6. The inferior vena cava is dilated in size with <50% respiratory variability, suggesting right atrial pressure of 15 mmHg.  FINDINGS Left Ventricle: Left ventricular ejection fraction, by estimation, is 60 to 65%. The left ventricle has normal function. The left ventricle has no regional wall motion  abnormalities. The left ventricular internal cavity size was normal in size. There is mild left ventricular hypertrophy of the basal-septal segment. Left ventricular diastolic parameters are consistent with Grade III diastolic dysfunction (restrictive). Elevated left ventricular end-diastolic pressure.  Right Ventricle: The right ventricular size is normal. No increase in right ventricular wall thickness. Right ventricular systolic function is normal. Tricuspid regurgitation signal is inadequate for assessing PA pressure.  Left Atrium: Left atrial size was severely dilated.  Right Atrium: Right atrial size was normal in size.  Pericardium: There is no evidence of pericardial effusion.  Mitral Valve: The mitral valve is degenerative in appearance. There is mild thickening of the mitral valve leaflet(s). There is mild calcification of the mitral valve leaflet(s). Mild mitral valve regurgitation. No evidence of mitral valve stenosis.  Tricuspid Valve: The tricuspid valve is normal in structure. Tricuspid valve regurgitation is trivial. No evidence of tricuspid stenosis.  Aortic Valve: The aortic valve is tricuspid. Aortic valve regurgitation is not visualized. Aortic valve sclerosis is present, with no evidence of aortic valve stenosis. Aortic valve peak gradient measures 5.0 mmHg.  Pulmonic Valve: The pulmonic valve was normal in structure. Pulmonic valve regurgitation is not visualized. No evidence of pulmonic stenosis.  Aorta: The aortic root is normal in size and structure.  Venous: The inferior vena cava is dilated in size with less than 50% respiratory variability, suggesting right atrial pressure of 15 mmHg.  IAS/Shunts: No atrial level shunt detected by color flow Doppler.   LEFT VENTRICLE PLAX 2D LVIDd:         4.50 cm   Diastology LVIDs:         2.90 cm   LV e' medial:    4.33 cm/s LV PW:         1.10 cm   LV E/e' medial:  21.4 LV IVS:        1.10 cm   LV e' lateral:   6.98  cm/s LVOT diam:     2.00 cm   LV E/e' lateral: 13.3 LV SV:         68 LV SV Index:   39 LVOT Area:     3.14 cm   RIGHT VENTRICLE            IVC RV Basal diam:  2.70 cm    IVC diam: 2.40 cm RV S prime:     8.72 cm/s TAPSE (M-mode): 1.6 cm  LEFT ATRIUM             Index        RIGHT  ATRIUM           Index LA diam:        5.00 cm 2.86 cm/m   RA Area:     15.30 cm LA Vol (A2C):   90.4 ml 51.67 ml/m  RA Volume:   38.70 ml  22.12 ml/m LA Vol (A4C):   84.5 ml 48.30 ml/m LA Biplane Vol: 88.2 ml 50.42 ml/m AORTIC VALVE                 PULMONIC VALVE AV Area (Vmax): 2.47 cm     PV Vmax:       0.75 m/s AV Vmax:        111.50 cm/s  PV Peak grad:  2.3 mmHg AV Peak Grad:   5.0 mmHg LVOT Vmax:      87.50 cm/s LVOT Vmean:     55.500 cm/s LVOT VTI:       0.216 m  AORTA Ao Root diam: 3.50 cm Ao Asc diam:  3.30 cm  MITRAL VALVE MV Area (PHT): 3.60 cm    SHUNTS MV Decel Time: 211 msec    Systemic VTI:  0.22 m MV E velocity: 92.50 cm/s  Systemic Diam: 2.00 cm MV A velocity: 29.10 cm/s MV E/A ratio:  3.18  Armanda Magic MD Electronically signed by Armanda Magic MD Signature Date/Time: 02/02/2021/9:17:55 AM    Final   MONITORS  CARDIAC EVENT MONITOR 06/01/2020  Narrative  Normal sinus rhythm  Rare 4 beat VT salvo  SVT salvos up to 20 beats  No atrial fibrillation or sustained arrhythmia.           Risk Assessment/Calculations:    CHA2DS2-VASc Score = 4   This indicates a 4.8% annual risk of stroke. The patient's score is based upon: CHF History: 0 HTN History: 1 Diabetes History: 1 Stroke History: 0 Vascular Disease History: 1 Age Score: 1 Gender Score: 0            Physical Exam:   VS:  BP 138/72   Pulse (!) 58   Ht 5\' 7"  (1.702 m)   Wt 141 lb 3.2 oz (64 kg)   SpO2 99%   BMI 22.12 kg/m    Wt Readings from Last 3 Encounters:  01/01/23 141 lb 3.2 oz (64 kg)  09/28/22 146 lb (66.2 kg)  08/13/22 146 lb 3.2 oz (66.3 kg)    GEN: Well nourished, well  developed in no acute distress NECK: No JVD; No carotid bruits CARDIAC: RRR, no murmurs, rubs, gallops RESPIRATORY:  Clear to auscultation without rales, wheezing or rhonchi  ABDOMEN: Soft, non-tender, non-distended EXTREMITIES:  No edema; No deformity   ASSESSMENT AND PLAN: .   Chest pain with known coronary artery disease-he is currently chest pain-free, his episodes of chest pain are reminiscent with what he previously experienced when he has needed interventions (PCI, CABG).  EKG today without any changes.  We discussed  options for medical optimization including increasing his Ranexa--he declined states higher doses caused headaches, and starting amlodipine. Continue Effient 10 mg daily, aspirin 81 mg daily, continue isosorbide monitor 120 mg daily, continue nitroglycerin as needed, continue metoprolol 200 mg daily, continue Repatha.  Will add amlodipine 5 mg daily to see if this helps with any of his symptoms.  Discussed with DOD Dr. Bing Matter, he visited patient at the bedside, discussed his significant CAD, multiple interventions, and his increasing chest pain symptoms, the patient would like to proceed with a cardiac catheterization.  Risk and benefits  were explained, he is well versed and has had several PCI's--questions answered to his satisfaction.  Will arrange for left heart catheterization.  In the interim, he was advised to report to the ED if symptoms persist, worsen, or not relieved by 3 nitroglycerin--he verbalizes understanding.  Check CBC BMET.  Hypertension-blood pressure is 138/72, at his last OV with Dr. Lynnette Nelson he was bothered by hypotension and his losartan dose had been decreased.  Continue losartan 12.5 mg daily, restarting amlodipine per above to see if it will help alleviate chest pain.  PAF-he is maintaining sinus rhythm, CHA2DS2-VASc score of 4, he is not currently on a DOAC, this was not addressed at this OV.  Dyslipidemia-most recent LDL on 04/05/2022 was 61,  Repatha.  DM2-currently on Tresiba.    Informed Consent   Shared Decision Making/Informed Consent The risks [stroke (1 in 1000), death (1 in 1000), kidney failure [usually temporary] (1 in 500), bleeding (1 in 200), allergic reaction [possibly serious] (1 in 200)], benefits (diagnostic support and management of coronary artery disease) and alternatives of a cardiac catheterization were discussed in detail with Mr. Giannini and he is willing to proceed.     Dispo: Start amlodipine 5 mg daily, CBC, BMET, left heart cath >> follow-up with primary cardiologist.  Signed, Flossie Dibble, NP

## 2023-01-01 NOTE — Progress Notes (Signed)
Cardiology Office Note:  .   Date:  01/01/2023  ID:  Terry Nelson, DOB 1955/05/09, MRN [id number] PCP: Jiles Harold, PA-C  [facility] Providers Cardiologist:  [name], MD    History of Present Illness: .   Terry Nelson is a 67 y.o. male with a past medical history of CAD s/p CABG and multiple PCI's with full metal jacket from the proximal to distal LAD, PAF, hypertension, dyslipidemia, migraines, DM2.  07/31/2022 left heart cath severe three-vessel CAD s/p three-vessel CABG with 2 out of 3 patent grafts, scoring balloon angioplasty, with recommendations to continue current medical therapy 06/02/2021 left heart cath angioplasty to distal LAD lesion 1, angioplasty to LAD lesion 2 02/02/2021 echo EF 60 to 65%, mild concentric LVH, grade 3 DD, LA severely dilated, mild MR, aortic valve sclerosis was present without stenosis 09/30/2020 left heart cath relatively unchanged burden of disease with extensive stenting of the LAD recommendations for medical therapy 09/06/2020 left heart cath and balloon angioplasty with recurrent diffuse in-stent restenosis to distal overlapping full metal jacket 06/12/2020 monitor predominantly normal sinus rhythm, no atrial fibrillation or sustained arrhythmias 12/16/2019 cardiac cath no further interventions 2002 CABG x 3 LIMA to LAD, SVG to OM1 and OM 2  Most recently evaluated by Dr. Lynnette Caffey on 09/28/2022, he was stable from a cardiac perspective, blood pressure was on the low side and his losartan dose was decreased.  He was advised to follow-up in 6 months.  He presents today for follow-up of chest pain.  He states his chest pain has been worsening with frequency and duration, he is " chewing nitroglycerin nonstop".  Currently, he is without chest pain.  He has chest pain he also has associated nausea, typically he has been taking 2 nitroglycerin and then the symptoms will subside-it is difficult for him to quantify how long the symptoms are spaced  apart.  Denies shortness of breath, palpitations, dizziness, syncope, edema, early CAD.  ROS: Review of Systems  Cardiovascular:  Positive for chest pain.  Gastrointestinal:  Positive for nausea.  All other systems reviewed and are negative.    Studies Reviewed: Marland Kitchen   EKG Interpretation Date/Time:  Tuesday January 01 2023 09:04:53 EST Ventricular Rate:  58 PR Interval:  124 QRS Duration:  80 QT Interval:  408 QTC Calculation: 400 R Axis:   38  Text Interpretation: Sinus bradycardia -- no changes ST & T wave abnormality, consider lateral ischemia Abnormal ECG When compared with ECG of 13-Aug-2022 14:52, No significant change was found Confirmed by Wallis Bamberg [phone] on 01/01/2023 8:04:55 AM    Cardiac Studies & Procedures   CARDIAC CATHETERIZATION  CARDIAC CATHETERIZATION 07/31/2022  Narrative   Ost LAD to Prox LAD lesion is 40% stenosed.   Prox LAD to Mid LAD lesion is 40% stenosed with 40% stenosed side branch in 2nd Diag.   Dist RCA lesion is 20% stenosed.   1st Mrg lesion is 90% stenosed.   2nd Mrg lesion is 25% stenosed.   RPDA lesion is 20% stenosed.   Mid RCA to Dist RCA lesion is 60% stenosed.   Dist LAD-3 lesion is 90% stenosed.   Dist LAD-2 lesion is 99% stenosed.   Dist LAD-1 lesion is 99% stenosed.   Scoring balloon angioplasty was performed using a BALLN SCOREFLEX 2.75X15.   Post intervention, there is a 50% residual stenosis.   Post intervention, there is a 50% residual stenosis.   LIMA graft was not injected.   SVG graft was  visualized by angiography and is normal in caliber.  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 Moderate ostial/proximal LAD stenosis that does not appear to be flow limiting. Multiple layers of stent in the proximal to distal LAD with severe mid to distal stented segment restenosis. Beyond the distal edge of the LAD stent the apical LAD becomes very small and is diffusely diseased. LIMA to mid LAD is known to be  atretic and was not visualized. Successful PTCA with scoring balloon angioplasty of the mid and distal LAD stented segment. The Circumflex is patent. Severe stenosis ostium of the first obtuse marginal branch. Patent sequential SVG to OM1 and OM2. Native RCA with mild diffuse proximal and mid vessel stenosis, moderate distal vessel stenosis and patent distal RCA stent into the small caliber PDA Normal LV filling pressure  Recommendations: Continue current medical therapy. Monitor overnight post groin sheath pull and PCI. He has no good options for more definitive treatment of his recurrent stent restenosis in the entire LAD. There are multiple layers of stents and these are under expanded in several areas. Prior attempts at Shockwave lithotripsy in 2021 not successful.  Findings Coronary Findings Diagnostic  Dominance: Right  Left Anterior Descending Ost LAD to Prox LAD lesion is 40% stenosed. Prox LAD to Mid LAD lesion is 40% stenosed with 40% stenosed side branch in 2nd Diag. The lesion was previously treated . Dist LAD-1 lesion is 99% stenosed. Vessel is the culprit lesion. The lesion is type C. The lesion was previously treated . Dist LAD-2 lesion is 99% stenosed. Vessel is the culprit lesion. The lesion is tubular. The lesion was previously treated . Dist LAD-3 lesion is 90% stenosed.  Left Circumflex Vessel is small.  First Obtuse Marginal Branch 1st Mrg lesion is 90% stenosed.  Second Obtuse Marginal Branch 2nd Mrg lesion is 25% stenosed.  Third Obtuse Marginal Branch Vessel is small in size.  Right Coronary Artery There is moderate diffuse disease throughout the vessel. Mid RCA to Dist RCA lesion is 60% stenosed. Dist RCA lesion is 20% stenosed. The lesion was previously treated .  Right Posterior Descending Artery There is moderate disease in the vessel. RPDA lesion is 20% stenosed. The lesion was previously treated .  First Right Posterolateral Branch Vessel is  small in size.  Sequential Saphenous Graft To 1st Mrg, 2nd Mrg SVG graft was visualized by angiography and is normal in caliber.  LIMA LIMA Graft To Mid LAD LIMA graft was not injected.  Known to be atretic Mid Graft to Insertion lesion is 100% stenosed.  Intervention  Dist LAD-1 lesion Angioplasty (Also treats lesions: Dist LAD-2) CATH VISTA GUIDE 6FR XBLAD3.5 guide catheter was inserted. WIRE ASAHI PROWATER 180CM guidewire used to cross lesion. Scoring balloon angioplasty was performed using a BALLN SCOREFLEX 2.75X15. Post-Intervention Lesion Assessment The intervention was successful. Pre-interventional TIMI flow is 2. Post-intervention TIMI flow is 3. No complications occurred at this lesion. There is a 50% residual stenosis post intervention.  Dist LAD-2 lesion Angioplasty (Also treats lesions: Dist LAD-1) See details in Dist LAD-1 lesion. Post-Intervention Lesion Assessment The intervention was successful. Pre-interventional TIMI flow is 2. Post-intervention TIMI flow is 3. No complications occurred at this lesion. There is a 50% residual stenosis post intervention.   CARDIAC CATHETERIZATION  CARDIAC CATHETERIZATION 06/02/2021  Narrative CONCLUSIONS: Diffuse in-stent restenosis in the distal third of the LAD for mild jacket with 95% stenosis reduced to 75% using the score flex cutting balloon, 15 x 2.5 mm  in diameter.  Maintained TIMI grade III flow. Proximal to ostial LAD 50 to 70% with RFR 0.96. Circumflex is patent. Sequential saphenous vein graft to obtuse marginals is patent. Native right coronary contains distal eccentric 70% stenosis and widely patent distal RCA to PDA stent Left main is short and widely patent Mild mid to apical anterior wall hypokinesis.  EF 55% with normal LVEDP.  RECOMMENDATIONS:  Continue risk factor modification. Continue anti-ischemic therapy. Pull and whole sheath this evening with discharge in a.m. is anticipated. Continue long-term  aspirin and Effient.  Findings Coronary Findings Diagnostic  Dominance: Right  Left Anterior Descending Ost LAD to Prox LAD lesion is 40% stenosed. Prox LAD to Mid LAD lesion is 40% stenosed with 40% stenosed side branch in 2nd Diag. The lesion was previously treated . Dist LAD-1 lesion is 95% stenosed. Vessel is the culprit lesion. The lesion is type C. The lesion was previously treated . Dist LAD-2 lesion is 95% stenosed. Vessel is the culprit lesion. The lesion is tubular. The lesion was previously treated . Dist LAD-3 lesion is 50% stenosed.  Left Circumflex Vessel is small.  First Obtuse Marginal Branch 1st Mrg lesion is 90% stenosed.  Second Obtuse Marginal Branch 2nd Mrg lesion is 25% stenosed.  Third Obtuse Marginal Branch Vessel is small in size.  Right Coronary Artery There is moderate diffuse disease throughout the vessel. Mid RCA to Dist RCA lesion is 70% stenosed. Dist RCA lesion is 20% stenosed. The lesion was previously treated .  Right Posterior Descending Artery There is moderate disease in the vessel. RPDA lesion is 20% stenosed. The lesion was previously treated .  First Right Posterolateral Branch Vessel is small in size.  Sequential Graft To 1st Mrg, 2nd Mrg  LIMA Graft To Mid LAD And is small. Mid Graft to Insertion lesion is 100% stenosed.  Intervention  Dist LAD-1 lesion Angioplasty Scoring balloon angioplasty was performed. Angioplasty (Also treats lesions: Dist LAD-2) Post-Intervention Lesion Assessment The intervention was successful. Pre-interventional TIMI flow is 3. Post-intervention TIMI flow is 3. There is a 75% residual stenosis post intervention.  Dist LAD-2 lesion Angioplasty (Also treats lesions: Dist LAD-1) Post-Intervention Lesion Assessment The intervention was successful. Pre-interventional TIMI flow is 3. Post-intervention TIMI flow is 3. There is a 75% residual stenosis post intervention.     ECHOCARDIOGRAM  ECHOCARDIOGRAM COMPLETE 02/02/2021  Narrative ECHOCARDIOGRAM REPORT    Patient Name:   Terry Nelson Date of Exam: 02/02/2021 Medical Rec #:  [id number]     Height:       66.0 in Accession #:    [id number]    Weight:       146.0 lb Date of Birth:  06/19/1955     BSA:          1.749 m Patient Age:    66 years      BP:           112/73 mmHg Patient Gender: M             HR:           63 bpm. Exam Location:  Inpatient  Procedure: 2D Echo  Indications:    Dyspnea  History:        Patient has prior history of Echocardiogram examinations, most recent 09/30/2020. CAD; Risk Factors:Hypertension.  Sonographer:    Eduard Roux Referring Phys: [id number] CHRISTOPHER RONALD BERGE  IMPRESSIONS   1. Left ventricular ejection fraction, by estimation, is 60 to 65%. The left ventricle has normal  function. The left ventricle has no regional wall motion abnormalities. There is mild left ventricular hypertrophy of the basal-septal segment. Left ventricular diastolic parameters are consistent with Grade III diastolic dysfunction (restrictive). Elevated left ventricular end-diastolic pressure. 2. Right ventricular systolic function is normal. The right ventricular size is normal. Tricuspid regurgitation signal is inadequate for assessing PA pressure. 3. Left atrial size was severely dilated. 4. The mitral valve is degenerative. Mild mitral valve regurgitation. No evidence of mitral stenosis. 5. The aortic valve is tricuspid. Aortic valve regurgitation is not visualized. Aortic valve sclerosis is present, with no evidence of aortic valve stenosis. Aortic valve Vmax measures 1.12 m/s. 6. The inferior vena cava is dilated in size with <50% respiratory variability, suggesting right atrial pressure of 15 mmHg.  FINDINGS Left Ventricle: Left ventricular ejection fraction, by estimation, is 60 to 65%. The left ventricle has normal function. The left ventricle has no regional wall motion  abnormalities. The left ventricular internal cavity size was normal in size. There is mild left ventricular hypertrophy of the basal-septal segment. Left ventricular diastolic parameters are consistent with Grade III diastolic dysfunction (restrictive). Elevated left ventricular end-diastolic pressure.  Right Ventricle: The right ventricular size is normal. No increase in right ventricular wall thickness. Right ventricular systolic function is normal. Tricuspid regurgitation signal is inadequate for assessing PA pressure.  Left Atrium: Left atrial size was severely dilated.  Right Atrium: Right atrial size was normal in size.  Pericardium: There is no evidence of pericardial effusion.  Mitral Valve: The mitral valve is degenerative in appearance. There is mild thickening of the mitral valve leaflet(s). There is mild calcification of the mitral valve leaflet(s). Mild mitral valve regurgitation. No evidence of mitral valve stenosis.  Tricuspid Valve: The tricuspid valve is normal in structure. Tricuspid valve regurgitation is trivial. No evidence of tricuspid stenosis.  Aortic Valve: The aortic valve is tricuspid. Aortic valve regurgitation is not visualized. Aortic valve sclerosis is present, with no evidence of aortic valve stenosis. Aortic valve peak gradient measures 5.0 mmHg.  Pulmonic Valve: The pulmonic valve was normal in structure. Pulmonic valve regurgitation is not visualized. No evidence of pulmonic stenosis.  Aorta: The aortic root is normal in size and structure.  Venous: The inferior vena cava is dilated in size with less than 50% respiratory variability, suggesting right atrial pressure of 15 mmHg.  IAS/Shunts: No atrial level shunt detected by color flow Doppler.   LEFT VENTRICLE PLAX 2D LVIDd:         4.50 cm   Diastology LVIDs:         2.90 cm   LV e' medial:    4.33 cm/s LV PW:         1.10 cm   LV E/e' medial:  21.4 LV IVS:        1.10 cm   LV e' lateral:   6.98  cm/s LVOT diam:     2.00 cm   LV E/e' lateral: 13.3 LV SV:         68 LV SV Index:   39 LVOT Area:     3.14 cm   RIGHT VENTRICLE            IVC RV Basal diam:  2.70 cm    IVC diam: 2.40 cm RV S prime:     8.72 cm/s TAPSE (M-mode): 1.6 cm  LEFT ATRIUM             Index        RIGHT  ATRIUM           Index LA diam:        5.00 cm 2.86 cm/m   RA Area:     15.30 cm LA Vol (A2C):   90.4 ml 51.67 ml/m  RA Volume:   38.70 ml  22.12 ml/m LA Vol (A4C):   84.5 ml 48.30 ml/m LA Biplane Vol: 88.2 ml 50.42 ml/m AORTIC VALVE                 PULMONIC VALVE AV Area (Vmax): 2.47 cm     PV Vmax:       0.75 m/s AV Vmax:        111.50 cm/s  PV Peak grad:  2.3 mmHg AV Peak Grad:   5.0 mmHg LVOT Vmax:      87.50 cm/s LVOT Vmean:     55.500 cm/s LVOT VTI:       0.216 m  AORTA Ao Root diam: 3.50 cm Ao Asc diam:  3.30 cm  MITRAL VALVE MV Area (PHT): 3.60 cm    SHUNTS MV Decel Time: 211 msec    Systemic VTI:  0.22 m MV E velocity: 92.50 cm/s  Systemic Diam: 2.00 cm MV A velocity: 29.10 cm/s MV E/A ratio:  3.18  Armanda Magic MD Electronically signed by Armanda Magic MD Signature Date/Time: 02/02/2021/9:17:55 AM    Final   MONITORS  CARDIAC EVENT MONITOR 06/01/2020  Narrative  Normal sinus rhythm  Rare 4 beat VT salvo  SVT salvos up to 20 beats  No atrial fibrillation or sustained arrhythmia.           Risk Assessment/Calculations:    CHA2DS2-VASc Score = 4   This indicates a 4.8% annual risk of stroke. The patient's score is based upon: CHF History: 0 HTN History: 1 Diabetes History: 1 Stroke History: 0 Vascular Disease History: 1 Age Score: 1 Gender Score: 0            Physical Exam:   VS:  BP 138/72   Pulse (!) 58   Ht 5\' 7"  (1.702 m)   Wt 141 lb 3.2 oz (64 kg)   SpO2 99%   BMI 22.12 kg/m    Wt Readings from Last 3 Encounters:  01/01/23 141 lb 3.2 oz (64 kg)  09/28/22 146 lb (66.2 kg)  08/13/22 146 lb 3.2 oz (66.3 kg)    GEN: Well nourished, well  developed in no acute distress NECK: No JVD; No carotid bruits CARDIAC: RRR, no murmurs, rubs, gallops RESPIRATORY:  Clear to auscultation without rales, wheezing or rhonchi  ABDOMEN: Soft, non-tender, non-distended EXTREMITIES:  No edema; No deformity   ASSESSMENT AND PLAN: .   Chest pain with known coronary artery disease-he is currently chest pain-free, his episodes of chest pain are reminiscent with what he previously experienced when he has needed interventions (PCI, CABG).  EKG today without any changes.  We discussed  options for medical optimization including increasing his Ranexa--he declined states higher doses caused headaches, and starting amlodipine. Continue Effient 10 mg daily, aspirin 81 mg daily, continue isosorbide monitor 120 mg daily, continue nitroglycerin as needed, continue metoprolol 200 mg daily, continue Repatha.  Will add amlodipine 5 mg daily to see if this helps with any of his symptoms.  Discussed with DOD Dr. Bing Matter, he visited patient at the bedside, discussed his significant CAD, multiple interventions, and his increasing chest pain symptoms, the patient would like to proceed with a cardiac catheterization.  Risk and benefits  were explained, he is well versed and has had several PCI's--questions answered to his satisfaction.  Will arrange for left heart catheterization.  In the interim, he was advised to report to the ED if symptoms persist, worsen, or not relieved by 3 nitroglycerin--he verbalizes understanding.  Check CBC BMET.  Hypertension-blood pressure is 138/72, at his last OV with Dr. Lynnette Caffey he was bothered by hypotension and his losartan dose had been decreased.  Continue losartan 12.5 mg daily, restarting amlodipine per above to see if it will help alleviate chest pain.  PAF-he is maintaining sinus rhythm, CHA2DS2-VASc score of 4, he is not currently on a DOAC, this was not addressed at this OV.  Dyslipidemia-most recent LDL on 04/05/2022 was 61,  Repatha.  DM2-currently on Tresiba.    Informed Consent   Shared Decision Making/Informed Consent The risks [stroke (1 in 1000), death (1 in 1000), kidney failure [usually temporary] (1 in 500), bleeding (1 in 200), allergic reaction [possibly serious] (1 in 200)], benefits (diagnostic support and management of coronary artery disease) and alternatives of a cardiac catheterization were discussed in detail with Mr. Giannini and he is willing to proceed.     Dispo: Start amlodipine 5 mg daily, CBC, BMET, left heart cath >> follow-up with primary cardiologist.  Signed, Flossie Dibble, NP

## 2023-01-01 NOTE — Patient Instructions (Addendum)
  [facility] A DEPT OF [facility] [place] [address] [place] [address] Dept: [phone] Loc: [phone]  Terry Nelson  01/01/2023  You are scheduled for a Cardiac Catheterization on Monday, December 30 with Dr. Verne Carrow.  1. Please arrive at the [facility] (Main Entrance A) at [facility]: [address] [place], [place] [address] at 5:30 AM (This time is 2 hour(s) before your procedure to ensure your preparation).   Free valet parking service is available. You will check in at ADMITTING. The support person will be asked to wait in the waiting room.  It is OK to have someone drop you off and come back when you are ready to be discharged.    Special note: Every effort is made to have your procedure done on time. Please understand that emergencies sometimes delay scheduled procedures.  2. Diet: Do not eat solid foods after midnight.  The patient may have clear liquids until 5am upon the day of the procedure.  3. Labs: You will need to have blood drawn today. You will have a Cmp and CBC 4. Medication instructions in preparation for your procedure:    Do not take Lasix the morning of your procedure    Take only half  of your insulin the night before your procedure. Do not take any insulin on the day of the procedure.  Do not take Diabetes Med Jardiance on the day of the procedure and HOLD 48 HOURS AFTER THE PROCEDURE. On the morning of your procedure, take your Aspirin 81 mg and any morning medicines NOT listed above.  You may use sips of water.  5. Plan to go home the same day, you will only stay overnight if medically necessary. 6. Bring a current list of your medications and current insurance cards. 7. You MUST have a responsible person to drive you home. 8. Someone MUST be with you the first 24 hours after you arrive home or your discharge will be delayed. 9. Please wear clothes that are  easy to get on and off and wear slip-on shoes.  Thank you for allowing [place] to care for you!   -- [facility]

## 2023-01-02 LAB — CBC WITH DIFFERENTIAL/PLATELET
Basophils Absolute: 0 x10E3/uL (ref 0.0–0.2)
Basos: 1 %
EOS (ABSOLUTE): 0.1 x10E3/uL (ref 0.0–0.4)
Eos: 1 %
Hematocrit: 44.4 % (ref 37.5–51.0)
Hemoglobin: 14.9 g/dL (ref 13.0–17.7)
Immature Grans (Abs): 0 x10E3/uL (ref 0.0–0.1)
Immature Granulocytes: 0 %
Lymphocytes Absolute: 1.6 x10E3/uL (ref 0.7–3.1)
Lymphs: 23 %
MCH: 34.4 pg — ABNORMAL HIGH (ref 26.6–33.0)
MCHC: 33.6 g/dL (ref 31.5–35.7)
MCV: 103 fL — ABNORMAL HIGH (ref 79–97)
Monocytes Absolute: 0.7 x10E3/uL (ref 0.1–0.9)
Monocytes: 10 %
Neutrophils Absolute: 4.7 x10E3/uL (ref 1.4–7.0)
Neutrophils: 65 %
Platelets: 160 x10E3/uL (ref 150–450)
RBC: 4.33 x10E6/uL (ref 4.14–5.80)
RDW: 13.6 % (ref 11.6–15.4)
WBC: 7.2 x10E3/uL (ref 3.4–10.8)

## 2023-01-02 LAB — COMPREHENSIVE METABOLIC PANEL WITH GFR
ALT: 12 IU/L (ref 0–44)
AST: 21 IU/L (ref 0–40)
Albumin: 4.9 g/dL (ref 3.9–4.9)
Alkaline Phosphatase: 86 IU/L (ref 44–121)
BUN/Creatinine Ratio: 15 (ref 10–24)
BUN: 19 mg/dL (ref 8–27)
Bilirubin Total: 0.6 mg/dL (ref 0.0–1.2)
CO2: 25 mmol/L (ref 20–29)
Calcium: 10.3 mg/dL — ABNORMAL HIGH (ref 8.6–10.2)
Chloride: 99 mmol/L (ref 96–106)
Creatinine, Ser: 1.28 mg/dL — ABNORMAL HIGH (ref 0.76–1.27)
Globulin, Total: 2.4 g/dL (ref 1.5–4.5)
Glucose: 155 mg/dL — ABNORMAL HIGH (ref 70–99)
Potassium: 4.8 mmol/L (ref 3.5–5.2)
Sodium: 141 mmol/L (ref 134–144)
Total Protein: 7.3 g/dL (ref 6.0–8.5)
eGFR: 62 mL/min/1.73

## 2023-01-07 ENCOUNTER — Ambulatory Visit (HOSPITAL_COMMUNITY)
Admission: RE | Disposition: A | Discharge status: Home / Self Care | Payer: Self-pay | Source: Home / Self Care | Attending: Cardiovascular Disease

## 2023-01-07 ENCOUNTER — Other Ambulatory Visit: Payer: Self-pay

## 2023-01-07 ENCOUNTER — Ambulatory Visit (HOSPITAL_COMMUNITY)
Admission: RE | Admit: 2023-01-07 | Discharge: 2023-01-08 | Disposition: A | Discharge status: Home / Self Care | Payer: 59 | Attending: Cardiovascular Disease | Admitting: Cardiovascular Disease

## 2023-01-07 DIAGNOSIS — Z7902 Long term (current) use of antithrombotics/antiplatelets: Secondary | ICD-10-CM | POA: Diagnosis not present

## 2023-01-07 DIAGNOSIS — I25110 Atherosclerotic heart disease of native coronary artery with unstable angina pectoris: Secondary | ICD-10-CM | POA: Diagnosis not present

## 2023-01-07 DIAGNOSIS — E785 Hyperlipidemia, unspecified: Secondary | ICD-10-CM | POA: Diagnosis not present

## 2023-01-07 DIAGNOSIS — Z951 Presence of aortocoronary bypass graft: Secondary | ICD-10-CM | POA: Insufficient documentation

## 2023-01-07 DIAGNOSIS — I2582 Chronic total occlusion of coronary artery: Secondary | ICD-10-CM | POA: Insufficient documentation

## 2023-01-07 DIAGNOSIS — N189 Chronic kidney disease, unspecified: Secondary | ICD-10-CM | POA: Diagnosis not present

## 2023-01-07 DIAGNOSIS — I200 Unstable angina: Secondary | ICD-10-CM | POA: Diagnosis present

## 2023-01-07 DIAGNOSIS — Z794 Long term (current) use of insulin: Secondary | ICD-10-CM | POA: Diagnosis not present

## 2023-01-07 DIAGNOSIS — Z955 Presence of coronary angioplasty implant and graft: Secondary | ICD-10-CM | POA: Insufficient documentation

## 2023-01-07 DIAGNOSIS — E1122 Type 2 diabetes mellitus with diabetic chronic kidney disease: Secondary | ICD-10-CM | POA: Diagnosis not present

## 2023-01-07 DIAGNOSIS — Z79899 Other long term (current) drug therapy: Secondary | ICD-10-CM | POA: Diagnosis not present

## 2023-01-07 DIAGNOSIS — I5032 Chronic diastolic (congestive) heart failure: Secondary | ICD-10-CM | POA: Insufficient documentation

## 2023-01-07 DIAGNOSIS — Z7982 Long term (current) use of aspirin: Secondary | ICD-10-CM | POA: Insufficient documentation

## 2023-01-07 DIAGNOSIS — I25700 Atherosclerosis of coronary artery bypass graft(s), unspecified, with unstable angina pectoris: Secondary | ICD-10-CM

## 2023-01-07 DIAGNOSIS — I480 Paroxysmal atrial fibrillation: Secondary | ICD-10-CM | POA: Insufficient documentation

## 2023-01-07 DIAGNOSIS — I10 Essential (primary) hypertension: Secondary | ICD-10-CM | POA: Diagnosis present

## 2023-01-07 DIAGNOSIS — I130 Hypertensive heart and chronic kidney disease with heart failure and stage 1 through stage 4 chronic kidney disease, or unspecified chronic kidney disease: Secondary | ICD-10-CM | POA: Insufficient documentation

## 2023-01-07 DIAGNOSIS — K219 Gastro-esophageal reflux disease without esophagitis: Secondary | ICD-10-CM | POA: Insufficient documentation

## 2023-01-07 HISTORY — PX: LEFT HEART CATH AND CORS/GRAFTS ANGIOGRAPHY: CATH118250

## 2023-01-07 LAB — GLUCOSE, CAPILLARY
Glucose-Capillary: 89 mg/dL (ref 70–99)
Glucose-Capillary: 76 mg/dL (ref 70–99)
Glucose-Capillary: 171 mg/dL — ABNORMAL HIGH (ref 70–99)
Glucose-Capillary: 150 mg/dL — ABNORMAL HIGH (ref 70–99)
Glucose-Capillary: 188 mg/dL — ABNORMAL HIGH (ref 70–99)

## 2023-01-07 SURGERY — LEFT HEART CATH AND CORS/GRAFTS ANGIOGRAPHY
Anesthesia: LOCAL

## 2023-01-07 MED ORDER — FENTANYL CITRATE (PF) 100 MCG/2ML IJ SOLN
INTRAMUSCULAR | Status: DC | PRN
  Administered 2023-01-07: 50 ug via INTRAVENOUS
  Administered 2023-01-07: 25 ug via INTRAVENOUS
  Administered 2023-01-07: 50 ug via INTRAVENOUS

## 2023-01-07 MED ORDER — PRASUGREL HCL 10 MG PO TABS
10.0000 mg | ORAL_TABLET | Freq: Every morning | ORAL | Status: DC
Start: 2023-01-08 — End: 2023-01-08
  Administered 2023-01-08: 10 mg via ORAL
  Filled 2023-01-07: qty 1

## 2023-01-07 MED ORDER — GABAPENTIN 400 MG PO CAPS
1200.0000 mg | ORAL_CAPSULE | Freq: Two times a day (BID) | ORAL | Status: DC
  Administered 2023-01-07 – 2023-01-08 (×3): 1200 mg via ORAL
  Filled 2023-01-07 (×3): qty 3

## 2023-01-07 MED ORDER — LIDOCAINE HCL (PF) 1 % IJ SOLN
INTRAMUSCULAR | Status: AC
Start: 2023-01-07 — End: ?
  Filled 2023-01-07: qty 30

## 2023-01-07 MED ORDER — HEPARIN (PORCINE) IN NACL 1000-0.9 UT/500ML-% IV SOLN
INTRAVENOUS | Status: DC | PRN
  Administered 2023-01-07 (×2): 500 mL

## 2023-01-07 MED ORDER — LABETALOL HCL 5 MG/ML IV SOLN
10.0000 mg | INTRAVENOUS | Status: AC | PRN
Start: 2023-01-07 — End: 2023-01-07

## 2023-01-07 MED ORDER — FENTANYL CITRATE (PF) 100 MCG/2ML IJ SOLN
INTRAMUSCULAR | Status: AC
  Filled 2023-01-07: qty 2

## 2023-01-07 MED ORDER — SODIUM CHLORIDE 0.9% FLUSH: 3.0000 mL | INTRAVENOUS | Status: DC | PRN

## 2023-01-07 MED ORDER — NITROGLYCERIN 0.4 MG SL SUBL
SUBLINGUAL_TABLET | SUBLINGUAL | Status: AC
  Administered 2023-01-07: 0.4 mg
  Filled 2023-01-07: qty 1

## 2023-01-07 MED ORDER — IOHEXOL 350 MG/ML SOLN
INTRAVENOUS | Status: DC | PRN
  Administered 2023-01-07: 55 mL via INTRA_ARTERIAL

## 2023-01-07 MED ORDER — AMLODIPINE BESYLATE 5 MG PO TABS
5.0000 mg | ORAL_TABLET | Freq: Every day | ORAL | Status: DC
  Administered 2023-01-07: 5 mg via ORAL

## 2023-01-07 MED ORDER — NITROGLYCERIN 0.4 MG SL SUBL: 0.4000 mg | SUBLINGUAL_TABLET | SUBLINGUAL | Status: DC | PRN

## 2023-01-07 MED ORDER — SODIUM CHLORIDE 0.9% FLUSH
3.0000 mL | Freq: Two times a day (BID) | INTRAVENOUS | Status: DC
  Administered 2023-01-07 – 2023-01-08 (×3): 3 mL via INTRAVENOUS

## 2023-01-07 MED ORDER — MIDAZOLAM HCL 5 MG/5ML IJ SOLN
INTRAMUSCULAR | Status: AC
  Filled 2023-01-07: qty 5

## 2023-01-07 MED ORDER — ONDANSETRON HCL 4 MG/2ML IJ SOLN: 4.0000 mg | Freq: Four times a day (QID) | INTRAMUSCULAR | Status: DC | PRN

## 2023-01-07 MED ORDER — SODIUM CHLORIDE 0.9 % IV SOLN: INTRAVENOUS | Status: AC

## 2023-01-07 MED ORDER — OXYCODONE HCL 5 MG PO TABS: 5.0000 mg | ORAL_TABLET | ORAL | Status: DC | PRN

## 2023-01-07 MED ORDER — TRAZODONE HCL 50 MG PO TABS
50.0000 mg | ORAL_TABLET | Freq: Every evening | ORAL | Status: DC | PRN
  Administered 2023-01-07: 50 mg via ORAL
  Filled 2023-01-07: qty 1

## 2023-01-07 MED ORDER — PANTOPRAZOLE SODIUM 40 MG PO TBEC
40.0000 mg | DELAYED_RELEASE_TABLET | Freq: Every morning | ORAL | Status: DC
Start: 2023-01-08 — End: 2023-01-08
  Administered 2023-01-08: 40 mg via ORAL
  Filled 2023-01-07 (×2): qty 1

## 2023-01-07 MED ORDER — OXYCODONE-ACETAMINOPHEN 5-325 MG PO TABS
1.0000 | ORAL_TABLET | ORAL | Status: DC | PRN
  Administered 2023-01-07: 1 via ORAL
  Filled 2023-01-07: qty 1

## 2023-01-07 MED ORDER — HYDRALAZINE HCL 20 MG/ML IJ SOLN
10.0000 mg | INTRAMUSCULAR | Status: AC | PRN
Start: 2023-01-07 — End: 2023-01-07

## 2023-01-07 MED ORDER — POTASSIUM CHLORIDE CRYS ER 20 MEQ PO TBCR
EXTENDED_RELEASE_TABLET | ORAL | Status: AC
  Filled 2023-01-07: qty 1

## 2023-01-07 MED ORDER — POTASSIUM CHLORIDE CRYS ER 10 MEQ PO TBCR
10.0000 meq | EXTENDED_RELEASE_TABLET | Freq: Every day | ORAL | Status: DC
  Administered 2023-01-07 – 2023-01-08 (×2): 10 meq via ORAL
  Filled 2023-01-07: qty 1

## 2023-01-07 MED ORDER — INSULIN GLARGINE-YFGN 100 UNIT/ML ~~LOC~~ SOLN
22.0000 [IU] | Freq: Every day | SUBCUTANEOUS | Status: DC
  Administered 2023-01-07: 22 [IU] via SUBCUTANEOUS
  Filled 2023-01-07 (×2): qty 0.22

## 2023-01-07 MED ORDER — SODIUM CHLORIDE 0.9 % IV SOLN: 250.0000 mL | INTRAVENOUS | Status: AC | PRN

## 2023-01-07 MED ORDER — TIZANIDINE HCL 4 MG PO TABS
4.0000 mg | ORAL_TABLET | Freq: Three times a day (TID) | ORAL | Status: DC
  Administered 2023-01-08: 4 mg via ORAL
  Filled 2023-01-07 (×3): qty 1

## 2023-01-07 MED ORDER — ISOSORBIDE MONONITRATE ER 60 MG PO TB24
120.0000 mg | ORAL_TABLET | Freq: Every day | ORAL | Status: DC
  Administered 2023-01-07: 120 mg via ORAL
  Filled 2023-01-07: qty 2

## 2023-01-07 MED ORDER — PRASUGREL HCL 10 MG PO TABS
10.0000 mg | ORAL_TABLET | Freq: Once | ORAL | Status: AC
  Administered 2023-01-07: 10 mg via ORAL
  Filled 2023-01-07: qty 1

## 2023-01-07 MED ORDER — METOPROLOL SUCCINATE ER 100 MG PO TB24
200.0000 mg | ORAL_TABLET | Freq: Every day | ORAL | Status: DC
  Filled 2023-01-07: qty 1

## 2023-01-07 MED ORDER — ACETAMINOPHEN 325 MG PO TABS: 650.0000 mg | ORAL_TABLET | ORAL | Status: DC | PRN

## 2023-01-07 MED ORDER — EMPAGLIFLOZIN 10 MG PO TABS
10.0000 mg | ORAL_TABLET | Freq: Every day | ORAL | Status: DC
  Administered 2023-01-08: 10 mg via ORAL
  Filled 2023-01-07 (×2): qty 1

## 2023-01-07 MED ORDER — RANOLAZINE ER 500 MG PO TB12
1000.0000 mg | ORAL_TABLET | Freq: Two times a day (BID) | ORAL | Status: DC
  Administered 2023-01-07: 500 mg via ORAL
  Administered 2023-01-07 – 2023-01-08 (×2): 1000 mg via ORAL
  Filled 2023-01-07 (×3): qty 2

## 2023-01-07 MED ORDER — FUROSEMIDE 20 MG PO TABS
20.0000 mg | ORAL_TABLET | Freq: Every day | ORAL | Status: DC
  Administered 2023-01-07: 20 mg via ORAL
  Filled 2023-01-07: qty 1

## 2023-01-07 MED ORDER — TIZANIDINE HCL 4 MG PO TABS
8.0000 mg | ORAL_TABLET | Freq: Every day | ORAL | Status: DC
  Administered 2023-01-07: 8 mg via ORAL
  Filled 2023-01-07 (×2): qty 2

## 2023-01-07 MED ORDER — LOSARTAN POTASSIUM 25 MG PO TABS
12.5000 mg | ORAL_TABLET | Freq: Every day | ORAL | Status: DC
  Filled 2023-01-07: qty 0.5

## 2023-01-07 MED ORDER — INSULIN DEGLUDEC 100 UNIT/ML ~~LOC~~ SOPN: 28.0000 [IU] | PEN_INJECTOR | Freq: Every day | SUBCUTANEOUS | Status: DC

## 2023-01-07 MED ORDER — LIDOCAINE HCL (PF) 1 % IJ SOLN
INTRAMUSCULAR | Status: DC | PRN
  Administered 2023-01-07: 12 mL

## 2023-01-07 MED ORDER — SODIUM CHLORIDE 0.9 % WEIGHT BASED INFUSION: 1.0000 mL/kg/h | INTRAVENOUS | Status: DC

## 2023-01-07 MED ORDER — MIDAZOLAM HCL 2 MG/2ML IJ SOLN
INTRAMUSCULAR | Status: DC | PRN
  Administered 2023-01-07: 2 mg via INTRAVENOUS

## 2023-01-07 MED ORDER — ATORVASTATIN CALCIUM 80 MG PO TABS
80.0000 mg | ORAL_TABLET | Freq: Every day | ORAL | Status: DC
  Administered 2023-01-07: 80 mg via ORAL
  Filled 2023-01-07 (×2): qty 1

## 2023-01-07 MED ORDER — SPIRONOLACTONE 25 MG PO TABS
25.0000 mg | ORAL_TABLET | Freq: Every morning | ORAL | Status: DC
  Administered 2023-01-07: 25 mg via ORAL
  Filled 2023-01-07: qty 1

## 2023-01-07 MED ORDER — ASPIRIN 81 MG PO TBEC
81.0000 mg | DELAYED_RELEASE_TABLET | Freq: Every day | ORAL | Status: DC
  Administered 2023-01-08: 81 mg via ORAL
  Filled 2023-01-07 (×2): qty 1

## 2023-01-07 MED ORDER — ASPIRIN 81 MG PO CHEW
81.0000 mg | CHEWABLE_TABLET | ORAL | Status: AC
  Administered 2023-01-07: 81 mg via ORAL
  Filled 2023-01-07: qty 1

## 2023-01-07 MED ORDER — ASPIRIN 81 MG PO CHEW: 81.0000 mg | CHEWABLE_TABLET | ORAL | Status: DC

## 2023-01-07 MED ORDER — SODIUM CHLORIDE 0.9 % WEIGHT BASED INFUSION
3.0000 mL/kg/h | INTRAVENOUS | Status: DC
  Administered 2023-01-07: 3 mL/kg/h via INTRAVENOUS

## 2023-01-07 MED ORDER — AMLODIPINE BESYLATE 5 MG PO TABS
ORAL_TABLET | ORAL | Status: AC
  Filled 2023-01-07: qty 1

## 2023-01-07 MED ORDER — OXYCODONE-ACETAMINOPHEN 10-325 MG PO TABS: 1.0000 | ORAL_TABLET | ORAL | Status: DC | PRN

## 2023-01-07 SURGICAL SUPPLY — 11 items
CATH INFINITI 5FR MULTPACK ANG (CATHETERS) IMPLANT
CLOSURE MYNX CONTROL 5F (Vascular Products) IMPLANT
ELECT DEFIB PAD ADLT CADENCE (PAD) IMPLANT
KIT HEART LEFT (KITS) IMPLANT
KIT MICROPUNCTURE NIT STIFF (SHEATH) IMPLANT
PACK CARDIAC CATHETERIZATION (CUSTOM PROCEDURE TRAY) ×2 IMPLANT
SHEATH PINNACLE 5F 10CM (SHEATH) IMPLANT
SHEATH PROBE COVER 6X72 (BAG) IMPLANT
TRANSDUCER W/STOPCOCK (MISCELLANEOUS) IMPLANT
TUBING CIL FLEX 10 FLL-RA (TUBING) IMPLANT
WIRE EMERALD 3MM-J .035X150CM (WIRE) IMPLANT

## 2023-01-07 NOTE — Plan of Care (Signed)
  Problem: Education: Goal: Understanding of CV disease, CV risk reduction, and recovery process will improve Outcome: Progressing Goal: Individualized Educational Video(s) Outcome: Progressing   

## 2023-01-07 NOTE — Interval H&P Note (Signed)
History and Physical Interval Note:  01/07/2023 7:16 AM  Asencion Partridge Raider  has presented today for surgery, with the diagnosis of CAD.  The various methods of treatment have been discussed with the patient and family. After consideration of risks, benefits and other options for treatment, the patient has consented to  Procedure(s): LEFT HEART CATH AND CORS/GRAFTS ANGIOGRAPHY (N/A) as a surgical intervention.  The patient's history has been reviewed, patient examined, no change in status, stable for surgery.  I have reviewed the patient's chart and labs.  Questions were answered to the patient's satisfaction.    Cath Lab Visit (complete for each Cath Lab visit)  Clinical Evaluation Leading to the Procedure:   ACS: No.  Non-ACS:    Anginal Classification: CCS III  Anti-ischemic medical therapy: Maximal Therapy (2 or more classes of medications)  Non-Invasive Test Results: No non-invasive testing performed  Prior CABG: Previous CABG        Verne Carrow

## 2023-01-08 ENCOUNTER — Encounter (HOSPITAL_COMMUNITY): Payer: Self-pay | Admitting: Cardiovascular Disease

## 2023-01-08 ENCOUNTER — Telehealth: Payer: Self-pay | Admitting: Internal Medicine

## 2023-01-08 DIAGNOSIS — I200 Unstable angina: Secondary | ICD-10-CM

## 2023-01-08 DIAGNOSIS — I25110 Atherosclerotic heart disease of native coronary artery with unstable angina pectoris: Secondary | ICD-10-CM | POA: Diagnosis not present

## 2023-01-08 LAB — CBC
HCT: 41.2 % (ref 39.0–52.0)
Hemoglobin: 13.8 g/dL (ref 13.0–17.0)
MCH: 33.5 pg (ref 26.0–34.0)
MCHC: 33.5 g/dL (ref 30.0–36.0)
MCV: 100 fL (ref 80.0–100.0)
Platelets: 139 10*3/uL — ABNORMAL LOW (ref 150–400)
RBC: 4.12 MIL/uL — ABNORMAL LOW (ref 4.22–5.81)
RDW: 13.6 % (ref 11.5–15.5)
WBC: 9.9 10*3/uL (ref 4.0–10.5)
nRBC: 0 % (ref 0.0–0.2)

## 2023-01-08 LAB — BASIC METABOLIC PANEL
Anion gap: 9 (ref 5–15)
BUN: 22 mg/dL (ref 8–23)
CO2: 28 mmol/L (ref 22–32)
Calcium: 9.5 mg/dL (ref 8.9–10.3)
Chloride: 101 mmol/L (ref 98–111)
Creatinine, Ser: 1.21 mg/dL (ref 0.61–1.24)
GFR, Estimated: >60 mL/min (ref >60)
Glucose, Bld: 133 mg/dL — ABNORMAL HIGH (ref 70–99)
Potassium: 3.9 mmol/L (ref 3.5–5.1)
Sodium: 138 mmol/L (ref 135–145)

## 2023-01-08 LAB — GLUCOSE, CAPILLARY
Glucose-Capillary: 140 mg/dL — ABNORMAL HIGH (ref 70–99)
Glucose-Capillary: 138 mg/dL — ABNORMAL HIGH (ref 70–99)

## 2023-01-08 MED ORDER — RANOLAZINE ER 500 MG PO TB12: 1000.0000 mg | ORAL_TABLET | Freq: Two times a day (BID) | ORAL | 1 refills | Status: DC

## 2023-01-08 MED FILL — Midazolam HCl Inj 5 MG/5ML (Base Equivalent): INTRAMUSCULAR | Qty: 2 | Status: AC

## 2023-01-08 NOTE — Plan of Care (Signed)

## 2023-01-08 NOTE — Progress Notes (Signed)
 Discharge instructions provided by Marla Roe RN Patient discharged to home via wheelchair by SWOT RN at 1443 on 01/08/23 with all belongings.Marland Kitchen

## 2023-01-08 NOTE — Plan of Care (Signed)
  Problem: Education: Goal: Understanding of CV disease, CV risk reduction, and recovery process will improve Outcome: Progressing   Problem: Activity: Goal: Ability to return to baseline activity level will improve Outcome: Progressing   Problem: Cardiovascular: Goal: Ability to achieve and maintain adequate cardiovascular perfusion will improve Outcome: Progressing Goal: Vascular access site(s) Level 0-1 will be maintained Outcome: Progressing   Problem: Education: Goal: Knowledge of General Education information will improve Description: Including pain rating scale, medication(s)/side effects and non-pharmacologic comfort measures Outcome: Progressing   Problem: Clinical Measurements: Goal: Ability to maintain clinical measurements within normal limits will improve Outcome: Progressing Goal: Will remain free from infection Outcome: Progressing   Problem: Activity: Goal: Risk for activity intolerance will decrease Outcome: Progressing   Problem: Coping: Goal: Level of anxiety will decrease Outcome: Progressing   Problem: Pain Management: Goal: General experience of comfort will improve Outcome: Progressing   Problem: Safety: Goal: Ability to remain free from injury will improve Outcome: Progressing   Problem: Skin Integrity: Goal: Risk for impaired skin integrity will decrease Outcome: Progressing

## 2023-01-08 NOTE — Discharge Summary (Signed)
 Discharge Summary    Patient ID: Terry Nelson MRN: [id number]; DOB: 22-Nov-1955  Admit date: 01/07/2023 Discharge date: 01/08/2023  PCP:  Jacobo Poe, PA-C   [facility] Providers Cardiologist:  Lurena MARLA Red, MD      Discharge Diagnoses    Principal Problem:   Unstable angina [facility]) Active Problems:   Hypertension   Hyperlipidemia LDL goal <50  Diagnostic Studies/Procedures   Cath: 01/07/2023    Ost LAD to Prox LAD lesion is 40% stenosed.   Dist RCA lesion is 20% stenosed.   Mid RCA to Dist RCA lesion is 60% stenosed.   1st Mrg lesion is 90% stenosed.   2nd Mrg lesion is 25% stenosed.   RPDA lesion is 20% stenosed.   Dist LAD lesion is 100% stenosed.   Mid LAD lesion is 50% stenosed.   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  Moderate ostial/proximal LAD stenosis that does not appear to be flow limiting.  Multiple layers of stent in the proximal to distal LAD with new total occlusion of the entire distal LAD stented segment. This area has multiple layers of stent and has been treated multiple times with balloon angioplasty. LIMA to mid LAD is known to be atretic and was not visualized.  The Circumflex is patent. Severe stenosis ostium of the first obtuse marginal branch. Patent sequential SVG to OM1 and OM2.  Native RCA with mild diffuse proximal and mid vessel stenosis, moderate distal vessel stenosis and patent distal RCA stent into the small caliber PDA Normal LV systolic function Normal LV filling pressure   Recommendations: He has no good options for treatment of the chronic occlusion of the previously stented distal LAD. This vessel is known to be small and any attempts at restoring flow would likely not give him any long lasting benefit. Will continue aggressive anti-anginal therapy with Norvasc , Imdur , Toprol  and Ranexa . Will increase Ranexa  to 1000 mg BID. Given ongoing chest pain in the cath lab, will monitor overnight and  follow BP closely.   Diagnostic Dominance: Right  _____________   History of Present Illness     Terry Nelson is a 67 y.o. male with a past medical history of CAD s/p CABG and multiple PCI's with full metal jacket from the proximal to distal LAD, PAF, hypertension, dyslipidemia, migraines, DM2.   07/31/2022 left heart cath severe three-vessel CAD s/p three-vessel CABG with 2 out of 3 patent grafts, scoring balloon angioplasty, with recommendations to continue current medical therapy 06/02/2021 left heart cath angioplasty to distal LAD lesion 1, angioplasty to LAD lesion 2 02/02/2021 echo EF 60 to 65%, mild concentric LVH, grade 3 DD, LA severely dilated, mild MR, aortic valve sclerosis was present without stenosis 09/30/2020 left heart cath relatively unchanged burden of disease with extensive stenting of the LAD recommendations for medical therapy 09/06/2020 left heart cath and balloon angioplasty with recurrent diffuse in-stent restenosis to distal overlapping full metal jacket 06/12/2020 monitor predominantly normal sinus rhythm, no atrial fibrillation or sustained arrhythmias 12/16/2019 cardiac cath no further interventions 2002 CABG x 3 LIMA to LAD, SVG to OM1 and OM 2   Seen by Dr. Red on 09/28/2022, he was stable from a cardiac perspective, blood pressure was on the low side and his losartan  dose was decreased.  He was advised to follow-up in 6 months.   Presented to the clinic for follow-up of chest pain.  He stated his chest pain has been worsening with frequency and  duration, he was  chewing nitroglycerin  nonstop.  No chest pain at that office visit. He had chest pain he also has associated nausea, typically he has been taking 2 nitroglycerin  and then the symptoms will subside-it is difficult for him to quantify how long the symptoms are spaced apart.  Denied shortness of breath, palpitations, dizziness, syncope, edema, early CAD. Given his symptoms he was set up for outpatient cardiac  cath.   Hospital Course     Chest pain CAD status post CABG/multiple PCIs with full metal jacket to LAD -- Presented for outpatient cardiac catheterization noted above, known severe three-vessel CAD with 2/3 patent grafts.  Multiple layers of stent in the LAD with new total occlusion of entire distal LAD stented segment.  Given the area in multiple layers of stent had been treated multiple times with balloon angioplasty it was felt there were no good options for treatment at this time.  Given small vessel size any attempts to restore flow would not likely give him any long-lasting benefit.  Recommendations to continue aggressive antianginal therapy, Ranexa  increased to 1000 mg twice daily -- Continue aspirin , Effient   Hypertension -- Pressures were soft the morning of discharge.  He will be continued on Ranexa  1000 mg twice daily with recommendations to resume his metoprolol  XL at 100 mg daily along with losartan  12.5 mg daily if systolic blood pressure greater than 120 mmHg 1/1 -- Other blood pressure medications including amlodipine , spironolactone  held.  Imdur  stopped.  -- Consider resuming these as an outpatient if blood pressure stabilizes -- Plan for outpatient visit with Pharm.D. for BP check  Hyperlipidemia -- Continue Repatha   Paroxysmal atrial fibrillation -- Maintaining sinus rhythm, has not been on DOAC  Patient was seen by Dr. Elmira and deemed stable for discharge home.  Follow-up arranged in the office.  Instructions given regarding medication changes prior to discharge.  _____________  Discharge Vitals Blood pressure 111/66, pulse (!) 58, temperature 98.2 F (36.8 C), temperature source Oral, resp. rate 20, height 5' 7 (1.702 m), weight 61.1 kg, SpO2 100%.  Filed Weights   01/07/23 1454 01/08/23 0402  Weight: 61.2 kg 61.1 kg    Labs & Radiologic Studies    CBC Recent Labs    01/08/23 0259  WBC 9.9  HGB 13.8  HCT 41.2  MCV 100.0  PLT 139*   Basic  Metabolic Panel Recent Labs    87/68/75 0259  NA 138  K 3.9  CL 101  CO2 28  GLUCOSE 133*  BUN 22  CREATININE 1.21  CALCIUM  9.5   Liver Function Tests No results for input(s): AST, ALT, ALKPHOS, BILITOT, PROT, ALBUMIN in the last 72 hours. No results for input(s): LIPASE, AMYLASE in the last 72 hours. High Sensitivity Troponin:   No results for input(s): TROPONINIHS in the last 720 hours.  BNP Invalid input(s): POCBNP D-Dimer No results for input(s): DDIMER in the last 72 hours. Hemoglobin A1C No results for input(s): HGBA1C in the last 72 hours. Fasting Lipid Panel No results for input(s): CHOL, HDL, LDLCALC, TRIG, CHOLHDL, LDLDIRECT in the last 72 hours. Thyroid Function Tests No results for input(s): TSH, T4TOTAL, T3FREE, THYROIDAB in the last 72 hours.  Invalid input(s): FREET3 _____________  CARDIAC CATHETERIZATION Result Date: 01/07/2023   Ost LAD to Prox LAD lesion is 40% stenosed.   Dist RCA lesion is 20% stenosed.   Mid RCA to Dist RCA lesion is 60% stenosed.   1st Mrg lesion is 90% stenosed.   2nd Mrg lesion  is 25% stenosed.   RPDA lesion is 20% stenosed.   Dist LAD lesion is 100% stenosed.   Mid LAD lesion is 50% stenosed. 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 Moderate ostial/proximal LAD stenosis that does not appear to be flow limiting. Multiple layers of stent in the proximal to distal LAD with new total occlusion of the entire distal LAD stented segment. This area has multiple layers of stent and has been treated multiple times with balloon angioplasty. LIMA to mid LAD is known to be atretic and was not visualized. The Circumflex is patent. Severe stenosis ostium of the first obtuse marginal branch. Patent sequential SVG to OM1 and OM2. Native RCA with mild diffuse proximal and mid vessel stenosis, moderate distal vessel stenosis and patent distal RCA stent into the small caliber PDA Normal LV systolic  function Normal LV filling pressure  Recommendations: He has no good options for treatment of the chronic occlusion of the previously stented distal LAD. This vessel is known to be small and any attempts at restoring flow would likely not give him any long lasting benefit. Will continue aggressive anti-anginal therapy with Norvasc , Imdur , Toprol  and Ranexa . Will increase Ranexa  to 1000 mg BID. Given ongoing chest pain in the cath lab, will monitor overnight and follow BP closely.   Disposition   Pt is being discharged home today in good condition.  Follow-up Plans & Appointments     Follow-up Information     Jacobo Poe, PA-C Follow up.   Specialty: Physician Assistant Why: Please follow up in a week. Contact information: [facility] [address] [place] [address] [phone]                Discharge Instructions     Call MD for:  redness, tenderness, or signs of infection (pain, swelling, redness, odor or green/yellow discharge around incision site)   Complete by: As directed    Diet - low sodium heart healthy   Complete by: As directed    Discharge instructions   Complete by: As directed    Groin Site Care Refer to this sheet in the next few weeks. These instructions provide you with information on caring for yourself after your procedure. Your caregiver may also give you more specific instructions. Your treatment has been planned according to current medical practices, but problems sometimes occur. Call your caregiver if you have any problems or questions after your procedure. HOME CARE INSTRUCTIONS You may shower 24 hours after the procedure. Remove the bandage (dressing) and gently wash the site with plain soap and water. Gently pat the site dry.  Do not apply powder or lotion to the site.  Do not sit in a bathtub, swimming pool, or whirlpool for 5 to 7 days.  No bending, squatting, or lifting anything over 10 pounds (4.5 kg) as directed by your caregiver.  Inspect  the site at least twice daily.  Do not drive home if you are discharged the same day of the procedure. Have someone else drive you.  You may drive 24 hours after the procedure unless otherwise instructed by your caregiver.  What to expect: Any bruising will usually fade within 1 to 2 weeks.  Blood that collects in the tissue (hematoma) may be painful to the touch. It should usually decrease in size and tenderness within 1 to 2 weeks.  SEEK IMMEDIATE MEDICAL CARE IF: You have unusual pain at the groin site or down the affected leg.  You have redness,  warmth, swelling, or pain at the groin site.  You have drainage (other than a small amount of blood on the dressing).  You have chills.  You have a fever or persistent symptoms for more than 72 hours.  You have a fever and your symptoms suddenly get worse.  Your leg becomes pale, cool, tingly, or numb.  You have heavy bleeding from the site. Hold pressure on the site. SABRA  HOME CARE INSTRUCTIONS You may shower the day after the procedure. Remove the bandage (dressing) and gently wash the site with plain soap and water. Gently pat the site dry.  Do not apply powder or lotion to the site.  Do not submerge the affected site in water for 3 to 5 days.  Inspect the site at least twice daily.  Do not flex or bend the affected arm for 24 hours.  No lifting over 5 pounds (2.3 kg) for 5 days after your procedure.  Do not drive home if you are discharged the same day of the procedure. Have someone else drive you.  You may drive 24 hours after the procedure unless otherwise instructed by your caregiver.  What to expect: Any bruising will usually fade within 1 to 2 weeks.  Blood that collects in the tissue (hematoma) may be painful to the touch. It should usually decrease in size and tenderness within 1 to 2 weeks.  SEEK IMMEDIATE MEDICAL CARE IF: You have unusual pain at the radial site.  You have redness, warmth, swelling, or pain at the radial site.   You have drainage (other than a small amount of blood on the dressing).  You have chills.  You have a fever or persistent symptoms for more than 72 hours.  You have a fever and your symptoms suddenly get worse.  Your arm becomes pale, cool, tingly, or numb.  You have heavy bleeding from the site. Hold pressure on the site.   Increase activity slowly   Complete by: As directed         Discharge Medications   Allergies as of 01/08/2023       Reactions   Metoclopramide Other (See Comments)   DYSKINESIAS contractions of whole body   Zetia  [ezetimibe ] Diarrhea   Penicillins Swelling, Rash           Medication List     PAUSE taking these medications    losartan  25 MG tablet Wait to take this until: January 09, 2023 Morning Commonly known as: COZAAR  Take 0.5 tablets (12.5 mg total) by mouth daily. Notes to patient: Restart if blood pressures greater than 120   metoprolol  200 MG 24 hr tablet Wait to take this until: January 09, 2023 Morning Commonly known as: Toprol  XL Take 1 tablet (200 mg total) by mouth daily. Notes to patient: Please only take 100mg  (1/2 tab) of metoprolol  if BP greater than 120       STOP taking these medications    amLODipine  5 MG tablet Commonly known as: NORVASC    isosorbide  mononitrate 60 MG 24 hr tablet Commonly known as: IMDUR    spironolactone  25 MG tablet Commonly known as: ALDACTONE        TAKE these medications    aspirin  EC 81 MG tablet Take 1 tablet (81 mg total) by mouth daily. Swallow whole.   atorvastatin  80 MG tablet Commonly known as: LIPITOR  Take 80 mg by mouth at bedtime.   BD Pen Needle Nano U/F 32G X 4 MM Misc Generic drug: Insulin  Pen Needle USE ONCE  AS DIRECTED   Contour Next Monitor w/Device Kit daily.   Contour Next Test test strip Generic drug: glucose blood by Does not apply route.   empagliflozin  10 MG Tabs tablet Commonly known as: Jardiance  Take 1 tablet (10 mg total) by mouth daily before  breakfast.   FreeStyle Libre 3 Sensor Misc USE TO TEST BLOOD SUGAR AND CHANGE EVERY 14 DAYS AS DIRECTED   furosemide  40 MG tablet Commonly known as: LASIX  Take 1 tablet (40 mg total) by mouth daily. Take 40mg  (1 tablet) daily. Take additional 40mg  as needed for weight gain of 2 pounds overnight or 5 pounds in one week. What changed:  how much to take additional instructions   gabapentin  300 MG capsule Commonly known as: NEURONTIN  Take 1,200-1,500 mg by mouth See admin instructions. Takes 1200 mg in the morning and 1500 in the evening   magnesium  oxide 400 MG tablet Commonly known as: MAG-OX Take 800 mg by mouth every morning.   nitroGLYCERIN  0.4 MG SL tablet Commonly known as: NITROSTAT  Place 1 tablet (0.4 mg total) under the tongue every 5 (five) minutes as needed for chest pain.   oxyCODONE -acetaminophen  10-325 MG tablet Commonly known as: PERCOCET Take 1 tablet by mouth every 4 (four) hours as needed for pain.   pantoprazole  40 MG tablet Commonly known as: PROTONIX  Take 40 mg by mouth every morning.   potassium chloride  10 MEQ tablet Commonly known as: KLOR-CON  M Take 1 tablet (10 mEq total) by mouth daily.   prasugrel  10 MG Tabs tablet Commonly known as: EFFIENT  Take 1 tablet (10 mg total) by mouth every morning.   promethazine  25 MG tablet Commonly known as: PHENERGAN  Take 25 mg by mouth 2 (two) times daily as needed for nausea or vomiting (Headache).   ranolazine  500 MG 12 hr tablet Commonly known as: Ranexa  Take 2 tablets (1,000 mg total) by mouth 2 (two) times daily. What changed: how much to take   Repatha  SureClick 140 MG/ML Soaj Generic drug: Evolocumab  Inject 140 mg into the skin every 14 (fourteen) days.   tiZANidine  4 MG tablet Commonly known as: ZANAFLEX  Take 4-8 mg by mouth See admin instructions. Take 4 mg 3 times daily and 8 mg at bedtime   traZODone  50 MG tablet Commonly known as: DESYREL  Take 50 mg by mouth at bedtime as needed for  sleep.   Tresiba  FlexTouch 100 UNIT/ML FlexTouch Pen Generic drug: insulin  degludec Inject 28 Units into the skin daily.        Outstanding Labs/Studies   N/a   Duration of Discharge Encounter   Greater than 30 minutes including physician time.  Signed, Manuelita Rummer, NP 01/08/2023, 1:52 PM

## 2023-01-08 NOTE — Telephone Encounter (Signed)
Spoke with patient and he was discharged. Did not need any further assistance

## 2023-01-08 NOTE — TOC Transition Note (Signed)
 Transition of Care [facility]) - Discharge Note   Patient Details  Name: Terry Nelson MRN: [id number] Date of Birth: Mar 31, 1955  Transition of Care [facility]) CM/SW Contact:  Waddell Barnie Rama, RN Phone Number: 01/08/2023, 1:34 PM   Clinical Narrative:    For dc today, he has transport home , no needs.   Final next level of care: Home/Self Care Barriers to Discharge: Continued Medical Work up   Patient Goals and CMS Choice Patient states their goals for this hospitalization and ongoing recovery are:: return home   Choice offered to / list presented to : NA      Discharge Placement                       Discharge Plan and Services Additional resources added to the After Visit Summary for   In-house Referral: NA Discharge Planning Services: CM Consult Post Acute Care Choice: NA          DME Arranged: N/A         HH Arranged: NA          Social Drivers of Health (SDOH) Interventions SDOH Screenings   Food Insecurity: No Food Insecurity (07/31/2022)  Housing: Low Risk  (07/31/2022)  Transportation Needs: No Transportation Needs (07/31/2022)  Utilities: Not At Risk (07/31/2022)  Tobacco Use: Medium Risk (01/01/2023)     Readmission Risk Interventions     No data to display

## 2023-01-08 NOTE — Plan of Care (Signed)
  Problem: Education: Goal: Understanding of CV disease, CV risk reduction, and recovery process will improve Outcome: Progressing Goal: Individualized Educational Video(s) Outcome: Progressing   

## 2023-01-08 NOTE — Telephone Encounter (Signed)
Patient is requesting call back to discuss what to expect after procedure done yesterday with Dr. Clifton James. Requesting call back.

## 2023-01-08 NOTE — TOC Initial Note (Signed)
 Transition of Care [facility]) - Initial/Assessment Note    Patient Details  Name: Terry Nelson MRN: [id number] Date of Birth: [birth year]  Transition of Care [facility]) CM/SW Contact:    Waddell Barnie Rama, RN Phone Number: 01/08/2023, 11:47 AM  Clinical Narrative:                 From home with spouse, has PCP , [place], and insurance on file, states has no HH services in place at this time or DME at home.  He is indep and he is the caregiver for his wife who is w/chair bound.  States Brother in social worker will transport him home at [employer] and daughter is support system, states gets medications from [facility].  Pta self ambulatory .  Expected Discharge Plan: Home/Self Care Barriers to Discharge: Continued Medical Work up   Patient Goals and CMS Choice Patient states their goals for this hospitalization and ongoing recovery are:: return home   Choice offered to / list presented to : NA      Expected Discharge Plan and Services In-house Referral: NA Discharge Planning Services: CM Consult Post Acute Care Choice: NA Living arrangements for the past 2 months: Single Family Home                 DME Arranged: N/A         HH Arranged: NA          Prior Living Arrangements/Services Living arrangements for the past 2 months: Single Family Home Lives with:: Spouse Patient language and need for interpreter reviewed:: Yes Do you feel safe going back to the place where you live?: Yes      Need for Family Participation in Patient Care: Yes (Comment) Care giver support system in place?: Yes (comment) (daughter)   Criminal Activity/Legal Involvement Pertinent to Current Situation/Hospitalization: No - Comment as needed  Activities of Daily Living   ADL Screening (condition at time of admission) Independently performs ADLs?: Yes (appropriate for developmental age) Is the patient deaf or have difficulty hearing?: Yes Does the patient have difficulty seeing, even when wearing  glasses/contacts?: No Does the patient have difficulty concentrating, remembering, or making decisions?: No  Permission Sought/Granted Permission sought to share information with : Case Manager Permission granted to share information with : Yes, Verbal Permission Granted              Emotional Assessment Appearance:: Appears stated age Attitude/Demeanor/Rapport: Engaged Affect (typically observed): Appropriate Orientation: : Oriented to Self, Oriented to Place, Oriented to  Time, Oriented to Situation   Psych Involvement: No (comment)  Admission diagnosis:  Unstable angina (HCC) [I20.0] Patient Active Problem List   Diagnosis Date Noted   Hyperlipidemia LDL goal <50 09/28/2022   Lumbar stenosis with neurogenic claudication 04/13/2022   CAD (coronary artery disease), native coronary artery 06/02/2021   Acute diastolic heart failure (HCC)    Iron deficiency anemia    Unstable angina (HCC) 09/29/2020   PAC (premature atrial contraction) 08/11/2020   PAF (paroxysmal atrial fibrillation) (HCC) 08/11/2020   Hypertension 04/28/2019   Coronary artery disease involving native coronary artery of native heart with unstable angina pectoris (HCC) 04/27/2019   CAD (coronary artery disease) of artery bypass graft 04/09/2019   Angina pectoris (HCC) 04/09/2019   Displacement of lumbar disc with radiculopathy 01/14/2018   Controlled substance agreement signed 07/10/2017   Lumbar herniated disc 07/10/2017   Fever 08/04/2016   Hx of migraine headaches 08/04/2016   Hypovolemia dehydration 04/13/2015  Visit for monitoring Tikosyn therapy 01/22/2015   Left ureteral stone 10/21/2013   Hypertensive urgency 03/12/2012   PCP:  Jacobo Poe, PA-C Pharmacy:   [facility] - [place], [place] - [phone] [place] [address] Phone: [phone] Fax: [phone]  [place] [facility] [address] [place] [address] Phone: [phone]  Fax: [phone]     Social Drivers of Health (SDOH) Social History: SDOH Screenings   Food Insecurity: No Food Insecurity (07/31/2022)  Housing: Low Risk  (07/31/2022)  Transportation Needs: No Transportation Needs (07/31/2022)  Utilities: Not At Risk (07/31/2022)  Tobacco Use: Medium Risk (01/01/2023)   SDOH Interventions:     Readmission Risk Interventions     No data to display

## 2023-01-10 ENCOUNTER — Telehealth: Payer: Self-pay | Admitting: Internal Medicine

## 2023-01-10 NOTE — Telephone Encounter (Signed)
 Bilateral leg muscles(entire leg) and shoulders to forearms involved and muscles were jumping just like I had been doing jumping jacks. Says this has been happening intermittently since his hospital stay on 01/07/23. Happened total of twice since his return home, but seems to be progressing, reports started today around 8am and didn't stop until about 11:30pm. Says BP and HR have been fine about 111/70mmhg. Reports he increased his Ranexa  and decreased Metoprolol . Does take supplemental Magnesium  and has missed it for a few days, started back this morning. No recent mag levels in chart. Wants MD's thoughts.

## 2023-01-10 NOTE — Telephone Encounter (Signed)
 Patient stated he is having involuntary movement from his hands and feet. Patient stated it happened before and he would stand up and it would go away after 2-56mins. Patient stated it is happening for longer periods and at a different degree. Patient is unsure who to speak with in regards to this. Please advise.

## 2023-01-11 NOTE — Telephone Encounter (Signed)
 Thukkani, Arun K, MD  You14 hours ago (8:08 PM)   AT It may be his statin.  Have him stop it for a week to see if his symptoms irmprove.  If they do not, restart statin and he needs to speak with his PCP.  If they do improve, he needs to let us  know, add it to his allergies list, and have him see pharm D for lipid management.   Returned call to patient and provided above recommendations. He will try the statin vacation and let us  know.

## 2023-01-15 ENCOUNTER — Ambulatory Visit: Payer: 59 | Attending: Cardiology | Admitting: Student

## 2023-01-15 ENCOUNTER — Encounter: Payer: Self-pay | Admitting: Student

## 2023-01-15 VITALS — BP 132/76 | HR 77

## 2023-01-15 DIAGNOSIS — I10 Essential (primary) hypertension: Secondary | ICD-10-CM | POA: Insufficient documentation

## 2023-01-15 MED ORDER — RANOLAZINE ER 500 MG PO TB12: 500.0000 mg | ORAL_TABLET | Freq: Two times a day (BID) | ORAL | 0 refills | Status: DC

## 2023-01-15 MED ORDER — SPIRONOLACTONE 25 MG PO TABS: 12.5000 mg | ORAL_TABLET | Freq: Every day | ORAL | Status: DC

## 2023-01-15 NOTE — Progress Notes (Signed)
 Patient ID: Terry Nelson                 DOB: 11-22-1955                    MRN: [id number]      HPI: Terry Nelson is a 68 y.o. male patient referred to lipid clinic by Dr,Thukkani. PMH is significant for hypertension, unstable angina, HLD,CAD s/p CABG and multiple PCI's with full metal jacket from the proximal to distal LAD, PAF, migraines, DM2  Patient has recent left heart cath done. Due to Soft BP amlodipine  and Spironolactone  were held, Imdur  were stopped after the procedure and patient were referred to [facility] for BP follow up.  Patient presented today. He thought he will be seeing provider post procedure to discuss the result of the left heart cath. Reports home BP ~130-135/70. Denies any dizziness, SOB, chest pain or swelling. Could not tolerate Ranexa  1000 mg twice daily due to muscle twitches in upper and lower extremities so self adjusted to previous dose 500 mg twice daily. Follows low salt and low fat diet and walks at least 1-1:30 hrs per day ( takes his dog for walk multiple times per day), LDL above the goal on high intensity statin but patient does not want to add any medication. Patient  Current Medications: Lipitor  80 mg daily  Intolerances: none  Risk Factors: T2DM,hypertension, unstable angina, HLD,CAD s/p CABG and multiple PCI's with full metal jacket from the proximal to distal LAD, LDL goal: <55 mg/dl  Current BP medications: Losartan  12.5 mg daily, Ranexa  500 mg twice daily, metoprolol  ER 100 mg daily and furosemide  20 mg daily  BP goal <130/80  Diet: low salt low fat diet   Exercise: walking 60-90 min per day   Family History:  Relation Problem Comments  Mother (Deceased) High blood pressure   Stroke     Father  Diabetes     Social History:  Alcohol : none  Smoking: never   Labs: Lipid Panel     Component Value Date/Time   CHOL 141 04/05/2022 1158   TRIG 81 04/05/2022 1158   HDL 64 04/05/2022 1158   CHOLHDL 2.2 04/05/2022 1158   CHOLHDL 2.4  02/02/2021 0358   VLDL 14 02/02/2021 0358   LDLCALC 61 04/05/2022 1158   LABVLDL 16 04/05/2022 1158    Past Medical History:  Diagnosis Date   Acute diastolic heart failure (HCC)    Angina pectoris (HCC) 04/09/2019   Anxiety state    pt denies   Body mass index (bmi) 26.0-26.9, adult    CAD (coronary artery disease)    a. 2002 s/p CABG x 3 (LIMA->LAD, VG->OM1->OM2); b. s/p prior LAD and dRCA/RPDA stenting; c. 04/2019 PTCA & lithotripsy of ISR throughout LAD; d. 08/2020 PCI/scoring balloon PTCA throughout LAD 2/2 ISR; e. 09/2020 Cath: LM nl, LAD 40ost/p, 40p/d, 75d ISR (FFR 0.86), 50d, LCX small, OM1 90, OM2 25, RCA 20d ISR, RPDA 20 ISR, VG->OM1->OM2 ok, LIMA->LAD 121m-->Med rx.   CAD (coronary artery disease) of artery bypass graft 04/09/2019   CAD (coronary artery disease), native coronary artery 06/02/2021   Chronic heart failure with preserved ejection fraction (HFpEF) (HCC)    a. 09/2020 Echo: Ef 60-65%, no rwma, nl RV size/fxn. Mod dil LA. triv MR. Mild AoV sclerosis w/o stenosis.   CKD (chronic kidney disease), stage II    Coronary artery disease involving native coronary artery of native heart with unstable angina pectoris (HCC) 04/27/2019  DDD (degenerative disc disease), lumbar    DM (diabetes mellitus) (HCC)    Gastritis    GERD (gastroesophageal reflux disease)    Headache    Hemorrhage    History of kidney stones    Hyperlipidemia    Hyperlipidemia LDL goal <70 04/09/2019   Hypertension    Hypertensive urgency 03/12/2012   Kidney stones    Lumbago    Myocardial infarction [facility])    Neck pain    PAC (premature atrial contraction) 08/11/2020   PAF (paroxysmal atrial fibrillation) (HCC) 08/11/2020   Paroxysmal A-fib (HCC)    a. CHA2DS2VASc = 4-->Eliquis ; b. 06/2020 Zio: No afib. Up to 20 beats PSVT.   Pernicious anemia    Unstable angina (HCC)     Current Outpatient Medications on File Prior to Visit  Medication Sig Dispense Refill   aspirin  EC 81 MG tablet Take 1  tablet (81 mg total) by mouth daily. Swallow whole. 30 tablet 11   atorvastatin  (LIPITOR ) 80 MG tablet Take 80 mg by mouth at bedtime.     Blood Glucose Monitoring Suppl (CONTOUR NEXT MONITOR) w/Device KIT daily.     Continuous Glucose Sensor (FREESTYLE LIBRE 3 SENSOR) MISC USE TO TEST BLOOD SUGAR AND CHANGE EVERY 14 DAYS AS DIRECTED     empagliflozin  (JARDIANCE ) 10 MG TABS tablet Take 1 tablet (10 mg total) by mouth daily before breakfast. 90 tablet 3   Evolocumab  (REPATHA  SURECLICK) 140 MG/ML SOAJ Inject 140 mg into the skin every 14 (fourteen) days. 6 mL 3   furosemide  (LASIX ) 40 MG tablet Take 1 tablet (40 mg total) by mouth daily. Take 40mg  (1 tablet) daily. Take additional 40mg  as needed for weight gain of 2 pounds overnight or 5 pounds in one week. (Patient taking differently: Take 20 mg by mouth daily.) 30 tablet 11   gabapentin  (NEURONTIN ) 300 MG capsule Take 1,200-1,500 mg by mouth See admin instructions. Takes 1200 mg in the morning and 1500 in the evening     glucose blood (CONTOUR NEXT TEST) test strip by Does not apply route.     Insulin  Pen Needle (BD PEN NEEDLE NANO U/F) 32G X 4 MM MISC USE ONCE AS DIRECTED     losartan  (COZAAR ) 25 MG tablet Take 0.5 tablets (12.5 mg total) by mouth daily. 45 tablet 3   magnesium  oxide (MAG-OX) 400 MG tablet Take 800 mg by mouth every morning.     metoprolol  (TOPROL  XL) 200 MG 24 hr tablet Take 1 tablet (200 mg total) by mouth daily. 90 tablet 3   nitroGLYCERIN  (NITROSTAT ) 0.4 MG SL tablet Place 1 tablet (0.4 mg total) under the tongue every 5 (five) minutes as needed for chest pain. 75 tablet 3   oxyCODONE -acetaminophen  (PERCOCET) 10-325 MG tablet Take 1 tablet by mouth every 4 (four) hours as needed for pain.     pantoprazole  (PROTONIX ) 40 MG tablet Take 40 mg by mouth every morning.     potassium chloride  (KLOR-CON  M) 10 MEQ tablet Take 1 tablet (10 mEq total) by mouth daily. 90 tablet 3   prasugrel  (EFFIENT ) 10 MG TABS tablet Take 1 tablet (10 mg  total) by mouth every morning. 30 tablet 0   promethazine  (PHENERGAN ) 25 MG tablet Take 25 mg by mouth 2 (two) times daily as needed for nausea or vomiting (Headache).     ranolazine  (RANEXA ) 500 MG 12 hr tablet Take 2 tablets (1,000 mg total) by mouth 2 (two) times daily. 180 tablet 1   tiZANidine  (ZANAFLEX ) 4  MG tablet Take 4-8 mg by mouth See admin instructions. Take 4 mg 3 times daily and 8 mg at bedtime     traZODone  (DESYREL ) 50 MG tablet Take 50 mg by mouth at bedtime as needed for sleep.     TRESIBA  FLEXTOUCH 100 UNIT/ML FlexTouch Pen Inject 28 Units into the skin daily.     No current facility-administered medications on file prior to visit.    Allergies  Allergen Reactions   Metoclopramide Other (See Comments)    DYSKINESIAS contractions of whole body   Zetia  [Ezetimibe ] Diarrhea   Penicillins Swelling and Rash        Hypertension/HF Assessment: In office BP 136/76 mmHg heart rate 77 (goal <130/80) Home BP ~ 130-135/70 home BP monitor never validated  Currently on Losartan  12.5 mg daily, Ranexa  500 mg twice daily, metoprolol  ER 100 mg daily and furosemide  20 mg daily  Patient has recent left heart cath done. Due to Soft BP amlodipine  and Spironolactone  were held, Imdur  were stopped after the procedure  Tolerates current HF/BP well without any side effects Denies SOB, palpitation, chest pain, headaches,or swelling Follows low salt diet and exercise regularly  Potassium was WNL when he was on aldactone ,  Losartan   furosemide  and  10 MEq potassium supplement    Plan:  Start taking spironolactone  12.5 mg daily  Continue taking osartan 12.5 mg daily, Ranexa  500 mg twice daily, metoprolol  ER 100 mg daily and furosemide  20 mg daily and potassium 10 mEq daily  Patient to keep record of BP readings with heart rate and report to us  at the next visit Follow up lab(s): BMP on Jan 16     Thank you,  Robbi Blanch, [place].[facility] [address] [place], [place] [address]  Phone: [phone]; Fax: [phone]

## 2023-01-15 NOTE — Patient Instructions (Addendum)
 Changes made by your pharmacist Robbi Blanch, PharmD at today's visit:    Instructions/Changes  (what do you need to do) Your Notes  (what you did and when you did it)  Start taking Spironolactone  12.5 mg daily and continue taking Losartan  12.5 mg daily, Ranexa  500 mg twice daily, metoprolol  ER 100 mg daily and furosemide  20 mg daily    Get BMP lab done on 24 Jan 2023    Continue checking BP at home     Bring all of your meds, your BP cuff and your record of home blood pressures to your next appointment.    HOW TO TAKE YOUR BLOOD PRESSURE AT HOME  Rest 5 minutes before taking your blood pressure.  Don't smoke or drink caffeinated beverages for at least 30 minutes before. Take your blood pressure before (not after) you eat. Sit comfortably with your back supported and both feet on the floor (don't cross your legs). Elevate your arm to heart level on a table or a desk. Use the proper sized cuff. It should fit smoothly and snugly around your bare upper arm. There should be enough room to slip a fingertip under the cuff. The bottom edge of the cuff should be 1 inch above the crease of the elbow. Ideally, take 3 measurements at one sitting and record the average.  Important lifestyle changes to control high blood pressure  Intervention  Effect on the BP  Lose extra pounds and watch your waistline Weight loss is one of the most effective lifestyle changes for controlling blood pressure. If you're overweight or obese, losing even a small amount of weight can help reduce blood pressure. Blood pressure might go down by about 1 millimeter of mercury (mm Hg) with each kilogram (about 2.2 pounds) of weight lost.  Exercise regularly As a general goal, aim for at least 30 minutes of moderate physical activity every day. Regular physical activity can lower high blood pressure by about 5 to 8 mm Hg.  Eat a healthy diet Eating a diet rich in whole grains, fruits, vegetables, and low-fat dairy  products and low in saturated fat and cholesterol. A healthy diet can lower high blood pressure by up to 11 mm Hg.  Reduce salt (sodium) in your diet Even a small reduction of sodium in the diet can improve heart health and reduce high blood pressure by about 5 to 6 mm Hg.  Limit alcohol  One drink equals 12 ounces of beer, 5 ounces of wine, or 1.5 ounces of 80-proof liquor.  Limiting alcohol  to less than one drink a day for women or two drinks a day for men can help lower blood pressure by about 4 mm Hg.   If you have any questions or concerns please use My Chart to send questions or call the office at [phone]

## 2023-01-15 NOTE — Assessment & Plan Note (Signed)
 Assessment: In office BP 136/76 mmHg heart rate 77 (goal <130/80) Home BP ~ 130-135/70 home BP monitor never validated  Currently on Losartan  12.5 mg daily, Ranexa  500 mg twice daily, metoprolol  ER 100 mg daily and furosemide  20 mg daily  Patient has recent left heart cath done. Due to Soft BP amlodipine  and Spironolactone  were held, Imdur  were stopped after the procedure  Tolerates current HF/BP well without any side effects Denies SOB, palpitation, chest pain, headaches,or swelling Follows low salt diet and exercise regularly  Potassium was WNL when he was on aldactone ,  Losartan   furosemide  and  10 MEq potassium supplement    Plan:  Start taking spironolactone  12.5 mg daily  Continue taking osartan 12.5 mg daily, Ranexa  500 mg twice daily, metoprolol  ER 100 mg daily and furosemide  20 mg daily and potassium 10 mEq daily  Patient to keep record of BP readings with heart rate and report to us  at the next visit Follow up lab(s): BMP on Jan 16

## 2023-01-23 ENCOUNTER — Other Ambulatory Visit: Payer: Self-pay

## 2023-01-23 MED ORDER — NITROGLYCERIN 0.4 MG SL SUBL: 0.4000 mg | SUBLINGUAL_TABLET | SUBLINGUAL | 3 refills | Status: AC | PRN

## 2023-02-05 ENCOUNTER — Telehealth: Payer: Self-pay | Admitting: Pharmacist

## 2023-02-05 NOTE — Telephone Encounter (Signed)
Pt will be seeing Dr.Thukkani tomorrow ( Jan 29) will get lab done before seeing Dr.Thukkani.

## 2023-02-05 NOTE — Progress Notes (Unsigned)
Cardiology Office Note:   Date:  02/06/2023  ID:  Terry Nelson, DOB 1955/09/02, MRN [id number] PCP:  Terry Harold, PA-C  [facility] Providers Cardiologist:  Terry Skeans, MD Referring MD: Terry Harold, PA-C  Chief Complaint/Reason for Referral: Chest pain ASSESSMENT:    1. Unstable angina (HCC)   2. Diastolic dysfunction   3. Type 2 diabetes mellitus with complication, with long-term current use of insulin (HCC)   4. Hypertension associated with diabetes (HCC)   5. Hyperlipidemia associated with type 2 diabetes mellitus (HCC)   6. BMI 22.0-22.9, adult     PLAN:   In order of problems listed above: Unstable angina: The patient has developed unstable angina.  I recommend that the patient be admitted to the hospital for coronary angiography with an eye towards using a drug-coated balloon rather than plain balloon angioplasty.  The patient unfortunately has issues at home and his wife requires his care.  He is therefore unable to be admitted to the hospital today.  For this reason we will arrange for coronary angiography on Friday.  The patient understands the risks of postponing including possibility of sudden cardiac death.  He remains on extended DAPT despite the fact the patient has in-stent restenosis and not stent thrombosis.  I think this is due to the multiple layers of stent and longest segment of stents.  I think this is reasonable. Diastolic dysfunction: Continue Lasix, Jardiance, spironolactone, and losartan. Type 2 diabetes mellitus: Continue aspirin, atorvastatin 80, Jardiance, and losartan. Hypertension: Blood pressure is elevated; this is likely due to to ongoing chest pain.  Continue current therapy for now. Hyperlipidemia: Check lipid panel and LFTs today. Elevated BMI: Will refer to pharmacy regarding GLP-1 receptor agonist therapy.        Informed Consent   Shared Decision Making/Informed Consent The risks [stroke (1 in 1000), death (1 in 1000), kidney  failure [usually temporary] (1 in 500), bleeding (1 in 200), allergic reaction [possibly serious] (1 in 200)], benefits (diagnostic support and management of coronary artery disease) and alternatives of a cardiac catheterization were discussed in detail with Terry Nelson and he is willing to proceed.      Dispo:  No follow-ups on file.      Medication Adjustments/Labs and Tests Ordered: Current medicines are reviewed at length with the patient today.  Concerns regarding medicines are outlined above.  The following changes have been made:     Labs/tests ordered: Orders Placed This Encounter  Procedures   Basic metabolic panel   CBC   EKG 12-Lead    Medication Changes: Meds ordered this encounter  Medications   ranolazine (RANEXA) 1000 MG SR tablet    Sig: Take 1 tablet (1,000 mg total) by mouth 2 (two) times daily.    Dispense:  180 tablet    Refill:  3    Dose change (increased)    Current medicines are reviewed at length with the patient today.  The patient does not have concerns regarding medicines.  I spent 38 minutes reviewing all clinical data during and prior to this visit including all relevant imaging studies, laboratories, clinical information from other health systems and prior notes from both Cardiology and other specialties, interviewing the patient, conducting a complete physical examination, and coordinating care in order to formulate a comprehensive and personalized evaluation and treatment plan.   History of Present Illness:      FOCUSED PROBLEM LIST:   Coronary artery disease LIMA to LAD (occluded), vein graft to OM1-OM2 Multiple  PCI's due to ACS with full metal jacket proximal to distal LAD with multiple layers of stent July 2024 scoring balloon angioplasty December 2024 coronary angiography distal LAD occlusion On indefinite DAPT due to long segment stenting and multiple layers Diastolic dysfunction LVH, G3 DD, EF 60 to 65%, mild MR TTE 2023 Type 2 diabetes  mellitus On insulin Hypertension Hyperlipidemia Goal LDL less than 55 LP(a) 29.7 Intolerant of Zetia >> diarrhea BMI 30 March 2022:  The patient is a 68 y.o. male with the indicated medical history here for routine cardiology follow-up.  The patient was seen last by Terry Nelson around 6 months ago.  He had previously done cutting balloon angioplasty due to ongoing symptoms.  The patient developed an episode of severe chest pain following the procedure however at his last visit with Terry Nelson was doing relatively well.   He is to undergo lumbar laminectomy with general anesthesia in the coming weeks.  He is doing well.  He denies any exertional angina with his activities of daily living.  His breathing is also well.  He denies any significant bleeding or bruising.  He is required no hospitalizations or emergency room visit room visits.  He has not had any severe episodes of chest pain like the one that occurred several months ago.  He is otherwise well without complaints.   September 2024: In the interim the patient contacted our office due to chest pain.  He was seen by Terry Nelson in July for a DOD visit and referred for coronary angiography.  He had a scoring balloon angioplasty of his LAD extensively.  He was seen in follow-up and due to an inability to obtain an LDL of less than 55 Zetia was started.  He felt diarrhea in relation to Zetia so this was stopped.  The patient will be seeing pharmacy for PCSK9 inhibitor therapy.   The patient has been doing well from a chest pain perspective.  He is caring for his wife most of the time.  He is able to do what he needs to do in a day without much by way of chest pain.  He has noticed low blood pressures at home with pressures in the 100 systolic.  His biggest issue today honestly is back pain.  He will be seeing a provider regarding this next week.  Right now he does not know if surgery is in his near future or not.  Plan: Decrease losartan to 12.5 mg.   Meet with pharmacy regarding lipid management.  1/25: Patient returns for expedited follow-up due to chest pain.  Since I saw him last, the patient underwent coronary angiography in December 2024 to frequent nitroglycerin usage.  He underwent coronary angiography which demonstrated no targets for intervention and the distal LAD was now occluded.  His Ranexa was increased and he was discharged home.  He tells me that he has developed increasing chest pain.  He has required frequent nitroglycerin use.  He used 5 nitroglycerin overnight.  He still has ongoing chest pain.  He has been compliant with his medical therapy other than going back down to Ranexa 500 mg twice daily due to headaches.        Current Medications: Current Meds  Medication Sig   aspirin EC 81 MG tablet Take 1 tablet (81 mg total) by mouth daily. Swallow whole.   atorvastatin (LIPITOR) 80 MG tablet Take 80 mg by mouth at bedtime.   Blood Glucose Monitoring Suppl (CONTOUR NEXT MONITOR) w/Device KIT  daily.   Continuous Glucose Sensor (FREESTYLE LIBRE 3 SENSOR) MISC USE TO TEST BLOOD SUGAR AND CHANGE EVERY 14 DAYS AS DIRECTED   empagliflozin (JARDIANCE) 10 MG TABS tablet Take 1 tablet (10 mg total) by mouth daily before breakfast.   furosemide (LASIX) 40 MG tablet Take 1 tablet (40 mg total) by mouth daily. Take 40mg  (1 tablet) daily. Take additional 40mg  as needed for weight gain of 2 pounds overnight or 5 pounds in one week. (Patient taking differently: Take 20 mg by mouth daily.)   gabapentin (NEURONTIN) 300 MG capsule Take 1,200-1,500 mg by mouth See admin instructions. Takes 1200 mg in the morning and 1500 in the evening   glipiZIDE (GLUCOTROL) 10 MG tablet Take 10 mg by mouth 2 (two) times daily.   glucose blood (CONTOUR NEXT TEST) test strip by Does not apply route.   Insulin Pen Needle (BD PEN NEEDLE NANO U/F) 32G X 4 MM MISC USE ONCE AS DIRECTED   losartan (COZAAR) 25 MG tablet Take 0.5 tablets (12.5 mg total) by mouth daily.    metoprolol succinate (TOPROL-XL) 100 MG 24 hr tablet Take 1 tablet (100 mg total) by mouth daily. Take with or immediately following a meal.   nitroGLYCERIN (NITROSTAT) 0.4 MG SL tablet Place 1 tablet (0.4 mg total) under the tongue every 5 (five) minutes as needed for chest pain.   oxyCODONE-acetaminophen (PERCOCET) 10-325 MG tablet Take 1 tablet by mouth every 4 (four) hours as needed for pain.   pantoprazole (PROTONIX) 40 MG tablet Take 40 mg by mouth every morning.   potassium chloride (KLOR-CON M) 10 MEQ tablet Take 1 tablet (10 mEq total) by mouth daily.   prasugrel (EFFIENT) 10 MG TABS tablet Take 1 tablet (10 mg total) by mouth every morning.   promethazine (PHENERGAN) 25 MG tablet Take 25 mg by mouth 2 (two) times daily as needed for nausea or vomiting (Headache).   ranolazine (RANEXA) 1000 MG SR tablet Take 1 tablet (1,000 mg total) by mouth 2 (two) times daily.   spironolactone (ALDACTONE) 25 MG tablet Take 0.5 tablets (12.5 mg total) by mouth daily.   tiZANidine (ZANAFLEX) 2 MG tablet Take by mouth.   TOUJEO SOLOSTAR 300 UNIT/ML Solostar Pen SMARTSIG:46 Unit(s) SUB-Q Daily   traZODone (DESYREL) 50 MG tablet Take 50 mg by mouth at bedtime as needed for sleep.   [DISCONTINUED] ranolazine (RANEXA) 500 MG 12 hr tablet Take 1 tablet (500 mg total) by mouth 2 (two) times daily.     Review of Systems:   Please see the history of present illness.    All other systems reviewed and are negative.     EKGs/Labs/Other Test Reviewed:   EKG: EKG from December 2024 demonstrates sinus bradycardia with nonspecific ST and T wave changes  EKG Interpretation Date/Time:  Wednesday February 06 2023 15:45:42 EST Ventricular Rate:  67 PR Interval:  120 QRS Duration:  74 QT Interval:  416 QTC Calculation: 439 R Axis:   24  Text Interpretation: Normal sinus rhythm Nonspecific T wave abnormality When compared with ECG of 07-Jan-2023 06:07, T wave inversion no longer evident in Anterior leads  Confirmed by Terry Nelson ([id number]) on 02/06/2023 3:54:26 PM         Risk Assessment/Calculations:          Physical Exam:   VS:  BP 135/80   Pulse 66   Ht 5\' 6"  (1.676 m)   Wt 140 lb 6.4 oz (63.7 kg)   SpO2 99%   BMI  22.66 kg/m        Wt Readings from Last 3 Encounters:  02/06/23 140 lb 6.4 oz (63.7 kg)  01/08/23 134 lb 11.2 oz (61.1 kg)  01/01/23 141 lb 3.2 oz (64 kg)      GENERAL:  No apparent distress, AOx3 HEENT:  No carotid bruits, +2 carotid impulses, no scleral icterus CAR: RRR no murmurs, gallops, rubs, or thrills RES:  Clear to auscultation bilaterally ABD:  Soft, nontender, nondistended, positive bowel sounds x 4 VASC:  +2 radial pulses, +2 carotid pulses NEURO:  CN 2-12 grossly intact; motor and sensory grossly intact PSYCH:  No active depression or anxiety EXT:  No edema, ecchymosis, or cyanosis  Signed, [name], MD  02/06/2023 4:35 PM    [facility] [address] [place], [place], [place]  [address] Phone: [phone]; Fax: [phone]   Note:  This document was prepared using Dragon voice recognition software and may include unintentional dictation errors.

## 2023-02-05 NOTE — Telephone Encounter (Signed)
Due for BMP - spironolactone was added to other hypertension medication. call to remind N/A LVM

## 2023-02-05 NOTE — H&P (View-Only) (Signed)
Cardiology Office Note:   Date:  02/06/2023  ID:  Terry Nelson, DOB [birth year], MRN [id number] PCP:  Jiles Harold, PA-C  [facility] Providers Cardiologist:  Alverda Skeans, MD Referring MD: Jiles Harold, PA-C  Chief Complaint/Reason for Referral: Chest pain ASSESSMENT:    1. Unstable angina (HCC)   2. Diastolic dysfunction   3. Type 2 diabetes mellitus with complication, with long-term current use of insulin (HCC)   4. Hypertension associated with diabetes (HCC)   5. Hyperlipidemia associated with type 2 diabetes mellitus (HCC)   6. BMI 22.0-22.9, adult     PLAN:   In order of problems listed above: Unstable angina: The patient has developed unstable angina.  I recommend that the patient be admitted to the hospital for coronary angiography with an eye towards using a drug-coated balloon rather than plain balloon angioplasty.  The patient unfortunately has issues at home and his wife requires his care.  He is therefore unable to be admitted to the hospital today.  For this reason we will arrange for coronary angiography on Friday.  The patient understands the risks of postponing including possibility of sudden cardiac death.  He remains on extended DAPT despite the fact the patient has in-stent restenosis and not stent thrombosis.  I think this is due to the multiple layers of stent and longest segment of stents.  I think this is reasonable. Diastolic dysfunction: Continue Lasix, Jardiance, spironolactone, and losartan. Type 2 diabetes mellitus: Continue aspirin, atorvastatin 80, Jardiance, and losartan. Hypertension: Blood pressure is elevated; this is likely due to to ongoing chest pain.  Continue current therapy for now. Hyperlipidemia: Check lipid panel and LFTs today. Elevated BMI: Will refer to pharmacy regarding GLP-1 receptor agonist therapy.        Informed Consent   Shared Decision Making/Informed Consent The risks [stroke (1 in 1000), death (1 in 1000), kidney  failure [usually temporary] (1 in 500), bleeding (1 in 200), allergic reaction [possibly serious] (1 in 200)], benefits (diagnostic support and management of coronary artery disease) and alternatives of a cardiac catheterization were discussed in detail with Terry Nelson and he is willing to proceed.      Dispo:  No follow-ups on file.      Medication Adjustments/Labs and Tests Ordered: Current medicines are reviewed at length with the patient today.  Concerns regarding medicines are outlined above.  The following changes have been made:     Labs/tests ordered: Orders Placed This Encounter  Procedures   Basic metabolic panel   CBC   EKG 12-Lead    Medication Changes: Meds ordered this encounter  Medications   ranolazine (RANEXA) 1000 MG SR tablet    Sig: Take 1 tablet (1,000 mg total) by mouth 2 (two) times daily.    Dispense:  180 tablet    Refill:  3    Dose change (increased)    Current medicines are reviewed at length with the patient today.  The patient does not have concerns regarding medicines.  I spent 38 minutes reviewing all clinical data during and prior to this visit including all relevant imaging studies, laboratories, clinical information from other health systems and prior notes from both Cardiology and other specialties, interviewing the patient, conducting a complete physical examination, and coordinating care in order to formulate a comprehensive and personalized evaluation and treatment plan.   History of Present Illness:      FOCUSED PROBLEM LIST:   Coronary artery disease LIMA to LAD (occluded), vein graft to OM1-OM2 Multiple  PCI's due to ACS with full metal jacket proximal to distal LAD with multiple layers of stent July 2024 scoring balloon angioplasty December 2024 coronary angiography distal LAD occlusion On indefinite DAPT due to long segment stenting and multiple layers Diastolic dysfunction LVH, G3 DD, EF 60 to 65%, mild MR TTE 2023 Type 2 diabetes  mellitus On insulin Hypertension Hyperlipidemia Goal LDL less than 55 LP(a) 29.7 Intolerant of Zetia >> diarrhea BMI 30 March 2022:  The patient is a 68 y.o. male with the indicated medical history here for routine cardiology follow-up.  The patient was seen last by Dr. Katrinka Blazing around 6 months ago.  He had previously done cutting balloon angioplasty due to ongoing symptoms.  The patient developed an episode of severe chest pain following the procedure however at his last visit with Dr. Katrinka Blazing was doing relatively well.   He is to undergo lumbar laminectomy with general anesthesia in the coming weeks.  He is doing well.  He denies any exertional angina with his activities of daily living.  His breathing is also well.  He denies any significant bleeding or bruising.  He is required no hospitalizations or emergency room visit room visits.  He has not had any severe episodes of chest pain like the one that occurred several months ago.  He is otherwise well without complaints.   September 2024: In the interim the patient contacted our office due to chest pain.  He was seen by Dr. Clifton James in July for a DOD visit and referred for coronary angiography.  He had a scoring balloon angioplasty of his LAD extensively.  He was seen in follow-up and due to an inability to obtain an LDL of less than 55 Zetia was started.  He felt diarrhea in relation to Zetia so this was stopped.  The patient will be seeing pharmacy for PCSK9 inhibitor therapy.   The patient has been doing well from a chest pain perspective.  He is caring for his wife most of the time.  He is able to do what he needs to do in a day without much by way of chest pain.  He has noticed low blood pressures at home with pressures in the 100 systolic.  His biggest issue today honestly is back pain.  He will be seeing a provider regarding this next week.  Right now he does not know if surgery is in his near future or not.  Plan: Decrease losartan to 12.5 mg.   Meet with pharmacy regarding lipid management.  1/25: Patient returns for expedited follow-up due to chest pain.  Since I saw him last, the patient underwent coronary angiography in December 2024 to frequent nitroglycerin usage.  He underwent coronary angiography which demonstrated no targets for intervention and the distal LAD was now occluded.  His Ranexa was increased and he was discharged home.  He tells me that he has developed increasing chest pain.  He has required frequent nitroglycerin use.  He used 5 nitroglycerin overnight.  He still has ongoing chest pain.  He has been compliant with his medical therapy other than going back down to Ranexa 500 mg twice daily due to headaches.        Current Medications: Current Meds  Medication Sig   aspirin EC 81 MG tablet Take 1 tablet (81 mg total) by mouth daily. Swallow whole.   atorvastatin (LIPITOR) 80 MG tablet Take 80 mg by mouth at bedtime.   Blood Glucose Monitoring Suppl (CONTOUR NEXT MONITOR) w/Device KIT  daily.   Continuous Glucose Sensor (FREESTYLE LIBRE 3 SENSOR) MISC USE TO TEST BLOOD SUGAR AND CHANGE EVERY 14 DAYS AS DIRECTED   empagliflozin (JARDIANCE) 10 MG TABS tablet Take 1 tablet (10 mg total) by mouth daily before breakfast.   furosemide (LASIX) 40 MG tablet Take 1 tablet (40 mg total) by mouth daily. Take 40mg  (1 tablet) daily. Take additional 40mg  as needed for weight gain of 2 pounds overnight or 5 pounds in one week. (Patient taking differently: Take 20 mg by mouth daily.)   gabapentin (NEURONTIN) 300 MG capsule Take 1,200-1,500 mg by mouth See admin instructions. Takes 1200 mg in the morning and 1500 in the evening   glipiZIDE (GLUCOTROL) 10 MG tablet Take 10 mg by mouth 2 (two) times daily.   glucose blood (CONTOUR NEXT TEST) test strip by Does not apply route.   Insulin Pen Needle (BD PEN NEEDLE NANO U/F) 32G X 4 MM MISC USE ONCE AS DIRECTED   losartan (COZAAR) 25 MG tablet Take 0.5 tablets (12.5 mg total) by mouth daily.    metoprolol succinate (TOPROL-XL) 100 MG 24 hr tablet Take 1 tablet (100 mg total) by mouth daily. Take with or immediately following a meal.   nitroGLYCERIN (NITROSTAT) 0.4 MG SL tablet Place 1 tablet (0.4 mg total) under the tongue every 5 (five) minutes as needed for chest pain.   oxyCODONE-acetaminophen (PERCOCET) 10-325 MG tablet Take 1 tablet by mouth every 4 (four) hours as needed for pain.   pantoprazole (PROTONIX) 40 MG tablet Take 40 mg by mouth every morning.   potassium chloride (KLOR-CON M) 10 MEQ tablet Take 1 tablet (10 mEq total) by mouth daily.   prasugrel (EFFIENT) 10 MG TABS tablet Take 1 tablet (10 mg total) by mouth every morning.   promethazine (PHENERGAN) 25 MG tablet Take 25 mg by mouth 2 (two) times daily as needed for nausea or vomiting (Headache).   ranolazine (RANEXA) 1000 MG SR tablet Take 1 tablet (1,000 mg total) by mouth 2 (two) times daily.   spironolactone (ALDACTONE) 25 MG tablet Take 0.5 tablets (12.5 mg total) by mouth daily.   tiZANidine (ZANAFLEX) 2 MG tablet Take by mouth.   TOUJEO SOLOSTAR 300 UNIT/ML Solostar Pen SMARTSIG:46 Unit(s) SUB-Q Daily   traZODone (DESYREL) 50 MG tablet Take 50 mg by mouth at bedtime as needed for sleep.   [DISCONTINUED] ranolazine (RANEXA) 500 MG 12 hr tablet Take 1 tablet (500 mg total) by mouth 2 (two) times daily.     Review of Systems:   Please see the history of present illness.    All other systems reviewed and are negative.     EKGs/Labs/Other Test Reviewed:   EKG: EKG from December 2024 demonstrates sinus bradycardia with nonspecific ST and T wave changes  EKG Interpretation Date/Time:  Wednesday February 06 2023 15:45:42 EST Ventricular Rate:  67 PR Interval:  120 QRS Duration:  74 QT Interval:  416 QTC Calculation: 439 R Axis:   24  Text Interpretation: Normal sinus rhythm Nonspecific T wave abnormality When compared with ECG of 07-Jan-2023 06:07, T wave inversion no longer evident in Anterior leads  Confirmed by Alverda Skeans ([id number]) on 02/06/2023 3:54:26 PM         Risk Assessment/Calculations:          Physical Exam:   VS:  BP 135/80   Pulse 66   Ht 5\' 6"  (1.676 m)   Wt 140 lb 6.4 oz (63.7 kg)   SpO2 99%   BMI  22.66 kg/m        Wt Readings from Last 3 Encounters:  02/06/23 140 lb 6.4 oz (63.7 kg)  01/08/23 134 lb 11.2 oz (61.1 kg)  01/01/23 141 lb 3.2 oz (64 kg)      GENERAL:  No apparent distress, AOx3 HEENT:  No carotid bruits, +2 carotid impulses, no scleral icterus CAR: RRR no murmurs, gallops, rubs, or thrills RES:  Clear to auscultation bilaterally ABD:  Soft, nontender, nondistended, positive bowel sounds x 4 VASC:  +2 radial pulses, +2 carotid pulses NEURO:  CN 2-12 grossly intact; motor and sensory grossly intact PSYCH:  No active depression or anxiety EXT:  No edema, ecchymosis, or cyanosis  Signed, [name], MD  02/06/2023 4:35 PM    [facility] [address] [place], [place], [place]  [address] Phone: [phone]; Fax: [phone]   Note:  This document was prepared using Dragon voice recognition software and may include unintentional dictation errors.

## 2023-02-06 ENCOUNTER — Encounter: Payer: Self-pay | Admitting: Internal Medicine

## 2023-02-06 ENCOUNTER — Ambulatory Visit: Payer: 59 | Attending: Internal Medicine | Admitting: Internal Medicine

## 2023-02-06 VITALS — BP 135/80 | HR 66 | Ht 66.0 in | Wt 140.4 lb

## 2023-02-06 DIAGNOSIS — Z794 Long term (current) use of insulin: Secondary | ICD-10-CM | POA: Insufficient documentation

## 2023-02-06 DIAGNOSIS — E785 Hyperlipidemia, unspecified: Secondary | ICD-10-CM | POA: Insufficient documentation

## 2023-02-06 DIAGNOSIS — E118 Type 2 diabetes mellitus with unspecified complications: Secondary | ICD-10-CM | POA: Insufficient documentation

## 2023-02-06 DIAGNOSIS — E1169 Type 2 diabetes mellitus with other specified complication: Secondary | ICD-10-CM | POA: Diagnosis present

## 2023-02-06 DIAGNOSIS — Z6822 Body mass index (BMI) 22.0-22.9, adult: Secondary | ICD-10-CM | POA: Insufficient documentation

## 2023-02-06 DIAGNOSIS — E1159 Type 2 diabetes mellitus with other circulatory complications: Secondary | ICD-10-CM | POA: Diagnosis not present

## 2023-02-06 DIAGNOSIS — I152 Hypertension secondary to endocrine disorders: Secondary | ICD-10-CM | POA: Insufficient documentation

## 2023-02-06 DIAGNOSIS — I5189 Other ill-defined heart diseases: Secondary | ICD-10-CM | POA: Diagnosis not present

## 2023-02-06 DIAGNOSIS — I200 Unstable angina: Secondary | ICD-10-CM | POA: Insufficient documentation

## 2023-02-06 DIAGNOSIS — I25708 Atherosclerosis of coronary artery bypass graft(s), unspecified, with other forms of angina pectoris: Secondary | ICD-10-CM

## 2023-02-06 MED ORDER — RANOLAZINE ER 1000 MG PO TB12: 1000.0000 mg | ORAL_TABLET | Freq: Two times a day (BID) | ORAL | 3 refills | Status: DC

## 2023-02-06 NOTE — Patient Instructions (Addendum)
Medication Instructions:  Your physician has recommended you make the following change in your medication:  1) INCREASE ranolazine (Ranexa) to 1,000 mg twice daily  *If you need a refill on your cardiac medications before your next appointment, please call your pharmacy*  Lab Work: TODAY: CBC, BMET If you have labs (blood work) drawn today and your tests are completely normal, you will receive your results only by: MyChart Message (if you have MyChart) OR A paper copy in the mail If you have any lab test that is abnormal or we need to change your treatment, we will call you to review the results.  Testing/Procedures: Your physician has requested that you have a cardiac catheterization. Cardiac catheterization is used to diagnose and/or treat various heart conditions. Doctors may recommend this procedure for a number of different reasons. The most common reason is to evaluate chest pain. Chest pain can be a symptom of coronary artery disease (CAD), and cardiac catheterization can show whether plaque is narrowing or blocking your heart's arteries. This procedure is also used to evaluate the valves, as well as measure the blood flow and oxygen levels in different parts of your heart. For further information please visit [email or web address] Please follow instruction sheet, as given.   Follow-Up: Will be determined after heart catheterization.  Other Instructions       Cardiac/Peripheral Catheterization   You are scheduled for a Cardiac Catheterization on Friday, January 31 with Dr. Alverda Skeans.  1. Please arrive at the [facility] (Main Entrance A) at [facility]: [address] [place], [place] [address] at 8:30 AM (This time is 2 hour(s) before your procedure to ensure your preparation).   Free valet parking service is available. You will check in at ADMITTING. The support person will be asked to wait in the waiting room.  It is OK to have someone drop you off and come back  when you are ready to be discharged.        Special note: Every effort is made to have your procedure done on time. Please understand that emergencies sometimes delay scheduled procedures.  2. Diet: Do not eat solid foods after midnight.  You may have clear liquids until 5 AM the day of the procedure.  3. Labs: TODAY  4. Medication instructions in preparation for your procedure:   Contrast Allergy: No  Take only 23 units of insulin the night before your procedure. Do not take any insulin on the day of the procedure. This is half of your regular dose.   On the morning of your procedure, take Aspirin 81 mg and any morning medicines NOT listed above.  You may use sips of water.  5. Plan to go home the same day, you will only stay overnight if medically necessary. 6. You MUST have a responsible adult to drive you home. 7. An adult MUST be with you the first 24 hours after you arrive home. 8. Bring a current list of your medications, and the last time and date medication taken. 9. Bring ID and current insurance cards. 10.Please wear clothes that are easy to get on and off and wear slip-on shoes.  Thank you for allowing [place] to care for you!   -- [facility]     1st Floor: - Lobby - Registration  - Pharmacy  - Lab - Cafe  2nd Floor: - PV Lab - Diagnostic Testing (echo, CT, nuclear med)  3rd Floor: - Vacant  4th Floor: - TCTS (cardiothoracic  surgery) - AFib Clinic - Structural Heart Clinic - Vascular Surgery  - Vascular Ultrasound  5th Floor: - HeartCare Cardiology (general and EP) - Clinical Pharmacy for coumadin, hypertension, lipid, weight-loss medications, and med management appointments    Valet parking services will be available as well.

## 2023-02-07 ENCOUNTER — Other Ambulatory Visit: Payer: Self-pay | Admitting: Cardiology

## 2023-02-07 ENCOUNTER — Telehealth: Payer: Self-pay | Admitting: *Deleted

## 2023-02-07 ENCOUNTER — Encounter: Payer: Self-pay | Admitting: Internal Medicine

## 2023-02-07 DIAGNOSIS — E875 Hyperkalemia: Secondary | ICD-10-CM

## 2023-02-07 LAB — CBC
Hematocrit: 43.9 % (ref 37.5–51.0)
Hemoglobin: 14.8 g/dL (ref 13.0–17.7)
MCH: 34.1 pg — ABNORMAL HIGH (ref 26.6–33.0)
MCHC: 33.7 g/dL (ref 31.5–35.7)
MCV: 101 fL — ABNORMAL HIGH (ref 79–97)
Platelets: 185 10*3/uL (ref 150–450)
RBC: 4.34 x10E6/uL (ref 4.14–5.80)
RDW: 12.8 % (ref 11.6–15.4)
WBC: 8.1 10*3/uL (ref 3.4–10.8)

## 2023-02-07 LAB — BASIC METABOLIC PANEL
BUN/Creatinine Ratio: 17 (ref 10–24)
BUN: 19 mg/dL (ref 8–27)
CO2: 28 mmol/L (ref 20–29)
Calcium: 10.2 mg/dL (ref 8.6–10.2)
Chloride: 102 mmol/L (ref 96–106)
Creatinine, Ser: 1.1 mg/dL (ref 0.76–1.27)
Glucose: 111 mg/dL — ABNORMAL HIGH (ref 70–99)
Potassium: 6.3 mmol/L (ref 3.5–5.2)
Sodium: 142 mmol/L (ref 134–144)
eGFR: 74 mL/min/{1.73_m2} (ref >59)

## 2023-02-07 MED ORDER — SODIUM POLYSTYRENE SULFONATE PO POWD: ORAL | 0 refills | Status: DC

## 2023-02-07 MED ORDER — SODIUM POLYSTYRENE SULFONATE 15 GM/60ML CO SUSP: 0 refills | Status: DC

## 2023-02-07 NOTE — Telephone Encounter (Signed)
Pt aware. He will stop at pharmacy this morning to pick up medication.  Pt agreeable to plan.

## 2023-02-07 NOTE — Telephone Encounter (Signed)
K was noted to be 6.3. unsure if this is hemolyzed. Repeat K ordered.

## 2023-02-07 NOTE — Telephone Encounter (Signed)
Pt called back in stating he cannot afford this medication, please advise if other options.

## 2023-02-07 NOTE — Telephone Encounter (Signed)
-----   Message from Terry Nelson sent at 02/07/2023  7:18 AM EST ----- Please tell patient to hold lasix and losartan for now.  I'd like him to take kayxelate 15g twice today.   If there are any issues with this, we will have to delay his procedure which is scehduled for tomorrow.

## 2023-02-07 NOTE — Addendum Note (Signed)
Addended by: Baird Lyons on: 02/07/2023 11:06 AM   Modules accepted: Orders

## 2023-02-07 NOTE — Telephone Encounter (Signed)
Pt said suspension formulary was not affordable ($51.00 after insurance). Spoke to [facility] who carries the powder formula ($16 w/o insurance).  Rx sent there. Pt agreeable and will pick up shortly.

## 2023-02-07 NOTE — Telephone Encounter (Signed)
Per Dr Jamse Mead for Cardiac Cath:  [id number]   Cardiac Catheterization scheduled at [facility] for: Friday February 08, 2023 10:30 AM Arrival time [facility] Main Entrance A at: 8 AM-needs BMP  Nothing to eat after midnight prior to procedure, clear liquids until 5 AM day of procedure.   Medication instructions: -Hold:  Jardiance/Toujeo-AM of procedure  Pt reports not currently taking Glipizide  Pt reports he is not currently taking lasix/losartan/KCl  Pt to hold spironolactone AM of procedure. -Other usual morning medications can be taken with sips of water including aspirin 81 mg and Effient 10 mg.  Plan to go home the same day, you will only stay overnight if medically necessary.  You must have responsible adult to drive you home.  Someone must be with you the first 24 hours after you arrive home.  Reviewed procedure instructions with patient.  Patient reports he has taken 1 dose of Kayexalate and plans to another dose this evening before bedtime.

## 2023-02-08 ENCOUNTER — Ambulatory Visit (HOSPITAL_COMMUNITY)
Admission: RE | Admit: 2023-02-08 | Discharge: 2023-02-08 | Disposition: A | Discharge status: Home / Self Care | Payer: 59 | Attending: Internal Medicine | Admitting: Internal Medicine

## 2023-02-08 ENCOUNTER — Encounter (HOSPITAL_COMMUNITY)
Admission: RE | Disposition: A | Discharge status: Home / Self Care | Payer: Self-pay | Source: Home / Self Care | Attending: Internal Medicine

## 2023-02-08 ENCOUNTER — Other Ambulatory Visit: Payer: Self-pay | Admitting: Cardiology

## 2023-02-08 ENCOUNTER — Other Ambulatory Visit: Payer: Self-pay

## 2023-02-08 DIAGNOSIS — Z7982 Long term (current) use of aspirin: Secondary | ICD-10-CM | POA: Insufficient documentation

## 2023-02-08 DIAGNOSIS — E1169 Type 2 diabetes mellitus with other specified complication: Secondary | ICD-10-CM | POA: Diagnosis not present

## 2023-02-08 DIAGNOSIS — E785 Hyperlipidemia, unspecified: Secondary | ICD-10-CM | POA: Insufficient documentation

## 2023-02-08 DIAGNOSIS — Z794 Long term (current) use of insulin: Secondary | ICD-10-CM | POA: Diagnosis not present

## 2023-02-08 DIAGNOSIS — I10 Essential (primary) hypertension: Secondary | ICD-10-CM | POA: Diagnosis not present

## 2023-02-08 DIAGNOSIS — Z7984 Long term (current) use of oral hypoglycemic drugs: Secondary | ICD-10-CM | POA: Insufficient documentation

## 2023-02-08 DIAGNOSIS — E1159 Type 2 diabetes mellitus with other circulatory complications: Secondary | ICD-10-CM | POA: Diagnosis not present

## 2023-02-08 DIAGNOSIS — I25110 Atherosclerotic heart disease of native coronary artery with unstable angina pectoris: Secondary | ICD-10-CM | POA: Insufficient documentation

## 2023-02-08 DIAGNOSIS — I25708 Atherosclerosis of coronary artery bypass graft(s), unspecified, with other forms of angina pectoris: Secondary | ICD-10-CM | POA: Insufficient documentation

## 2023-02-08 DIAGNOSIS — I200 Unstable angina: Secondary | ICD-10-CM | POA: Diagnosis present

## 2023-02-08 DIAGNOSIS — Z79899 Other long term (current) drug therapy: Secondary | ICD-10-CM

## 2023-02-08 DIAGNOSIS — Z955 Presence of coronary angioplasty implant and graft: Secondary | ICD-10-CM | POA: Diagnosis not present

## 2023-02-08 DIAGNOSIS — Z01812 Encounter for preprocedural laboratory examination: Secondary | ICD-10-CM

## 2023-02-08 DIAGNOSIS — I5031 Acute diastolic (congestive) heart failure: Secondary | ICD-10-CM

## 2023-02-08 DIAGNOSIS — I25710 Atherosclerosis of autologous vein coronary artery bypass graft(s) with unstable angina pectoris: Secondary | ICD-10-CM | POA: Diagnosis not present

## 2023-02-08 HISTORY — PX: LEFT HEART CATH AND CORS/GRAFTS ANGIOGRAPHY: CATH118250

## 2023-02-08 LAB — BASIC METABOLIC PANEL
Anion gap: 11 (ref 5–15)
BUN: 15 mg/dL (ref 8–23)
CO2: 26 mmol/L (ref 22–32)
Calcium: 9.3 mg/dL (ref 8.9–10.3)
Chloride: 103 mmol/L (ref 98–111)
Creatinine, Ser: 0.96 mg/dL (ref 0.61–1.24)
GFR, Estimated: >60 mL/min (ref >60)
Glucose, Bld: 118 mg/dL — ABNORMAL HIGH (ref 70–99)
Potassium: 3.4 mmol/L — ABNORMAL LOW (ref 3.5–5.1)
Sodium: 140 mmol/L (ref 135–145)

## 2023-02-08 LAB — GLUCOSE, CAPILLARY: Glucose-Capillary: 102 mg/dL — ABNORMAL HIGH (ref 70–99)

## 2023-02-08 SURGERY — LEFT HEART CATH AND CORS/GRAFTS ANGIOGRAPHY
Anesthesia: LOCAL

## 2023-02-08 MED ORDER — MIDAZOLAM HCL 2 MG/2ML IJ SOLN
INTRAMUSCULAR | Status: DC | PRN
  Administered 2023-02-08 (×2): 1 mg via INTRAVENOUS

## 2023-02-08 MED ORDER — ASPIRIN 81 MG PO CHEW: 81.0000 mg | CHEWABLE_TABLET | ORAL | Status: DC

## 2023-02-08 MED ORDER — ACETAMINOPHEN 325 MG PO TABS
650.0000 mg | ORAL_TABLET | ORAL | Status: DC | PRN
Start: 2023-02-08 — End: 2023-02-08

## 2023-02-08 MED ORDER — VERAPAMIL HCL 2.5 MG/ML IV SOLN
INTRAVENOUS | Status: AC
  Filled 2023-02-08: qty 2

## 2023-02-08 MED ORDER — PRASUGREL HCL 10 MG PO TABS: 10.0000 mg | ORAL_TABLET | ORAL | Status: DC

## 2023-02-08 MED ORDER — HEPARIN SODIUM (PORCINE) 1000 UNIT/ML IJ SOLN
INTRAMUSCULAR | Status: DC | PRN
  Administered 2023-02-08: 5000 [IU] via INTRA_ARTERIAL

## 2023-02-08 MED ORDER — LIDOCAINE HCL (PF) 1 % IJ SOLN
INTRAMUSCULAR | Status: AC
  Filled 2023-02-08: qty 30

## 2023-02-08 MED ORDER — SODIUM CHLORIDE 0.9 % WEIGHT BASED INFUSION: 1.0000 mL/kg/h | INTRAVENOUS | Status: DC

## 2023-02-08 MED ORDER — SODIUM CHLORIDE 0.9% FLUSH
3.0000 mL | Freq: Two times a day (BID) | INTRAVENOUS | Status: DC
Start: 2023-02-08 — End: 2023-02-08

## 2023-02-08 MED ORDER — IOHEXOL 350 MG/ML SOLN
INTRAVENOUS | Status: DC | PRN
  Administered 2023-02-08: 50 mL

## 2023-02-08 MED ORDER — MIDAZOLAM HCL 2 MG/2ML IJ SOLN
INTRAMUSCULAR | Status: AC
  Filled 2023-02-08: qty 2

## 2023-02-08 MED ORDER — SODIUM CHLORIDE 0.9 % IV SOLN: 250.0000 mL | INTRAVENOUS | Status: DC | PRN

## 2023-02-08 MED ORDER — VERAPAMIL HCL 2.5 MG/ML IV SOLN
INTRAVENOUS | Status: DC | PRN
  Administered 2023-02-08: 10 mL via INTRA_ARTERIAL

## 2023-02-08 MED ORDER — HYDRALAZINE HCL 20 MG/ML IJ SOLN: 10.0000 mg | INTRAMUSCULAR | Status: DC | PRN

## 2023-02-08 MED ORDER — HEPARIN SODIUM (PORCINE) 1000 UNIT/ML IJ SOLN
INTRAMUSCULAR | Status: AC
  Filled 2023-02-08: qty 10

## 2023-02-08 MED ORDER — FUROSEMIDE 40 MG PO TABS: 40.0000 mg | ORAL_TABLET | Freq: Two times a day (BID) | ORAL | 1 refills | Status: AC

## 2023-02-08 MED ORDER — ONDANSETRON HCL 4 MG/2ML IJ SOLN: 4.0000 mg | Freq: Four times a day (QID) | INTRAMUSCULAR | Status: DC | PRN

## 2023-02-08 MED ORDER — FENTANYL CITRATE (PF) 100 MCG/2ML IJ SOLN
INTRAMUSCULAR | Status: DC | PRN
  Administered 2023-02-08 (×2): 25 ug via INTRAVENOUS

## 2023-02-08 MED ORDER — LIDOCAINE HCL (PF) 1 % IJ SOLN
INTRAMUSCULAR | Status: DC | PRN
  Administered 2023-02-08: 2 mL via INTRADERMAL

## 2023-02-08 MED ORDER — SODIUM CHLORIDE 0.9% FLUSH: 3.0000 mL | INTRAVENOUS | Status: DC | PRN

## 2023-02-08 MED ORDER — SODIUM CHLORIDE 0.9 % WEIGHT BASED INFUSION: 3.0000 mL/kg/h | INTRAVENOUS | Status: DC

## 2023-02-08 MED ORDER — HEPARIN (PORCINE) IN NACL 1000-0.9 UT/500ML-% IV SOLN
INTRAVENOUS | Status: DC | PRN
  Administered 2023-02-08 (×2): 500 mL

## 2023-02-08 MED ORDER — PRASUGREL HCL 10 MG PO TABS: 10.0000 mg | ORAL_TABLET | Freq: Every day | ORAL | Status: DC

## 2023-02-08 MED ORDER — LABETALOL HCL 5 MG/ML IV SOLN: 10.0000 mg | INTRAVENOUS | Status: DC | PRN

## 2023-02-08 MED ORDER — FENTANYL CITRATE (PF) 100 MCG/2ML IJ SOLN
INTRAMUSCULAR | Status: AC
  Filled 2023-02-08: qty 2

## 2023-02-08 SURGICAL SUPPLY — 8 items
CATH 5FR JL3.5 JR4 ANG PIG MP (CATHETERS) IMPLANT
DEVICE RAD COMP TR BAND LRG (VASCULAR PRODUCTS) IMPLANT
GLIDESHEATH SLEND SS 6F .021 (SHEATH) IMPLANT
KIT HEMO VALVE WATCHDOG (MISCELLANEOUS) IMPLANT
PACK CARDIAC CATHETERIZATION (CUSTOM PROCEDURE TRAY) ×2 IMPLANT
SET ATX-X65L (MISCELLANEOUS) IMPLANT
WIRE EMERALD 3MM-J .035X260CM (WIRE) IMPLANT
WIRE HI TORQ VERSACORE-J 145CM (WIRE) IMPLANT

## 2023-02-08 NOTE — Progress Notes (Signed)
Mardella Layman, NP notified of client c/o 3/10 chest pain and he states he would not take NTG for this pain and he will notify me if pain increases and no new orders

## 2023-02-08 NOTE — Progress Notes (Signed)
Small amt swelling noted at radial band and pressure held x and no further swelling noted

## 2023-02-08 NOTE — Progress Notes (Signed)
BMET ordered per Dr. Lynnette Caffey

## 2023-02-08 NOTE — Interval H&P Note (Signed)
History and Physical Interval Note:  02/08/2023 8:45 AM  Terry Nelson  has presented today for surgery, with the diagnosis of chest pain.  The various methods of treatment have been discussed with the patient and family. After consideration of risks, benefits and other options for treatment, the patient has consented to  Procedure(s): LEFT HEART CATH AND CORS/GRAFTS ANGIOGRAPHY (N/A) as a surgical intervention.  The patient's history has been reviewed, patient examined, no change in status, stable for surgery.  I have reviewed the patient's chart and labs.  Questions were answered to the patient's satisfaction.     [name]

## 2023-02-08 NOTE — Interval H&P Note (Signed)
History and Physical Interval Note:  02/08/2023 8:56 AM  Terry Nelson  has presented today for surgery, with the diagnosis of chest pain.  The various methods of treatment have been discussed with the patient and family. After consideration of risks, benefits and other options for treatment, the patient has consented to  Procedure(s): LEFT HEART CATH AND CORS/GRAFTS ANGIOGRAPHY (N/A) as a surgical intervention.  The patient's history has been reviewed, patient examined, no change in status, stable for surgery.  I have reviewed the patient's chart and labs.  Questions were answered to the patient's satisfaction.     [name]

## 2023-02-11 ENCOUNTER — Other Ambulatory Visit: Payer: Self-pay | Admitting: *Deleted

## 2023-02-11 ENCOUNTER — Encounter (HOSPITAL_COMMUNITY): Payer: Self-pay | Admitting: Internal Medicine

## 2023-02-11 DIAGNOSIS — Z79899 Other long term (current) drug therapy: Secondary | ICD-10-CM

## 2023-02-23 NOTE — Progress Notes (Unsigned)
 Cardiology Office Note:   Date:  03/01/2023  ID:  Terry Nelson, DOB November 01, 1955, MRN [id number] PCP:  Jiles Harold, PA-C  [facility] Providers Cardiologist:  Alverda Skeans, MD Referring MD: Jiles Harold, PA-C  Chief Complaint/Reason for Referral: Chest pain ASSESSMENT:    1. Angina pectoris (HCC)   2. Diastolic dysfunction   3. Type 2 diabetes mellitus with complication, with long-term current use of insulin (HCC)   4. Hypertension associated with diabetes (HCC)   5. Hyperlipidemia associated with type 2 diabetes mellitus (HCC)   6. BMI 22.0-22.9, adult      PLAN:   In order of problems listed above: Angina pectoris: The patient continues to have chest pain symptoms with some atypical features.  We will continue Imdur but change from 60 mg twice daily to 120 mg daily.  Decrease Ranexa to 500 mg twice daily due to patient intolerance.  Continue Toprol 200 mg and as needed nitroglycerin.   Diastolic dysfunction: Continue Jardiance 10 mg daily, Lasix 40 mg twice daily, losartan 25 mg daily, Toprol 200 mg daily, and spironolactone 12.5 mg daily Type 2 diabetes mellitus: Continue aspirin 81 mg, atorvastatin 80 mg, Jardiance 10 mg, and losartan . Hypertension: Blood pressure is well-controlled today. Hyperlipidemia: Check lipid panel and LFTs today. Elevated BMI: Continue diet and exercise modification.  Will defer GLP-1 receptor agonist therapy.       Informed Consent   Shared Decision Making/Informed Consent The risks [stroke (1 in 1000), death (1 in 1000), kidney failure [usually temporary] (1 in 500), bleeding (1 in 200), allergic reaction [possibly serious] (1 in 200)], benefits (diagnostic support and management of coronary artery disease) and alternatives of a cardiac catheterization were discussed in detail with Terry Nelson and he is willing to proceed.      Dispo:  Return in about 3 months (around 05/29/2023).      Medication Adjustments/Labs and Tests Ordered: Current  medicines are reviewed at length with the patient today.  Concerns regarding medicines are outlined above.  The following changes have been made:     Labs/tests ordered: Orders Placed This Encounter  Procedures   Lipid panel   Hepatic function panel    Medication Changes: Meds ordered this encounter  Medications   ranolazine (RANEXA) 500 MG 12 hr tablet    Sig: Take 1 tablet (500 mg total) by mouth 2 (two) times daily.    Dispense:  180 tablet    Refill:  3    Dose change (decreased on 03/01/2023)   isosorbide mononitrate (IMDUR) 120 MG 24 hr tablet    Sig: Take 1 tablet (120 mg total) by mouth daily.    Dispense:  90 tablet    Refill:  3    Dose change (increased on 03/01/2023)    Current medicines are reviewed at length with the patient today.  The patient does not have concerns regarding medicines.  I spent 31 minutes reviewing all clinical data during and prior to this visit including all relevant imaging studies, laboratories, clinical information from other health systems and prior notes from both Cardiology and other specialties, interviewing the patient, conducting a complete physical examination, and coordinating care in order to formulate a comprehensive and personalized evaluation and treatment plan.   History of Present Illness:      FOCUSED PROBLEM LIST:   Coronary artery disease LIMA to LAD (occluded), vein graft to OM1-OM2 Multiple PCI's due to ACS with full metal jacket proximal to distal LAD with multiple layers of  stent July 2024 scoring balloon angioplasty December 2024 coronary angiography distal LAD occlusion January 2025 coronary angiography unchanged distal LAD occlusion, LVEDP 24 mmHg On indefinite DAPT due to long segment stenting and multiple layers Diastolic dysfunction LVH, G3 DD, EF 60 to 65%, mild MR TTE 2023 Type 2 diabetes mellitus On insulin Hypertension Hyperlipidemia Goal LDL less than 55 LP(a) 29.7 Intolerant of Zetia >>  diarrhea BMI 30 March 2022:  The patient is a 68 y.o. male with the indicated medical history here for routine cardiology follow-up.  The patient was seen last by Dr. Katrinka Blazing around 6 months ago.  He had previously done cutting balloon angioplasty due to ongoing symptoms.  The patient developed an episode of severe chest pain following the procedure however at his last visit with Dr. Katrinka Blazing was doing relatively well.   He is to undergo lumbar laminectomy with general anesthesia in the coming weeks.  He is doing well.  He denies any exertional angina with his activities of daily living.  His breathing is also well.  He denies any significant bleeding or bruising.  He is required no hospitalizations or emergency room visit room visits.  He has not had any severe episodes of chest pain like the one that occurred several months ago.  He is otherwise well without complaints.   September 2024: In the interim the patient contacted our office due to chest pain.  He was seen by Dr. Clifton James in July for a DOD visit and referred for coronary angiography.  He had a scoring balloon angioplasty of his LAD extensively.  He was seen in follow-up and due to an inability to obtain an LDL of less than 55 Zetia was started.  He felt diarrhea in relation to Zetia so this was stopped.  The patient will be seeing pharmacy for PCSK9 inhibitor therapy.   The patient has been doing well from a chest pain perspective.  He is caring for his wife most of the time.  He is able to do what he needs to do in a day without much by way of chest pain.  He has noticed low blood pressures at home with pressures in the 100 systolic.  His biggest issue today honestly is back pain.  He will be seeing a provider regarding this next week.  Right now he does not know if surgery is in his near future or not.  Plan: Decrease losartan to 12.5 mg.  Meet with pharmacy regarding lipid management.  1/25: Patient returns for expedited follow-up due to chest  pain.  Since I saw him last, the patient underwent coronary angiography in December 2024 to frequent nitroglycerin usage.  He underwent coronary angiography which demonstrated no targets for intervention and the distal LAD was now occluded.  His Ranexa was increased and he was discharged home.  He tells me that he has developed increasing chest pain.  He has required frequent nitroglycerin use.  He used 5 nitroglycerin overnight.  He still has ongoing chest pain.  He has been compliant with his medical therapy other than going back down to Ranexa 500 mg twice daily due to headaches.  Plan:  Refer for coronary angiography.  2/25: The patient returns for routine follow-up.  Coronary angiography demonstrated unchanged burden of disease with distal LAD occlusion.  Patient's LVEDP was 22 mmHg and Lasix was increased to 40 mg twice daily.  The patient had issues with Ranexa 1000 mg twice daily with resulting headaches.  He continues to have chest  pain with and without exertion.  He tells me that he has not been using nitroglycerin as much as previously but seems to need it a few times a day.  He did not increase his Lasix to 40 mg twice daily.  He is still quite debilitated due to his chest pain syndrome.  He denies any palpitations, paroxysmal nocturnal dyspnea, orthopnea.  Fortunately has not required any emergency room visits or hospitalizations.        Current Medications: Current Meds  Medication Sig   acetaminophen (TYLENOL) 500 MG tablet Take 1,000 mg by mouth every 6 (six) hours as needed for mild pain (pain score 1-3), moderate pain (pain score 4-6) or headache.   aspirin EC 81 MG tablet Take 1 tablet (81 mg total) by mouth daily. Swallow whole.   atorvastatin (LIPITOR) 80 MG tablet Take 80 mg by mouth at bedtime.   Blood Glucose Monitoring Suppl (CONTOUR NEXT MONITOR) w/Device KIT daily.   Continuous Glucose Sensor (FREESTYLE LIBRE 3 SENSOR) MISC USE TO TEST BLOOD SUGAR AND CHANGE EVERY 14 DAYS AS  DIRECTED   empagliflozin (JARDIANCE) 10 MG TABS tablet Take 1 tablet (10 mg total) by mouth daily before breakfast.   furosemide (LASIX) 40 MG tablet Take 1 tablet (40 mg total) by mouth 2 (two) times daily.   gabapentin (NEURONTIN) 300 MG capsule Take 1,200-1,500 mg by mouth See admin instructions. Take 1200 mg in the morning and 1500 mg at bedtime   glipiZIDE (GLUCOTROL) 10 MG tablet Take 10 mg by mouth 2 (two) times daily.   glucose blood (CONTOUR NEXT TEST) test strip by Does not apply route.   Insulin Pen Needle (BD PEN NEEDLE NANO U/F) 32G X 4 MM MISC USE ONCE AS DIRECTED   isosorbide mononitrate (IMDUR) 120 MG 24 hr tablet Take 1 tablet (120 mg total) by mouth daily.   losartan (COZAAR) 25 MG tablet Take 0.5 tablets (12.5 mg total) by mouth daily.   metoprolol (TOPROL-XL) 200 MG 24 hr tablet Take 100 mg by mouth daily. Take with or immediately following a meal.   nitroGLYCERIN (NITROSTAT) 0.4 MG SL tablet Place 1 tablet (0.4 mg total) under the tongue every 5 (five) minutes as needed for chest pain.   oxyCODONE-acetaminophen (PERCOCET) 10-325 MG tablet Take 1 tablet by mouth every 4 (four) hours as needed for pain.   pantoprazole (PROTONIX) 40 MG tablet Take 40 mg by mouth every morning.   potassium chloride (KLOR-CON M) 10 MEQ tablet Take 1 tablet (10 mEq total) by mouth daily.   prasugrel (EFFIENT) 10 MG TABS tablet Take 1 tablet (10 mg total) by mouth every morning.   promethazine (PHENERGAN) 25 MG tablet Take 25 mg by mouth 2 (two) times daily as needed for nausea or vomiting (Headache).   ranolazine (RANEXA) 500 MG 12 hr tablet Take 1 tablet (500 mg total) by mouth 2 (two) times daily.   sodium polystyrene (KAYEXALATE) powder Take 15 grams TWICE today   spironolactone (ALDACTONE) 25 MG tablet Take 0.5 tablets (12.5 mg total) by mouth daily. (Patient taking differently: Take 25 mg by mouth daily.)   tiZANidine (ZANAFLEX) 4 MG tablet Take 4-8 mg by mouth See admin instructions. Take 4 mg  in the morning, lunch ,and evening meal and 8 mg at bedtime   TOUJEO SOLOSTAR 300 UNIT/ML Solostar Pen Inject 30 Units into the skin daily.   traZODone (DESYREL) 50 MG tablet Take 50 mg by mouth at bedtime.   [DISCONTINUED] isosorbide mononitrate (IMDUR) 60 MG  24 hr tablet Take 60 mg by mouth daily.   [DISCONTINUED] ranolazine (RANEXA) 1000 MG SR tablet Take 1 tablet (1,000 mg total) by mouth 2 (two) times daily.     Review of Systems:   Please see the history of present illness.    All other systems reviewed and are negative.     EKGs/Labs/Other Test Reviewed:   EKG: EKG from January 2025 demonstrates sinus rhythm with nonspecific T wave abnormalities  EKG Interpretation Date/Time:    Ventricular Rate:    PR Interval:    QRS Duration:    QT Interval:    QTC Calculation:   R Axis:      Text Interpretation:           Risk Assessment/Calculations:          Physical Exam:   VS:  BP 130/70   Pulse 74   Ht 5\' 7"  (1.702 m)   Wt 138 lb 9.6 oz (62.9 kg)   SpO2 97%   BMI 21.71 kg/m        Wt Readings from Last 3 Encounters:  03/01/23 138 lb 9.6 oz (62.9 kg)  02/08/23 140 lb (63.5 kg)  02/06/23 140 lb 6.4 oz (63.7 kg)      GENERAL:  No apparent distress, AOx3 HEENT:  No carotid bruits, +2 carotid impulses, no scleral icterus CAR: RRR no murmurs, gallops, rubs, or thrills RES:  Clear to auscultation bilaterally ABD:  Soft, nontender, nondistended, positive bowel sounds x 4 VASC:  +2 radial pulses, +2 carotid pulses NEURO:  CN 2-12 grossly intact; motor and sensory grossly intact PSYCH:  No active depression or anxiety EXT:  No edema, ecchymosis, or cyanosis  Signed, [name], MD  03/01/2023 2:40 PM    [facility] [address] [place], [place], [place]  [address] Phone: [phone]; Fax: [phone]   Note:  This document was prepared using Dragon voice recognition software and may include unintentional dictation errors.

## 2023-02-25 ENCOUNTER — Telehealth: Payer: Self-pay | Admitting: Internal Medicine

## 2023-02-25 NOTE — Telephone Encounter (Signed)
 Please address.

## 2023-02-25 NOTE — Telephone Encounter (Signed)
 Pt c/o medication issue:  1. Name of Medication:   ranolazine (RANEXA) 1000 MG SR tablet   2. How are you currently taking this medication (dosage and times per day)?   500 mg twice daily  3. Are you having a reaction (difficulty breathing--STAT)?   4. What is your medication issue?   Patient stated he needs his prescription to be 500 mg tablets and he cannot cut these tablet in half as they are time released.  Patient wants new prescription sent to [facility] - [place], [place] - [address]

## 2023-02-28 NOTE — Telephone Encounter (Signed)
 I called the patient to get clarification on request for Ranexa dose of 500 mg BID.   He said it should be in my file from Dr. Katrinka Blazing that I cannot tolerate 1000 mg.  [place] Nail him a terrible headache.  I asked him if they talked about this at the visit in Jan when Dr. Lynnette Caffey increased the dose to 1000 mg and he said they did not talk about increasing the dose. He has been taking the remainder of his 500 mg tablets and when he went to pick up his refill that's when he got the 1000mg  tablets.    I was going to send the 500 mg tablets for him, but he said no I will just talk with Dr. Lynnette Caffey about it tomorrow.     He said I doubt I can get the medication today because its snowing so bad in [place] - however, the patient was at drive through ordering breakfast during the call.

## 2023-03-01 ENCOUNTER — Ambulatory Visit: Payer: Medicare Other | Attending: Internal Medicine | Admitting: Internal Medicine

## 2023-03-01 ENCOUNTER — Encounter: Payer: Self-pay | Admitting: Internal Medicine

## 2023-03-01 VITALS — BP 130/70 | HR 74 | Ht 67.0 in | Wt 138.6 lb

## 2023-03-01 DIAGNOSIS — Z6822 Body mass index (BMI) 22.0-22.9, adult: Secondary | ICD-10-CM | POA: Insufficient documentation

## 2023-03-01 DIAGNOSIS — E785 Hyperlipidemia, unspecified: Secondary | ICD-10-CM | POA: Diagnosis present

## 2023-03-01 DIAGNOSIS — E1159 Type 2 diabetes mellitus with other circulatory complications: Secondary | ICD-10-CM | POA: Diagnosis not present

## 2023-03-01 DIAGNOSIS — E1169 Type 2 diabetes mellitus with other specified complication: Secondary | ICD-10-CM | POA: Diagnosis present

## 2023-03-01 DIAGNOSIS — I152 Hypertension secondary to endocrine disorders: Secondary | ICD-10-CM | POA: Diagnosis present

## 2023-03-01 DIAGNOSIS — Z794 Long term (current) use of insulin: Secondary | ICD-10-CM | POA: Diagnosis present

## 2023-03-01 DIAGNOSIS — E118 Type 2 diabetes mellitus with unspecified complications: Secondary | ICD-10-CM | POA: Insufficient documentation

## 2023-03-01 DIAGNOSIS — I5189 Other ill-defined heart diseases: Secondary | ICD-10-CM | POA: Diagnosis not present

## 2023-03-01 DIAGNOSIS — I209 Angina pectoris, unspecified: Secondary | ICD-10-CM | POA: Insufficient documentation

## 2023-03-01 MED ORDER — ISOSORBIDE MONONITRATE ER 120 MG PO TB24: 120.0000 mg | ORAL_TABLET | Freq: Every day | ORAL | 3 refills | Status: AC

## 2023-03-01 MED ORDER — RANOLAZINE ER 500 MG PO TB12: 500.0000 mg | ORAL_TABLET | Freq: Two times a day (BID) | ORAL | 3 refills | Status: AC

## 2023-03-01 NOTE — Patient Instructions (Addendum)
 Medication Instructions:  Your physician has recommended you make the following change in your medication:   1) CHANGE isosorbide mononitrate (Imdur) to 120 mg daily 2) DECREASE ranolazine (Ranexa) to 500 mg twice daily  **Please be sure you are taking furosemide (Lasix) 40 mg twice daily.**  *If you need a refill on your cardiac medications before your next appointment, please call your pharmacy*  Lab Work: TODAY: Lipid panel, LFTs If you have labs (blood work) drawn today and your tests are completely normal, you will receive your results only by: MyChart Message (if you have MyChart) OR A paper copy in the mail If you have any lab test that is abnormal or we need to change your treatment, we will call you to review the results.  Follow-Up: At [facility], you and your health needs are our priority.  As part of our continuing mission to provide you with exceptional heart care, we have created designated Provider Care Teams.  These Care Teams include your primary Cardiologist (physician) and Advanced Practice Providers (APPs -  Physician Assistants and Nurse Practitioners) who all work together to provide you with the care you need, when you need it.  Your next appointment:   3 month(s)  The format for your next appointment:   In Person  Provider:   [name], MD {  Other Instructions   1st Floor: - Lobby - Registration  - Pharmacy  - Lab - Cafe  2nd Floor: - PV Lab - Diagnostic Testing (echo, CT, nuclear med)  3rd Floor: - Vacant  4th Floor: - TCTS (cardiothoracic surgery) - AFib Clinic - Structural Heart Clinic - Vascular Surgery  - Vascular Ultrasound  5th Floor: - HeartCare Cardiology (general and EP) - Clinical Pharmacy for coumadin, hypertension, lipid, weight-loss medications, and med management appointments    Valet parking services will be available as well.

## 2023-03-02 ENCOUNTER — Encounter: Payer: Self-pay | Admitting: Internal Medicine

## 2023-03-02 LAB — HEPATIC FUNCTION PANEL
ALT: 14 IU/L (ref 0–44)
AST: 23 IU/L (ref 0–40)
Albumin: 5.1 g/dL — ABNORMAL HIGH (ref 3.9–4.9)
Alkaline Phosphatase: 84 IU/L (ref 44–121)
Bilirubin Total: 0.6 mg/dL (ref 0.0–1.2)
Bilirubin, Direct: 0.29 mg/dL (ref 0.00–0.40)
Total Protein: 7.1 g/dL (ref 6.0–8.5)

## 2023-03-02 LAB — LIPID PANEL
Chol/HDL Ratio: 2.6 ratio (ref 0.0–5.0)
Cholesterol, Total: 126 mg/dL (ref 100–199)
HDL: 49 mg/dL (ref >39)
LDL Chol Calc (NIH): 52 mg/dL (ref 0–99)
Triglycerides: 148 mg/dL (ref 0–149)
VLDL Cholesterol Cal: 25 mg/dL (ref 5–40)

## 2023-03-09 DIAGNOSIS — I4891 Unspecified atrial fibrillation: Secondary | ICD-10-CM | POA: Diagnosis not present

## 2023-03-11 NOTE — Progress Notes (Deleted)
 Office Visit    Patient Name: Terry Nelson Date of Encounter: 03/11/2023  PCP:  Jiles Harold, Cordelia Poche   [facility]  Cardiologist:  [name], MD  Advanced Practice Provider:  No care team member to display Electrophysiologist:  None   Chief Complaint    Terry Nelson is a 68 y.o. male with a hx of CAD s/p CABG in 2002 by Dr. Dorris Fetch, overlapping proximal to distal LAD stents with ISR, multiple subsequent balloon angioplasty procedures,, paroxysmal atrial fibrillation s/p ablation, DMII, hypertension, hyperlipemia, stage II chronic kidney disease presents today for hospital follow-up. He was considered for high risk orbital atherectomy/rotational atherectomy for poorly expanded previously placed 2 layer stent offered but at that time patient has not yet decided to move forward.   At his last appointment November 2022 he felt like the Ranexa was helping.His Ranexa was increased to 1,000mg  twice a day.  This dose resulted in upper and lower extremity weakness that resolved after reducing the dose to 500 mg twice daily.  At that time he was having progressive DOE requiring 10-12 nitroglycerin a day.  He also had COVID about 3 to 4 weeks prior with severe sinus congestion although this had resolved.  He then contacted our office on 01/23/2021 with chest pain related to not having Ranexa and this was refilled.  Unfortunately called again 01/30/2021 with recurrent angina and dyspnea and arrangements were made for direct admission.  He did have recurrent angina when walking from admitting upstairs.  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  There were no plans for repeat cardiac catheterization unless he had elevated enzymes or EKG changes given the nature of his disease.  He was noted to be volume overloaded on exam and treated with IV Lasix.  He was started on Farxiga right.  We continued his Lasix 40 mg daily and emphasized the need for daily weights at  home.  Last saw him February 2023, he presents to the office and is still having chest pain when he is up moving around and occasionally at rest.  He shares that they stopped his Eliquis when he was in the hospital.  He does have some associated shortness of breath.  He was started on Farxiga while in the hospital and a Bmp was performed which showed his creatinine bumped to 1.48.  He continues to take nitroglycerin tabs daily.  We discussed increasing his Imdur to help with his chest pain.  He also informs me that he was hypoglycemic in the hospital initially.  Although, he has been keeping track of his blood glucose level and they have been within normal range usually 90s fasting.  He does endorse having some lower extremity edema at times although this is much better than prior to his hospitalization.  Reports no palpitations.  He was seen in January of this year with unstable angina.  Recommended admission to the hospital for coronary angiography.  He was unable to be admitted to the hospital that day because he has a wife that requires his care.  He remained on extended DAPT.  Ultimately did undergo cardiac catheterization January 31 which found patent vein graft to the obtuse marginal LIMA to LAD was not imaged.  Diffuse right coronary artery disease with patent distal RCA stent.  Distal LAD occlusion unchanged from previous.  Recommendations were for high-dose Ranolazine and increase Lasix for elevated LVEDP.  Today, he *** Past Medical History    Past  Medical History:  Diagnosis Date   Acute diastolic heart failure (HCC)    Angina pectoris (HCC) 04/09/2019   Anxiety state    pt denies   Body mass index (bmi) 26.0-26.9, adult    CAD (coronary artery disease)    a. 2002 s/p CABG x 3 (LIMA->LAD, VG->OM1->OM2); b. s/p prior LAD and dRCA/RPDA stenting; c. 04/2019 PTCA & lithotripsy of ISR throughout LAD; d. 08/2020 PCI/scoring balloon PTCA throughout LAD 2/2 ISR; e. 09/2020 Cath: LM nl, LAD  40ost/p, 40p/d, 75d ISR (FFR 0.86), 50d, LCX small, OM1 90, OM2 25, RCA 20d ISR, RPDA 20 ISR, VG->OM1->OM2 ok, LIMA->LAD 168m-->Med rx.   CAD (coronary artery disease) of artery bypass graft 04/09/2019   CAD (coronary artery disease), native coronary artery 06/02/2021   Chronic heart failure with preserved ejection fraction (HFpEF) (HCC)    a. 09/2020 Echo: Ef 60-65%, no rwma, nl RV size/fxn. Mod dil LA. triv MR. Mild AoV sclerosis w/o stenosis.   CKD (chronic kidney disease), stage II    Coronary artery disease involving native coronary artery of native heart with unstable angina pectoris (HCC) 04/27/2019   DDD (degenerative disc disease), lumbar    DM (diabetes mellitus) (HCC)    Gastritis    GERD (gastroesophageal reflux disease)    Headache    Hemorrhage    History of kidney stones    Hyperlipidemia    Hyperlipidemia LDL goal <70 04/09/2019   Hypertension    Hypertensive urgency 03/12/2012   Kidney stones    Lumbago    Myocardial infarction [facility])    Neck pain    PAC (premature atrial contraction) 08/11/2020   PAF (paroxysmal atrial fibrillation) (HCC) 08/11/2020   Paroxysmal A-fib (HCC)    a. CHA2DS2VASc = 4-->Eliquis; b. 06/2020 Zio: No afib. Up to 20 beats PSVT.   Pernicious anemia    Unstable angina (HCC)    Past Surgical History:  Procedure Laterality Date   ABLATION  2017   CARDIAC CATHETERIZATION     CORONARY ARTERY BYPASS GRAFT     CORONARY BALLOON ANGIOPLASTY N/A 04/27/2019   Procedure: CORONARY BALLOON ANGIOPLASTY;  Surgeon: Yvonne Kendall, MD;  Location: [facility];  Service: Cardiovascular;  Laterality: N/A;   CORONARY BALLOON ANGIOPLASTY N/A 12/16/2019   Procedure: CORONARY BALLOON ANGIOPLASTY;  Surgeon: Lyn Records, MD;  Location: [facility];  Service: Cardiovascular;  Laterality: N/A;   CORONARY BALLOON ANGIOPLASTY N/A 09/06/2020   Procedure: CORONARY BALLOON ANGIOPLASTY;  Surgeon: Lyn Records, MD;  Location: [facility];  Service:  Cardiovascular;  Laterality: N/A;   CORONARY BALLOON ANGIOPLASTY N/A 06/02/2021   Procedure: CORONARY BALLOON ANGIOPLASTY;  Surgeon: Lyn Records, MD;  Location: [facility];  Service: Cardiovascular;  Laterality: N/A;   CORONARY BALLOON ANGIOPLASTY N/A 07/31/2022   Procedure: CORONARY BALLOON ANGIOPLASTY;  Surgeon: Kathleene Hazel, MD;  Location: [facility];  Service: Cardiovascular;  Laterality: N/A;   CORONARY PRESSURE/FFR STUDY N/A 09/30/2020   Procedure: INTRAVASCULAR PRESSURE WIRE/FFR STUDY;  Surgeon: [name], MD;  Location: [facility];  Service: Cardiovascular;  Laterality: N/A;   CORONARY PRESSURE/FFR STUDY N/A 06/02/2021   Procedure: INTRAVASCULAR PRESSURE WIRE/FFR STUDY;  Surgeon: Lyn Records, MD;  Location: [facility];  Service: Cardiovascular;  Laterality: N/A;   CORONARY ULTRASOUND/IVUS N/A 04/27/2019   Procedure: Intravascular Ultrasound/IVUS;  Surgeon: Yvonne Kendall, MD;  Location: [facility];  Service: Cardiovascular;  Laterality: N/A;   CORONARY ULTRASOUND/IVUS  N/A 12/16/2019   Procedure: Intravascular Ultrasound/IVUS;  Surgeon: Lyn Records, MD;  Location: [facility] INVASIVE CV LAB;  Service: Cardiovascular;  Laterality: N/A;   KNEE ARTHROSCOPY Bilateral    3 times on each knee   LEFT HEART CATH AND CORS/GRAFTS ANGIOGRAPHY N/A 04/09/2019   Procedure: LEFT HEART CATH AND CORS/GRAFTS ANGIOGRAPHY;  Surgeon: Lyn Records, MD;  Location: [facility];  Service: Cardiovascular;  Laterality: N/A;   LEFT HEART CATH AND CORS/GRAFTS ANGIOGRAPHY N/A 12/16/2019   Procedure: LEFT HEART CATH AND CORS/GRAFTS ANGIOGRAPHY;  Surgeon: Lyn Records, MD;  Location: [facility];  Service: Cardiovascular;  Laterality: N/A;   LEFT HEART CATH AND CORS/GRAFTS ANGIOGRAPHY N/A 09/06/2020   Procedure: LEFT HEART CATH AND CORS/GRAFTS ANGIOGRAPHY;  Surgeon: Lyn Records, MD;  Location: [facility];  Service: Cardiovascular;   Laterality: N/A;   LEFT HEART CATH AND CORS/GRAFTS ANGIOGRAPHY N/A 09/30/2020   Procedure: LEFT HEART CATH AND CORS/GRAFTS ANGIOGRAPHY;  Surgeon: [name], MD;  Location: [facility];  Service: Cardiovascular;  Laterality: N/A;   LEFT HEART CATH AND CORS/GRAFTS ANGIOGRAPHY N/A 06/02/2021   Procedure: LEFT HEART CATH AND CORS/GRAFTS ANGIOGRAPHY;  Surgeon: Lyn Records, MD;  [facility]: [facility];  Service: Cardiovascular;  Laterality: N/A;   LEFT HEART CATH AND CORS/GRAFTS ANGIOGRAPHY N/A 07/31/2022   Procedure: LEFT HEART CATH AND CORS/GRAFTS ANGIOGRAPHY;  Surgeon: Kathleene Hazel, MD;  Location: [facility];  Service: Cardiovascular;  Laterality: N/A;   LEFT HEART CATH AND CORS/GRAFTS ANGIOGRAPHY N/A 01/07/2023   Procedure: LEFT HEART CATH AND CORS/GRAFTS ANGIOGRAPHY;  Surgeon: Kathleene Hazel, MD;  Location: [facility];  Service: Cardiovascular;  Laterality: N/A;   LEFT HEART CATH AND CORS/GRAFTS ANGIOGRAPHY N/A 14-Feb-2023   Procedure: LEFT HEART CATH AND CORS/GRAFTS ANGIOGRAPHY;  Surgeon: [name], MD;  Location: [facility];  Service: Cardiovascular;  Laterality: N/A;   LUMBAR FUSION     L5-S1   LUMBAR LAMINECTOMY/DECOMPRESSION MICRODISCECTOMY N/A 04/13/2022   Procedure: LUMBAR THREE-FOUR LAMINECTOMY AND FORAMINOTOMY;  Surgeon: Dawley, Alan Mulder, DO;  Location: [facility];  Service: Neurosurgery;  Laterality: N/A;  3C   LUMBAR PERITONEAL SHUNT     pt states "this is blocked"   SHOULDER ARTHROSCOPY Bilateral     Allergies  Allergies  Allergen Reactions   Metoclopramide Other (See Comments)    DYSKINESIAS contractions of whole body   Zetia [Ezetimibe] Diarrhea   Penicillins Swelling and Rash         EKGs/Labs/Other Studies Reviewed:   Cardiac cath Feb 14, 2023  The following studies were reviewed today:  Left Anterior Descending  Ost LAD to Prox LAD lesion is 40% stenosed.  Mid LAD lesion is 50% stenosed. The  lesion was previously treated using a stent (unknown type) over 2 years ago.  Dist LAD lesion is 100% stenosed. The lesion is chronically occluded. The lesion was previously treated using a stent (unknown type) over 2 years ago.    Left Circumflex  Vessel is small.    First Obtuse Marginal Branch  1st Mrg lesion is 90% stenosed.    Second Obtuse Marginal Branch  2nd Mrg lesion is 25% stenosed.    Third Obtuse Marginal Branch  Vessel is small in size.    Right Coronary Artery  There is moderate diffuse disease throughout the vessel.  Mid RCA to Dist RCA lesion is 60% stenosed.  Dist RCA lesion is 20% stenosed.  The lesion was previously treated .    Right Posterior Descending Artery  There is moderate disease in the vessel.  RPDA lesion is 20% stenosed. The lesion was previously treated .    First Right Posterolateral Branch  Vessel is small in size.    Sequential Saphenous Graft To 1st Mrg, 2nd Mrg  SVG and is normal in caliber.    LIMA LIMA Graft To Mid LAD  LIMA. Known to be atretic  Mid Graft to Insertion lesion is 100% stenosed.    Intervention   No interventions have been documented.   Coronary Diagrams  Diagnostic Dominance: Right  Intervention   Echocardiogram 02/02/2021 IMPRESSIONS     1. Left ventricular ejection fraction, by estimation, is 60 to 65%. The  left ventricle has normal function. The left ventricle has no regional  wall motion abnormalities. There is mild left ventricular hypertrophy of  the basal-septal segment. Left  ventricular diastolic parameters are consistent with Grade III diastolic  dysfunction (restrictive). Elevated left ventricular end-diastolic  pressure.   2. Right ventricular systolic function is normal. The right ventricular  size is normal. Tricuspid regurgitation signal is inadequate for assessing  PA pressure.   3. Left atrial size was severely dilated.   4. The mitral valve is degenerative. Mild mitral valve  regurgitation. No  evidence of mitral stenosis.   5. The aortic valve is tricuspid. Aortic valve regurgitation is not  visualized. Aortic valve sclerosis is present, with no evidence of aortic  valve stenosis. Aortic valve Vmax measures 1.12 m/s.   6. The inferior vena cava is dilated in size with <50% respiratory  variability, suggesting right atrial pressure of 15 mmHg.   EKG:  EKG is not ordered today.   Recent Labs: 02/06/2023: Hemoglobin 14.8; Platelets 185 02/08/2023: BUN 15; Creatinine, Ser 0.96; Potassium 3.4; Sodium 140 03/01/2023: ALT 14  Recent Lipid Panel    Component Value Date/Time   CHOL 126 03/01/2023 1457   TRIG 148 03/01/2023 1457   HDL 49 03/01/2023 1457   CHOLHDL 2.6 03/01/2023 1457   CHOLHDL 2.4 02/02/2021 0358   VLDL 14 02/02/2021 0358   LDLCALC 52 03/01/2023 1457     Home Medications   No outpatient medications have been marked as taking for the 03/12/23 encounter (Appointment) with Sharlene Dory, PA-C.     Review of Systems      All other systems reviewed and are otherwise negative except as noted above.  Physical Exam    VS:  There were no vitals taken for this visit. , BMI There is no height or weight on file to calculate BMI.  Wt Readings from Last 3 Encounters:  03/01/23 138 lb 9.6 oz (62.9 kg)  02/08/23 140 lb (63.5 kg)  02/06/23 140 lb 6.4 oz (63.7 kg)     GEN: Well nourished, well developed, in no acute distress. HEENT: normal. Neck: Supple, no JVD, carotid bruits, or masses. Cardiac: RRR, no murmurs, rubs, or gallops. No clubbing, cyanosis, edema.  Radials/PT 2+ and equal bilaterally.  Respiratory:  Respirations regular and unlabored, clear to auscultation bilaterally. GI: Soft, nontender, nondistended. MS: No deformity or atrophy. Skin: Warm and dry, no rash. Neuro:  Strength and sensation are intact. Psych: Normal affect.  Assessment & Plan    Chest pain/CAD -Nitro helps, had several yesterday -Thought to be microvascular  disease -Increase Imdur to 180 mg daily, new prescription provided today  2. Acute on Chronic HFpEF -"Dry weight" 144 which is what he weighted this  morning at home -would not increase lasix at this time with elevated creatinine -BMP in 2 weeks -Continue Farxiga -Continue daily weights.  Being more than 2 to 3 pounds overnight or 5 pounds in a week please call our office  3. COVID 19 infection -no residual symptoms at this time  4. Anemia -last hemoglobin 10.1 -continue to monitor closely  5. Hx of PAF - inactive for a while now -Eliquis stopped due to anemia -Continue BB therapy  6. HTN -Well controlled today in the clinic -Encouraged him to continue taking his BP at home and recording -Encouraged a low-sodium, heart healthy diet  7. Hypoglycemia -occurred in the hospital -He has been monitoring his glucose level closely and he has not had another hypoglycemic event    Disposition: Follow up 1 month with [name], MD or APP.  Signed, Sharlene Dory, PA-C 03/11/2023, 8:54 PM [facility]

## 2023-03-12 ENCOUNTER — Ambulatory Visit: Admitting: Physician Assistant

## 2023-03-12 DIAGNOSIS — Z79899 Other long term (current) drug therapy: Secondary | ICD-10-CM

## 2023-03-12 DIAGNOSIS — E785 Hyperlipidemia, unspecified: Secondary | ICD-10-CM

## 2023-03-12 DIAGNOSIS — E1159 Type 2 diabetes mellitus with other circulatory complications: Secondary | ICD-10-CM

## 2023-03-12 DIAGNOSIS — E118 Type 2 diabetes mellitus with unspecified complications: Secondary | ICD-10-CM

## 2023-03-12 DIAGNOSIS — I5031 Acute diastolic (congestive) heart failure: Secondary | ICD-10-CM

## 2023-03-12 DIAGNOSIS — I200 Unstable angina: Secondary | ICD-10-CM

## 2023-03-12 DIAGNOSIS — I209 Angina pectoris, unspecified: Secondary | ICD-10-CM

## 2023-03-12 DIAGNOSIS — I5189 Other ill-defined heart diseases: Secondary | ICD-10-CM

## 2023-03-18 ENCOUNTER — Encounter: Payer: Self-pay | Admitting: Cardiology

## 2023-03-28 NOTE — Progress Notes (Unsigned)
 Office Visit    Patient Name: Terry Nelson Date of Encounter: 03/29/2023  PCP:  Jiles Harold, Cordelia Poche   [facility]  Cardiologist:  [name], MD  Advanced Practice Provider:  No care team member to display Electrophysiologist:  None   Chief Complaint    AILTON VALLEY is a 68 y.o. male with a hx of CAD s/p CABG in 2002 by Dr. Dorris Fetch, overlapping proximal to distal LAD stents with ISR, multiple subsequent balloon angioplasty procedures,, paroxysmal atrial fibrillation s/p ablation, DMII, hypertension, hyperlipemia, stage II chronic kidney disease presents today for hospital follow-up. He was considered for high risk orbital atherectomy/rotational atherectomy for poorly expanded previously placed 2 layer stent offered but at that time patient has not yet decided to move forward.   At his last appointment November 2022 he felt like the Ranexa was helping.His Ranexa was increased to 1,000mg  twice a day.  This dose resulted in upper and lower extremity weakness that resolved after reducing the dose to 500 mg twice daily.  At that time he was having progressive DOE requiring 10-12 nitroglycerin a day.  He also had COVID about 3 to 4 weeks prior with severe sinus congestion although this had resolved.  He then contacted our office on 01/23/2021 with chest pain related to not having Ranexa and this was refilled.  Unfortunately called again 01/30/2021 with recurrent angina and dyspnea and arrangements were made for direct admission.  He did have recurrent angina when walking from admitting upstairs.  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  There were no plans for repeat cardiac catheterization unless he had elevated enzymes or EKG changes given the nature of his disease.  He was noted to be volume overloaded on exam and treated with IV Lasix.  He was started on Farxiga right.  We continued his Lasix 40 mg daily and emphasized the need for daily weights  at home.  Last saw him February 2023, he presents to the office and is still having chest pain when he is up moving around and occasionally at rest.  He shares that they stopped his Eliquis when he was in the hospital.  He does have some associated shortness of breath.  He was started on Farxiga while in the hospital and a Bmp was performed which showed his creatinine bumped to 1.48.  He continues to take nitroglycerin tabs daily.  We discussed increasing his Imdur to help with his chest pain.  He also informs me that he was hypoglycemic in the hospital initially.  Although, he has been keeping track of his blood glucose level and they have been within normal range usually 90s fasting.  He does endorse having some lower extremity edema at times although this is much better than prior to his hospitalization.  Reports no palpitations.  He was seen in January of this year with unstable angina.  Recommended admission to the hospital for coronary angiography.  He was unable to be admitted to the hospital that day because he has a wife that requires his care.  He remained on extended DAPT.  Ultimately did undergo cardiac catheterization January 31 which found patent vein graft to the obtuse marginal LIMA to LAD was not imaged.  Diffuse right coronary artery disease with patent distal RCA stent.  Distal LAD occlusion unchanged from previous.  Recommendations were for high-dose Ranolazine and increase Lasix for elevated LVEDP.  Today, he presents with a history of atrial fibrillation and  a previous ablation, presents for a follow-up after a recent hospitalization for chest pain and a racing heartbeat. He was admitted to the ICU for three days at [facility]. The patient reports that he collapsed in his driveway due to severe chest pain and difficulty breathing, which he likened to a previous heart attack. He was transported to the hospital by ambulance, where he was given Cardizem and another unknown  medication to slow his heart rate.  During his hospital stay, the patient was switched from extended-release metoprolol to short-acting metoprolol, which he now takes 100mg  twice daily. Since the switch, he has not experienced any significant issues with his heart rate. He did report a brief episode of a fast heartbeat, but it resolved quickly and was not associated with any pain.  The patient also has a history of diabetes and is currently taking glipizide 10mg  twice daily. He previously took [place], but it was discontinued due to cost. His blood sugars have been well-controlled on the current regimen.  In addition, the patient is on spironolactone 25mg  and has not experienced any issues with swelling in his feet. He also takes Effient and aspirin, which he has been on for the past twenty years.  Reports no shortness of breath nor dyspnea on exertion. Reports no chest pain, pressure, or tightness. No edema, orthopnea, PND. Reports no palpitations.   Discussed the use of AI scribe software for clinical note transcription with the patient, who gave verbal consent to proceed.    Past Medical History    Past Medical History:  Diagnosis Date   Acute diastolic heart failure (HCC)    Angina pectoris (HCC) 04/09/2019   Anxiety state    pt denies   Body mass index (bmi) 26.0-26.9, adult    CAD (coronary artery disease)    a. 2002 s/p CABG x 3 (LIMA->LAD, VG->OM1->OM2); b. s/p prior LAD and dRCA/RPDA stenting; c. 04/2019 PTCA & lithotripsy of ISR throughout LAD; d. 08/2020 PCI/scoring balloon PTCA throughout LAD 2/2 ISR; e. 09/2020 Cath: LM nl, LAD 40ost/p, 40p/d, 75d ISR (FFR 0.86), 50d, LCX small, OM1 90, OM2 25, RCA 20d ISR, RPDA 20 ISR, VG->OM1->OM2 ok, LIMA->LAD 144m-->Med rx.   CAD (coronary artery disease) of artery bypass graft 04/09/2019   CAD (coronary artery disease), native coronary artery 06/02/2021   Chronic heart failure with preserved ejection fraction (HFpEF) (HCC)    a. 09/2020  Echo: Ef 60-65%, no rwma, nl RV size/fxn. Mod dil LA. triv MR. Mild AoV sclerosis w/o stenosis.   CKD (chronic kidney disease), stage II    Coronary artery disease involving native coronary artery of native heart with unstable angina pectoris (HCC) 04/27/2019   DDD (degenerative disc disease), lumbar    DM (diabetes mellitus) (HCC)    Gastritis    GERD (gastroesophageal reflux disease)    Headache    Hemorrhage    History of kidney stones    Hyperlipidemia    Hyperlipidemia LDL goal <70 04/09/2019   Hypertension    Hypertensive urgency 03/12/2012   Kidney stones    Lumbago    Myocardial infarction [facility])    Neck pain    PAC (premature atrial contraction) 08/11/2020   PAF (paroxysmal atrial fibrillation) (HCC) 08/11/2020   Paroxysmal A-fib (HCC)    a. CHA2DS2VASc = 4-->Eliquis; b. 06/2020 Zio: No afib. Up to 20 beats PSVT.   Pernicious anemia    Unstable angina [facility])    Past Surgical History:  Procedure Laterality Date   ABLATION  2017  CARDIAC CATHETERIZATION     CORONARY ARTERY BYPASS GRAFT     CORONARY BALLOON ANGIOPLASTY N/A 04/27/2019   Procedure: CORONARY BALLOON ANGIOPLASTY;  Surgeon: Yvonne Kendall, MD;  Location: [facility] INV[facility];  Service: Cardiovascular;  Laterality: N/A;   CORONARY BALLOON ANGIOPLASTY N/A 12/16/2019   Procedure: CORONARY BALLOON ANGIOPLASTY;  Surgeon: Lyn Records, MD;  Location: [facility];  Service: Cardiovascular;  Laterality: N/A;   CORONARY BALLOON ANGIOPLASTY N/A 09/06/2020   Procedure: CORONARY BALLOON ANGIOPLASTY;  Surgeon: Lyn Records, MD;  [facility]: [facility];  Service: Cardiovascular;  Laterality: N/A;   CORONARY BALLOON ANGIOPLASTY N/A 06/02/2021   Procedure: CORONARY BALLOON ANGIOPLASTY;  Surgeon: Lyn Records, MD;  [facility]: [facility];  Service: Cardiovascular;  Laterality: N/A;   CORONARY BALLOON ANGIOPLASTY N/A 07/31/2022   Procedure: CORONARY BALLOON ANGIOPLASTY;  Surgeon: Kathleene Hazel,  MD;  [facility]: [facility];  Service: Cardiovascular;  Laterality: N/A;   CORONARY PRESSURE/FFR STUDY N/A 09/30/2020   Procedure: INTRAVASCULAR PRESSURE WIRE/FFR STUDY;  Surgeon: [name], MD;  [facility]: [facility];  Service: Cardiovascular;  Laterality: N/A;   CORONARY PRESSURE/FFR STUDY N/A 06/02/2021   Procedure: INTRAVASCULAR PRESSURE WIRE/FFR STUDY;  Surgeon: Lyn Records, MD;  [facility]: [facility];  Service: Cardiovascular;  Laterality: N/A;   CORONARY ULTRASOUND/IVUS N/A 04/27/2019   Procedure: Intravascular Ultrasound/IVUS;  Surgeon: Yvonne Kendall, MD;  [facility];  Service: Cardiovascular;  Laterality: N/A;   CORONARY ULTRASOUND/IVUS N/A 12/16/2019   Procedure: Intravascular Ultrasound/IVUS;  Surgeon: Lyn Records, MD;  Location[facility] CV LAB;  Service: Cardiovascular;  Laterality: N/A;   KNEE ARTHROSCOPY Bilateral    3 times on each knee   LEFT HEART CATH AND CORS/GRAFTS ANGIOGRAPHY N/A 04/09/2019   Procedure: LEFT HEART CATH AND CORS/GRAFTS ANGIOGRAPHY;  Surgeon: Lyn Records, MD;  [facility];  Service: Cardiovascular;  Laterality: N/A;   LEFT HEART CATH AND CORS/GRAFTS ANGIOGRAPHY N/A 12/16/2019   Procedure: LEFT HEART CATH AND CORS/GRAFTS ANGIOGRAPHY;  Surgeon: Lyn Records, MD;  [facility];  Service: Cardiovascular;  Laterality: N/A;   LEFT HEART CATH AND CORS/GRAFTS ANGIOGRAPHY N/A 09/06/2020   Procedure: LEFT HEART CATH AND CORS/GRAFTS ANGIOGRAPHY;  Surgeon: Lyn Records, MD;  [facility];  Service: Cardiovascular;  Laterality: N/A;   LEFT HEART CATH AND CORS/GRAFTS ANGIOGRAPHY N/A 09/30/2020   Procedure: LEFT HEART CATH AND CORS/GRAFTS ANGIOGRAPHY;  Surgeon: [name], MD;  [facility];  Service: Cardiovascular;  Laterality: N/A;   LEFT HEART CATH AND CORS/GRAFTS ANGIOGRAPHY N/A 06/02/2021   Procedure: LEFT HEART CATH AND CORS/GRAFTS  ANGIOGRAPHY;  Surgeon: Lyn Records, MD;  [facility];  Service: Cardiovascular;  Laterality: N/A;   LEFT HEART CATH AND CORS/GRAFTS ANGIOGRAPHY N/A 07/31/2022   Procedure: LEFT HEART CATH AND CORS/GRAFTS ANGIOGRAPHY;  Surgeon: Kathleene Hazel, MD;  [facility];  Service: Cardiovascular;  Laterality: N/A;   LEFT HEART CATH AND CORS/GRAFTS ANGIOGRAPHY N/A 01/07/2023   Procedure: LEFT HEART CATH AND CORS/GRAFTS ANGIOGRAPHY;  Surgeon: Kathleene Hazel, MD;  [facility];  Service: Cardiovascular;  Laterality: N/A;   LEFT HEART CATH AND CORS/GRAFTS ANGIOGRAPHY N/A 02/08/2023   Procedure: LEFT HEART CATH AND CORS/GRAFTS ANGIOGRAPHY;  Surgeon: [name], MD;  [facility];  Service: Cardiovascular;  Laterality: N/A;  LUMBAR FUSION     L5-S1   LUMBAR LAMINECTOMY/DECOMPRESSION MICRODISCECTOMY N/A 04/13/2022   Procedure: LUMBAR THREE-FOUR LAMINECTOMY AND FORAMINOTOMY;  Surgeon: Dawley, Alan Mulder, DO;  Location: [facility];  Service: Neurosurgery;  Laterality: N/A;  3C   LUMBAR PERITONEAL SHUNT     pt states "this is blocked"   SHOULDER ARTHROSCOPY Bilateral     Allergies  Allergies  Allergen Reactions   Metoclopramide Other (See Comments)    DYSKINESIAS contractions of whole body   Zetia [Ezetimibe] Diarrhea   Penicillins Swelling and Rash         EKGs/Labs/Other Studies Reviewed:   Cardiac cath February 10, 2023  The following studies were reviewed today:  Left Anterior Descending  Ost LAD to Prox LAD lesion is 40% stenosed.  Mid LAD lesion is 50% stenosed. The lesion was previously treated using a stent (unknown type) over 2 years ago.  Dist LAD lesion is 100% stenosed. The lesion is chronically occluded. The lesion was previously treated using a stent (unknown type) over 2 years ago.    Left Circumflex  Vessel is small.    First Obtuse Marginal Branch  1st Mrg lesion is 90% stenosed.    Second Obtuse  Marginal Branch  2nd Mrg lesion is 25% stenosed.    Third Obtuse Marginal Branch  Vessel is small in size.    Right Coronary Artery  There is moderate diffuse disease throughout the vessel.  Mid RCA to Dist RCA lesion is 60% stenosed.  Dist RCA lesion is 20% stenosed. The lesion was previously treated .    Right Posterior Descending Artery  There is moderate disease in the vessel.  RPDA lesion is 20% stenosed. The lesion was previously treated .    First Right Posterolateral Branch  Vessel is small in size.    Sequential Saphenous Graft To 1st Mrg, 2nd Mrg  SVG and is normal in caliber.    LIMA LIMA Graft To Mid LAD  LIMA. Known to be atretic  Mid Graft to Insertion lesion is 100% stenosed.    Intervention   No interventions have been documented.   Coronary Diagrams  Diagnostic Dominance: Right  Intervention   Echocardiogram 02/02/2021 IMPRESSIONS     1. Left ventricular ejection fraction, by estimation, is 60 to 65%. The  left ventricle has normal function. The left ventricle has no regional  wall motion abnormalities. There is mild left ventricular hypertrophy of  the basal-septal segment. Left  ventricular diastolic parameters are consistent with Grade III diastolic  dysfunction (restrictive). Elevated left ventricular end-diastolic  pressure.   2. Right ventricular systolic function is normal. The right ventricular  size is normal. Tricuspid regurgitation signal is inadequate for assessing  PA pressure.   3. Left atrial size was severely dilated.   4. The mitral valve is degenerative. Mild mitral valve regurgitation. No  evidence of mitral stenosis.   5. The aortic valve is tricuspid. Aortic valve regurgitation is not  visualized. Aortic valve sclerosis is present, with no evidence of aortic  valve stenosis. Aortic valve Vmax measures 1.12 m/s.   6. The inferior vena cava is dilated in size with <50% respiratory  variability, suggesting right atrial  pressure of 15 mmHg.   EKG:  EKG is not ordered today.   Recent Labs: 02/06/2023: Hemoglobin 14.8; Platelets 185 2023/02/10: BUN 15; Creatinine, Ser 0.96; Potassium 3.4; Sodium 140 03/01/2023: ALT 14  Recent Lipid Panel    Component Value Date/Time   CHOL 126 03/01/2023 1457  TRIG 148 03/01/2023 1457   HDL 49 03/01/2023 1457   CHOLHDL 2.6 03/01/2023 1457   CHOLHDL 2.4 02/02/2021 0358   VLDL 14 02/02/2021 0358   LDLCALC 52 03/01/2023 1457     Home Medications   Current Meds  Medication Sig   acetaminophen (TYLENOL) 500 MG tablet Take 1,000 mg by mouth every 6 (six) hours as needed for mild pain (pain score 1-3), moderate pain (pain score 4-6) or headache.   aspirin EC 81 MG tablet Take 1 tablet (81 mg total) by mouth daily. Swallow whole.   atorvastatin (LIPITOR) 80 MG tablet Take 80 mg by mouth at bedtime.   Blood Glucose Monitoring Suppl (CONTOUR NEXT MONITOR) w/Device KIT daily.   Continuous Glucose Sensor (FREESTYLE LIBRE 3 SENSOR) MISC USE TO TEST BLOOD SUGAR AND CHANGE EVERY 14 DAYS AS DIRECTED   furosemide (LASIX) 40 MG tablet Take 1 tablet (40 mg total) by mouth 2 (two) times daily.   gabapentin (NEURONTIN) 300 MG capsule Take 1,200-1,500 mg by mouth See admin instructions. Take 1200 mg in the morning and 1500 mg at bedtime   glipiZIDE (GLUCOTROL) 10 MG tablet Take 10 mg by mouth 2 (two) times daily.   Insulin Pen Needle (BD PEN NEEDLE NANO U/F) 32G X 4 MM MISC USE ONCE AS DIRECTED   isosorbide mononitrate (IMDUR) 120 MG 24 hr tablet Take 1 tablet (120 mg total) by mouth daily.   losartan (COZAAR) 25 MG tablet Take 0.5 tablets (12.5 mg total) by mouth daily.   nitroGLYCERIN (NITROSTAT) 0.4 MG SL tablet Place 1 tablet (0.4 mg total) under the tongue every 5 (five) minutes as needed for chest pain.   oxyCODONE-acetaminophen (PERCOCET) 10-325 MG tablet Take 1 tablet by mouth every 4 (four) hours as needed for pain.   pantoprazole (PROTONIX) 40 MG tablet Take 40 mg by mouth  every morning.   potassium chloride (KLOR-CON M) 10 MEQ tablet Take 1 tablet (10 mEq total) by mouth daily.   prasugrel (EFFIENT) 10 MG TABS tablet Take 1 tablet (10 mg total) by mouth every morning.   promethazine (PHENERGAN) 25 MG tablet Take 25 mg by mouth 2 (two) times daily as needed for nausea or vomiting (Headache).   ranolazine (RANEXA) 500 MG 12 hr tablet Take 1 tablet (500 mg total) by mouth 2 (two) times daily.   spironolactone (ALDACTONE) 25 MG tablet Take 25 mg by mouth daily.   tiZANidine (ZANAFLEX) 4 MG tablet Take 4-8 mg by mouth See admin instructions. Take 4 mg in the morning, lunch ,and evening meal and 8 mg at bedtime   TOUJEO SOLOSTAR 300 UNIT/ML Solostar Pen Inject 30 Units into the skin daily.   traZODone (DESYREL) 50 MG tablet Take 50 mg by mouth at bedtime.   [DISCONTINUED] metoprolol tartrate (LOPRESSOR) 100 MG tablet Take 100 mg by mouth 2 (two) times daily.     Review of Systems      All other systems reviewed and are otherwise negative except as noted above.  Physical Exam    VS:  BP 134/68   Pulse 67   Ht 5\' 7"  (1.702 m)   Wt 147 lb 9.6 oz (67 kg)   SpO2 97%   BMI 23.12 kg/m  , BMI Body mass index is 23.12 kg/m.  Wt Readings from Last 3 Encounters:  03/29/23 147 lb 9.6 oz (67 kg)  03/01/23 138 lb 9.6 oz (62.9 kg)  02/08/23 140 lb (63.5 kg)     GEN: Well nourished, well developed,  in no acute distress. HEENT: normal. Neck: Supple, no JVD, carotid bruits, or masses. Cardiac: RRR, no murmurs, rubs, or gallops. No clubbing, cyanosis, edema.  Radials/PT 2+ and equal bilaterally.  Respiratory:  Respirations regular and unlabored, clear to auscultation bilaterally. GI: Soft, nontender, nondistended. MS: No deformity or atrophy. Skin: Warm and dry, no rash. Neuro:  Strength and sensation are intact. Psych: Normal affect.  Assessment & Plan    Atrial Fibrillation Atrial fibrillation post-ablation, controlled with metoprolol tartrate. No recent rapid  heart rate or chest pain. - Continue metoprolol tartrate 100 mg twice daily.  Coronary Artery Disease Stable coronary artery disease, no new symptoms post-catheterization. - Continue Effient and aspirin as prescribed.  Hypertension Hypertension well-controlled with current regimen. - Continue Endur and spironolactone as prescribed.  Type 2 Diabetes Mellitus Type 2 diabetes well-controlled, A1c 6.2. - Continue glipizide 10 mg twice daily. - Monitor blood glucose levels regularly.  Follow-up Pending echocardiogram results from [facility]. - Schedule follow-up with Dr. Lynnette Caffey in a couple of months. - Ensure echocardiogram results are reviewed.   Disposition: Follow up 3 month with [name], MD or APP.  Signed, Sharlene Dory, PA-C 03/29/2023, 12:44 PM  [facility]

## 2023-03-29 ENCOUNTER — Encounter: Payer: Self-pay | Admitting: Physician Assistant

## 2023-03-29 ENCOUNTER — Ambulatory Visit: Attending: Physician Assistant | Admitting: Physician Assistant

## 2023-03-29 VITALS — BP 134/68 | HR 67 | Ht 67.0 in | Wt 147.6 lb

## 2023-03-29 DIAGNOSIS — I200 Unstable angina: Secondary | ICD-10-CM | POA: Diagnosis not present

## 2023-03-29 DIAGNOSIS — I152 Hypertension secondary to endocrine disorders: Secondary | ICD-10-CM | POA: Insufficient documentation

## 2023-03-29 DIAGNOSIS — Z794 Long term (current) use of insulin: Secondary | ICD-10-CM | POA: Insufficient documentation

## 2023-03-29 DIAGNOSIS — E1159 Type 2 diabetes mellitus with other circulatory complications: Secondary | ICD-10-CM | POA: Diagnosis not present

## 2023-03-29 DIAGNOSIS — E1169 Type 2 diabetes mellitus with other specified complication: Secondary | ICD-10-CM

## 2023-03-29 DIAGNOSIS — Z6822 Body mass index (BMI) 22.0-22.9, adult: Secondary | ICD-10-CM

## 2023-03-29 DIAGNOSIS — I480 Paroxysmal atrial fibrillation: Secondary | ICD-10-CM | POA: Diagnosis present

## 2023-03-29 DIAGNOSIS — E785 Hyperlipidemia, unspecified: Secondary | ICD-10-CM | POA: Insufficient documentation

## 2023-03-29 DIAGNOSIS — E118 Type 2 diabetes mellitus with unspecified complications: Secondary | ICD-10-CM

## 2023-03-29 DIAGNOSIS — I5189 Other ill-defined heart diseases: Secondary | ICD-10-CM

## 2023-03-29 MED ORDER — METOPROLOL TARTRATE 100 MG PO TABS: 100.0000 mg | ORAL_TABLET | Freq: Two times a day (BID) | ORAL | 3 refills | Status: AC

## 2023-03-29 NOTE — Patient Instructions (Signed)
 Medication Instructions:  Your physician recommends that you continue on your current medications as directed. Please refer to the Current Medication list given to you today.  *If you need a refill on your cardiac medications before your next appointment, please call your pharmacy*   Lab Work: NONE If you have labs (blood work) drawn today and your tests are completely normal, you will receive your results only by: MyChart Message (if you have MyChart) OR A paper copy in the mail If you have any lab test that is abnormal or we need to change your treatment, we will call you to review the results.   Testing/Procedures: NONE   Follow-Up: At [facility], you and your health needs are our priority.  As part of our continuing mission to provide you with exceptional heart care, we have created designated Provider Care Teams.  These Care Teams include your primary Cardiologist (physician) and Advanced Practice Providers (APPs -  Physician Assistants and Nurse Practitioners) who all work together to provide you with the care you need, when you need it.  We recommend signing up for the patient portal called "MyChart".  Sign up information is provided on this After Visit Summary.  MyChart is used to connect with patients for Virtual Visits (Telemedicine).  Patients are able to view lab/test results, encounter notes, upcoming appointments, etc.  Non-urgent messages can be sent to your provider as well.   To learn more about what you can do with MyChart, go to [email or web address].    Your next appointment:   3 month(s)  Provider:   [name], MD     Other Instructions   1st Floor: - Lobby - Registration  - Pharmacy  - Lab - Cafe  2nd Floor: - PV Lab - Diagnostic Testing (echo, CT, nuclear med)  3rd Floor: - Vacant  4th Floor: - TCTS (cardiothoracic surgery) - AFib Clinic - Structural Heart Clinic - Vascular Surgery  - Vascular Ultrasound  5th Floor: -  HeartCare Cardiology (general and EP) - Clinical Pharmacy for coumadin, hypertension, lipid, weight-loss medications, and med management appointments    Valet parking services will be available as well.

## 2023-04-02 ENCOUNTER — Encounter (HOSPITAL_COMMUNITY): Payer: Self-pay | Admitting: Emergency Medicine

## 2023-04-02 ENCOUNTER — Other Ambulatory Visit: Payer: Self-pay

## 2023-04-02 ENCOUNTER — Observation Stay (HOSPITAL_COMMUNITY)
Admission: EM | Admit: 2023-04-02 | Discharge: 2023-04-03 | Disposition: A | Discharge status: Home / Self Care | Attending: Internal Medicine | Admitting: Internal Medicine

## 2023-04-02 ENCOUNTER — Emergency Department (HOSPITAL_COMMUNITY)

## 2023-04-02 DIAGNOSIS — N182 Chronic kidney disease, stage 2 (mild): Secondary | ICD-10-CM | POA: Diagnosis not present

## 2023-04-02 DIAGNOSIS — Z955 Presence of coronary angioplasty implant and graft: Secondary | ICD-10-CM | POA: Diagnosis not present

## 2023-04-02 DIAGNOSIS — R079 Chest pain, unspecified: Secondary | ICD-10-CM | POA: Diagnosis present

## 2023-04-02 DIAGNOSIS — I25110 Atherosclerotic heart disease of native coronary artery with unstable angina pectoris: Principal | ICD-10-CM | POA: Insufficient documentation

## 2023-04-02 DIAGNOSIS — Z79899 Other long term (current) drug therapy: Secondary | ICD-10-CM | POA: Insufficient documentation

## 2023-04-02 DIAGNOSIS — I2089 Other forms of angina pectoris: Principal | ICD-10-CM

## 2023-04-02 DIAGNOSIS — I480 Paroxysmal atrial fibrillation: Secondary | ICD-10-CM | POA: Insufficient documentation

## 2023-04-02 DIAGNOSIS — Z87891 Personal history of nicotine dependence: Secondary | ICD-10-CM | POA: Diagnosis not present

## 2023-04-02 DIAGNOSIS — I10 Essential (primary) hypertension: Secondary | ICD-10-CM | POA: Diagnosis present

## 2023-04-02 DIAGNOSIS — Z7984 Long term (current) use of oral hypoglycemic drugs: Secondary | ICD-10-CM | POA: Diagnosis not present

## 2023-04-02 DIAGNOSIS — E1122 Type 2 diabetes mellitus with diabetic chronic kidney disease: Secondary | ICD-10-CM | POA: Diagnosis not present

## 2023-04-02 DIAGNOSIS — I130 Hypertensive heart and chronic kidney disease with heart failure and stage 1 through stage 4 chronic kidney disease, or unspecified chronic kidney disease: Secondary | ICD-10-CM | POA: Insufficient documentation

## 2023-04-02 DIAGNOSIS — Z794 Long term (current) use of insulin: Secondary | ICD-10-CM | POA: Diagnosis not present

## 2023-04-02 DIAGNOSIS — I200 Unstable angina: Secondary | ICD-10-CM | POA: Diagnosis present

## 2023-04-02 DIAGNOSIS — Z7982 Long term (current) use of aspirin: Secondary | ICD-10-CM | POA: Insufficient documentation

## 2023-04-02 DIAGNOSIS — E785 Hyperlipidemia, unspecified: Secondary | ICD-10-CM | POA: Insufficient documentation

## 2023-04-02 DIAGNOSIS — I5032 Chronic diastolic (congestive) heart failure: Secondary | ICD-10-CM | POA: Diagnosis not present

## 2023-04-02 DIAGNOSIS — Z951 Presence of aortocoronary bypass graft: Secondary | ICD-10-CM | POA: Insufficient documentation

## 2023-04-02 LAB — CBC WITH DIFFERENTIAL/PLATELET
Abs Immature Granulocytes: 0.06 10*3/uL (ref 0.00–0.07)
Basophils Absolute: 0 10*3/uL (ref 0.0–0.1)
Basophils Relative: 0 %
Eosinophils Absolute: 0.1 10*3/uL (ref 0.0–0.5)
Eosinophils Relative: 1 %
HCT: 40 % (ref 39.0–52.0)
Hemoglobin: 13.4 g/dL (ref 13.0–17.0)
Immature Granulocytes: 1 %
Lymphocytes Relative: 15 %
Lymphs Abs: 1.4 10*3/uL (ref 0.7–4.0)
MCH: 33.5 pg (ref 26.0–34.0)
MCHC: 33.5 g/dL (ref 30.0–36.0)
MCV: 100 fL (ref 80.0–100.0)
Monocytes Absolute: 0.7 10*3/uL (ref 0.1–1.0)
Monocytes Relative: 8 %
Neutro Abs: 7.1 10*3/uL (ref 1.7–7.7)
Neutrophils Relative %: 75 %
Platelets: 179 10*3/uL (ref 150–400)
RBC: 4 MIL/uL — ABNORMAL LOW (ref 4.22–5.81)
RDW: 13.1 % (ref 11.5–15.5)
WBC: 9.4 10*3/uL (ref 4.0–10.5)
nRBC: 0 % (ref 0.0–0.2)

## 2023-04-02 LAB — CBG MONITORING, ED: Glucose-Capillary: 106 mg/dL — ABNORMAL HIGH (ref 70–99)

## 2023-04-02 LAB — COMPREHENSIVE METABOLIC PANEL
ALT: 17 U/L (ref 0–44)
AST: 22 U/L (ref 15–41)
Albumin: 4.4 g/dL (ref 3.5–5.0)
Alkaline Phosphatase: 78 U/L (ref 38–126)
Anion gap: 12 (ref 5–15)
BUN: 19 mg/dL (ref 8–23)
CO2: 24 mmol/L (ref 22–32)
Calcium: 9.6 mg/dL (ref 8.9–10.3)
Chloride: 104 mmol/L (ref 98–111)
Creatinine, Ser: 1.16 mg/dL (ref 0.61–1.24)
GFR, Estimated: >60 mL/min (ref >60)
Glucose, Bld: 121 mg/dL — ABNORMAL HIGH (ref 70–99)
Potassium: 3.7 mmol/L (ref 3.5–5.1)
Sodium: 140 mmol/L (ref 135–145)
Total Bilirubin: 0.8 mg/dL (ref 0.0–1.2)
Total Protein: 7.1 g/dL (ref 6.5–8.1)

## 2023-04-02 LAB — TROPONIN I (HIGH SENSITIVITY): Troponin I (High Sensitivity): 13 ng/L (ref <18)

## 2023-04-02 MED ORDER — NITROGLYCERIN IN D5W 200-5 MCG/ML-% IV SOLN
0.0000 ug/min | INTRAVENOUS | Status: DC
  Administered 2023-04-03: 5 ug/min via INTRAVENOUS
  Filled 2023-04-02: qty 250

## 2023-04-02 MED ORDER — HYDROMORPHONE HCL 1 MG/ML IJ SOLN
1.0000 mg | Freq: Once | INTRAMUSCULAR | Status: AC
  Administered 2023-04-03: 1 mg via INTRAVENOUS
  Filled 2023-04-02: qty 1

## 2023-04-02 MED ORDER — MORPHINE SULFATE (PF) 4 MG/ML IV SOLN
4.0000 mg | Freq: Once | INTRAVENOUS | Status: AC
  Administered 2023-04-02: 4 mg via INTRAVENOUS
  Filled 2023-04-02: qty 1

## 2023-04-02 NOTE — ED Triage Notes (Signed)
 BIB [employer]. Pt started having chest pain at apporx 2000 this evenining. Pt has Hx of 22 stents. Pt had a total of 4 0.4 dl nitroglycerin tabs. Pt also has nitroglycerin past to left chest, as well as 4 mg Zofran. 100 mcg Fentanyl. Pt was found with BG to be 55 and oral glucose was given with a final BG of 92.

## 2023-04-02 NOTE — ED Provider Notes (Signed)
 [place] EMERGENCY DEPARTMENT AT [facility] Provider Note   CSN: [id number] Arrival date & time: 04/02/23  2148     History  Chief Complaint  Patient presents with   Chest Pain    LONZA SHIMABUKURO is a 68 y.o. male with PMHx CAD, paroxysmal afib, HLD, CHF, CKD, DM, GERD, headaches, HTN, who presents to ED concerned for increased intermittent episodes of chest pain x2 weeks that worsened in severity around 8PM tonight. Patient stating that chest pain was resolving with NTG, but this episode of chest pain tonight is refractory to multiple rounds of NTG. Patient also stating that chest pain tonight feels different as it radiates down left arm.  Denies recent infectious symptoms such as fever, cough, vomiting, diarrhea.   Chest Pain      Home Medications Prior to Admission medications   Medication Sig Start Date End Date Taking? Authorizing Provider  acetaminophen (TYLENOL) 500 MG tablet Take 1,000 mg by mouth every 6 (six) hours as needed for mild pain (pain score 1-3), moderate pain (pain score 4-6) or headache.    [provider]  aspirin EC 81 MG tablet Take 1 tablet (81 mg total) by mouth daily. Swallow whole. 04/18/22   Lisbeth Renshaw, MD  atorvastatin (LIPITOR) 80 MG tablet Take 80 mg by mouth at bedtime.    [provider]  Blood Glucose Monitoring Suppl (CONTOUR NEXT MONITOR) w/Device KIT daily. 11/02/19   [provider]  Continuous Glucose Sensor (FREESTYLE LIBRE 3 SENSOR) MISC USE TO TEST BLOOD SUGAR AND CHANGE EVERY 14 DAYS AS DIRECTED    [provider]  furosemide (LASIX) 40 MG tablet Take 1 tablet (40 mg total) by mouth 2 (two) times daily. 02/08/23   Arty Baumgartner, NP  gabapentin (NEURONTIN) 300 MG capsule Take 1,200-1,500 mg by mouth See admin instructions. Take 1200 mg in the morning and 1500 mg at bedtime    [provider]  glipiZIDE (GLUCOTROL) 10 MG tablet Take 10 mg by mouth 2 (two) times daily.  02/04/23   [provider]  Insulin Pen Needle (BD PEN NEEDLE NANO U/F) 32G X 4 MM MISC USE ONCE AS DIRECTED 08/09/19   [provider]  isosorbide mononitrate (IMDUR) 120 MG 24 hr tablet Take 1 tablet (120 mg total) by mouth daily. 03/01/23   [name], MD  losartan (COZAAR) 25 MG tablet Take 0.5 tablets (12.5 mg total) by mouth daily. 09/28/22   [name], MD  metoprolol tartrate (LOPRESSOR) 100 MG tablet Take 1 tablet (100 mg total) by mouth 2 (two) times daily. 03/29/23   Sharlene Dory, PA-C  nitroGLYCERIN (NITROSTAT) 0.4 MG SL tablet Place 1 tablet (0.4 mg total) under the tongue every 5 (five) minutes as needed for chest pain. 01/23/23   [name], MD  oxyCODONE-acetaminophen (PERCOCET) 10-325 MG tablet Take 1 tablet by mouth every 4 (four) hours as needed for pain.    [provider]  pantoprazole (PROTONIX) 40 MG tablet Take 40 mg by mouth every morning.    [provider]  potassium chloride (KLOR-CON M) 10 MEQ tablet Take 1 tablet (10 mEq total) by mouth daily. 11/05/22   [name], MD  prasugrel (EFFIENT) 10 MG TABS tablet Take 1 tablet (10 mg total) by mouth every morning. 04/14/22   Lisbeth Renshaw, MD  promethazine (PHENERGAN) 25 MG tablet Take 25 mg by mouth 2 (two) times daily as needed for nausea or vomiting (Headache).  [provider]  ranolazine (RANEXA) 500 MG 12 hr tablet Take 1 tablet (500 mg total) by mouth 2 (two) times daily. 03/01/23   [name], MD  spironolactone (ALDACTONE) 25 MG tablet Take 25 mg by mouth daily.    [provider]  tiZANidine (ZANAFLEX) 4 MG tablet Take 4-8 mg by mouth See admin instructions. Take 4 mg in the morning, lunch ,and evening meal and 8 mg at bedtime 01/03/23   [provider]  TOUJEO SOLOSTAR 300 UNIT/ML Solostar Pen Inject 30 Units into the skin daily. 01/31/23   [provider]  traZODone (DESYREL) 50 MG tablet Take 50 mg by mouth at  bedtime. 08/26/20   [provider]      Allergies    Metoclopramide, Zetia [ezetimibe], and Penicillins    Review of Systems   Review of Systems  Cardiovascular:  Positive for chest pain.    Physical Exam Updated Vital Signs BP (!) 153/83   Pulse 72   Temp 98.9 F (37.2 C) (Oral)   Resp 18   Ht 5\' 7"  (1.702 m)   Wt 66.9 kg   SpO2 99%   BMI 23.10 kg/m  Physical Exam Vitals and nursing note reviewed.  Constitutional:      General: He is in acute distress.     Appearance: He is not ill-appearing or toxic-appearing.  HENT:     Head: Normocephalic and atraumatic.     Mouth/Throat:     Mouth: Mucous membranes are moist.     Pharynx: No oropharyngeal exudate or posterior oropharyngeal erythema.  Eyes:     General: No scleral icterus.       Right eye: No discharge.        Left eye: No discharge.     Conjunctiva/sclera: Conjunctivae normal.  Cardiovascular:     Rate and Rhythm: Normal rate and regular rhythm.     Pulses: Normal pulses.          Radial pulses are 2+ on the right side and 2+ on the left side.       Dorsalis pedis pulses are 2+ on the right side and 2+ on the left side.     Heart sounds: Normal heart sounds. No murmur heard. Pulmonary:     Effort: Pulmonary effort is normal. No respiratory distress.     Breath sounds: Normal breath sounds. No wheezing, rhonchi or rales.  Abdominal:     General: Bowel sounds are normal.     Palpations: Abdomen is soft.     Tenderness: There is no abdominal tenderness.  Musculoskeletal:     Right lower leg: No edema.     Left lower leg: No edema.  Skin:    General: Skin is warm and dry.     Findings: No rash.  Neurological:     General: No focal deficit present.     Mental Status: He is alert and oriented to person, place, and time. Mental status is at baseline.  Psychiatric:        Mood and Affect: Mood normal.     ED Results / Procedures / Treatments   Labs (all labs ordered are listed, but only  abnormal results are displayed) Labs Reviewed  CBC WITH DIFFERENTIAL/PLATELET - Abnormal; Notable for the following components:      Result Value   RBC 4.00 (*)    All other components within normal limits  COMPREHENSIVE METABOLIC PANEL - Abnormal; Notable for the following components:   Glucose, Bld  121 (*)    All other components within normal limits  CBG MONITORING, ED - Abnormal; Notable for the following components:   Glucose-Capillary 106 (*)    All other components within normal limits  BASIC METABOLIC PANEL  LIPID PANEL  TROPONIN I (HIGH SENSITIVITY)  TROPONIN I (HIGH SENSITIVITY)    EKG EKG Interpretation Date/Time:  Tuesday April 02 2023 22:03:29 EDT Ventricular Rate:  83 PR Interval:  125 QRS Duration:  80 QT Interval:  377 QTC Calculation: 438 R Axis:   43  Text Interpretation: Sinus rhythm Atrial premature complex Nonspecific repol abnormality, diffuse leads No significant change since prior 1/25 Confirmed by Meridee Score [phone]) on 04/02/2023 10:05:24 PM  Radiology DG Chest 2 View Result Date: 04/02/2023 CLINICAL DATA:  Chest pain EXAM: CHEST - 2 VIEW COMPARISON:  03/08/2023 FINDINGS: Right-sided shunt tubing tip terminating over the right atrial region. Post sternotomy changes. Left lower chest recording device. Low lung volumes without acute airspace disease, pleural effusion or pneumothorax. Moderate air distension of the stomach. IMPRESSION: Low lung volumes without acute airspace disease. Electronically Signed   By: Jasmine Pang M.D.   On: 04/02/2023 22:45    Procedures .Critical Care  Performed by: Dorthy Cooler, PA-C Authorized by: Dorthy Cooler, PA-C   Critical care provider statement:    Critical care time (minutes):  30   Critical care was necessary to treat or prevent imminent or life-threatening deterioration of the following conditions: intractable angina.   Critical care was time spent personally by me on the following  activities:  Development of treatment plan with patient or surrogate, discussions with consultants, evaluation of patient's response to treatment, examination of patient, ordering and review of laboratory studies, ordering and review of radiographic studies, ordering and performing treatments and interventions, pulse oximetry, re-evaluation of patient's condition and review of old charts   Care discussed with: admitting provider   Comments:     Complex cardiac hx and refractory angina requiring cardiology consultation and admission     Medications Ordered in ED Medications  nitroGLYCERIN 50 mg in dextrose 5 % 250 mL (0.2 mg/mL) infusion (5 mcg/min Intravenous New Bag/Given 04/03/23 0010)  aspirin EC tablet 81 mg (has no administration in time range)  atorvastatin (LIPITOR) tablet 80 mg (has no administration in time range)  furosemide (LASIX) tablet 40 mg (has no administration in time range)  losartan (COZAAR) tablet 12.5 mg (has no administration in time range)  metoprolol tartrate (LOPRESSOR) tablet 100 mg (has no administration in time range)  ranolazine (RANEXA) 12 hr tablet 500 mg (has no administration in time range)  spironolactone (ALDACTONE) tablet 25 mg (has no administration in time range)  glipiZIDE (GLUCOTROL) tablet 10 mg (has no administration in time range)  traZODone (DESYREL) tablet 50 mg (has no administration in time range)  insulin glargine-yfgn (SEMGLEE) injection 30 Units (has no administration in time range)  pantoprazole (PROTONIX) EC tablet 40 mg (has no administration in time range)  prasugrel (EFFIENT) tablet 10 mg (has no administration in time range)  aspirin chewable tablet 324 mg (has no administration in time range)    Or  aspirin suppository 300 mg (has no administration in time range)  nitroGLYCERIN (NITROSTAT) SL tablet 0.4 mg (has no administration in time range)  acetaminophen (TYLENOL) tablet 650 mg (has no administration in time range)  ondansetron  (ZOFRAN) injection 4 mg (has no administration in time range)  gabapentin (NEURONTIN) capsule 1,200 mg (has no administration in time range)  And  gabapentin (NEURONTIN) capsule 1,500 mg (has no administration in time range)  morphine (PF) 4 MG/ML injection 4 mg (4 mg Intravenous Given 04/02/23 2205)  HYDROmorphone (DILAUDID) injection 1 mg (1 mg Intravenous Given 04/03/23 0001)    ED Course/ Medical Decision Making/ A&P Clinical Course as of 04/03/23 0033  Tue Apr 02, 2023  6522 68 year old male with significant coronary disease here with severe chest pain that is been escalating over the last few weeks.  He rates it as 10 out of 10 and has not been responsive to his typical medications or nitroglycerin.  Getting labs EKG chest x-ray will need cardiology input. [MB]    Clinical Course User Index [MB] Terrilee Files, MD                                 Medical Decision Making  This patient presents to the ED for concern of chest pain, this involves an extensive number of treatment options, and is a complaint that carries with it a high risk of complications and morbidity.  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 Also on ASA and Effient.   Problem List / ED Course / Critical interventions / Medication management  Patient presented for intermittent chest pain x2 weeks relieved with NTG. Pain became severe and  constant around 8PM tonight and does not resolve with NTG. Pain persisting despite fentanyl and morphine. No other recent infectious symptoms. Patient appears to be in pain, but rest of physical exam reassuring. O2 100% on RA - patient on [place] for comfort. No tachycardia. BP reassuring.  I Ordered, and personally interpreted labs.  Initial trop within normal limits. CMP reassuring. CBC without leukocytosis or anemia. The patient was maintained on a cardiac monitor.  I personally viewed and interpreted the EKG/cardiac monitored which showed an underlying rhythm of: no acute changes. I ordered imaging studies including chest xray to assess for process contributing to patient's symptoms. I independently visualized and interpreted imaging which showed no acute cardiopulmonary disease. I agree with the radiologist interpretation. Starting patient on NTG infusion and dilaudid. Dr. Lynnette Caffey admitting provider for further cardiac workup. I have reviewed the patients home medicines and have made adjustments as needed  Social Determinants of Health:  Complex cardiac hx         Final Clinical Impression(s) / ED Diagnoses Final diagnoses:  Angina at rest [facility])    Rx / DC Orders ED Discharge Orders     None         Dorthy Cooler, [place] 04/03/23 0033    Terrilee Files, MD 04/03/23 1140

## 2023-04-02 NOTE — ED Provider Notes (Incomplete)
 [place] EMERGENCY DEPARTMENT AT [facility] Provider Note   CSN: [id number] Arrival date & time: 04/02/23  2148     History {Add pertinent medical, surgical, social history, OB history to HPI:1} Chief Complaint  Patient presents with  . Chest Pain    Terry Nelson is a 68 y.o. male with PMHx CAD, paroxysmal afib, HLD, CHF, CKD, DM, GERD, headaches, HTN, .  Patient has been having increased intermittent episodes of chest pain x2 weeks that sometimes also present while at rest. These episodes have been resolving with NTG. Patient stating that this episode of chest pain started around 8PM tonight and feels different as it is more associated with left shoulder radiating down left arm and has not resolved with multiple rounds to NTG.    Chest Pain      Home Medications Prior to Admission medications   Medication Sig Start Date End Date Taking? Authorizing Provider  acetaminophen (TYLENOL) 500 MG tablet Take 1,000 mg by mouth every 6 (six) hours as needed for mild pain (pain score 1-3), moderate pain (pain score 4-6) or headache.    [provider]  aspirin EC 81 MG tablet Take 1 tablet (81 mg total) by mouth daily. Swallow whole. 04/18/22   Lisbeth Renshaw, MD  atorvastatin (LIPITOR) 80 MG tablet Take 80 mg by mouth at bedtime.    [provider]  Blood Glucose Monitoring Suppl (CONTOUR NEXT MONITOR) w/Device KIT daily. 11/02/19   [provider]  Continuous Glucose Sensor (FREESTYLE LIBRE 3 SENSOR) MISC USE TO TEST BLOOD SUGAR AND CHANGE EVERY 14 DAYS AS DIRECTED    [provider]  furosemide (LASIX) 40 MG tablet Take 1 tablet (40 mg total) by mouth 2 (two) times daily. 02/08/23   Arty Baumgartner, NP  gabapentin (NEURONTIN) 300 MG capsule Take 1,200-1,500 mg by mouth See admin instructions. Take 1200 mg in the morning and 1500 mg at bedtime    [provider]  glipiZIDE (GLUCOTROL) 10 MG tablet Take 10 mg by mouth 2 (two)  times daily. 02/04/23   [provider]  Insulin Pen Needle (BD PEN NEEDLE NANO U/F) 32G X 4 MM MISC USE ONCE AS DIRECTED 08/09/19   [provider]  isosorbide mononitrate (IMDUR) 120 MG 24 hr tablet Take 1 tablet (120 mg total) by mouth daily. 03/01/23   [name], MD  losartan (COZAAR) 25 MG tablet Take 0.5 tablets (12.5 mg total) by mouth daily. 09/28/22   [name], MD  metoprolol tartrate (LOPRESSOR) 100 MG tablet Take 1 tablet (100 mg total) by mouth 2 (two) times daily. 03/29/23   Sharlene Dory, PA-C  nitroGLYCERIN (NITROSTAT) 0.4 MG SL tablet Place 1 tablet (0.4 mg total) under the tongue every 5 (five) minutes as needed for chest pain. 01/23/23   [name], MD  oxyCODONE-acetaminophen (PERCOCET) 10-325 MG tablet Take 1 tablet by mouth every 4 (four) hours as needed for pain.    [provider]  pantoprazole (PROTONIX) 40 MG tablet Take 40 mg by mouth every morning.    [provider]  potassium chloride (KLOR-CON M) 10 MEQ tablet Take 1 tablet (10 mEq total) by mouth daily. 11/05/22   [name], MD  prasugrel (EFFIENT) 10 MG TABS tablet Take 1 tablet (10 mg total) by mouth every morning. 04/14/22   Lisbeth Renshaw, MD  promethazine (PHENERGAN) 25 MG tablet Take 25 mg by mouth 2 (two) times daily as needed for nausea or  vomiting (Headache).    [provider]  ranolazine (RANEXA) 500 MG 12 hr tablet Take 1 tablet (500 mg total) by mouth 2 (two) times daily. 03/01/23   [name], MD  spironolactone (ALDACTONE) 25 MG tablet Take 25 mg by mouth daily.    [provider]  tiZANidine (ZANAFLEX) 4 MG tablet Take 4-8 mg by mouth See admin instructions. Take 4 mg in the morning, lunch ,and evening meal and 8 mg at bedtime 01/03/23   [provider]  TOUJEO SOLOSTAR 300 UNIT/ML Solostar Pen Inject 30 Units into the skin daily. 01/31/23   [provider]  traZODone (DESYREL) 50 MG tablet Take 50 mg by  mouth at bedtime. 08/26/20   [provider]      Allergies    Metoclopramide, Zetia [ezetimibe], and Penicillins    Review of Systems   Review of Systems  Cardiovascular:  Positive for chest pain.    Physical Exam Updated Vital Signs BP (!) 147/81   Pulse 79   Temp 98.9 F (37.2 C) (Oral)   Resp (!) 25   Ht 5\' 7"  (1.702 m)   Wt 66.9 kg   SpO2 100%   BMI 23.10 kg/m  Physical Exam Vitals and nursing note reviewed.  Constitutional:      General: He is in acute distress.     Appearance: He is not ill-appearing or toxic-appearing.  HENT:     Head: Normocephalic and atraumatic.     Mouth/Throat:     Mouth: Mucous membranes are moist.     Pharynx: No oropharyngeal exudate or posterior oropharyngeal erythema.  Eyes:     General: No scleral icterus.       Right eye: No discharge.        Left eye: No discharge.     Conjunctiva/sclera: Conjunctivae normal.  Cardiovascular:     Rate and Rhythm: Normal rate and regular rhythm.     Pulses: Normal pulses.          Radial pulses are 2+ on the right side and 2+ on the left side.       Dorsalis pedis pulses are 2+ on the right side and 2+ on the left side.     Heart sounds: Normal heart sounds. No murmur heard. Pulmonary:     Effort: Pulmonary effort is normal. No respiratory distress.     Breath sounds: Normal breath sounds. No wheezing, rhonchi or rales.  Abdominal:     General: Bowel sounds are normal.     Palpations: Abdomen is soft.     Tenderness: There is no abdominal tenderness.  Musculoskeletal:     Right lower leg: No edema.     Left lower leg: No edema.  Skin:    General: Skin is warm and dry.     Findings: No rash.  Neurological:     General: No focal deficit present.     Mental Status: He is alert and oriented to person, place, and time. Mental status is at baseline.  Psychiatric:        Mood and Affect: Mood normal.     ED Results / Procedures / Treatments   Labs (all labs ordered are listed,  but only abnormal results are displayed) Labs Reviewed  CBG MONITORING, ED - Abnormal; Notable for the following components:      Result Value   Glucose-Capillary 106 (*)    All other components within normal limits  CBC WITH DIFFERENTIAL/PLATELET  COMPREHENSIVE METABOLIC PANEL  TROPONIN I (HIGH SENSITIVITY)    EKG EKG Interpretation Date/Time:  Tuesday April 02 2023 22:03:29 EDT Ventricular Rate:  83 PR Interval:  125 QRS Duration:  80 QT Interval:  377 QTC Calculation: 438 R Axis:   43  Text Interpretation: Sinus rhythm Atrial premature complex Nonspecific repol abnormality, diffuse leads No significant change since prior 1/25 Confirmed by Meridee Score [phone]) on 04/02/2023 10:05:24 PM  Radiology No results found.  Procedures Procedures  {Document cardiac monitor, telemetry assessment procedure when appropriate:1}  Medications Ordered in ED Medications  morphine (PF) 4 MG/ML injection 4 mg (4 mg Intravenous Given 04/02/23 2205)    ED Course/ Medical Decision Making/ A&P Clinical Course as of 04/02/23 2324  Tue Apr 02, 2023  [age] year old male with significant coronary disease here with severe chest pain that is been escalating over the last few weeks.  He rates it as 10 out of 10 and has not been responsive to his typical medications or nitroglycerin.  Getting labs EKG chest x-ray will need cardiology input. [MB]    Clinical Course User Index [MB] Terrilee Files, MD   {   Click here for ABCD2, HEART and other calculatorsREFRESH Note before signing :1}                              Medical Decision Making  This patient presents to the ED for concern of chest pain, this involves an extensive number of treatment options, and is a complaint that carries with it a high risk of complications and morbidity.  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 Also on ASA and Effient.   Consultations Obtained:  I requested consultation with the ***,  and discussed lab and imaging findings as well as pertinent plan - they recommend: ***   Problem List / ED Course / Critical interventions / Medication management  Consulting Cardio given patient's complex cardiac hx along with intractable left sided chest pain radiating into left arm. PMHx __ Patient presented for intermittent chest pain x2 weeks relieved with NTG. Pain became severe and constant around 8PM tonight and does not resolve with NTG. Pain persisting despite fentanyl and morphine. No other recent infectious symptoms. Patient appears to be in pain, but rest of physical exam reassuring. O2 100% on RA - patient on [place] for comfort. No tachycardia. BP reassuring.  I Ordered, and personally interpreted labs.  Initial trop within normal limits. CMP reassuring. CBC without leukocytosis or anemia. The patient was maintained on a cardiac monitor.  I personally viewed and interpreted the EKG/cardiac monitored which showed an underlying rhythm of: no acute changes. I ordered imaging studies including chest xray to assess for process contributing to patient's symptoms. I independently visualized and interpreted imaging which showed no acute cardiopulmonary disease. I agree with the radiologist interpretation. Starting patient on NTG infusion and dilaudid. Considering CTA chest - any other imaging I should run by? I requested consultation with  the ***,  and discussed lab and imaging findings as well  as pertinent plan - they recommend: *** I have reviewed the patients home medicines and have made adjustments as needed  Social Determinants of Health:  Complex cardiac hx   {Document critical care time when appropriate:1} {Document review of labs and clinical decision tools ie heart score, Chads2Vasc2 etc:1}  {Document your independent review of radiology images, and any outside records:1} {Document your discussion with family members, caretakers, and with consultants:1} {Document social determinants of health affecting pt's care:1} {Document your decision making why or why not admission, treatments were needed:1} Final Clinical Impression(s) / ED Diagnoses Final diagnoses:  None    Rx / DC Orders ED Discharge Orders     None

## 2023-04-03 DIAGNOSIS — I200 Unstable angina: Secondary | ICD-10-CM | POA: Diagnosis not present

## 2023-04-03 LAB — BASIC METABOLIC PANEL
Anion gap: 11 (ref 5–15)
BUN: 19 mg/dL (ref 8–23)
CO2: 23 mmol/L (ref 22–32)
Calcium: 9.3 mg/dL (ref 8.9–10.3)
Chloride: 106 mmol/L (ref 98–111)
Creatinine, Ser: 0.99 mg/dL (ref 0.61–1.24)
GFR, Estimated: >60 mL/min (ref >60)
Glucose, Bld: 103 mg/dL — ABNORMAL HIGH (ref 70–99)
Potassium: 3.7 mmol/L (ref 3.5–5.1)
Sodium: 140 mmol/L (ref 135–145)

## 2023-04-03 LAB — TROPONIN I (HIGH SENSITIVITY): Troponin I (High Sensitivity): 14 ng/L (ref <18)

## 2023-04-03 LAB — CBG MONITORING, ED
Glucose-Capillary: 61 mg/dL — ABNORMAL LOW (ref 70–99)
Glucose-Capillary: 66 mg/dL — ABNORMAL LOW (ref 70–99)
Glucose-Capillary: 78 mg/dL (ref 70–99)
Glucose-Capillary: 170 mg/dL — ABNORMAL HIGH (ref 70–99)

## 2023-04-03 LAB — GLUCOSE, CAPILLARY
Glucose-Capillary: 138 mg/dL — ABNORMAL HIGH (ref 70–99)
Glucose-Capillary: 114 mg/dL — ABNORMAL HIGH (ref 70–99)
Glucose-Capillary: 66 mg/dL — ABNORMAL LOW (ref 70–99)

## 2023-04-03 LAB — HEMOGLOBIN A1C
Hgb A1c MFr Bld: 6.7 % — ABNORMAL HIGH (ref 4.8–5.6)
Mean Plasma Glucose: 145.59 mg/dL

## 2023-04-03 LAB — LIPID PANEL
Cholesterol: 120 mg/dL (ref 0–200)
HDL: 43 mg/dL (ref >40)
LDL Cholesterol: 67 mg/dL (ref 0–99)
Total CHOL/HDL Ratio: 2.8 ratio
Triglycerides: 52 mg/dL (ref <150)
VLDL: 10 mg/dL (ref 0–40)

## 2023-04-03 LAB — HEPARIN LEVEL (UNFRACTIONATED)
Heparin Unfractionated: <0.1 [IU]/mL — ABNORMAL LOW (ref 0.30–0.70)
Heparin Unfractionated: 0.65 [IU]/mL (ref 0.30–0.70)

## 2023-04-03 MED ORDER — ACETAMINOPHEN 325 MG PO TABS
650.0000 mg | ORAL_TABLET | ORAL | Status: DC | PRN
  Administered 2023-04-03: 650 mg via ORAL
  Filled 2023-04-03 (×2): qty 2

## 2023-04-03 MED ORDER — AMLODIPINE BESYLATE 10 MG PO TABS: 10.0000 mg | ORAL_TABLET | Freq: Every day | ORAL | 1 refills | Status: DC

## 2023-04-03 MED ORDER — LOSARTAN POTASSIUM 25 MG PO TABS
12.5000 mg | ORAL_TABLET | Freq: Every day | ORAL | Status: DC
  Administered 2023-04-03: 12.5 mg via ORAL
  Filled 2023-04-03: qty 0.5

## 2023-04-03 MED ORDER — GABAPENTIN 400 MG PO CAPS
1200.0000 mg | ORAL_CAPSULE | Freq: Every day | ORAL | Status: DC
  Administered 2023-04-03: 1200 mg via ORAL
  Filled 2023-04-03: qty 4

## 2023-04-03 MED ORDER — METOPROLOL TARTRATE 100 MG PO TABS
100.0000 mg | ORAL_TABLET | Freq: Two times a day (BID) | ORAL | Status: DC
  Administered 2023-04-03 (×2): 100 mg via ORAL
  Filled 2023-04-03 (×2): qty 4

## 2023-04-03 MED ORDER — INSULIN GLARGINE-YFGN 100 UNIT/ML ~~LOC~~ SOLN
30.0000 [IU] | Freq: Every day | SUBCUTANEOUS | Status: DC
  Administered 2023-04-03: 30 [IU] via SUBCUTANEOUS
  Filled 2023-04-03 (×2): qty 0.3

## 2023-04-03 MED ORDER — GABAPENTIN 300 MG PO CAPS
1200.0000 mg | ORAL_CAPSULE | ORAL | Status: DC
Start: 2023-04-03 — End: 2023-04-03

## 2023-04-03 MED ORDER — GLIPIZIDE 10 MG PO TABS
10.0000 mg | ORAL_TABLET | Freq: Two times a day (BID) | ORAL | Status: DC
  Administered 2023-04-03: 10 mg via ORAL
  Filled 2023-04-03 (×3): qty 1

## 2023-04-03 MED ORDER — PANTOPRAZOLE SODIUM 40 MG PO TBEC
40.0000 mg | DELAYED_RELEASE_TABLET | Freq: Every morning | ORAL | Status: DC
  Administered 2023-04-03: 40 mg via ORAL
  Filled 2023-04-03: qty 1

## 2023-04-03 MED ORDER — HEPARIN BOLUS VIA INFUSION
4000.0000 [IU] | Freq: Once | INTRAVENOUS | Status: AC
  Administered 2023-04-03: 4000 [IU] via INTRAVENOUS
  Filled 2023-04-03: qty 4000

## 2023-04-03 MED ORDER — NITROGLYCERIN 0.4 MG SL SUBL: 0.4000 mg | SUBLINGUAL_TABLET | SUBLINGUAL | Status: DC | PRN

## 2023-04-03 MED ORDER — RANOLAZINE ER 500 MG PO TB12
500.0000 mg | ORAL_TABLET | Freq: Two times a day (BID) | ORAL | Status: DC
  Administered 2023-04-03 (×2): 500 mg via ORAL
  Filled 2023-04-03 (×2): qty 1

## 2023-04-03 MED ORDER — HEPARIN (PORCINE) 25000 UT/250ML-% IV SOLN
1050.0000 [IU]/h | INTRAVENOUS | Status: DC
  Administered 2023-04-03: 800 [IU]/h via INTRAVENOUS
  Filled 2023-04-03: qty 250

## 2023-04-03 MED ORDER — HEPARIN BOLUS VIA INFUSION
2000.0000 [IU] | Freq: Once | INTRAVENOUS | Status: AC
  Administered 2023-04-03: 2000 [IU] via INTRAVENOUS
  Filled 2023-04-03: qty 2000

## 2023-04-03 MED ORDER — TRAZODONE HCL 50 MG PO TABS
50.0000 mg | ORAL_TABLET | Freq: Every day | ORAL | Status: DC
  Administered 2023-04-03: 50 mg via ORAL
  Filled 2023-04-03: qty 1

## 2023-04-03 MED ORDER — ATORVASTATIN CALCIUM 80 MG PO TABS
80.0000 mg | ORAL_TABLET | Freq: Every day | ORAL | Status: DC
  Administered 2023-04-03: 80 mg via ORAL
  Filled 2023-04-03: qty 2

## 2023-04-03 MED ORDER — GABAPENTIN 400 MG PO CAPS
1500.0000 mg | ORAL_CAPSULE | Freq: Every day | ORAL | Status: DC
  Administered 2023-04-03: 1500 mg via ORAL
  Filled 2023-04-03 (×2): qty 5

## 2023-04-03 MED ORDER — ASPIRIN 300 MG RE SUPP: 300.0000 mg | RECTAL | Status: DC

## 2023-04-03 MED ORDER — ONDANSETRON HCL 4 MG/2ML IJ SOLN: 4.0000 mg | Freq: Four times a day (QID) | INTRAMUSCULAR | Status: DC | PRN

## 2023-04-03 MED ORDER — PRASUGREL HCL 10 MG PO TABS
10.0000 mg | ORAL_TABLET | Freq: Every morning | ORAL | Status: DC
  Administered 2023-04-03: 10 mg via ORAL
  Filled 2023-04-03: qty 1

## 2023-04-03 MED ORDER — INSULIN ASPART 100 UNIT/ML IJ SOLN: 0.0000 [IU] | Freq: Three times a day (TID) | INTRAMUSCULAR | Status: DC

## 2023-04-03 MED ORDER — ASPIRIN 81 MG PO TBEC: 81.0000 mg | DELAYED_RELEASE_TABLET | Freq: Every day | ORAL | Status: DC

## 2023-04-03 MED ORDER — INFLUENZA VAC A&B SURF ANT ADJ 0.5 ML IM SUSY: 0.5000 mL | PREFILLED_SYRINGE | INTRAMUSCULAR | Status: DC

## 2023-04-03 MED ORDER — SPIRONOLACTONE 25 MG PO TABS
25.0000 mg | ORAL_TABLET | Freq: Every day | ORAL | Status: DC
  Administered 2023-04-03: 25 mg via ORAL
  Filled 2023-04-03: qty 1

## 2023-04-03 MED ORDER — FUROSEMIDE 40 MG PO TABS
40.0000 mg | ORAL_TABLET | Freq: Two times a day (BID) | ORAL | Status: DC
  Administered 2023-04-03: 40 mg via ORAL
  Filled 2023-04-03: qty 2

## 2023-04-03 MED ORDER — INSULIN ASPART 100 UNIT/ML IJ SOLN: 0.0000 [IU] | Freq: Every day | INTRAMUSCULAR | Status: DC

## 2023-04-03 MED ORDER — ASPIRIN 81 MG PO TBEC
81.0000 mg | DELAYED_RELEASE_TABLET | Freq: Every day | ORAL | Status: DC
  Administered 2023-04-03: 81 mg via ORAL
  Filled 2023-04-03: qty 1

## 2023-04-03 MED ORDER — HYDROMORPHONE HCL 1 MG/ML IJ SOLN
1.0000 mg | Freq: Once | INTRAMUSCULAR | Status: AC
  Administered 2023-04-03: 1 mg via INTRAVENOUS
  Filled 2023-04-03: qty 1

## 2023-04-03 MED ORDER — AMLODIPINE BESYLATE 10 MG PO TABS
10.0000 mg | ORAL_TABLET | Freq: Every day | ORAL | Status: DC
  Administered 2023-04-03: 10 mg via ORAL
  Filled 2023-04-03: qty 1

## 2023-04-03 MED ORDER — ASPIRIN 81 MG PO CHEW: 324.0000 mg | CHEWABLE_TABLET | ORAL | Status: DC

## 2023-04-03 NOTE — ED Notes (Signed)
 Pt called to have his blood sugar check, states his machine is reading 57. Cbg done by this RN and blood level 61. Apple juice, peanut butter and cracker given to the pt. Blood sugar to be reassess after pt finish eating the snack.

## 2023-04-03 NOTE — H&P (Signed)
 Cardiology Admission History and Physical   Patient ID: Terry Nelson MRN: [id number]; DOB: 04/16/1955   Admission date: 04/02/2023  PCP:  Jiles Harold, PA-C   [facility] Providers Cardiologist:  [name], MD        Chief Complaint:  chest pain  Patient Profile:   Terry Nelson is a 68 y.o. male with CAD who is being seen 04/03/2023 for the evaluation of unstable angina.  History of Present Illness:   Terry Nelson has a long history of CAD, s/p CABG with atretic LIMA, patent SVG to OM, multiple PCI, PAF, DM, HTN, HL, CKD.  Presents today with new onset chest pain, different from his typical substernal pain, this time involving L shoulder, radiating down L arm with a numb thumb.  Took 4 nitroglycerin at home with mild relief, but noted that instead of his BP going down, it went up to the 170s, so presented to the ED. Under considerable stress lately as wife has been hospitalized at [facility] with a complex medical condition possibly related to Non Hodgkins lymphoma.  At home, he is on multiple antianginals and has been adherent, including ranolazine, metoprolol, imdur.  Also on losartan and spironolactone. Has not tolerated higher doses of ranolazine in the past.  His last cath was earlier this year in Jan which noted the patent VG to OM, but diffuse native disease in the LAD and RCA that was not amenable to PCI/PTCA.  EDP was elevated, so lasix increased.  He acknowledges understanding that his distal LAD cannot be further intervened upon, but would like Dr. Lynnette Caffey to evaluate potential for re-cath given that his symptoms this time are different than what they were previously.  No major changes noted on ECG, Tn 13.  Pain has improved spontaneously in the ED, although still present, and RN just hung some nitroglycerin infusion (he has had bad headaches with this in the past) and given him some dilaudid.   BP was 170s when I was examining him with HR in the 70s.   Past Medical  History:  Diagnosis Date   Acute diastolic heart failure (HCC)    Angina pectoris (HCC) 04/09/2019   Anxiety state    pt denies   Body mass index (bmi) 26.0-26.9, adult    CAD (coronary artery disease)    a. 2002 s/p CABG x 3 (LIMA->LAD, VG->OM1->OM2); b. s/p prior LAD and dRCA/RPDA stenting; c. 04/2019 PTCA & lithotripsy of ISR throughout LAD; d. 08/2020 PCI/scoring balloon PTCA throughout LAD 2/2 ISR; e. 09/2020 Cath: LM nl, LAD 40ost/p, 40p/d, 75d ISR (FFR 0.86), 50d, LCX small, OM1 90, OM2 25, RCA 20d ISR, RPDA 20 ISR, VG->OM1->OM2 ok, LIMA->LAD 163m-->Med rx.   CAD (coronary artery disease) of artery bypass graft 04/09/2019   CAD (coronary artery disease), native coronary artery 06/02/2021   Chronic heart failure with preserved ejection fraction (HFpEF) (HCC)    a. 09/2020 Echo: Ef 60-65%, no rwma, nl RV size/fxn. Mod dil LA. triv MR. Mild AoV sclerosis w/o stenosis.   CKD (chronic kidney disease), stage II    Coronary artery disease involving native coronary artery of native heart with unstable angina pectoris (HCC) 04/27/2019   DDD (degenerative disc disease), lumbar    DM (diabetes mellitus) (HCC)    Gastritis    GERD (gastroesophageal reflux disease)    Headache    Hemorrhage    History of kidney stones    Hyperlipidemia    Hyperlipidemia LDL goal <70 04/09/2019  Hypertension    Hypertensive urgency 03/12/2012   Kidney stones    Lumbago    Myocardial infarction [facility])    Neck pain    PAC (premature atrial contraction) 08/11/2020   PAF (paroxysmal atrial fibrillation) (HCC) 08/11/2020   Paroxysmal A-fib (HCC)    a. CHA2DS2VASc = 4-->Eliquis; b. 06/2020 Zio: No afib. Up to 20 beats PSVT.   Pernicious anemia    Unstable angina (HCC)     Past Surgical History:  Procedure Laterality Date   ABLATION  2017   CARDIAC CATHETERIZATION     CORONARY ARTERY BYPASS GRAFT     CORONARY BALLOON ANGIOPLASTY N/A 04/27/2019   Procedure: CORONARY BALLOON ANGIOPLASTY;  Surgeon: Yvonne Kendall, MD;  Location: [facility];  Service: Cardiovascular;  Laterality: N/A;   CORONARY BALLOON ANGIOPLASTY N/A 12/16/2019   Procedure: CORONARY BALLOON ANGIOPLASTY;  Surgeon: Lyn Records, MD;  Location: [facility];  Service: Cardiovascular;  Laterality: N/A;   CORONARY BALLOON ANGIOPLASTY N/A 09/06/2020   Procedure: CORONARY BALLOON ANGIOPLASTY;  Surgeon: Lyn Records, MD;  Location: [facility];  Service: Cardiovascular;  Laterality: N/A;   CORONARY BALLOON ANGIOPLASTY N/A 06/02/2021   Procedure: CORONARY BALLOON ANGIOPLASTY;  Surgeon: Lyn Records, MD;  Location: [facility];  Service: Cardiovascular;  Laterality: N/A;   CORONARY BALLOON ANGIOPLASTY N/A 07/31/2022   Procedure: CORONARY BALLOON ANGIOPLASTY;  Surgeon: Kathleene Hazel, MD;  Location: [facility];  Service: Cardiovascular;  Laterality: N/A;   CORONARY PRESSURE/FFR STUDY N/A 09/30/2020   Procedure: INTRAVASCULAR PRESSURE WIRE/FFR STUDY;  Surgeon: [name], MD;  Location: [facility];  Service: Cardiovascular;  Laterality: N/A;   CORONARY PRESSURE/FFR STUDY N/A 06/02/2021   Procedure: INTRAVASCULAR PRESSURE WIRE/FFR STUDY;  Surgeon: Lyn Records, MD;  Location: [facility];  Service: Cardiovascular;  Laterality: N/A;   CORONARY ULTRASOUND/IVUS N/A 04/27/2019   Procedure: Intravascular Ultrasound/IVUS;  Surgeon: Yvonne Kendall, MD;  [facility]: [facility];  Service: Cardiovascular;  Laterality: N/A;   CORONARY ULTRASOUND/IVUS N/A 12/16/2019   Procedure: Intravascular Ultrasound/IVUS;  Surgeon: Lyn Records, MD;  Location[facility];  Service: Cardiovascular;  Laterality: N/A;   KNEE ARTHROSCOPY Bilateral    3 times on each knee   LEFT HEART CATH AND CORS/GRAFTS ANGIOGRAPHY N/A 04/09/2019   Procedure: LEFT HEART CATH AND CORS/GRAFTS ANGIOGRAPHY;  Surgeon: Lyn Records, MD;  [facility]: [facility];  Service: Cardiovascular;   Laterality: N/A;   LEFT HEART CATH AND CORS/GRAFTS ANGIOGRAPHY N/A 12/16/2019   Procedure: LEFT HEART CATH AND CORS/GRAFTS ANGIOGRAPHY;  Surgeon: Lyn Records, MD;  [facility]: [facility];  Service: Cardiovascular;  Laterality: N/A;   LEFT HEART CATH AND CORS/GRAFTS ANGIOGRAPHY N/A 09/06/2020   Procedure: LEFT HEART CATH AND CORS/GRAFTS ANGIOGRAPHY;  Surgeon: Lyn Records, MD;  [facility]: [facility];  Service: Cardiovascular;  Laterality: N/A;   LEFT HEART CATH AND CORS/GRAFTS ANGIOGRAPHY N/A 09/30/2020   Procedure: LEFT HEART CATH AND CORS/GRAFTS ANGIOGRAPHY;  Surgeon: [name], MD;  [facility]: [facility];  Service: Cardiovascular;  Laterality: N/A;   LEFT HEART CATH AND CORS/GRAFTS ANGIOGRAPHY N/A 06/02/2021   Procedure: LEFT HEART CATH AND CORS/GRAFTS ANGIOGRAPHY;  Surgeon: Lyn Records, MD;  [facility];  Service: Cardiovascular;  Laterality: N/A;   LEFT HEART CATH AND CORS/GRAFTS ANGIOGRAPHY N/A 07/31/2022   Procedure: LEFT HEART CATH AND CORS/GRAFTS ANGIOGRAPHY;  Surgeon:  Kathleene Hazel, MD;  [facility];  Service: Cardiovascular;  Laterality: N/A;   LEFT HEART CATH AND CORS/GRAFTS ANGIOGRAPHY N/A 01/07/2023   Procedure: LEFT HEART CATH AND CORS/GRAFTS ANGIOGRAPHY;  Surgeon: Kathleene Hazel, MD;  [facility];  Service: Cardiovascular;  Laterality: N/A;   LEFT HEART CATH AND CORS/GRAFTS ANGIOGRAPHY N/A 02/08/2023   Procedure: LEFT HEART CATH AND CORS/GRAFTS ANGIOGRAPHY;  Surgeon: [name], MD;  [facility]: [facility];  Service: Cardiovascular;  Laterality: N/A;   LUMBAR FUSION     L5-S1   LUMBAR LAMINECTOMY/DECOMPRESSION MICRODISCECTOMY N/A 04/13/2022   Procedure: LUMBAR THREE-FOUR LAMINECTOMY AND FORAMINOTOMY;  Surgeon: Dawley, Alan Mulder, DO;  Location: [facility];  Service: Neurosurgery;  Laterality: N/A;  3C   LUMBAR PERITONEAL SHUNT     pt states "this is blocked"   SHOULDER  ARTHROSCOPY Bilateral      Medications Prior to Admission: Prior to Admission medications   Medication Sig Start Date End Date Taking? Authorizing Provider  acetaminophen (TYLENOL) 500 MG tablet Take 1,000 mg by mouth every 6 (six) hours as needed for mild pain (pain score 1-3), moderate pain (pain score 4-6) or headache.    [provider]  aspirin EC 81 MG tablet Take 1 tablet (81 mg total) by mouth daily. Swallow whole. 04/18/22   Lisbeth Renshaw, MD  atorvastatin (LIPITOR) 80 MG tablet Take 80 mg by mouth at bedtime.    [provider]  Blood Glucose Monitoring Suppl (CONTOUR NEXT MONITOR) w/Device KIT daily. 11/02/19   [provider]  Continuous Glucose Sensor (FREESTYLE LIBRE 3 SENSOR) MISC USE TO TEST BLOOD SUGAR AND CHANGE EVERY 14 DAYS AS DIRECTED    [provider]  furosemide (LASIX) 40 MG tablet Take 1 tablet (40 mg total) by mouth 2 (two) times daily. 02/08/23   Arty Baumgartner, NP  gabapentin (NEURONTIN) 300 MG capsule Take 1,200-1,500 mg by mouth See admin instructions. Take 1200 mg in the morning and 1500 mg at bedtime    [provider]  glipiZIDE (GLUCOTROL) 10 MG tablet Take 10 mg by mouth 2 (two) times daily. 02/04/23   [provider]  Insulin Pen Needle (BD PEN NEEDLE NANO U/F) 32G X 4 MM MISC USE ONCE AS DIRECTED 08/09/19   [provider]  isosorbide mononitrate (IMDUR) 120 MG 24 hr tablet Take 1 tablet (120 mg total) by mouth daily. 03/01/23   [name], MD  losartan (COZAAR) 25 MG tablet Take 0.5 tablets (12.5 mg total) by mouth daily. 09/28/22   [name], MD  metoprolol tartrate (LOPRESSOR) 100 MG tablet Take 1 tablet (100 mg total) by mouth 2 (two) times daily. 03/29/23   Sharlene Dory, PA-C  nitroGLYCERIN (NITROSTAT) 0.4 MG SL tablet Place 1 tablet (0.4 mg total) under the tongue every 5 (five) minutes as needed for chest pain. 01/23/23   [name], MD  oxyCODONE-acetaminophen  (PERCOCET) 10-325 MG tablet Take 1 tablet by mouth every 4 (four) hours as needed for pain.    [provider]  pantoprazole (PROTONIX) 40 MG tablet Take 40 mg by mouth every morning.    [provider]  potassium chloride (KLOR-CON M) 10 MEQ tablet Take 1 tablet (10 mEq total) by mouth daily. 11/05/22   [name], MD  prasugrel (EFFIENT) 10 MG TABS tablet Take 1 tablet (10 mg total) by mouth every morning. 04/14/22   Lisbeth Renshaw, MD  promethazine (PHENERGAN) 25  MG tablet Take 25 mg by mouth 2 (two) times daily as needed for nausea or vomiting (Headache).    [provider]  ranolazine (RANEXA) 500 MG 12 hr tablet Take 1 tablet (500 mg total) by mouth 2 (two) times daily. 03/01/23   [name], MD  spironolactone (ALDACTONE) 25 MG tablet Take 25 mg by mouth daily.    [provider]  tiZANidine (ZANAFLEX) 4 MG tablet Take 4-8 mg by mouth See admin instructions. Take 4 mg in the morning, lunch ,and evening meal and 8 mg at bedtime 01/03/23   [provider]  TOUJEO SOLOSTAR 300 UNIT/ML Solostar Pen Inject 30 Units into the skin daily. 01/31/23   [provider]  traZODone (DESYREL) 50 MG tablet Take 50 mg by mouth at bedtime. 08/26/20   [provider]     Allergies:    Allergies  Allergen Reactions   Metoclopramide Other (See Comments)    DYSKINESIAS contractions of whole body   Zetia [Ezetimibe] Diarrhea   Penicillins Swelling and Rash         Social History:   Social History   Socioeconomic History   Marital status: Married    Spouse name: Not on file   Number of children: 2   Years of education: Not on file   Highest education level: Not on file  Occupational History   Not on file  Tobacco Use   Smoking status: Never   Smokeless tobacco: Former    Types: Chew   Tobacco comments:    per pt chewed tobacco in high school   Vaping Use   Vaping status: Never Used  Substance and Sexual Activity    Alcohol use: Never   Drug use: Never   Sexual activity: Not on file    Comment: MARRIED  Other Topics Concern   Not on file  Social History Narrative   Not on file   Social Drivers of Health   Financial Resource Strain: Not on file  Food Insecurity: No Food Insecurity (01/08/2023)   Hunger Vital Sign    Worried About Running Out of Food in the Last Year: Never true    Ran Out of Food in the Last Year: Never true  Transportation Needs: No Transportation Needs (01/08/2023)   PRAPARE - Administrator, Civil Service (Medical): No    Lack of Transportation (Non-Medical): No  Physical Activity: Not on file  Stress: Not on file  Social Connections: Moderately Isolated (01/08/2023)   Social Connection and Isolation Panel [NHANES]    Frequency of Communication with Friends and Family: Never    Frequency of Social Gatherings with Friends and Family: Never    Attends Religious Services: Never    Database administrator or Organizations: Yes    Attends Banker Meetings: 1 to 4 times per year    Marital Status: Married  Catering manager Violence: Not At Risk (01/08/2023)   Humiliation, Afraid, Rape, and Kick questionnaire    Fear of Current or Ex-Partner: No    Emotionally Abused: No    Physically Abused: No    Sexually Abused: No    Family History:   The patient's family history includes Diabetes in his father; Heart disease in an other family member; High blood pressure in his mother; Stroke in his mother.    ROS:  Please see the history of present illness.  All other ROS reviewed and negative.     Physical Exam/Data:   Vitals:  04/02/23 2152 04/02/23 2154 04/02/23 2209 04/02/23 2330  BP:   (!) 147/81 (!) 153/83  Pulse:   79 72  Resp:   (!) 25 18  Temp:   98.9 F (37.2 C)   TempSrc:   Oral   SpO2: 99%  100% 99%  Weight:  66.9 kg    Height:  5\' 7"  (1.702 m)     No intake or output data in the 24 hours ending 04/03/23 0038    04/02/2023    9:54  PM 03/29/2023   10:18 AM 03/01/2023    2:12 PM  Last 3 Weights  Weight (lbs) 147 lb 7.8 oz 147 lb 9.6 oz 138 lb 9.6 oz  Weight (kg) 66.9 kg 66.951 kg 62.869 kg     Body mass index is 23.1 kg/m.  General:  Well nourished, well developed, in no acute distress HEENT: normal Neck: no JVD Vascular: No carotid bruits; Distal pulses 2+ bilaterally   Cardiac:  normal S1, S2; RRR; no murmur  Lungs:  clear to auscultation bilaterally, no wheezing, rhonchi or rales  Abd: soft, nontender, no hepatomegaly  Ext: no edema Musculoskeletal:  No deformities, BUE and BLE strength normal and equal Skin: warm and dry  Neuro:  CNs 2-12 intact, no focal abnormalities noted Psych:  Normal affect    Laboratory Data:  High Sensitivity Troponin:   Recent Labs  Lab 04/02/23 2217  TROPONINIHS 13      Chemistry Recent Labs  Lab 04/02/23 2217  NA 140  K 3.7  CL 104  CO2 24  GLUCOSE 121*  BUN 19  CREATININE 1.16  CALCIUM 9.6  GFRNONAA >60  ANIONGAP 12    Recent Labs  Lab 04/02/23 2217  PROT 7.1  ALBUMIN 4.4  AST 22  ALT 17  ALKPHOS 78  BILITOT 0.8   Hematology Recent Labs  Lab 04/02/23 2217  WBC 9.4  RBC 4.00*  HGB 13.4  HCT 40.0  MCV 100.0  MCH 33.5  MCHC 33.5  RDW 13.1  PLT 179   Thyroid No results for input(s): "TSH", "FREET4" in the last 168 hours. BNPNo results for input(s): "BNP", "PROBNP" in the last 168 hours.  DDimer No results for input(s): "DDIMER" in the last 168 hours.   Radiology/Studies:  DG Chest 2 View Result Date: 04/02/2023 CLINICAL DATA:  Chest pain EXAM: CHEST - 2 VIEW COMPARISON:  03/08/2023 FINDINGS: Right-sided shunt tubing tip terminating over the right atrial region. Post sternotomy changes. Left lower chest recording device. Low lung volumes without acute airspace disease, pleural effusion or pneumothorax. Moderate air distension of the stomach. IMPRESSION: Low lung volumes without acute airspace disease. Electronically Signed   By: Jasmine Pang  M.D.   On: 04/02/2023 22:45     Assessment and Plan:  Unstable angina with pain refractory to SL nitroglycerin at home in the setting of increased home stressors.  For tonight, will park him on heparin and nitroglycerin infusions to see if chest pain can be quiesced.  May be worth touching base with Dr. Lynnette Caffey in the morning to assess appetite for re-cath given his multiple caths in the past, with the last recent showing no intervenable macrovascular disease.  He is already pretty well optimized from a CAD antianginal perspective to maximally tolerated doses. His LDL and A1c are both well controlled as well.  Tonight his BP is a little high.  One might consider in the AM whether carvedilol might give him a little more BP coverage, I also see  that he was on amlodipine in the past but now no longer on it.    Risk Assessment/Risk Scores:    TIMI Risk Score for Unstable Angina or Non-ST Elevation MI:   The patient's TIMI risk score is 5, which indicates a 26% risk of all cause mortality, new or recurrent myocardial infarction or need for urgent revascularization in the next 14 days.      Code Status: Full Code  Severity of Illness: The appropriate patient status for this patient is OBSERVATION. Observation status is judged to be reasonable and necessary in order to provide the required intensity of service to ensure the patient's safety. The patient's presenting symptoms, physical exam findings, and initial radiographic and laboratory data in the context of their medical condition is felt to place them at decreased risk for further clinical deterioration. Furthermore, it is anticipated that the patient will be medically stable for discharge from the hospital within 2 midnights of admission.    For questions or updates, please contact [facility] Please consult [email or web address] for contact info under     Signed, Eyvonne Left, MD  04/03/2023 12:38 AM

## 2023-04-03 NOTE — Progress Notes (Signed)
 PHARMACY - ANTICOAGULATION  Pharmacy Consult for Heparin Indication: chest pain/ACS Brief A/P: Heparin level subtherapeutic Increase Heparin rate  Allergies  Allergen Reactions   Metoclopramide Other (See Comments)    DYSKINESIAS contractions of whole body   Zetia [Ezetimibe] Diarrhea   Penicillins Swelling and Rash         Patient Measurements: Height: 5\' 7"  (170.2 cm) Weight: 66.9 kg (147 lb 7.8 oz) IBW/kg (Calculated) : 66.1  Vital Signs: Temp: 98.6 F (37 C) (03/26 0306) Temp Source: Oral (03/26 0306) BP: 175/92 (03/26 0500) Pulse Rate: 68 (03/26 0500)  Labs: Recent Labs    04/02/23 2217 04/03/23 0011 04/03/23 0430  HGB 13.4  --   --   HCT 40.0  --   --   PLT 179  --   --   HEPARINUNFRC  --   --  <0.10*  CREATININE 1.16  --  0.99  TROPONINIHS 13 14  --     Estimated Creatinine Clearance: 68.6 mL/min (by C-G formula based on SCr of 0.99 mg/dL).  Assessment: 68 y.o. male with chest pain for heparin  Goal of Therapy:  Heparin level 0.3-0.7 units/ml Monitor platelets by anticoagulation protocol: Yes   Plan:  Heparin 2000 units IV bolus, then increase heparin 1050 units/hr Check heparin level in 6 hours.   Jarek Longton, Gary Fleet 04/03/2023,5:53 AM

## 2023-04-03 NOTE — ED Notes (Signed)
 Sleeping, Arousable to voice. Alert, NAD, calm, interactive. Breakfast not eaten. BS checked. Meds given. VSS.

## 2023-04-03 NOTE — Inpatient Diabetes Management (Signed)
 Inpatient Diabetes Program Recommendations  AACE/ADA: New Consensus Statement on Inpatient Glycemic Control   Target Ranges:  Prepandial:   less than 140 mg/dL      Peak postprandial:   less than 180 mg/dL (1-2 hours)      Critically ill patients:  140 - 180 mg/dL    Latest Reference Range & Units 04/03/23 04:49 04/03/23 05:22 04/03/23 05:59 04/03/23 08:58 04/03/23 10:57  Glucose-Capillary 70 - 99 mg/dL 61 (L) 66 (L) 78 161 (H) 138 (H)   Review of Glycemic Control  Diabetes history: DM2 Outpatient Diabetes medications: Toujeo 30 units daily, Glipizide 10 mg BID Current orders for Inpatient glycemic control: Semglee 30  units daily, Novolog 0-9 units TID with meals, Novolog 0-5 units at bedtime, Glipizide 10 mg BID  Inpatient Diabetes Program Recommendations:    DM medications: CBG down to 61 mg/dl at 0:96 am today. While inpatient, please discontinue Glipizide.  Thanks, Orlando Penner, RN, MSN, CDCES Diabetes Coordinator Inpatient Diabetes Program [phone] (Team Pager from 8am to 5pm)

## 2023-04-03 NOTE — Progress Notes (Signed)
 Patient admitted this morning by the cardiology fellow.  His notes are reviewed.  The patient's troponins are normal.  He has extensive CAD and I have reviewed his last 2 heart catheterization films.  There are no targets for PCI.  He has had 2 heart catheterizations in the last 3 months.  His blood pressure was elevated at the time of his increase in angina.  In reviewing his medications, I think there is an opportunity to escalate his antianginal therapy with amlodipine.  Will discontinue IV heparin and IV nitroglycerin and start him on amlodipine 10 mg daily.  Will continue his other medical therapy.  I will check back with him later this afternoon to see how he is doing and consider discharge at that time.  Plan discussed with patient who is in agreement.  Terry Nelson 04/03/2023 11:33 AM

## 2023-04-03 NOTE — Progress Notes (Signed)
 PHARMACY - ANTICOAGULATION CONSULT NOTE  Pharmacy Consult for Heparin Indication: chest pain/ACS  Allergies  Allergen Reactions   Metoclopramide Other (See Comments)    DYSKINESIAS contractions of whole body   Zetia [Ezetimibe] Diarrhea   Penicillins Swelling and Rash         Patient Measurements: Height: 5\' 7"  (170.2 cm) Weight: 66.9 kg (147 lb 7.8 oz) IBW/kg (Calculated) : 66.1  Vital Signs: Temp: 98.9 F (37.2 C) (03/25 2209) Temp Source: Oral (03/25 2209) BP: 153/83 (03/25 2330) Pulse Rate: 72 (03/25 2330)  Labs: Recent Labs    04/02/23 2217  HGB 13.4  HCT 40.0  PLT 179  CREATININE 1.16  TROPONINIHS 13    Estimated Creatinine Clearance: 58.6 mL/min (by C-G formula based on SCr of 1.16 mg/dL).   Medical History: Past Medical History:  Diagnosis Date   Acute diastolic heart failure (HCC)    Angina pectoris (HCC) 04/09/2019   Anxiety state    pt denies   Body mass index (bmi) 26.0-26.9, adult    CAD (coronary artery disease)    a. 2002 s/p CABG x 3 (LIMA->LAD, VG->OM1->OM2); b. s/p prior LAD and dRCA/RPDA stenting; c. 04/2019 PTCA & lithotripsy of ISR throughout LAD; d. 08/2020 PCI/scoring balloon PTCA throughout LAD 2/2 ISR; e. 09/2020 Cath: LM nl, LAD 40ost/p, 40p/d, 75d ISR (FFR 0.86), 50d, LCX small, OM1 90, OM2 25, RCA 20d ISR, RPDA 20 ISR, VG->OM1->OM2 ok, LIMA->LAD 128m-->Med rx.   CAD (coronary artery disease) of artery bypass graft 04/09/2019   CAD (coronary artery disease), native coronary artery 06/02/2021   Chronic heart failure with preserved ejection fraction (HFpEF) (HCC)    a. 09/2020 Echo: Ef 60-65%, no rwma, nl RV size/fxn. Mod dil LA. triv MR. Mild AoV sclerosis w/o stenosis.   CKD (chronic kidney disease), stage II    Coronary artery disease involving native coronary artery of native heart with unstable angina pectoris (HCC) 04/27/2019   DDD (degenerative disc disease), lumbar    DM (diabetes mellitus) (HCC)    Gastritis    GERD  (gastroesophageal reflux disease)    Headache    Hemorrhage    History of kidney stones    Hyperlipidemia    Hyperlipidemia LDL goal <70 04/09/2019   Hypertension    Hypertensive urgency 03/12/2012   Kidney stones    Lumbago    Myocardial infarction [facility])    Neck pain    PAC (premature atrial contraction) 08/11/2020   PAF (paroxysmal atrial fibrillation) (HCC) 08/11/2020   Paroxysmal A-fib (HCC)    a. CHA2DS2VASc = 4-->Eliquis; b. 06/2020 Zio: No afib. Up to 20 beats PSVT.   Pernicious anemia    Unstable angina (HCC)     Medications:  No current facility-administered medications on file prior to encounter.   Current Outpatient Medications on File Prior to Encounter  Medication Sig Dispense Refill   acetaminophen (TYLENOL) 500 MG tablet Take 1,000 mg by mouth every 6 (six) hours as needed for mild pain (pain score 1-3), moderate pain (pain score 4-6) or headache.     aspirin EC 81 MG tablet Take 1 tablet (81 mg total) by mouth daily. Swallow whole. 30 tablet 11   atorvastatin (LIPITOR) 80 MG tablet Take 80 mg by mouth at bedtime.     Blood Glucose Monitoring Suppl (CONTOUR NEXT MONITOR) w/Device KIT daily.     Continuous Glucose Sensor (FREESTYLE LIBRE 3 SENSOR) MISC USE TO TEST BLOOD SUGAR AND CHANGE EVERY 14 DAYS AS DIRECTED     furosemide (  LASIX) 40 MG tablet Take 1 tablet (40 mg total) by mouth 2 (two) times daily. 60 tablet 1   gabapentin (NEURONTIN) 300 MG capsule Take 1,200-1,500 mg by mouth See admin instructions. Take 1200 mg in the morning and 1500 mg at bedtime     glipiZIDE (GLUCOTROL) 10 MG tablet Take 10 mg by mouth 2 (two) times daily.     Insulin Pen Needle (BD PEN NEEDLE NANO U/F) 32G X 4 MM MISC USE ONCE AS DIRECTED     isosorbide mononitrate (IMDUR) 120 MG 24 hr tablet Take 1 tablet (120 mg total) by mouth daily. 90 tablet 3   losartan (COZAAR) 25 MG tablet Take 0.5 tablets (12.5 mg total) by mouth daily. 45 tablet 3   metoprolol tartrate (LOPRESSOR) 100 MG tablet  Take 1 tablet (100 mg total) by mouth 2 (two) times daily. 90 tablet 3   nitroGLYCERIN (NITROSTAT) 0.4 MG SL tablet Place 1 tablet (0.4 mg total) under the tongue every 5 (five) minutes as needed for chest pain. 75 tablet 3   oxyCODONE-acetaminophen (PERCOCET) 10-325 MG tablet Take 1 tablet by mouth every 4 (four) hours as needed for pain.     pantoprazole (PROTONIX) 40 MG tablet Take 40 mg by mouth every morning.     potassium chloride (KLOR-CON M) 10 MEQ tablet Take 1 tablet (10 mEq total) by mouth daily. 90 tablet 3   prasugrel (EFFIENT) 10 MG TABS tablet Take 1 tablet (10 mg total) by mouth every morning. 30 tablet 0   promethazine (PHENERGAN) 25 MG tablet Take 25 mg by mouth 2 (two) times daily as needed for nausea or vomiting (Headache).     ranolazine (RANEXA) 500 MG 12 hr tablet Take 1 tablet (500 mg total) by mouth 2 (two) times daily. 180 tablet 3   spironolactone (ALDACTONE) 25 MG tablet Take 25 mg by mouth daily.     tiZANidine (ZANAFLEX) 4 MG tablet Take 4-8 mg by mouth See admin instructions. Take 4 mg in the morning, lunch ,and evening meal and 8 mg at bedtime     TOUJEO SOLOSTAR 300 UNIT/ML Solostar Pen Inject 30 Units into the skin daily.     traZODone (DESYREL) 50 MG tablet Take 50 mg by mouth at bedtime.       Assessment: 68 y.o. male with chest pain for heparin  Goal of Therapy:  Heparin level 0.3-0.7 units/ml Monitor platelets by anticoagulation protocol: Yes   Plan:  Heparin 4000 units IV bolus, then start heparin 800 units/hr Check heparin level in 8 hours.   Eddie Candle 04/03/2023,12:33 AM

## 2023-04-03 NOTE — Discharge Summary (Signed)
 Discharge Summary    Patient ID: Terry Nelson MRN: [id number]; DOB: 14-Jul-1955  Admit date: 04/02/2023 Discharge date: 04/03/2023  PCP:  Jiles Harold, PA-C   [facility] Providers Cardiologist:  [name], MD     Discharge Diagnoses    Principal Problem:   Unstable angina [facility]) Active Problems:   Coronary artery disease involving native coronary artery of native heart with unstable angina pectoris (HCC)   Hypertension   Hyperlipidemia LDL goal <50  Diagnostic Studies/Procedures    N/a  _____________   History of Present Illness     Terry Nelson is a 68 y.o. male with  a hx of CAD s/p CABG in 2002 by Dr. Dorris Fetch, overlapping proximal to distal LAD stents with ISR, multiple subsequent balloon angioplasty procedures,, paroxysmal atrial fibrillation s/p ablation, DMII, hypertension, hyperlipemia, stage II chronic kidney disease who presented ED with complaints of chest pain.    Office appointment November 2022 he felt like the Ranexa was helping. His Ranexa was increased to 1,000mg  twice a day. This dose resulted in upper and lower extremity weakness that resolved after reducing the dose to 500 mg twice daily.  At that time he was having progressive DOE requiring 10-12 nitroglycerin a day.  He also had COVID about 3 to 4 weeks prior with severe sinus congestion although this had resolved.   He then contacted our office on 01/2021 with chest pain related to not having Ranexa and this was refilled. He did have recurrent angina when walking from admitting upstairs.  Sent for admission.  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  There were no plans for repeat cardiac catheterization unless he had elevated enzymes or EKG changes given the nature of his disease.  He was noted to be volume overloaded on exam and treated with IV Lasix.  He was started on Farxiga right.  We continued his Lasix 40 mg daily and emphasized the need for daily weights at home.    Seen again in February 2023, he presented to the office and was still having chest pain when he is up moving around and occasionally at rest.  He reported that they stopped his Eliquis when he was in the hospital.  He did have some associated shortness of breath.  He had been started on Farxiga while in the hospital recently and a Bmp was performed which showed his creatinine bumped to 1.48.  He continued to take nitroglycerin tabs daily.  Was discussed increasing his Imdur to help with his chest pain.    He was seen in January 2025 of this year with unstable angina.  Recommended admission to the hospital for coronary angiography.  He was unable to be admitted to the hospital that day because he had a wife that requires his care.  He remained on extended DAPT.  Ultimately did undergo cardiac catheterization January 31 which found patent vein graft to the obtuse marginal LIMA to LAD was not imaged.  Diffuse right coronary artery disease with patent distal RCA stent.  Distal LAD occlusion unchanged from previous.  Recommendations were for high-dose Ranolazine and increase Lasix for elevated LVEDP.   He presented to the ED on 3/26 with complaints of chest pain that was refractory to sublingual nitroglycerin.  Labs in the ED showed sodium 140, potassium 3.7, creatinine 1.16, high-sensitivity troponin 13>> 14, WBC 9.4, hemoglobin 13.4.  Chest x-ray with no acute finding.  He was started on IV heparin and IV nitroglycerin and  admitted overnight for further management.  Hospital Course     CAD -- As noted above he has extensive coronary disease with recent cardiac catheterization noting no targets for PCI.  His cardiac catheterization films were reviewed by Dr. Excell Seltzer with recommendations to continue medical management with further escalation. -- Continue aspirin, Effient, atorvastatin 80 mg daily Ranexa 500 mg twice daily, spironolactone 25 mg daily, metoprolol 100 mg twice daily, Imdur 120 mg daily.   Amlodipine 10 mg daily added  Paroxysmal atrial fibrillation -- Remained in sinus rhythm during admission, continue metoprolol 100 mg twice daily -- Review of notes indicates that his Eliquis was previously stopped during a prior admission  Hypertension -- Controlled -- Continue spironolactone 25 mg daily, metoprolol 100 mg twice daily, amlodipine 10 mg daily  Diabetes -- Continue home dose insulin, reports that his glipizide has been placed on hold in the setting of hypoglycemic episodes  Patient seen by Dr. Excell Seltzer and deemed stable for discharge home.  Follow-up arranged in the office.  New medication sent to patient's pharmacy of choice. _____________  Discharge Vitals Blood pressure (!) 140/82, pulse (!) 56, temperature 98.6 F (37 C), temperature source Oral, resp. rate 16, height 5\' 7"  (1.702 m), weight 64.7 kg, SpO2 100%.  Filed Weights   04/02/23 2154 04/03/23 1036  Weight: 66.9 kg 64.7 kg    Labs & Radiologic Studies    CBC Recent Labs    04/02/23 2217  WBC 9.4  NEUTROABS 7.1  HGB 13.4  HCT 40.0  MCV 100.0  PLT 179   Basic Metabolic Panel Recent Labs    65/78/46 2217 04/03/23 0430  NA 140 140  K 3.7 3.7  CL 104 106  CO2 24 23  GLUCOSE 121* 103*  BUN 19 19  CREATININE 1.16 0.99  CALCIUM 9.6 9.3   Liver Function Tests Recent Labs    04/02/23 2217  AST 22  ALT 17  ALKPHOS 78  BILITOT 0.8  PROT 7.1  ALBUMIN 4.4   No results for input(s): "LIPASE", "AMYLASE" in the last 72 hours. High Sensitivity Troponin:   Recent Labs  Lab 04/02/23 2217 04/03/23 0011  TROPONINIHS 13 14    BNP Invalid input(s): "POCBNP" D-Dimer No results for input(s): "DDIMER" in the last 72 hours. Hemoglobin A1C Recent Labs    04/03/23 1134  HGBA1C 6.7*   Fasting Lipid Panel Recent Labs    04/03/23 0430  CHOL 120  HDL 43  LDLCALC 67  TRIG 52  CHOLHDL 2.8   Thyroid Function Tests No results for input(s): "TSH", "T4TOTAL", "T3FREE", "THYROIDAB" in the  last 72 hours.  Invalid input(s): "FREET3" _____________  DG Chest 2 View Result Date: 04/02/2023 CLINICAL DATA:  Chest pain EXAM: CHEST - 2 VIEW COMPARISON:  03/08/2023 FINDINGS: Right-sided shunt tubing tip terminating over the right atrial region. Post sternotomy changes. Left lower chest recording device. Low lung volumes without acute airspace disease, pleural effusion or pneumothorax. Moderate air distension of the stomach. IMPRESSION: Low lung volumes without acute airspace disease. Electronically Signed   By: Jasmine Pang M.D.   On: 04/02/2023 22:45   Disposition   Pt is being discharged home today in good condition.  Follow-up Plans & Appointments     Discharge Instructions     Diet - low sodium heart healthy   Complete by: As directed    Increase activity slowly   Complete by: As directed         Discharge Medications   Allergies as of  04/03/2023       Reactions   Metoclopramide Other (See Comments)   DYSKINESIAS contractions of whole body   Zetia [ezetimibe] Diarrhea   Penicillins Swelling, Rash           Medication List     STOP taking these medications    glipiZIDE 10 MG tablet Commonly known as: GLUCOTROL   potassium chloride 10 MEQ tablet Commonly known as: KLOR-CON M       TAKE these medications    amLODipine 10 MG tablet Commonly known as: NORVASC Take 1 tablet (10 mg total) by mouth daily. Start taking on: April 04, 2023   aspirin EC 81 MG tablet Take 1 tablet (81 mg total) by mouth daily. Swallow whole.   atorvastatin 80 MG tablet Commonly known as: LIPITOR Take 80 mg by mouth at bedtime.   furosemide 40 MG tablet Commonly known as: LASIX Take 1 tablet (40 mg total) by mouth 2 (two) times daily. What changed: when to take this   gabapentin 300 MG capsule Commonly known as: NEURONTIN Take 1,200-1,500 mg by mouth See admin instructions. Take 1200 mg in the morning and 1500 mg at bedtime   isosorbide mononitrate 120 MG 24 hr  tablet Commonly known as: IMDUR Take 1 tablet (120 mg total) by mouth daily.   losartan 25 MG tablet Commonly known as: COZAAR Take 0.5 tablets (12.5 mg total) by mouth daily.   metoprolol tartrate 100 MG tablet Commonly known as: LOPRESSOR Take 1 tablet (100 mg total) by mouth 2 (two) times daily.   nitroGLYCERIN 0.4 MG SL tablet Commonly known as: NITROSTAT Place 1 tablet (0.4 mg total) under the tongue every 5 (five) minutes as needed for chest pain.   oxyCODONE-acetaminophen 10-325 MG tablet Commonly known as: PERCOCET Take 1 tablet by mouth every 4 (four) hours as needed for pain.   pantoprazole 40 MG tablet Commonly known as: PROTONIX Take 40 mg by mouth every morning.   Percogesic 12.5-325 MG Tabs Generic drug: Diphenhydramine-APAP Take 2 tablets by mouth as needed (Pain; body aches).   prasugrel 10 MG Tabs tablet Commonly known as: EFFIENT Take 1 tablet (10 mg total) by mouth every morning.   promethazine 25 MG tablet Commonly known as: PHENERGAN Take 25 mg by mouth 2 (two) times daily as needed for nausea or vomiting (Headache).   ranolazine 500 MG 12 hr tablet Commonly known as: Ranexa Take 1 tablet (500 mg total) by mouth 2 (two) times daily.   spironolactone 25 MG tablet Commonly known as: ALDACTONE Take 25 mg by mouth daily.   tiZANidine 4 MG tablet Commonly known as: ZANAFLEX Take 4-8 mg by mouth See admin instructions. Take 4 mg in the morning, lunch ,and evening meal and 8 mg at bedtime   Toujeo SoloStar 300 UNIT/ML Solostar Pen Generic drug: insulin glargine (1 Unit Dial) Inject 30 Units into the skin daily.   traZODone 50 MG tablet Commonly known as: DESYREL Take 50 mg by mouth at bedtime.          Outstanding Labs/Studies   N/a   Duration of Discharge Encounter: APP Time: 20 minutes   Signed, Laverda Page, NP 04/03/2023, 4:45 PM

## 2023-04-03 NOTE — ED Notes (Signed)
 Daughter called in, updated via phone

## 2023-04-03 NOTE — Care Management Obs Status (Signed)
 MEDICARE OBSERVATION STATUS NOTIFICATION   Patient Details  Name: Terry Nelson MRN: [id number] Date of Birth: 12-05-1955   Medicare Observation Status Notification Given:  Yes    Lawerance Sabal, RN 04/03/2023, 11:53 AM

## 2023-04-25 ENCOUNTER — Telehealth: Payer: Self-pay | Admitting: Internal Medicine

## 2023-04-25 NOTE — Telephone Encounter (Signed)
 Pt is wanting to transfer care to [facility] [place] and would like his records sent to them. Fax number is [phone].

## 2023-04-25 NOTE — Telephone Encounter (Signed)
 Pt would like a referral sent to be faxed to a Card closer to him. This is the fax number pt would like for it to be sent to [phone]

## 2023-05-02 ENCOUNTER — Other Ambulatory Visit: Payer: Self-pay | Admitting: Cardiology

## 2023-05-29 ENCOUNTER — Ambulatory Visit: Payer: Medicare Other | Admitting: Physician Assistant
